# Patient Record
Sex: Female | Born: 1950 | Race: White | Hispanic: No | Marital: Married | Smoking: Former smoker
Health system: Southern US, Community
[De-identification: ages and names within clinical notes are randomized; demographics above are authoritative.]

## PROBLEM LIST (undated history)

## (undated) DIAGNOSIS — M1990 Unspecified osteoarthritis, unspecified site: Secondary | ICD-10-CM

## (undated) DIAGNOSIS — D689 Coagulation defect, unspecified: Secondary | ICD-10-CM

## (undated) DIAGNOSIS — R42 Dizziness and giddiness: Secondary | ICD-10-CM

## (undated) DIAGNOSIS — Z9889 Other specified postprocedural states: Secondary | ICD-10-CM

## (undated) DIAGNOSIS — D071 Carcinoma in situ of vulva: Secondary | ICD-10-CM

## (undated) DIAGNOSIS — E7212 Methylenetetrahydrofolate reductase deficiency: Secondary | ICD-10-CM

## (undated) DIAGNOSIS — F419 Anxiety disorder, unspecified: Secondary | ICD-10-CM

## (undated) DIAGNOSIS — I340 Nonrheumatic mitral (valve) insufficiency: Secondary | ICD-10-CM

## (undated) DIAGNOSIS — Z87898 Personal history of other specified conditions: Secondary | ICD-10-CM

## (undated) DIAGNOSIS — G4733 Obstructive sleep apnea (adult) (pediatric): Secondary | ICD-10-CM

## (undated) DIAGNOSIS — M329 Systemic lupus erythematosus, unspecified: Secondary | ICD-10-CM

## (undated) DIAGNOSIS — K449 Diaphragmatic hernia without obstruction or gangrene: Secondary | ICD-10-CM

## (undated) DIAGNOSIS — J449 Chronic obstructive pulmonary disease, unspecified: Secondary | ICD-10-CM

## (undated) DIAGNOSIS — J4520 Mild intermittent asthma, uncomplicated: Secondary | ICD-10-CM

## (undated) DIAGNOSIS — J3089 Other allergic rhinitis: Secondary | ICD-10-CM

## (undated) DIAGNOSIS — IMO0002 Reserved for concepts with insufficient information to code with codable children: Secondary | ICD-10-CM

## (undated) DIAGNOSIS — K5730 Diverticulosis of large intestine without perforation or abscess without bleeding: Secondary | ICD-10-CM

## (undated) DIAGNOSIS — R002 Palpitations: Secondary | ICD-10-CM

## (undated) DIAGNOSIS — R7303 Prediabetes: Secondary | ICD-10-CM

## (undated) DIAGNOSIS — Z860101 Personal history of adenomatous and serrated colon polyps: Secondary | ICD-10-CM

## (undated) DIAGNOSIS — E063 Autoimmune thyroiditis: Secondary | ICD-10-CM

## (undated) DIAGNOSIS — M79604 Pain in right leg: Secondary | ICD-10-CM

## (undated) DIAGNOSIS — Z1589 Genetic susceptibility to other disease: Secondary | ICD-10-CM

## (undated) DIAGNOSIS — Z86010 Personal history of colonic polyps: Secondary | ICD-10-CM

## (undated) DIAGNOSIS — I341 Nonrheumatic mitral (valve) prolapse: Secondary | ICD-10-CM

## (undated) DIAGNOSIS — Z8719 Personal history of other diseases of the digestive system: Secondary | ICD-10-CM

## (undated) DIAGNOSIS — M79605 Pain in left leg: Secondary | ICD-10-CM

## (undated) DIAGNOSIS — G4730 Sleep apnea, unspecified: Secondary | ICD-10-CM

## (undated) DIAGNOSIS — K219 Gastro-esophageal reflux disease without esophagitis: Secondary | ICD-10-CM

## (undated) DIAGNOSIS — N393 Stress incontinence (female) (male): Secondary | ICD-10-CM

## (undated) DIAGNOSIS — Z9189 Other specified personal risk factors, not elsewhere classified: Secondary | ICD-10-CM

## (undated) DIAGNOSIS — E785 Hyperlipidemia, unspecified: Secondary | ICD-10-CM

## (undated) DIAGNOSIS — N183 Chronic kidney disease, stage 3 unspecified: Secondary | ICD-10-CM

## (undated) DIAGNOSIS — D6861 Antiphospholipid syndrome: Secondary | ICD-10-CM

## (undated) DIAGNOSIS — Z973 Presence of spectacles and contact lenses: Secondary | ICD-10-CM

## (undated) DIAGNOSIS — E039 Hypothyroidism, unspecified: Secondary | ICD-10-CM

## (undated) HISTORY — DX: Sleep apnea, unspecified: G47.30

## (undated) HISTORY — DX: Pain in right leg: M79.605

## (undated) HISTORY — DX: Autoimmune thyroiditis: E06.3

## (undated) HISTORY — DX: Chronic kidney disease, stage 3 unspecified: N18.30

## (undated) HISTORY — DX: Genetic susceptibility to other disease: Z15.89

## (undated) HISTORY — DX: Gastro-esophageal reflux disease without esophagitis: K21.9

## (undated) HISTORY — DX: Hyperlipidemia, unspecified: E78.5

## (undated) HISTORY — DX: Dizziness and giddiness: R42

## (undated) HISTORY — DX: Anxiety disorder, unspecified: F41.9

## (undated) HISTORY — DX: Nonrheumatic mitral (valve) insufficiency: I34.0

## (undated) HISTORY — DX: Reserved for concepts with insufficient information to code with codable children: IMO0002

## (undated) HISTORY — DX: Palpitations: R00.2

## (undated) HISTORY — DX: Systemic lupus erythematosus, unspecified: M32.9

## (undated) HISTORY — DX: Pain in left leg: M79.604

## (undated) HISTORY — DX: Methylenetetrahydrofolate reductase deficiency: E72.12

## (undated) HISTORY — PX: OTHER SURGICAL HISTORY: SHX169

---

## 1993-02-08 HISTORY — PX: LAPAROSCOPIC CHOLECYSTECTOMY: SUR755

## 1997-03-26 ENCOUNTER — Ambulatory Visit (HOSPITAL_COMMUNITY): Admission: RE | Admit: 1997-03-26 | Discharge: 1997-03-26 | Payer: Self-pay | Admitting: Obstetrics and Gynecology

## 1997-09-23 ENCOUNTER — Ambulatory Visit: Admission: RE | Admit: 1997-09-23 | Discharge: 1997-09-23 | Payer: Self-pay | Admitting: Family Medicine

## 1998-04-28 ENCOUNTER — Other Ambulatory Visit: Admission: RE | Admit: 1998-04-28 | Discharge: 1998-04-28 | Payer: Self-pay | Admitting: Obstetrics and Gynecology

## 1998-04-29 ENCOUNTER — Encounter: Payer: Self-pay | Admitting: Family Medicine

## 1998-04-29 ENCOUNTER — Ambulatory Visit (HOSPITAL_COMMUNITY): Admission: RE | Admit: 1998-04-29 | Discharge: 1998-04-29 | Payer: Self-pay | Admitting: Family Medicine

## 1999-02-05 ENCOUNTER — Ambulatory Visit (HOSPITAL_COMMUNITY): Admission: RE | Admit: 1999-02-05 | Discharge: 1999-02-05 | Payer: Self-pay | Admitting: Family Medicine

## 1999-02-05 ENCOUNTER — Encounter: Payer: Self-pay | Admitting: Family Medicine

## 1999-07-31 ENCOUNTER — Other Ambulatory Visit: Admission: RE | Admit: 1999-07-31 | Discharge: 1999-07-31 | Payer: Self-pay | Admitting: Obstetrics & Gynecology

## 2000-01-20 ENCOUNTER — Encounter (INDEPENDENT_AMBULATORY_CARE_PROVIDER_SITE_OTHER): Payer: Self-pay | Admitting: Specialist

## 2000-01-20 ENCOUNTER — Other Ambulatory Visit: Admission: RE | Admit: 2000-01-20 | Discharge: 2000-01-20 | Payer: Self-pay | Admitting: Gastroenterology

## 2000-06-28 ENCOUNTER — Encounter: Payer: Self-pay | Admitting: Obstetrics & Gynecology

## 2000-06-28 ENCOUNTER — Ambulatory Visit (HOSPITAL_COMMUNITY): Admission: RE | Admit: 2000-06-28 | Discharge: 2000-06-28 | Payer: Self-pay | Admitting: Obstetrics & Gynecology

## 2000-08-23 ENCOUNTER — Other Ambulatory Visit: Admission: RE | Admit: 2000-08-23 | Discharge: 2000-08-23 | Payer: Self-pay | Admitting: Obstetrics & Gynecology

## 2001-07-05 ENCOUNTER — Ambulatory Visit (HOSPITAL_COMMUNITY): Admission: RE | Admit: 2001-07-05 | Discharge: 2001-07-05 | Payer: Self-pay | Admitting: Obstetrics & Gynecology

## 2001-07-05 ENCOUNTER — Encounter: Payer: Self-pay | Admitting: Obstetrics & Gynecology

## 2002-12-14 ENCOUNTER — Ambulatory Visit (HOSPITAL_COMMUNITY): Admission: RE | Admit: 2002-12-14 | Discharge: 2002-12-14 | Payer: Self-pay | Admitting: Family Medicine

## 2003-01-21 ENCOUNTER — Other Ambulatory Visit: Admission: RE | Admit: 2003-01-21 | Discharge: 2003-01-21 | Payer: Self-pay | Admitting: Obstetrics & Gynecology

## 2004-04-02 ENCOUNTER — Other Ambulatory Visit: Admission: RE | Admit: 2004-04-02 | Discharge: 2004-04-02 | Payer: Self-pay | Admitting: Obstetrics & Gynecology

## 2004-06-24 ENCOUNTER — Ambulatory Visit (HOSPITAL_COMMUNITY): Admission: RE | Admit: 2004-06-24 | Discharge: 2004-06-24 | Payer: Self-pay | Admitting: Endocrinology

## 2006-06-28 ENCOUNTER — Encounter: Admission: RE | Admit: 2006-06-28 | Discharge: 2006-06-28 | Payer: Self-pay | Admitting: Obstetrics & Gynecology

## 2006-10-01 ENCOUNTER — Encounter: Admission: RE | Admit: 2006-10-01 | Discharge: 2006-10-01 | Payer: Self-pay | Admitting: Endocrinology

## 2008-10-23 ENCOUNTER — Encounter (INDEPENDENT_AMBULATORY_CARE_PROVIDER_SITE_OTHER): Payer: Self-pay | Admitting: *Deleted

## 2008-11-05 ENCOUNTER — Ambulatory Visit: Payer: Self-pay | Admitting: Gastroenterology

## 2008-11-26 ENCOUNTER — Ambulatory Visit: Payer: Self-pay | Admitting: Gastroenterology

## 2008-11-26 ENCOUNTER — Encounter: Payer: Self-pay | Admitting: Gastroenterology

## 2008-11-28 ENCOUNTER — Encounter: Payer: Self-pay | Admitting: Gastroenterology

## 2009-09-13 ENCOUNTER — Encounter: Admission: RE | Admit: 2009-09-13 | Discharge: 2009-09-13 | Payer: Self-pay | Admitting: Family Medicine

## 2010-01-22 ENCOUNTER — Encounter (INDEPENDENT_AMBULATORY_CARE_PROVIDER_SITE_OTHER): Payer: Self-pay | Admitting: *Deleted

## 2010-03-02 ENCOUNTER — Other Ambulatory Visit: Payer: Self-pay | Admitting: Gastroenterology

## 2010-03-02 ENCOUNTER — Ambulatory Visit
Admission: RE | Admit: 2010-03-02 | Discharge: 2010-03-02 | Payer: Self-pay | Source: Home / Self Care | Attending: Gastroenterology | Admitting: Gastroenterology

## 2010-03-02 ENCOUNTER — Encounter: Payer: Self-pay | Admitting: Gastroenterology

## 2010-03-02 DIAGNOSIS — R142 Eructation: Secondary | ICD-10-CM

## 2010-03-02 DIAGNOSIS — R141 Gas pain: Secondary | ICD-10-CM | POA: Insufficient documentation

## 2010-03-02 DIAGNOSIS — R143 Flatulence: Secondary | ICD-10-CM

## 2010-03-02 DIAGNOSIS — K219 Gastro-esophageal reflux disease without esophagitis: Secondary | ICD-10-CM | POA: Insufficient documentation

## 2010-03-02 DIAGNOSIS — R197 Diarrhea, unspecified: Secondary | ICD-10-CM | POA: Insufficient documentation

## 2010-03-02 DIAGNOSIS — L259 Unspecified contact dermatitis, unspecified cause: Secondary | ICD-10-CM | POA: Insufficient documentation

## 2010-03-02 LAB — CBC WITH DIFFERENTIAL/PLATELET
Basophils Absolute: 0 10*3/uL (ref 0.0–0.1)
Eosinophils Absolute: 0.2 10*3/uL (ref 0.0–0.7)
HCT: 45.5 % (ref 36.0–46.0)
Hemoglobin: 15.5 g/dL — ABNORMAL HIGH (ref 12.0–15.0)
Lymphocytes Relative: 39.4 % (ref 12.0–46.0)
Lymphs Abs: 3.6 10*3/uL (ref 0.7–4.0)
MCHC: 34.2 g/dL (ref 30.0–36.0)
Neutro Abs: 4.4 10*3/uL (ref 1.4–7.7)
Platelets: 268 10*3/uL (ref 150.0–400.0)
RDW: 12 % (ref 11.5–14.6)

## 2010-03-02 LAB — HEPATIC FUNCTION PANEL
AST: 29 U/L (ref 0–37)
Alkaline Phosphatase: 79 U/L (ref 39–117)
Bilirubin, Direct: 0.1 mg/dL (ref 0.0–0.3)
Total Protein: 7.2 g/dL (ref 6.0–8.3)

## 2010-03-02 LAB — TSH: TSH: 2.63 u[IU]/mL (ref 0.35–5.50)

## 2010-03-02 LAB — BASIC METABOLIC PANEL
CO2: 26 mEq/L (ref 19–32)
Calcium: 9.1 mg/dL (ref 8.4–10.5)
Glucose, Bld: 90 mg/dL (ref 70–99)
Potassium: 4 mEq/L (ref 3.5–5.1)
Sodium: 136 mEq/L (ref 135–145)

## 2010-03-05 ENCOUNTER — Encounter: Payer: Self-pay | Admitting: Gastroenterology

## 2010-03-12 NOTE — Assessment & Plan Note (Signed)
Summary: DIARRHEA..JJ.   History of Present Illness Visit Type: Follow-up Visit Primary GI MD: Elie Goody MD [facility] Primary Provider: Moriarty @ [place] Requesting Provider: Marcelle Overlie, MD Chief Complaint: dysphagia, diarrhea, GERD, abdominal pain History of Present Illness:   Rebecca Carr has multiple gastrointestinal complaints. She states she has had problems with worsening urgent, loose, watery, nonbloody stools that began shortly after her colonoscopy in 2010 which showed diverticulosis and hyperplastic colon polyps. She now has several urgent bowel movements in the mornings. She has significant bloating, generalized abdominal discomfort, flatulence, and relates intermittent difficulty swallowing bread. She states this she was placed on at least 2 courses of antibiotics at some time during 2011, but no antibiotics in the past few months. She has also noted episodes of significant morning nausea and vomiting. She has a prior history of peptic stricture and underwent endoscopy with dilation in 2002. She has not been on acid medication for several years.  She also notes a recurrent rash under her breasts and in the midline of her back that her gynecologist felt was fungal.   GI Review of Systems    Reports abdominal pain, acid reflux, belching, bloating, dysphagia with solids, nausea, and  vomiting.     Location of  Abdominal pain: generalized.    Denies chest pain, dysphagia with liquids, heartburn, loss of appetite, vomiting blood, weight loss, and  weight gain.      Reports change in bowel habits and  diarrhea.     Denies anal fissure, black tarry stools, constipation, diverticulosis, fecal incontinence, heme positive stool, hemorrhoids, irritable bowel syndrome, jaundice, light color stool, liver problems, rectal bleeding, and  rectal pain.   Current Medications (verified): 1)  Levothyroxine Sodium 137 Mcg Tabs (Levothyroxine Sodium) .... Take One By Mouth Once Daily 2)  Aspirin  81 Mg Tbec (Aspirin) .... Take One By Mouth Once Daily 3)  Ipratropium Bromide 0.03 % Soln (Ipratropium Bromide) .Marland Kitchen.. 1 Puff Each Nostril Two Times A Day 4)  Boniva 150 Mg Tabs (Ibandronate Sodium) .... Once Monthly 5)  Simcor 500-40 Mg Xr24h-Tab (Niacin-Simvastatin) .... Once Daily  Allergies (verified): 1)  ! 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 Family History of Colon Cancer: father ? Family History of Pancreatic Cancer:? Family History of Diabetes:  Family History of Heart Disease:  Family History of Colon Polyps  Social History: Reviewed history and no changes required. Patient currently smokes. 1 ppd Illicit Drug Use - no Alcohol Use - yes 1 per day Married Occupation: Mining engineer Daily Caffeine Use 4 per day Smoking Status:  current Drug Use:  no  Review of Systems       The patient complains of allergy/sinus, change in vision, cough, headaches-new, heart rhythm changes, itching, muscle pains/cramps, skin rash, and swelling of feet/legs.         The pertinent positives and negatives are noted as above and in the HPI. All other ROS were reviewed and were negative.   Vital Signs:  Patient profile:   60 year old female Height:      67 inches Weight:      245.13 pounds BMI:     38.53 Pulse rate:   60 / minute Pulse rhythm:   regular BP sitting:   110 / 78  (right arm) Cuff size:  regular  Vitals Entered By: June McMurray CMA Duncan Dull) (March 02, 2010 3:59 PM)  Physical Exam  General:  Well developed, well nourished, no acute distress. obese.  obese.   Head:  Normocephalic and atraumatic. Eyes:  PERRLA, no icterus. Ears:  Normal auditory acuity. Mouth:  No deformity or lesions, dentition normal. Neck:   Supple; no masses or thyromegaly. Lungs:  Clear throughout to auscultation. Heart:  Regular rate and rhythm; no murmurs, rubs,  or bruits. Abdomen:  Soft, nontender and nondistended. No masses, hepatosplenomegaly or hernias noted. Normal bowel sounds. Msk:  Symmetrical with no gross deformities. Normal posture. Pulses:  Normal pulses noted. Extremities:  No clubbing, cyanosis, edema or deformities noted. Neurologic:  Alert and  oriented x4;  grossly normal neurologically. Skin:  Resolving macular papular midline rash on her back Cervical Nodes:  No significant cervical adenopathy. Inguinal Nodes:  No significant inguinal adenopathy. Psych:  Alert and cooperative. anxious.  anxious.    Impression & Recommendations:  Problem # 1:  GERD (ICD-530.81) Underlying GERD with a prior esophageal stricture, and erosive esophagitis, now with several symptoms likely attributable to uncontrolled reflux. Begin a proton pump inhibitor every morning, along with standard antireflux measures. Long-term weight-loss program advised.  Problem # 2:  DIARRHEA (ICD-787.91) Urgent morning, diarrhea. Rule out celiac disease, inflammatory bowel disease, irritable bowel syndrome, chronic intestinal infections, and other disorders. Blood work and stool studies negative, consider trial of anti-spasmodic. Consider colonoscopy with biopsies. Orders: TLB-BMP (Basic Metabolic Panel-BMET) (80048-METABOL) TLB-Hepatic/Liver Function Pnl (80076-HEPATIC) TLB-CBC Platelet - w/Differential (85025-CBCD) TLB-TSH (Thyroid Stimulating Hormone) (84443-TSH) TLB-IgA (Immunoglobulin A) (82784-IGA) T-Sprue Panel (Celiac Disease Aby Eval) (83516x3/[address]) T-Culture, Stool (87045/87046-70140) T-C diff by PCR (04540) T-Stool Fats [place] Stain [id number]) T-Stool Giardia / Crypto- EIA (95621) T-Stool for O&P (30865-78469) T-Fecal WBC (62952-84132)  Problem # 3:  DERMATITIS, CHRONIC (ICD-692.9) Etiology unclear. May need further  evaluation with a dermatologist.  Problem # 4:  FLATULENCE ERUCTATION AND GAS PAIN (ICD-787.3) Begin a low gas diet, and Gas-X q.i.d. p.r.n. Further plans as above.  Patient Instructions: 1)  Please go directly to the basement to have your labs drawn.  2)  Pick up your prescription for pantoprazole from your pharmacy.  3)  Avoid foods high in acid content ( tomatoes, citrus juices, spicy foods) . Avoid eating within 3 to 4 hours of lying down or before exercising. Do not over eat; try smaller more frequent meals. Elevate head of bed four inches when sleeping.  4)  Excessive Gas Diet handout given.  5)  Start Gas-X four times a day as needed. 6)  Please schedule a follow-up appointment in 3-4 weeks.  7)  Copy sent to : Marcelle Overlie, MD 8)  The medication list was reviewed and reconciled.  All changed / newly prescribed medications were explained.  A complete medication list was provided to the patient / caregiver.  Prescriptions: PANTOPRAZOLE SODIUM 40 MG TBEC (PANTOPRAZOLE SODIUM) one tablet by mouth once daily  #30 x 11   Entered by:   Christie Nottingham CMA (AAMA)   Authorized by:   Meryl Dare MD [facility]   Signed by:   Meryl Dare MD [facility] on 03/02/2010   Method used:   Electronically to        [facility]* (retail)       [address] [place], [place]  [address]       Ph: [phone]       Fax:  [phone]   RxID:   [id number]

## 2010-03-12 NOTE — Letter (Signed)
Summary: New Patient letter  [facility]  [address]   [place], [place] [address]   Phone: [phone]  Fax: [phone]       01/22/2010 MRN: [id number]  Rebecca Carr [address] [place], [place]  [address]  [place]  Dear Ms. Val,  Welcome to the Gastroenterology Division at [facility].    You are scheduled to see Dr.  Russella Dar  on 03/04/2010 at 3:30 on the 3rd floor at [facility], [address]  We ask that you try to arrive at our office 15 minutes prior to your appointment time to allow for check-in.  We would like you to complete the enclosed self-administered evaluation form prior to your visit and bring it with you on the day of your appointment.  We will review it with you.  Also, please bring a complete list of all your medications or, if you prefer, bring the medication bottles and we will list them.  Please bring your insurance card so that we may make a copy of it.  If your insurance requires a referral to see a specialist, please bring your referral form from your primary care physician.  Co-payments are due at the time of your visit and may be paid by cash, check or credit card.     Your office visit will consist of a consult with your physician (includes a physical exam), any laboratory testing he/she may order, scheduling of any necessary diagnostic testing (e.g. x-ray, ultrasound, CT-scan), and scheduling of a procedure (e.g. Endoscopy, Colonoscopy) if required.  Please allow enough time on your schedule to allow for any/all of these possibilities.    If you cannot keep your appointment, please call [phone] to cancel or reschedule prior to your appointment date.  This allows [place] the opportunity to schedule an appointment for another patient in need of care.  If you do not cancel or reschedule by 5 p.m. the business day prior to your appointment date, you will be charged a $50.00 late cancellation/no-show fee.    Thank you for  choosing [facility] for your medical needs.  We appreciate the opportunity to care for you.  Please visit [place] at our website  to learn more about our practice.                     Sincerely,                                                             The Gastroenterology Division

## 2010-11-09 HISTORY — PX: TRANSTHORACIC ECHOCARDIOGRAM: SHX275

## 2013-05-03 ENCOUNTER — Other Ambulatory Visit: Payer: Self-pay | Admitting: Obstetrics and Gynecology

## 2013-08-17 ENCOUNTER — Telehealth: Payer: Self-pay | Admitting: Gastroenterology

## 2013-08-17 NOTE — Telephone Encounter (Signed)
Patient with diarrhea, she does not want to wait until September to be seen with Dr. Fuller Plan.  She will come in and see Nicoletta Ba PA 08/28/13 10:00

## 2013-08-28 ENCOUNTER — Encounter: Payer: Self-pay | Admitting: Physician Assistant

## 2013-08-28 ENCOUNTER — Ambulatory Visit (INDEPENDENT_AMBULATORY_CARE_PROVIDER_SITE_OTHER): Payer: BC Managed Care – PPO | Admitting: Physician Assistant

## 2013-08-28 VITALS — BP 120/82 | HR 92 | Ht 67.75 in | Wt 249.0 lb

## 2013-08-28 DIAGNOSIS — R197 Diarrhea, unspecified: Secondary | ICD-10-CM

## 2013-08-28 DIAGNOSIS — K219 Gastro-esophageal reflux disease without esophagitis: Secondary | ICD-10-CM

## 2013-08-28 DIAGNOSIS — R490 Dysphonia: Secondary | ICD-10-CM

## 2013-08-28 DIAGNOSIS — Z86010 Personal history of colonic polyps: Secondary | ICD-10-CM

## 2013-08-28 MED ORDER — HYOSCYAMINE SULFATE 0.125 MG SL SUBL: 0.1250 mg | SUBLINGUAL_TABLET | SUBLINGUAL | Status: DC | PRN

## 2013-08-28 MED ORDER — MOVIPREP 100 G PO SOLR: 1.0000 | ORAL | Status: DC

## 2013-08-28 MED ORDER — FAMOTIDINE 40 MG PO TABS: ORAL_TABLET | ORAL | Status: DC

## 2013-08-28 NOTE — Progress Notes (Signed)
Subjective:    Patient ID: Rebecca Carr, female    DOB: August 04, 1950, 63 y.o.   MRN: [id number]  HPI;    Rebecca Carr is a pleasant 63 year old female known previously to Dr. Fuller Plan from colonoscopy done in 2010. She also reports that she had one other colonoscopy several years back. Colonoscopy in October of 2010 showed mild diverticulosis and she had 5 polyps removed all 3-5 mm in size and path consistent with hyperplastic polyps. She does have family history of colon cancer in her father and grandfather and was therefore scheduled for 5 year followup. She comes in today with new complaint of diarrhea which she says is been present over the past 5 months. She has diarrhea on a daily basis which is manifested with early morning urgency for  bowel movements to the point where she has had a couple of accidents. She says she has  2-3 episodes of diarrhea early in the morning and then generally is okay for the rest of the day. She has had some dark bowel movements and some very light bowel movements , no overt blood.. She says her stool is always liquid to loose. She's had no associated abdominal pain ,no bloating, no nausea or vomiting, no fever. Her appetite has been fine. No weight loss. No recent antibiotics. She had been on several supplements given to her by a Naturopath  for her thyroid but says she quit all of these at least 5 months ago. No specific triggers that she is aware of. She is also concerned about recurrent hoarseness. She had been on PPI therapy in the past and said that did seem to help but she has not been taking this for a long time because of concerns for osteoporosis. She says she does have very frequent episodes of belching and burping and what sounds like sour brash. No complaints of dysphagia.   Review of Systems  Constitutional: Positive for unexpected weight change.  HENT: Positive for voice change.   Eyes: Negative.   Respiratory: Negative.   Cardiovascular: Negative.     Gastrointestinal: Positive for diarrhea.  Endocrine: Negative.   Genitourinary: Negative.   Musculoskeletal: Positive for arthralgias.  Allergic/Immunologic: Negative.   Neurological: Negative.   Hematological: Negative.   Psychiatric/Behavioral: Negative.     Outpatient Encounter Prescriptions as of 08/28/2013  Medication Sig  . albuterol (PROVENTIL) (2.5 MG/3ML) 0.083% nebulizer solution Take 2.5 mg by nebulization every 6 (six) hours as needed for wheezing or shortness of breath.  Francia Greaves THYROID PO Take 1 tablet by mouth daily.  . fluticasone (FLONASE) 50 MCG/ACT nasal spray Place 1 spray into both nostrils daily.  . famotidine (PEPCID) 40 MG tablet Take 1 tab with dinner.  . hyoscyamine (LEVSIN/SL) 0.125 MG SL tablet Place 1 tablet (0.125 mg total) under the tongue every 4 (four) hours as needed.  Marland Kitchen MOVIPREP 100 G SOLR Take 1 kit (200 g total) by mouth as directed.   Allergies  Allergen Reactions  . Iodine     REACTION: anaphylactic shock  . Iohexol      Desc: Patient states she is allergic to iodine and had a "code blue" incident after an injection for some type of imaging study.    Patient Active Problem List   Diagnosis Date Noted  . GERD 03/02/2010  . DERMATITIS, CHRONIC 03/02/2010  . FLATULENCE ERUCTATION AND GAS PAIN 03/02/2010  . DIARRHEA 03/02/2010    History   Social History Narrative  . No narrative on file  family history includes COPD in her brother; Heart attack in her brother and mother; Pancreatic cancer in her father.  Objective:   Physical Exam  well-developed white female in no acute distress, blood pressure 120 over each 2 pulse 92 height 5 foot 7 weight 249, BMI 38. HEENT; nontraumatic normocephalic EOMI PERRLA sclera anicteric, Supple; no JVD, Cardiovascular; regular rate and rhythm with S1-S2 no murmur or gallop, Pulmonary; clear bilaterally, Abdomen ;soft basically nontender nondistended bowel sounds are active there is no palpable mass or  hepatosplenomegaly no guarding or rebound, Rectal; exam not done, Extremities; no clubbing cyanosis or edema skin warm and dry, Psych; mood and affect appropriate.        Assessment & Plan:  #19  63 year old female with new complaint of diarrhea at x5 months, etiology not clear diarrhea associated with urgency. Rule out new onset colitis, microscopic colitis or IBS #2 personal history of hyperplastic colon polyps, and adenomatous polyps -last colonoscopy 5 years ago #3 diverticulosis #4 positive family history of colon cancer #5 hoarseness suspect underlying GERD #6 persistent belching and sour brash  Plan; start Pepcid 40 mg by mouth daily Trial of Levsin sublingual every morning. Patient advised to take early in the morning shortly after getting up Schedule for colonoscopy with plans for random biopsies with Dr. Fuller Plan for further evaluation of her diarrhea and also because she is due for 5 year followup due to her family history. Procedure discussed in detail with the patient and she is agreeable to proceed Schedule for EGD with Dr. Fuller Plan. Procedure discussed in detail with the patient and she is agreeable to proceed.

## 2013-08-28 NOTE — Patient Instructions (Signed)
You have been scheduled for an endoscopy and colonoscopy. Please follow the written instructions given to you at your visit today. Please pick up your prep at the pharmacy within the next 1-3 days. [facility] [place]. If you use inhalers (even only as needed), please bring them with you on the day of your procedure. Your physician has requested that you go to www.startemmi.com and enter the access code given to you at your visit today. This web site gives a general overview about your procedure. However, you should still follow specific instructions given to you by our office regarding your preparation for the procedure.

## 2013-08-28 NOTE — Progress Notes (Signed)
Reviewed and agree with management plan.  Gradie Butrick T. Roselina Burgueno, MD FACG 

## 2013-09-20 ENCOUNTER — Encounter: Payer: Self-pay | Admitting: Gastroenterology

## 2013-09-25 ENCOUNTER — Telehealth: Payer: Self-pay | Admitting: Gastroenterology

## 2013-09-25 NOTE — Telephone Encounter (Signed)
Patient notified of recommendations. 

## 2013-09-25 NOTE — Telephone Encounter (Signed)
Patient has mitral valve prolapse and was concerned that she needed antibiotics prior to procedure. Told her this is not recommended for endo procedures. She also reports she has a sinus infection and is concerned about being on antibiotics and having the procedure. Informed patient that if she has fever, the procedures would need to be rescheduled. She just wants Dr. Fuller Plan to know she is going to be on antibiotics.

## 2013-09-25 NOTE — Telephone Encounter (Signed)
No antibiotics needed for MVP with her procedure. OK to be on antibiotics for a sinus infection for her procedure.

## 2013-10-04 ENCOUNTER — Encounter: Payer: Self-pay | Admitting: Gastroenterology

## 2013-10-04 ENCOUNTER — Encounter: Payer: BC Managed Care – PPO | Admitting: Gastroenterology

## 2013-10-04 ENCOUNTER — Ambulatory Visit (AMBULATORY_SURGERY_CENTER): Payer: BC Managed Care – PPO | Admitting: Gastroenterology

## 2013-10-04 ENCOUNTER — Other Ambulatory Visit: Payer: Self-pay | Admitting: Gastroenterology

## 2013-10-04 VITALS — BP 118/57 | HR 82 | Temp 98.5°F | Resp 24 | Ht 67.75 in | Wt 249.0 lb

## 2013-10-04 DIAGNOSIS — IMO0001 Reserved for inherently not codable concepts without codable children: Secondary | ICD-10-CM

## 2013-10-04 DIAGNOSIS — D125 Benign neoplasm of sigmoid colon: Secondary | ICD-10-CM

## 2013-10-04 DIAGNOSIS — K2970 Gastritis, unspecified, without bleeding: Secondary | ICD-10-CM

## 2013-10-04 DIAGNOSIS — R197 Diarrhea, unspecified: Secondary | ICD-10-CM

## 2013-10-04 DIAGNOSIS — D126 Benign neoplasm of colon, unspecified: Secondary | ICD-10-CM

## 2013-10-04 DIAGNOSIS — K2990 Gastroduodenitis, unspecified, without bleeding: Secondary | ICD-10-CM

## 2013-10-04 DIAGNOSIS — Z86010 Personal history of colonic polyps: Secondary | ICD-10-CM

## 2013-10-04 DIAGNOSIS — K219 Gastro-esophageal reflux disease without esophagitis: Secondary | ICD-10-CM

## 2013-10-04 DIAGNOSIS — Z800 Family history of malignant neoplasm of digestive organs: Secondary | ICD-10-CM

## 2013-10-04 HISTORY — PX: COLONOSCOPY, ESOPHAGOGASTRODUODENOSCOPY (EGD) AND ESOPHAGEAL DILATION: SHX5781

## 2013-10-04 MED ORDER — OMEPRAZOLE 40 MG PO CPDR: 40.0000 mg | DELAYED_RELEASE_CAPSULE | Freq: Every day | ORAL | Status: DC

## 2013-10-04 MED ORDER — SODIUM CHLORIDE 0.9 % IV SOLN: 500.0000 mL | INTRAVENOUS | Status: DC

## 2013-10-04 NOTE — Progress Notes (Signed)
Called to room to assist during endoscopic procedure.  Patient ID and intended procedure confirmed with present staff. Received instructions for my participation in the procedure from the performing physician.  

## 2013-10-04 NOTE — Progress Notes (Signed)
A/ox3 pleased with MAC, report to Celia RN 

## 2013-10-04 NOTE — Op Note (Signed)
[place]  [address] [place], [address]   COLONOSCOPY PROCEDURE REPORT  PATIENT: Rebecca Carr, Rebecca Carr  MR#: [id number] BIRTHDATE: 20-Apr-1950 , 63  yrs. old GENDER: Female ENDOSCOPIST: Ladene Artist, MD, [facility] PROCEDURE DATE:  10/04/2013 PROCEDURE:   Colonoscopy with biopsy First Screening Colonoscopy - Avg.  risk and is 50 yrs.  old or older - No.  Prior Negative Screening - Now for repeat screening. N/A  History of Adenoma - Now for follow-up colonoscopy & has been > or = to 3 yrs.  Yes hx of adenoma.  Has been 3 or more years since last colonoscopy.  Polyps Removed Today? Yes. ASA CLASS:   Class II INDICATIONS:chronic diarrhea, Patient's personal history of adenomatous colon polyps, and Patient's immediate family history of colon cancer. MEDICATIONS: MAC sedation, administered by CRNA and propofol (Diprivan) 300mg  IV DESCRIPTION OF PROCEDURE:   After the risks benefits and alternatives of the procedure were thoroughly explained, informed consent was obtained.  A digital rectal exam revealed no abnormalities of the rectum.   The LB [id number]  endoscope was introduced through the anus and advanced to the cecum, which was identified by both the appendix and ileocecal valve. No adverse events experienced.   The quality of the prep was good, using MoviPrep  The instrument was then slowly withdrawn as the colon was fully examined.  COLON FINDINGS: Moderate diverticulosis was noted in the descending colon and sigmoid colon.   A sessile polyp measuring 4 mm in size was found in the sigmoid colon.  A polypectomy was performed with cold forceps.  The resection was complete and the polyp tissue was completely retrieved.   The colon was otherwise normal.  There was no diverticulosis, inflammation, polyps or cancers unless previously stated. Random biopsies taken throughout the colon. Retroflexed views revealed no abnormalities. The time to cecum=1 minutes 39  seconds.  Withdrawal time=12 minutes 32 seconds.  The scope was withdrawn and the procedure completed. COMPLICATIONS: There were no complications.  ENDOSCOPIC IMPRESSION: 1.   Moderate diverticulosis was noted in the descending colon and sigmoid colon 2.   Sessile polyp measuring 4 mm in the sigmoid colon; polypectomy performed with cold forceps  RECOMMENDATIONS: 1.  Await pathology results 2.  Repeat Colonoscopy in 5 years.  eSigned:  Ladene Artist, MD, [facility] 10/04/2013 1:37 PM

## 2013-10-04 NOTE — Patient Instructions (Signed)
Discharge instructions given with verbal understanding. Handouts on polyps,gastritis and esophagitis. Resume previous medications. Office will call to schedule follow up appointment. YOU HAD AN ENDOSCOPIC PROCEDURE TODAY AT [facility]: Refer to the procedure report that was given to you for any specific questions about what was found during the examination.  If the procedure report does not answer your questions, please call your gastroenterologist to clarify.  If you requested that your care partner not be given the details of your procedure findings, then the procedure report has been included in a sealed envelope for you to review at your convenience later.  YOU SHOULD EXPECT: Some feelings of bloating in the abdomen. Passage of more gas than usual.  Walking can help get rid of the air that was put into your GI tract during the procedure and reduce the bloating. If you had a lower endoscopy (such as a colonoscopy or flexible sigmoidoscopy) you may notice spotting of blood in your stool or on the toilet paper. If you underwent a bowel prep for your procedure, then you may not have a normal bowel movement for a few days.  DIET: Your first meal following the procedure should be a light meal and then it is ok to progress to your normal diet.  A half-sandwich or bowl of soup is an example of a good first meal.  Heavy or fried foods are harder to digest and may make you feel nauseous or bloated.  Likewise meals heavy in dairy and vegetables can cause extra gas to form and this can also increase the bloating.  Drink plenty of fluids but you should avoid alcoholic beverages for 24 hours.  ACTIVITY: Your care partner should take you home directly after the procedure.  You should plan to take it easy, moving slowly for the rest of the day.  You can resume normal activity the day after the procedure however you should NOT DRIVE or use heavy machinery for 24 hours (because of the sedation  medicines used during the test).    SYMPTOMS TO REPORT IMMEDIATELY: A gastroenterologist can be reached at any hour.  During normal business hours, 8:30 AM to 5:00 PM Monday through Friday, call [phone].  After hours and on weekends, please call the GI answering service at [phone] who will take a message and have the physician on call contact you.   Following lower endoscopy (colonoscopy or flexible sigmoidoscopy):  Excessive amounts of blood in the stool  Significant tenderness or worsening of abdominal pains  Swelling of the abdomen that is new, acute  Fever of 100F or higher  Following upper endoscopy (EGD)  Vomiting of blood or coffee ground material  New chest pain or pain under the shoulder blades  Painful or persistently difficult swallowing  New shortness of breath  Fever of 100F or higher  Black, tarry-looking stools  FOLLOW UP: If any biopsies were taken you will be contacted by phone or by letter within the next 1-3 weeks.  Call your gastroenterologist if you have not heard about the biopsies in 3 weeks.  Our staff will call the home number listed on your records the next business day following your procedure to check on you and address any questions or concerns that you may have at that time regarding the information given to you following your procedure. This is a courtesy call and so if there is no answer at the home number and we have not heard from you through the emergency physician  on call, we will assume that you have returned to your regular daily activities without incident.  SIGNATURES/CONFIDENTIALITY: You and/or your care partner have signed paperwork which will be entered into your electronic medical record.  These signatures attest to the fact that that the information above on your After Visit Summary has been reviewed and is understood.  Full responsibility of the confidentiality of this discharge information lies with you and/or your  care-partner.

## 2013-10-04 NOTE — Op Note (Signed)
[place]  [address] [place], [id number]   ENDOSCOPY PROCEDURE REPORT  PATIENT: Rebecca Carr, Rebecca Carr  MR#: [id number] BIRTHDATE: [birth year] , 63  yrs. old GENDER: Female ENDOSCOPIST: Ladene Artist, MD, [facility] PROCEDURE DATE:  10/04/2013 PROCEDURE:  EGD w/ biopsy ASA CLASS:     Class II INDICATIONS:  History of esophageal reflux. MEDICATIONS: MAC sedation, administered by CRNA, There was residual sedation effect present from prior procedure, and propofol (Diprivan) 100mg  IV TOPICAL ANESTHETIC: none DESCRIPTION OF PROCEDURE: After the risks benefits and alternatives of the procedure were thoroughly explained, informed consent was obtained.  The LB [id number] endoscope was introduced through the mouth and advanced to the second portion of the duodenum. Without limitations.  The instrument was slowly withdrawn as the mucosa was fully examined.  ESOPHAGUS: There was LA Class B esophagitis noted.   A stricture was found at the gastroesophageal junction.  The stenosis was traversable with the endoscope.   The esophagus was otherwise normal. STOMACH: Mild nonerosive gastritis was found in the gastric antrum, gastric body, and gastric fundus.  Multiple biopsies were performed.   The stomach otherwise appeared normal. DUODENUM: Mild duodenal inflammation was found in the bulb and second portion of the duodenum.  Retroflexed views revealed a small hiatal hernia.     The scope was then withdrawn from the patient and the procedure completed.  COMPLICATIONS: There were no complications.  ENDOSCOPIC IMPRESSION: 1.   LA Class B esophagitis 2.   Stricture at the gastroesophageal junction 3.   Gastritis in the gastric antrum, gastric body, and gastric fundus; multiple biopsies 4.   Duodenitis in the bulb and second portion of the duodenum 5.   Small hiatal  RECOMMENDATIONS: 1.  Anti-reflux regimen long term 2.  PPI qam: omeprazole 40 mg po qam, 1 year of  refills 3.  Await pathology results 4.  Avoid NSAIDs 5.  Office appt with me or Amy Esterwood, PAC in 4-6 weeks  eSigned:  Ladene Artist, MD, [facility] 10/04/2013 1:47 PM

## 2013-10-05 ENCOUNTER — Telehealth: Payer: Self-pay | Admitting: *Deleted

## 2013-10-05 NOTE — Telephone Encounter (Signed)
  Follow up Call-  Call back number 10/04/2013  Post procedure Call Back phone  # [phone]  Permission to leave phone message Yes     No answer at # given.  Left message on voicemail.

## 2013-10-11 ENCOUNTER — Encounter: Payer: Self-pay | Admitting: Gastroenterology

## 2013-10-22 ENCOUNTER — Telehealth: Payer: Self-pay | Admitting: Gastroenterology

## 2013-10-22 DIAGNOSIS — K219 Gastro-esophageal reflux disease without esophagitis: Principal | ICD-10-CM

## 2013-10-22 DIAGNOSIS — IMO0001 Reserved for inherently not codable concepts without codable children: Secondary | ICD-10-CM

## 2013-10-22 MED ORDER — PANTOPRAZOLE SODIUM 40 MG PO TBEC: 40.0000 mg | DELAYED_RELEASE_TABLET | Freq: Every day | ORAL | Status: DC

## 2013-10-22 NOTE — Telephone Encounter (Signed)
Patient reports indigestion since starting on omeprazole.  Will try pantoprazole.  She will call back for additional questions or concerns

## 2013-11-19 ENCOUNTER — Encounter: Payer: Self-pay | Admitting: Gastroenterology

## 2013-11-19 ENCOUNTER — Ambulatory Visit (INDEPENDENT_AMBULATORY_CARE_PROVIDER_SITE_OTHER): Payer: BC Managed Care – PPO | Admitting: Gastroenterology

## 2013-11-19 VITALS — BP 108/80 | HR 80 | Ht 67.5 in | Wt 251.0 lb

## 2013-11-19 DIAGNOSIS — K2100 Gastro-esophageal reflux disease with esophagitis, without bleeding: Secondary | ICD-10-CM

## 2013-11-19 DIAGNOSIS — Z86010 Personal history of colonic polyps: Secondary | ICD-10-CM

## 2013-11-19 DIAGNOSIS — R490 Dysphonia: Secondary | ICD-10-CM

## 2013-11-19 NOTE — Progress Notes (Signed)
    History of Present Illness: This is a 63 year old female returning for followup of erosive esophagitis. She underwent colonoscopy and upper endoscopy in late August. Procedures reviewed. She has had a few episodes of reflux and regurgitation and feels omeprazole once daily is controlling his symptoms. She has significant problems with sinus congestion, sneezing, coughing, allergies and hoarseness. She feels her hoarseness improves when her sinus symptoms and allergies are under better control.  Current Medications, Allergies, Past Medical History, Past Surgical History, Family History and Social History were reviewed in [employer] record.  Physical Exam: General: Well developed , well nourished, no acute distress Head: Normocephalic and atraumatic Eyes:  sclerae anicteric, EOMI Ears: Normal auditory acuity Mouth: No deformity or lesions Lungs: Clear throughout to auscultation Heart: Regular rate and rhythm; no murmurs, rubs or bruits Abdomen: Soft, non tender and non distended. No masses, hepatosplenomegaly or hernias noted. Normal Bowel sounds Musculoskeletal: Symmetrical with no gross deformities  Pulses:  Normal pulses noted Extremities: No clubbing, cyanosis, edema or deformities noted Neurological: Alert oriented x 4, grossly nonfocal Psychological:  Alert and cooperative. Normal mood and affect  Assessment and Recommendations:  1. LA Class Grade B erosive esophagitis. Continue omeprazole 40 mg daily and standard antireflux measures. I asked her to contact her primary physician about an ENT and/or allergy referral. If her hoarseness persists despite improved management of her sinus and allergy symptoms consider increasing her PPIs twice daily.    2. Personal history of adenomatous colon polyps. 5 year surveillance colonoscopy.

## 2013-11-19 NOTE — Patient Instructions (Addendum)
You have been given a separate informational sheet regarding your tobacco use, the importance of quitting and local resources to help you quit.  As discussed with Dr. Fuller Plan, please contact your primary care doctor about a referral to an ENT.  Some of our suggestions include Dr. Janace Hoard and Dr. Constance Holster at [facility].  You have been given some anti reflux precaution information.  Please follow up with Dr. Fuller Plan in one year  cc:  Betty Martinique, MD

## 2014-05-14 ENCOUNTER — Other Ambulatory Visit: Payer: Self-pay | Admitting: Obstetrics and Gynecology

## 2014-05-16 LAB — CYTOLOGY - PAP

## 2014-07-05 ENCOUNTER — Encounter: Payer: Self-pay | Admitting: Gastroenterology

## 2014-10-04 ENCOUNTER — Other Ambulatory Visit: Payer: Self-pay | Admitting: Gastroenterology

## 2015-01-06 ENCOUNTER — Other Ambulatory Visit: Payer: Self-pay | Admitting: Gastroenterology

## 2015-05-31 ENCOUNTER — Other Ambulatory Visit: Payer: Self-pay | Admitting: Family Medicine

## 2015-06-30 ENCOUNTER — Ambulatory Visit: Payer: BLUE CROSS/BLUE SHIELD | Attending: Gynecologic Oncology | Admitting: Gynecologic Oncology

## 2015-06-30 ENCOUNTER — Encounter: Payer: Self-pay | Admitting: Gynecologic Oncology

## 2015-06-30 ENCOUNTER — Encounter (HOSPITAL_BASED_OUTPATIENT_CLINIC_OR_DEPARTMENT_OTHER): Payer: Self-pay | Admitting: *Deleted

## 2015-06-30 VITALS — BP 118/59 | HR 80 | Temp 98.0°F | Resp 17 | Ht 67.75 in | Wt 223.4 lb

## 2015-06-30 DIAGNOSIS — Z880 Allergy status to penicillin: Secondary | ICD-10-CM | POA: Insufficient documentation

## 2015-06-30 DIAGNOSIS — D071 Carcinoma in situ of vulva: Secondary | ICD-10-CM | POA: Diagnosis not present

## 2015-06-30 DIAGNOSIS — K222 Esophageal obstruction: Secondary | ICD-10-CM | POA: Diagnosis not present

## 2015-06-30 DIAGNOSIS — Z87891 Personal history of nicotine dependence: Secondary | ICD-10-CM | POA: Diagnosis not present

## 2015-06-30 DIAGNOSIS — K209 Esophagitis, unspecified: Secondary | ICD-10-CM | POA: Insufficient documentation

## 2015-06-30 DIAGNOSIS — Z9049 Acquired absence of other specified parts of digestive tract: Secondary | ICD-10-CM | POA: Diagnosis not present

## 2015-06-30 DIAGNOSIS — D497 Neoplasm of unspecified behavior of endocrine glands and other parts of nervous system: Secondary | ICD-10-CM | POA: Diagnosis not present

## 2015-06-30 DIAGNOSIS — F419 Anxiety disorder, unspecified: Secondary | ICD-10-CM | POA: Insufficient documentation

## 2015-06-30 DIAGNOSIS — E785 Hyperlipidemia, unspecified: Secondary | ICD-10-CM | POA: Insufficient documentation

## 2015-06-30 DIAGNOSIS — Z9189 Other specified personal risk factors, not elsewhere classified: Secondary | ICD-10-CM

## 2015-06-30 DIAGNOSIS — G4730 Sleep apnea, unspecified: Secondary | ICD-10-CM | POA: Diagnosis not present

## 2015-06-30 DIAGNOSIS — J45909 Unspecified asthma, uncomplicated: Secondary | ICD-10-CM | POA: Diagnosis not present

## 2015-06-30 DIAGNOSIS — Z91B Personal risk factor of exposure to diethylstilbestrol: Secondary | ICD-10-CM | POA: Insufficient documentation

## 2015-06-30 DIAGNOSIS — Z7982 Long term (current) use of aspirin: Secondary | ICD-10-CM | POA: Insufficient documentation

## 2015-06-30 DIAGNOSIS — E039 Hypothyroidism, unspecified: Secondary | ICD-10-CM | POA: Insufficient documentation

## 2015-06-30 DIAGNOSIS — D649 Anemia, unspecified: Secondary | ICD-10-CM | POA: Insufficient documentation

## 2015-06-30 DIAGNOSIS — K219 Gastro-esophageal reflux disease without esophagitis: Secondary | ICD-10-CM | POA: Diagnosis not present

## 2015-06-30 NOTE — Progress Notes (Signed)
NPO AFTER MN. ARRIVE AT 0915.  NEEDS ISTAT AND EKG.  WILL TAKE ARMOUR THYROID AM DOS W/ SIPS OF WATER.

## 2015-06-30 NOTE — Progress Notes (Signed)
Consult Note: Gyn-Onc  Consult was requested by Dr. Helane Rima for the evaluation of Rebecca Carr 65 y.o. female  CC:  Chief Complaint  Patient presents with  . VIN3    Assessment/Plan:  Ms. Rebecca Carr  is a 65 y.o.  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  I discussed with Edd Fabian that the pathology is consistent with a premalignant lesion and that there are no apparent sites more concerning for invasive disease.  I discussed that VIN3 can be treated either with excision or ablation. Ablation runs the risk of missed occult invasive disease as it does not produce pathology. However, ablation allows for sparing of vital structures (in this case clitoris).  After hearing about her options she has elected for CO2 laser of the vulva. We discussed the procedure, its risks and postop care and recovery.  I discussed that there is a 25% risk for recurrence after treatment of VIN3.  She requires annual surveillance for DES exposure and increased risk for adenocarcinoma and clear cell of the upper vagina and cervix.   HPI: Rebecca Carr is a 65 year old G1P1 who is seen in consultation at the request of Dr Helane Rima for [facility].    The patient first noted vulvar pruritis and pain at the clitoris in 2015. She discussed this with Dr Helane Rima who thought she might have vulvar atrophy and prescribed a cream (possibly premarin). One year later at her annual visit she had persistent symptoms that she mentioned, and Dr Helane Rima felt this might be vulvar candidiasis and prescribed a course of anti-fungal. This did not ameliorate the symptoms. 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  The patient has a history of tobacco abuse, but quit 1 year  ago.  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   She takes a baby aspirin daily.   Current Meds:  Outpatient Encounter Prescriptions as of 06/30/2015  Medication Sig  . aspirin 81 MG chewable tablet Chew 81 mg by mouth.  Marland Kitchen atorvastatin (LIPITOR) 40 MG tablet Take 40 mg by mouth daily.  Marland Kitchen azelastine (ASTELIN) 0.1 % nasal spray Place 2 sprays into both nostrils daily. Use in each nostril as directed  . fluticasone (FLONASE) 50 MCG/ACT nasal spray Place 1 spray into both nostrils daily.  . nicotine polacrilex (NICORETTE) 4 MG gum Take 4 mg by mouth as needed for smoking cessation.  Marland Kitchen omeprazole (PRILOSEC) 40 MG capsule TAKE ONE CAPSULE BY MOUTH DAILY BEFORE BREAKFAST  . thyroid (ARMOUR THYROID) 120 MG tablet Take 1 tablet (120 mg total) by mouth daily. Appt with Labs needed for refills.  . [DISCONTINUED] ARMOUR THYROID PO Take 120 mg by mouth daily.    No facility-administered encounter medications on file as of 06/30/2015.    Allergy:  Allergies  Allergen Reactions  . Iodine     REACTION: anaphylactic shock  . Iohexol      Desc: Patient states she is allergic to iodine and had a "code blue" incident after an injection for some type of imaging study.   . Penicillins Rash    PCN IN LARGE DOSES.    Social Hx:   Social History   Social History  .  Marital Status: Married    Spouse Name: N/A  . Number of Children: N/A  . Years of Education: N/A   Occupational History  . Not on file.   Social History Main Topics  . Smoking status: Former Smoker -- 0.50 packs/day    Types: Cigarettes    Quit date: 05/31/2014  . Smokeless tobacco: Never Used     Comment: using nicorrette  . Alcohol Use: Yes     Comment: 1 beer a day  . Drug Use: No  . Sexual Activity: Not on file   Other Topics Concern  . Not on file   Social History Narrative    Past Surgical Hx:  Past Surgical History  Procedure Laterality Date  . C sectioin    .  Cholecystectomy    . Tonsillectomy    . Upper gastrointestinal endoscopy      Past Medical Hx:  Past Medical History  Diagnosis Date  . Hyperlipidemia   . Hypothyroid   . MTHFR mutation ([place])   . 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 Asthma   . Allergy     DUST MITES, HONEY BEES  . Anemia   . Anxiety   . GERD (gastroesophageal reflux disease)   . Sleep apnea   . Esophageal stricture   . Esophagitis     LA class B  . Tubular adenoma of colon 09/2013    Past Gynecological History:  Cesarean section x 1. DES exposure in utero.  No LMP recorded. Patient is postmenopausal.  Family Hx:  Family History  Problem Relation Age of Onset  . Pancreatic cancer Father   . Heart attack Mother   . Heart disease Mother   . Heart attack Brother   . COPD Brother   . Colon cancer Father   . Colon cancer Maternal Grandfather     Review of Systems:  Constitutional  Feels well,    ENT Normal appearing ears and nares bilaterally Skin/Breast  No rash, sores, jaundice, itching, dryness Cardiovascular  No chest pain, shortness of breath, or edema  Pulmonary  No cough or wheeze.  Gastro Intestinal  No nausea, vomitting, or diarrhoea. No bright red blood per rectum, no abdominal pain, change in bowel movement, or constipation.  Genito Urinary  No frequency, urgency, dysuria, + vulvar pruritis Musculo Skeletal  No myalgia, arthralgia, joint swelling or pain  Neurologic  No weakness, numbness, change in gait,  Psychology  No depression, anxiety, insomnia.   Vitals:  Blood pressure 118/59, pulse 80, temperature 98 F (36.7 C), temperature source Oral, resp. rate 17, height 5' 7.75" (1.721 m), weight 223 lb 6.4 oz (101.334 kg), SpO2 98 %.  Physical Exam: WD in NAD Neck  Supple NROM, without any enlargements.  Lymph Node Survey No cervical supraclavicular or inguinal adenopathy Cardiovascular  Pulse normal rate, regularity and rhythm. S1 and S2 normal.  Lungs  Clear to auscultation  bilateraly, without wheezes/crackles/rhonchi. Good air movement.  Skin  No rash/lesions/breakdown  Psychiatry  Alert and oriented to person, place, and time  Abdomen  Normoactive bowel sounds, abdomen soft, non-tender and nonobese without evidence of hernia.  Back No CVA tenderness Genito Urinary  Vulva/vagina: 4% acetic acid applied. 6cm area of acetowhite change to the anterior labia minora and majora extending across the midline to the clitoris. Involves entire clitoris.  Bladder/urethra:  No lesions or masses, well supported bladder  Vagina: normal  Cervix: Normal appearing, no lesions.  Uterus:  Small, mobile, no parametrial involvement or nodularity.  Adnexa: no palpable masses. Rectal  deferred Extremities  No bilateral cyanosis, clubbing or edema.   Donaciano Eva, MD  06/30/2015, 9:51 AM

## 2015-06-30 NOTE — Patient Instructions (Signed)
Preparing for your Surgery  Plan for surgery on May 25 with Dr. Everitt Amber at the [facility].  You will be scheduled for a CO2 laser of the vulva.    Pre-operative Testing -You will receive a phone call from presurgical RN at [facility] to discuss instructions.  -STOP ASPIRIN NOW.  You should not be taking blood thinners or aspirin at least ten days prior to surgery unless instructed by your surgeon.  Day Before Surgery at [place] will be advised to have nothing to eat or drink after midnight the evening before.    Your role in recovery Your role is to become active as soon as directed by your doctor, while still giving yourself time to heal.  Rest when you feel tired. You will be asked to do the following in order to speed your recovery:

## 2015-07-02 ENCOUNTER — Telehealth: Payer: Self-pay | Admitting: Gynecologic Oncology

## 2015-07-02 NOTE — Telephone Encounter (Signed)
Patient called the office and left a message asking if she could go ahead and get her prescriptions sent in today because she "has a 'druggie' son at home and do not want him to know about the pain medicine."  Returned call to patient and left messages on her work and cell number asking her to please call the office to discuss her request.

## 2015-07-03 ENCOUNTER — Encounter (HOSPITAL_BASED_OUTPATIENT_CLINIC_OR_DEPARTMENT_OTHER)
Admission: RE | Disposition: A | Discharge status: Home / Self Care | Payer: Self-pay | Source: Ambulatory Visit | Attending: Gynecologic Oncology

## 2015-07-03 ENCOUNTER — Ambulatory Visit (HOSPITAL_BASED_OUTPATIENT_CLINIC_OR_DEPARTMENT_OTHER): Payer: BLUE CROSS/BLUE SHIELD | Admitting: Anesthesiology

## 2015-07-03 ENCOUNTER — Ambulatory Visit (HOSPITAL_BASED_OUTPATIENT_CLINIC_OR_DEPARTMENT_OTHER)
Admission: RE | Admit: 2015-07-03 | Discharge: 2015-07-03 | Disposition: A | Discharge status: Home / Self Care | Payer: BLUE CROSS/BLUE SHIELD | Source: Ambulatory Visit | Attending: Gynecologic Oncology | Admitting: Gynecologic Oncology

## 2015-07-03 ENCOUNTER — Encounter (HOSPITAL_BASED_OUTPATIENT_CLINIC_OR_DEPARTMENT_OTHER): Payer: Self-pay | Admitting: Anesthesiology

## 2015-07-03 DIAGNOSIS — G4730 Sleep apnea, unspecified: Secondary | ICD-10-CM | POA: Insufficient documentation

## 2015-07-03 DIAGNOSIS — Z7951 Long term (current) use of inhaled steroids: Secondary | ICD-10-CM | POA: Insufficient documentation

## 2015-07-03 DIAGNOSIS — K219 Gastro-esophageal reflux disease without esophagitis: Secondary | ICD-10-CM | POA: Insufficient documentation

## 2015-07-03 DIAGNOSIS — Z7982 Long term (current) use of aspirin: Secondary | ICD-10-CM | POA: Insufficient documentation

## 2015-07-03 DIAGNOSIS — E785 Hyperlipidemia, unspecified: Secondary | ICD-10-CM | POA: Diagnosis not present

## 2015-07-03 DIAGNOSIS — D071 Carcinoma in situ of vulva: Secondary | ICD-10-CM | POA: Diagnosis present

## 2015-07-03 DIAGNOSIS — Z79899 Other long term (current) drug therapy: Secondary | ICD-10-CM | POA: Insufficient documentation

## 2015-07-03 DIAGNOSIS — K449 Diaphragmatic hernia without obstruction or gangrene: Secondary | ICD-10-CM | POA: Insufficient documentation

## 2015-07-03 DIAGNOSIS — Z87891 Personal history of nicotine dependence: Secondary | ICD-10-CM | POA: Insufficient documentation

## 2015-07-03 DIAGNOSIS — E039 Hypothyroidism, unspecified: Secondary | ICD-10-CM | POA: Insufficient documentation

## 2015-07-03 DIAGNOSIS — Z9189 Other specified personal risk factors, not elsewhere classified: Secondary | ICD-10-CM | POA: Diagnosis not present

## 2015-07-03 HISTORY — DX: Unspecified osteoarthritis, unspecified site: M19.90

## 2015-07-03 HISTORY — PX: CO2 LASER APPLICATION: SHX5778

## 2015-07-03 HISTORY — DX: Personal history of colonic polyps: Z86.010

## 2015-07-03 HISTORY — DX: Nonrheumatic mitral (valve) prolapse: I34.1

## 2015-07-03 HISTORY — DX: Obstructive sleep apnea (adult) (pediatric): G47.33

## 2015-07-03 HISTORY — DX: Hypothyroidism, unspecified: E03.9

## 2015-07-03 HISTORY — DX: Presence of spectacles and contact lenses: Z97.3

## 2015-07-03 HISTORY — DX: Personal history of other diseases of the digestive system: Z87.19

## 2015-07-03 HISTORY — DX: Prediabetes: R73.03

## 2015-07-03 HISTORY — DX: Carcinoma in situ of vulva: D07.1

## 2015-07-03 HISTORY — PX: LASER ABLATION CONDOLAMATA: SHX5941

## 2015-07-03 HISTORY — DX: Other specified postprocedural states: Z98.890

## 2015-07-03 HISTORY — DX: Personal history of adenomatous and serrated colon polyps: Z86.0101

## 2015-07-03 HISTORY — DX: Other specified personal risk factors, not elsewhere classified: Z91.89

## 2015-07-03 HISTORY — DX: Diverticulosis of large intestine without perforation or abscess without bleeding: K57.30

## 2015-07-03 HISTORY — DX: Stress incontinence (female) (male): N39.3

## 2015-07-03 HISTORY — DX: Mild intermittent asthma, uncomplicated: J45.20

## 2015-07-03 HISTORY — DX: Other allergic rhinitis: J30.89

## 2015-07-03 HISTORY — DX: Personal history of other specified conditions: Z87.898

## 2015-07-03 HISTORY — DX: Diaphragmatic hernia without obstruction or gangrene: K44.9

## 2015-07-03 LAB — POCT I-STAT, CHEM 8
BUN: 17 mg/dL (ref 6–20)
Calcium, Ion: 1.25 mmol/L (ref 1.13–1.30)
Chloride: 104 mmol/L (ref 101–111)
Creatinine, Ser: 0.7 mg/dL (ref 0.44–1.00)
GLUCOSE: 103 mg/dL — AB (ref 65–99)
HEMATOCRIT: 43 % (ref 36.0–46.0)
HEMOGLOBIN: 14.6 g/dL (ref 12.0–15.0)
POTASSIUM: 4 mmol/L (ref 3.5–5.1)
Sodium: 143 mmol/L (ref 135–145)
TCO2: 25 mmol/L (ref 0–100)

## 2015-07-03 SURGERY — ABLATION, CONDYLOMA, USING LASER
Anesthesia: General

## 2015-07-03 MED ORDER — DEXAMETHASONE SODIUM PHOSPHATE 4 MG/ML IJ SOLN
INTRAMUSCULAR | Status: DC | PRN
  Administered 2015-07-03: 10 mg via INTRAVENOUS

## 2015-07-03 MED ORDER — MIDAZOLAM HCL 2 MG/2ML IJ SOLN
INTRAMUSCULAR | Status: AC
  Filled 2015-07-03: qty 2

## 2015-07-03 MED ORDER — PROPOFOL 10 MG/ML IV BOLUS
INTRAVENOUS | Status: DC | PRN
  Administered 2015-07-03: 180 mg via INTRAVENOUS

## 2015-07-03 MED ORDER — ONDANSETRON HCL 4 MG/2ML IJ SOLN
INTRAMUSCULAR | Status: AC
  Filled 2015-07-03: qty 2

## 2015-07-03 MED ORDER — LACTATED RINGERS IV SOLN
INTRAVENOUS | Status: DC
  Administered 2015-07-03 (×2): via INTRAVENOUS
  Filled 2015-07-03: qty 1000

## 2015-07-03 MED ORDER — PROMETHAZINE HCL 25 MG/ML IJ SOLN
6.2500 mg | INTRAMUSCULAR | Status: DC | PRN
  Filled 2015-07-03: qty 1

## 2015-07-03 MED ORDER — ONDANSETRON HCL 4 MG/2ML IJ SOLN
INTRAMUSCULAR | Status: DC | PRN
  Administered 2015-07-03: 4 mg via INTRAVENOUS

## 2015-07-03 MED ORDER — SUCCINYLCHOLINE CHLORIDE 20 MG/ML IJ SOLN
INTRAMUSCULAR | Status: DC | PRN
  Administered 2015-07-03: 100 mg via INTRAVENOUS

## 2015-07-03 MED ORDER — SILVER SULFADIAZINE 1 % EX CREA
TOPICAL_CREAM | CUTANEOUS | Status: DC | PRN
  Administered 2015-07-03: 1 via TOPICAL

## 2015-07-03 MED ORDER — FENTANYL CITRATE (PF) 100 MCG/2ML IJ SOLN
INTRAMUSCULAR | Status: AC
  Filled 2015-07-03: qty 2

## 2015-07-03 MED ORDER — LIDOCAINE HCL 1 % IJ SOLN
INTRAMUSCULAR | Status: DC | PRN
  Administered 2015-07-03: 10 mL

## 2015-07-03 MED ORDER — ACETIC ACID 5 % SOLN
Status: DC | PRN
  Administered 2015-07-03: 1 via TOPICAL

## 2015-07-03 MED ORDER — HYDROMORPHONE HCL 1 MG/ML IJ SOLN
0.2500 mg | INTRAMUSCULAR | Status: DC | PRN
  Administered 2015-07-03 (×2): 0.5 mg via INTRAVENOUS
  Filled 2015-07-03: qty 1

## 2015-07-03 MED ORDER — DOCUSATE SODIUM 100 MG PO CAPS: 100.0000 mg | ORAL_CAPSULE | Freq: Two times a day (BID) | ORAL | Status: DC

## 2015-07-03 MED ORDER — MIDAZOLAM HCL 5 MG/5ML IJ SOLN
INTRAMUSCULAR | Status: DC | PRN
  Administered 2015-07-03: 2 mg via INTRAVENOUS

## 2015-07-03 MED ORDER — FENTANYL CITRATE (PF) 100 MCG/2ML IJ SOLN
INTRAMUSCULAR | Status: DC | PRN
  Administered 2015-07-03: 100 ug via INTRAVENOUS

## 2015-07-03 MED ORDER — HYDROMORPHONE HCL 1 MG/ML IJ SOLN
INTRAMUSCULAR | Status: AC
  Filled 2015-07-03: qty 1

## 2015-07-03 MED ORDER — OXYCODONE-ACETAMINOPHEN 5-325 MG PO TABS: 1.0000 | ORAL_TABLET | ORAL | Status: DC | PRN

## 2015-07-03 MED ORDER — LIDOCAINE 2% (20 MG/ML) 5 ML SYRINGE
INTRAMUSCULAR | Status: DC | PRN
  Administered 2015-07-03: 80 mg via INTRAVENOUS

## 2015-07-03 MED ORDER — DEXAMETHASONE SODIUM PHOSPHATE 10 MG/ML IJ SOLN
INTRAMUSCULAR | Status: AC
  Filled 2015-07-03: qty 1

## 2015-07-03 MED ORDER — LIDOCAINE-PRILOCAINE 2.5-2.5 % EX CREA
TOPICAL_CREAM | CUTANEOUS | Status: AC
  Filled 2015-07-03: qty 5

## 2015-07-03 MED FILL — OXYCODONE/APAP 5/325 MG TAB: 5-325 | 5 days supply | Qty: 50 | Fill #0

## 2015-07-03 SURGICAL SUPPLY — 65 items
APPLICATOR COTTON TIP 6IN STRL (MISCELLANEOUS) ×4 IMPLANT
BAG URINE DRAINAGE (UROLOGICAL SUPPLIES) IMPLANT
BAG URINE LEG 19OZ MD ST LTX (BAG) IMPLANT
BLADE CLIPPER SURG (BLADE) ×2 IMPLANT
BLADE SURG 15 STRL LF DISP TIS (BLADE) ×1 IMPLANT
BLADE SURG 15 STRL SS (BLADE) ×1
BRIEF STRETCH FOR OB PAD LRG (UNDERPADS AND DIAPERS) ×2 IMPLANT
CANISTER SUCTION 1200CC (MISCELLANEOUS) IMPLANT
CANISTER SUCTION 2500CC (MISCELLANEOUS) ×2 IMPLANT
CATH FOLEY 2WAY SLVR  5CC 14FR (CATHETERS)
CATH FOLEY 2WAY SLVR  5CC 16FR (CATHETERS)
CATH FOLEY 2WAY SLVR 5CC 14FR (CATHETERS) IMPLANT
CATH FOLEY 2WAY SLVR 5CC 16FR (CATHETERS) IMPLANT
CATH ROBINSON RED A/P 14FR (CATHETERS) ×2 IMPLANT
CATH ROBINSON RED A/P 16FR (CATHETERS) IMPLANT
CLOTH BEACON ORANGE TIMEOUT ST (SAFETY) ×2 IMPLANT
COVER BACK TABLE 60X90IN (DRAPES) ×2 IMPLANT
COVER TABLE BACK 60X90 (DRAPES) ×2 IMPLANT
DEPRESSOR TONGUE BLADE STERILE (MISCELLANEOUS) ×2 IMPLANT
DRAPE LG THREE QUARTER DISP (DRAPES) ×4 IMPLANT
DRAPE UNDERBUTTOCKS STRL (DRAPE) ×2 IMPLANT
DRSG TELFA 3X8 NADH (GAUZE/BANDAGES/DRESSINGS) IMPLANT
ELECT BALL LEEP 3MM BLK (ELECTRODE) IMPLANT
ELECT REM PT RETURN 9FT ADLT (ELECTROSURGICAL) ×2
ELECTRODE REM PT RTRN 9FT ADLT (ELECTROSURGICAL) ×1 IMPLANT
GAUZE SPONGE 4X4 12PLY STRL (GAUZE/BANDAGES/DRESSINGS) IMPLANT
GAUZE SPONGE 4X4 16PLY XRAY LF (GAUZE/BANDAGES/DRESSINGS) IMPLANT
GLOVE BIO SURGEON STRL SZ 6 (GLOVE) ×4 IMPLANT
GOWN STRL REUS W/ TWL LRG LVL3 (GOWN DISPOSABLE) ×2 IMPLANT
GOWN STRL REUS W/TWL LRG LVL3 (GOWN DISPOSABLE) ×2
KIT ROOM TURNOVER WOR (KITS) ×2 IMPLANT
LEGGING LITHOTOMY PAIR STRL (DRAPES) ×2 IMPLANT
MANIFOLD NEPTUNE II (INSTRUMENTS) IMPLANT
NEEDLE HYPO 22GX1.5 SAFETY (NEEDLE) ×2 IMPLANT
NEEDLE HYPO 25X1 1.5 SAFETY (NEEDLE) ×2 IMPLANT
NS IRRIG 500ML POUR BTL (IV SOLUTION) ×2 IMPLANT
PACK BASIN DAY SURGERY FS (CUSTOM PROCEDURE TRAY) ×2 IMPLANT
PAD OB MATERNITY 4.3X12.25 (PERSONAL CARE ITEMS) ×2 IMPLANT
PAD PREP 24X48 CUFFED NSTRL (MISCELLANEOUS) ×2 IMPLANT
PENCIL BUTTON HOLSTER BLD 10FT (ELECTRODE) ×2 IMPLANT
SCOPETTES 8  STERILE (MISCELLANEOUS) ×2
SCOPETTES 8 STERILE (MISCELLANEOUS) ×2 IMPLANT
SUT VIC AB 0 SH 27 (SUTURE) ×2 IMPLANT
SUT VIC AB 2-0 CT2 27 (SUTURE) IMPLANT
SUT VIC AB 2-0 SH 27 (SUTURE)
SUT VIC AB 2-0 SH 27X BRD (SUTURE) IMPLANT
SUT VIC AB 2-0 SH 27XBRD (SUTURE) IMPLANT
SUT VIC AB 3-0 PS2 18 (SUTURE)
SUT VIC AB 3-0 PS2 18XBRD (SUTURE) IMPLANT
SUT VIC AB 3-0 SH 27 (SUTURE) ×1
SUT VIC AB 3-0 SH 27X BRD (SUTURE) ×1 IMPLANT
SUT VICRYL 2 0 18  UND BR (SUTURE)
SUT VICRYL 2 0 18 UND BR (SUTURE) IMPLANT
SUT VICRYL 4-0 PS2 18IN ABS (SUTURE) ×2 IMPLANT
SYR BULB IRRIGATION 50ML (SYRINGE) IMPLANT
SYR CONTROL 10ML LL (SYRINGE) IMPLANT
SYRINGE CONTROL L 12CC (SYRINGE) ×2 IMPLANT
TOWEL OR 17X24 6PK STRL BLUE (TOWEL DISPOSABLE) ×4 IMPLANT
TRAY DSU PREP LF (CUSTOM PROCEDURE TRAY) ×2 IMPLANT
TUBE CONNECTING 12X1/4 (SUCTIONS) ×2 IMPLANT
UNDERPAD 30X30 INCONTINENT (UNDERPADS AND DIAPERS) ×2 IMPLANT
VACUUM HOSE 7/8X10 W/ WAND (MISCELLANEOUS) IMPLANT
VACUUM HOSE/TUBING 7/8INX6FT (MISCELLANEOUS) ×2 IMPLANT
WATER STERILE IRR 500ML POUR (IV SOLUTION) ×2 IMPLANT
YANKAUER SUCT BULB TIP NO VENT (SUCTIONS) ×2 IMPLANT

## 2015-07-03 NOTE — Op Note (Signed)
PATIENT: Rebecca Carr DATE: 07/03/15   Preop Diagnosis: VIN3  Postoperative Diagnosis: same  Surgery: CO2 laser of the vulva  Surgeons:  Donaciano Eva, MD Assistant: none  Anesthesia: General   Estimated blood loss: <27ml  IVF:  241ml   Urine output: 50 ml   Complications: None   Pathology: none   Operative findings: 6cm area of acetowhite changes to anterior left and right labia minora, clitoral hood and glans and expanding to the right anterior anterior vulva.  Procedure: The patient was identified in the preoperative holding area. Informed consent was signed on the chart. Patient was seen history was reviewed and exam was performed.   The patient was then taken to the operating room and placed in the supine position with SCD hose on. General anesthesia was then induced without difficulty. She was then placed in the dorsolithotomy position. The perineum was prepped with Betadine. The vagina was prepped with chorhexadine. The patient was then draped after the prep was dried. A Foley catheter was inserted into the bladder under sterile conditions.  Timeout was performed the patient, procedure, antibiotic, allergy, and length of procedure. 5% acetic acid solution was applied to the perineum. The vulvar tissues were inspected for areas of acetowhite changes or leukoplakia. The lesion was identified and the marking pen was used to circumscribe the area with appropriate surgical margins. The subcuticular tissues were infiltrated with 1% lidocaine.   All staff were fixed with appropriate laser-safe eyewear and masks, as was the patient. Suction was applied to the field. The patient was draped with wet towels. The CO2 laser was set to 12 watts continuous and tested for accuracy. The circumscribed area was ablated with laser and the escar was removed with wiping from a wet raytec. Silvadine cream was applied.  All instrument, suture, laparotomy, Ray-Tec, and needle  counts were correct x2. The patient tolerated the procedure well and was taken recovery room in stable condition. This is Rebecca Carr dictating an operative note on Rebecca Carr.  Donaciano Eva, MD

## 2015-07-03 NOTE — Interval H&P Note (Signed)
History and Physical Interval Note:  07/03/2015 10:40 AM  Rebecca Carr  has presented today for surgery, with the diagnosis of VIN III  The various methods of treatment have been discussed with the patient and family. After consideration of risks, benefits and other options for treatment, the patient has consented to  Procedure(s): CO2 LASER OF VULVA  (N/A) CO2 LASER APPLICATION  (N/A) as a surgical intervention .  The patient's history has been reviewed, patient examined, no change in status, stable for surgery.  I have reviewed the patient's chart and labs.  Questions were answered to the patient's satisfaction.     Donaciano Eva

## 2015-07-03 NOTE — H&P (View-Only) (Signed)
Consult Note: Gyn-Onc  Consult was requested by Dr. Helane Rima for the evaluation of Rebecca Carr 65 y.o. female  CC:  Chief Complaint  Patient presents with  . VIN3    Assessment/Plan:  Rebecca Carr  is a 65 y.o.  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  I discussed with Rebecca Carr that the pathology is consistent with a premalignant lesion and that there are no apparent sites more concerning for invasive disease.  I discussed that VIN3 can be treated either with excision or ablation. Ablation runs the risk of missed occult invasive disease as it does not produce pathology. However, ablation allows for sparing of vital structures (in this case clitoris).  After hearing about her options she has elected for CO2 laser of the vulva. We discussed the procedure, its risks and postop care and recovery.  I discussed that there is a 25% risk for recurrence after treatment of VIN3.  She requires annual surveillance for DES exposure and increased risk for adenocarcinoma and clear cell of the upper vagina and cervix.   HPI: Rebecca Carr is a 65 year old G1P1 who is seen in consultation at the request of Dr Helane Rima for [facility].    The patient first noted vulvar pruritis and pain at the clitoris in 2015. She discussed this with Dr Helane Rima who thought she might have vulvar atrophy and prescribed a cream (possibly premarin). One year later at her annual visit she had persistent symptoms that she mentioned, and Dr Helane Rima felt this might be vulvar candidiasis and prescribed a course of anti-fungal. This did not ameliorate the symptoms. 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  The patient has a history of tobacco abuse, but quit 1 year  ago.  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   She takes a baby aspirin daily.   Current Meds:  Outpatient Encounter Prescriptions as of 06/30/2015  Medication Sig  . aspirin 81 MG chewable tablet Chew 81 mg by mouth.  Marland Kitchen atorvastatin (LIPITOR) 40 MG tablet Take 40 mg by mouth daily.  Marland Kitchen azelastine (ASTELIN) 0.1 % nasal spray Place 2 sprays into both nostrils daily. Use in each nostril as directed  . fluticasone (FLONASE) 50 MCG/ACT nasal spray Place 1 spray into both nostrils daily.  . nicotine polacrilex (NICORETTE) 4 MG gum Take 4 mg by mouth as needed for smoking cessation.  Marland Kitchen omeprazole (PRILOSEC) 40 MG capsule TAKE ONE CAPSULE BY MOUTH DAILY BEFORE BREAKFAST  . thyroid (ARMOUR THYROID) 120 MG tablet Take 1 tablet (120 mg total) by mouth daily. Appt with Labs needed for refills.  . [DISCONTINUED] ARMOUR THYROID PO Take 120 mg by mouth daily.    No facility-administered encounter medications on file as of 06/30/2015.    Allergy:  Allergies  Allergen Reactions  . Iodine     REACTION: anaphylactic shock  . Iohexol      Desc: Patient states she is allergic to iodine and had a "code blue" incident after an injection for some type of imaging study.   . Penicillins Rash    PCN IN LARGE DOSES.    Social Hx:   Social History   Social History  .  Marital Status: Married    Spouse Name: N/A  . Number of Children: N/A  . Years of Education: N/A   Occupational History  . Not on file.   Social History Main Topics  . Smoking status: Former Smoker -- 0.50 packs/day    Types: Cigarettes    Quit date: 05/31/2014  . Smokeless tobacco: Never Used     Comment: using nicorrette  . Alcohol Use: Yes     Comment: 1 beer a day  . Drug Use: No  . Sexual Activity: Not on file   Other Topics Concern  . Not on file   Social History Narrative    Past Surgical Hx:  Past Surgical History  Procedure Laterality Date  . C sectioin    .  Cholecystectomy    . Tonsillectomy    . Upper gastrointestinal endoscopy      Past Medical Hx:  Past Medical History  Diagnosis Date  . Hyperlipidemia   . Hypothyroid   . MTHFR mutation ([place])   . 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 Asthma   . Allergy     DUST MITES, HONEY BEES  . Anemia   . Anxiety   . GERD (gastroesophageal reflux disease)   . Sleep apnea   . Esophageal stricture   . Esophagitis     LA class B  . Tubular adenoma of colon 09/2013    Past Gynecological History:  Cesarean section x 1. DES exposure in utero.  No LMP recorded. Patient is postmenopausal.  Family Hx:  Family History  Problem Relation Age of Onset  . Pancreatic cancer Father   . Heart attack Mother   . Heart disease Mother   . Heart attack Brother   . COPD Brother   . Colon cancer Father   . Colon cancer Maternal Grandfather     Review of Systems:  Constitutional  Feels well,    ENT Normal appearing ears and nares bilaterally Skin/Breast  No rash, sores, jaundice, itching, dryness Cardiovascular  No chest pain, shortness of breath, or edema  Pulmonary  No cough or wheeze.  Gastro Intestinal  No nausea, vomitting, or diarrhoea. No bright red blood per rectum, no abdominal pain, change in bowel movement, or constipation.  Genito Urinary  No frequency, urgency, dysuria, + vulvar pruritis Musculo Skeletal  No myalgia, arthralgia, joint swelling or pain  Neurologic  No weakness, numbness, change in gait,  Psychology  No depression, anxiety, insomnia.   Vitals:  Blood pressure 118/59, pulse 80, temperature 98 F (36.7 C), temperature source Oral, resp. rate 17, height 5' 7.75" (1.721 m), weight 223 lb 6.4 oz (101.334 kg), SpO2 98 %.  Physical Exam: WD in NAD Neck  Supple NROM, without any enlargements.  Lymph Node Survey No cervical supraclavicular or inguinal adenopathy Cardiovascular  Pulse normal rate, regularity and rhythm. S1 and S2 normal.  Lungs  Clear to auscultation  bilateraly, without wheezes/crackles/rhonchi. Good air movement.  Skin  No rash/lesions/breakdown  Psychiatry  Alert and oriented to person, place, and time  Abdomen  Normoactive bowel sounds, abdomen soft, non-tender and nonobese without evidence of hernia.  Back No CVA tenderness Genito Urinary  Vulva/vagina: 4% acetic acid applied. 6cm area of acetowhite change to the anterior labia minora and majora extending across the midline to the clitoris. Involves entire clitoris.  Bladder/urethra:  No lesions or masses, well supported bladder  Vagina: normal  Cervix: Normal appearing, no lesions.  Uterus:  Small, mobile, no parametrial involvement or nodularity.  Adnexa: no palpable masses. Rectal  deferred Extremities  No bilateral cyanosis, clubbing or edema.   Donaciano Eva, MD  06/30/2015, 9:51 AM

## 2015-07-03 NOTE — Discharge Instructions (Signed)
Vulvar Laser, Care After The vulva is the external female genitalia, outside and around the vagina and pubic bone. It consists of:  The skin on, and in front of, the pubic bone.  The clitoris.  The labia majora (large lips) on the outside of the vagina.  The labia minora (small lips) around the opening of the vagina.  The opening and the skin in and around the vagina.  ACTIVITY  Rest as much as possible the first two days after discharge.  No restrictions on exercise    NUTRITION  You may resume your normal diet.  Drink 6 to 8 glasses of fluids a day.  Eat a healthy, balanced diet including portions of food from the meat (protein), milk, fruit, vegetable, and bread groups.  Your caregiver may recommend you take a multivitamin with iron. ELIMINATION  You may notice that your stream of urine is at a different angle, and may tend to spray. Using a plastic funnel may help to decrease urine spray.  If constipation occurs, drink more liquids, and add more fruits, vegetables, and bran to your diet. You may take a mild laxative, such as Milk of Magnesia, Metamucil, or a stool softener such as Colace, with permission from your caregiver. HYGIENE  You may shower and wash your hair.  Check with your caregiver about tub baths.  Do not add any bath oils or chemicals to your bath water, after you have permission to take baths.  While passing urine, pour water from a bottle or spray over your vulva to dilute the urine as it passes the incision (this will decrease burning and discomfort).  Clean yourself well after moving your bowels.  After urinating, do not wipe. Dap or pat dry with toilet paper or a dry cleath soft cloth.  A sitz bath will help keep your perineal area clean, reduce swelling, and provide comfort.  Avoid wearing underpants for the first 2 weeks and wear loose skirts to allow circulation of air around the incision  You should apply the silvadine cream to the  vulva after toileting. You can replace this with desitin cream after you run out of silvadine.  HOME CARE INSTRUCTIONS   Apply a soft ice pack (or frozen bag of peas) to your perineum (vulva) every hour in the first 48 hours after surgery. This will reduce swelling.  Avoid activities that involve a lot of friction between your legs.  Avoid wearing pants or underpants in the 1st 2 weeks (skirts are preferable).  Take your temperature twice a day and record it, especially if you feel feverish or have chills.  Follow your caregiver's instructions about medicines, activity, and follow-up appointments after surgery.  Do not drink alcohol while taking pain medicine.  Change your dressing as advised by your caregiver.  You may take over-the-counter medicine for pain, recommended by your caregiver.  If your pain is not relieved with medicine, call your caregiver.  Do not take aspirin because it can cause bleeding.  Do not douche or use tampons (use a nonperfumed sanitary pad).  Do not have sexual intercourse until your caregiver gives you permission (typically 6 weeks postoperatively). Hugging, kissing, and playful sexual activity is fine with your caregiver's permission.  Warm sitz baths, with your caregiver's permission, are helpful to control swelling and discomfort.  Take showers instead of baths, until your caregiver gives you permission to take baths.  You may take a mild medicine for constipation, recommended by your caregiver. Bran foods and drinking a lot  of fluids will help with constipation.  Make sure your family understands everything about your operation and recovery. SEEK MEDICAL CARE IF:   You notice swelling and redness around the wound area.  You notice a foul smell coming from the wound or on the surgical dressing.  You notice the wound is separating.  You have painful or bloody urination.  You develop nausea and vomiting.  You develop diarrhea.  You  develop a rash.  You have a reaction or allergy from the medicine.  You feel dizzy or light-headed.  You need stronger pain medicine. SEEK IMMEDIATE MEDICAL CARE IF:   You develop a temperature of 102 F (38.9 C) or higher.  You pass out.  You develop leg or chest pain.  You develop abdominal pain.  You develop shortness of breath.  You develop bleeding from the wound area.  You see pus in the wound area. MAKE SURE YOU:   Understand these instructions.  Will watch your condition.  Will get help right away if you are not doing well or get worse. Document Released: 09/09/2003 Document Revised: 06/11/2013 Document Reviewed: 12/27/2008 [facility] Patient Information 2015 [place], [place]. This information is not intended to replace advice given to you by your health care provider. Make sure you discuss any questions you have with your health care provider.   Post Anesthesia Home Care Instructions  Activity: Get plenty of rest for the remainder of the day. A responsible adult should stay with you for 24 hours following the procedure.  For the next 24 hours, DO NOT: -Drive a car -Paediatric nurse -Drink alcoholic beverages -Take any medication unless instructed by your physician -Make any legal decisions or sign important papers.  Meals: Start with liquid foods such as gelatin or soup. Progress to regular foods as tolerated. Avoid greasy, spicy, heavy foods. If nausea and/or vomiting occur, drink only clear liquids until the nausea and/or vomiting subsides. Call your physician if vomiting continues.  Special Instructions/Symptoms: Your throat may feel dry or sore from the anesthesia or the breathing tube placed in your throat during surgery. If this causes discomfort, gargle with warm salt water. The discomfort should disappear within 24 hours.  If you had a scopolamine patch placed behind your ear for the management of post- operative nausea and/or vomiting:  1. The  medication in the patch is effective for 72 hours, after which it should be removed.  Wrap patch in a tissue and discard in the trash. Wash hands thoroughly with soap and water. 2. You may remove the patch earlier than 72 hours if you experience unpleasant side effects which may include dry mouth, dizziness or visual disturbances. 3. Avoid touching the patch. Wash your hands with soap and water after contact with the patch.

## 2015-07-03 NOTE — Anesthesia Preprocedure Evaluation (Addendum)
Anesthesia Evaluation  Patient identified by MRN, date of birth, ID band Patient awake    Reviewed: Allergy & Precautions, NPO status , Patient's Chart, lab work & pertinent test results  Airway Mallampati: II  TM Distance: >3 FB Neck ROM: Full    Dental  (+) Dental Advisory Given, Teeth Intact, Caps,    Pulmonary asthma , sleep apnea , former smoker,  Pt does not wear her C-Pap mask @ HS   breath sounds clear to auscultation       Cardiovascular negative cardio ROS   Rhythm:Regular Rate:Normal     Neuro/Psych    GI/Hepatic Neg liver ROS, hiatal hernia, GERD  Medicated and Controlled,Pt relays "Esophageal reflux scarring"   Endo/Other  Hypothyroidism   Renal/GU negative Renal ROS     Musculoskeletal  (+) Arthritis ,   Abdominal   Peds  Hematology   Anesthesia Other Findings   Reproductive/Obstetrics                        Anesthesia Physical Anesthesia Plan  ASA: III  Anesthesia Plan: General   Post-op Pain Management:    Induction: Intravenous  Airway Management Planned: LMA  Additional Equipment:   Intra-op Plan:   Post-operative Plan: Extubation in OR  Informed Consent: I have reviewed the patients History and Physical, chart, labs and discussed the procedure including the risks, benefits and alternatives for the proposed anesthesia with the patient or authorized representative who has indicated his/her understanding and acceptance.   Dental advisory given  Plan Discussed with: CRNA  Anesthesia Plan Comments:         Anesthesia Quick Evaluation

## 2015-07-03 NOTE — Anesthesia Postprocedure Evaluation (Signed)
Anesthesia Post Note  Patient: Rebecca Carr  Procedure(s) Performed: Procedure(s) (LRB): CO2 LASER OF VULVA  (N/A) CO2 LASER APPLICATION  (N/A)  Patient location during evaluation: PACU Anesthesia Type: General Level of consciousness: awake Pain management: pain level controlled Vital Signs Assessment: post-procedure vital signs reviewed and stable Respiratory status: spontaneous breathing Cardiovascular status: stable Anesthetic complications: no    Last Vitals:  Filed Vitals:   07/03/15 1245 07/03/15 1358  BP: 137/78 121/60  Pulse: 76 68  Temp:  36.8 C  Resp: 14 16    Last Pain:  Filed Vitals:   07/03/15 1412  PainSc: 2                  EDWARDS,Vanita Cannell

## 2015-07-03 NOTE — Transfer of Care (Signed)
Immediate Anesthesia Transfer of Care Note  Patient: Rebecca Carr  Procedure(s) Performed: Procedure(s): CO2 LASER OF VULVA  (N/A) CO2 LASER APPLICATION  (N/A)  Patient Location: PACU  Anesthesia Type:General  Level of Consciousness: awake, alert , oriented and patient cooperative  Airway & Oxygen Therapy: Patient Spontanous Breathing and Patient connected to nasal cannula oxygen  Post-op Assessment: Report given to RN and Post -op Vital signs reviewed and stable  Post vital signs: Reviewed and stable converted to mask O2  Last Vitals:  Filed Vitals:   07/03/15 1018  BP: 128/84  Pulse: 80  Temp: 36.9 C  Resp: 14    Last Pain:  Filed Vitals:   07/03/15 1019  PainSc: 2       Patients Stated Pain Goal: 2 ([id number])  Complications: No apparent anesthesia complications

## 2015-07-03 NOTE — Anesthesia Procedure Notes (Signed)
Procedure Name: Intubation Date/Time: 07/03/2015 11:01 AM Performed by: Wanita Chamberlain Pre-anesthesia Checklist: Patient identified, Timeout performed, Emergency Drugs available, Suction available and Patient being monitored Patient Re-evaluated:Patient Re-evaluated prior to inductionOxygen Delivery Method: Circle system utilized Preoxygenation: Pre-oxygenation with 100% oxygen Intubation Type: IV induction Ventilation: Mask ventilation without difficulty Grade View: Grade III Tube type: Oral Tube size: 7.0 mm Number of attempts: 1 Airway Equipment and Method: Bougie stylet Placement Confirmation: positive ETCO2,  CO2 detector and breath sounds checked- equal and bilateral Secured at: 21 cm Tube secured with: Tape Dental Injury: Teeth and Oropharynx as per pre-operative assessment  Difficulty Due To: Difficulty was anticipated and Difficult Airway- due to anterior larynx Future Recommendations: Recommend- induction with short-acting agent, and alternative techniques readily available Comments: With cricoid manipulation arytenoids only viewed. Bougie utilized on 1st attempt. (Pt w/ c/o acid feeling in her chest, on interview )

## 2015-07-04 ENCOUNTER — Encounter (HOSPITAL_BASED_OUTPATIENT_CLINIC_OR_DEPARTMENT_OTHER): Payer: Self-pay | Admitting: Gynecologic Oncology

## 2015-07-09 ENCOUNTER — Telehealth: Payer: Self-pay

## 2015-07-09 DIAGNOSIS — G8918 Other acute postprocedural pain: Secondary | ICD-10-CM

## 2015-07-09 MED ORDER — TRAMADOL HCL 50 MG PO TABS: 50.0000 mg | ORAL_TABLET | Freq: Four times a day (QID) | ORAL | Status: DC | PRN

## 2015-07-09 NOTE — Telephone Encounter (Signed)
Incoming call from a patient of Dr Everitt Amber , patient states she took a Percocet this morning for apin to her surgical site (vulva) VAS 8/10 . Patient states she has been experiencing pruritis over my entire body with the exception of my "vulva". Melissa Cross, APNP updated , orders received to have the patient stop taking the Percocet . Also the patient should take Benadryl 25 MG PO X 1 , and we will change the prescription to Ultram 50 MG PO Every 6 hours as needed for pain , QTY: 20 , no refills. Recommendations discussed with the patient , Alysan states understanding , denies further questions at this time , will call with any additional changes or concerns. Percocet was discontinued related to severe pruritis .

## 2015-07-11 ENCOUNTER — Telehealth: Payer: Self-pay

## 2015-07-11 NOTE — Telephone Encounter (Signed)
Orders received from [place] to contact the patient with recommendations to continue to use the desitin cream to vulva . Patient states she has a "pimple" looking sore to vulva arae , patient informed that Joylene John, APNP is aware and the recommendations remain to use the Desitin . Patient states understanding , denies further questions at this time, will call with additional changes , questions or concerns.

## 2015-07-14 ENCOUNTER — Telehealth: Payer: Self-pay

## 2015-07-14 NOTE — Telephone Encounter (Signed)
Orders received from [place] to call the [patient back with Dr Terrence Dupont Rossi's recommendations to use Neosporin as well as Desitin ointment to affected area . Patient contacted and updated with Dr Serita Grit recommendations , patient states the "pimple" is still there . Patient was infromed that Dr Everitt Amber is aware and recommends Neosporin ointment , patient stated understanding and hung up.

## 2015-07-16 ENCOUNTER — Encounter: Payer: Self-pay | Admitting: Gynecologic Oncology

## 2015-07-16 ENCOUNTER — Ambulatory Visit: Payer: BLUE CROSS/BLUE SHIELD | Attending: Gynecologic Oncology | Admitting: Gynecologic Oncology

## 2015-07-16 VITALS — BP 126/50 | HR 96 | Temp 98.7°F | Resp 18 | Ht 67.75 in | Wt 220.2 lb

## 2015-07-16 DIAGNOSIS — F419 Anxiety disorder, unspecified: Secondary | ICD-10-CM | POA: Diagnosis not present

## 2015-07-16 DIAGNOSIS — E039 Hypothyroidism, unspecified: Secondary | ICD-10-CM | POA: Insufficient documentation

## 2015-07-16 DIAGNOSIS — K219 Gastro-esophageal reflux disease without esophagitis: Secondary | ICD-10-CM | POA: Insufficient documentation

## 2015-07-16 DIAGNOSIS — D071 Carcinoma in situ of vulva: Secondary | ICD-10-CM | POA: Insufficient documentation

## 2015-07-16 DIAGNOSIS — F1010 Alcohol abuse, uncomplicated: Secondary | ICD-10-CM | POA: Insufficient documentation

## 2015-07-16 DIAGNOSIS — Z86010 Personal history of colonic polyps: Secondary | ICD-10-CM | POA: Insufficient documentation

## 2015-07-16 DIAGNOSIS — Z9889 Other specified postprocedural states: Secondary | ICD-10-CM | POA: Diagnosis not present

## 2015-07-16 DIAGNOSIS — E785 Hyperlipidemia, unspecified: Secondary | ICD-10-CM | POA: Insufficient documentation

## 2015-07-16 DIAGNOSIS — K5730 Diverticulosis of large intestine without perforation or abscess without bleeding: Secondary | ICD-10-CM | POA: Insufficient documentation

## 2015-07-16 DIAGNOSIS — M1990 Unspecified osteoarthritis, unspecified site: Secondary | ICD-10-CM | POA: Diagnosis not present

## 2015-07-16 DIAGNOSIS — Z808 Family history of malignant neoplasm of other organs or systems: Secondary | ICD-10-CM | POA: Insufficient documentation

## 2015-07-16 DIAGNOSIS — N959 Unspecified menopausal and perimenopausal disorder: Secondary | ICD-10-CM | POA: Insufficient documentation

## 2015-07-16 DIAGNOSIS — G4733 Obstructive sleep apnea (adult) (pediatric): Secondary | ICD-10-CM | POA: Diagnosis not present

## 2015-07-16 DIAGNOSIS — Z7982 Long term (current) use of aspirin: Secondary | ICD-10-CM | POA: Insufficient documentation

## 2015-07-16 DIAGNOSIS — R7303 Prediabetes: Secondary | ICD-10-CM | POA: Diagnosis not present

## 2015-07-16 DIAGNOSIS — Z880 Allergy status to penicillin: Secondary | ICD-10-CM | POA: Diagnosis not present

## 2015-07-16 DIAGNOSIS — Z888 Allergy status to other drugs, medicaments and biological substances status: Secondary | ICD-10-CM | POA: Insufficient documentation

## 2015-07-16 DIAGNOSIS — Z87891 Personal history of nicotine dependence: Secondary | ICD-10-CM | POA: Diagnosis not present

## 2015-07-16 DIAGNOSIS — Z8249 Family history of ischemic heart disease and other diseases of the circulatory system: Secondary | ICD-10-CM | POA: Diagnosis not present

## 2015-07-16 DIAGNOSIS — N393 Stress incontinence (female) (male): Secondary | ICD-10-CM | POA: Diagnosis not present

## 2015-07-16 DIAGNOSIS — I341 Nonrheumatic mitral (valve) prolapse: Secondary | ICD-10-CM | POA: Diagnosis not present

## 2015-07-16 DIAGNOSIS — K449 Diaphragmatic hernia without obstruction or gangrene: Secondary | ICD-10-CM | POA: Insufficient documentation

## 2015-07-16 DIAGNOSIS — Z9049 Acquired absence of other specified parts of digestive tract: Secondary | ICD-10-CM | POA: Insufficient documentation

## 2015-07-16 NOTE — Patient Instructions (Signed)
Plan to follow up with Dr. Helane Rima in six months and Dr. Denman George in one year.  Please call our office at [phone] after you have seen Dr. Helane Rima to schedule an appointment with Dr. Denman George for June 2018.

## 2015-07-16 NOTE — Progress Notes (Signed)
Follow-up Note: Gyn-Onc  Consult was initially requested by Dr. Helane Rima for the evaluation of Rebecca Carr 65 y.o. female  CC:  Chief Complaint  Patient presents with  . VIN 3    postop followup    Assessment/Plan:  Rebecca Carr  is a 65 y.o.  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  She is healing well.  She is cleared for return to work.  I discussed the risk of recurrence (25%).   She requires 6 monthly surveillance x 2 years. She will follow-up with Dr Helane Rima in 6 months and with me in 12 months. 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  HPI: Rebecca Carr is a 65 year old G1P1 who is seen in consultation at the request of Dr Helane Rima for [facility].    The patient first noted vulvar pruritis and pain at the clitoris in 2015. She discussed this with Dr Helane Rima who thought she might have vulvar atrophy and prescribed a cream (possibly premarin). One year later at her annual visit she had persistent symptoms that she mentioned, and Dr Helane Rima felt this might be vulvar candidiasis and prescribed a course of anti-fungal. This did not ameliorate the symptoms. 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  The patient has a history of tobacco abuse, but quit 1 year ago.  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   She takes a baby aspirin daily.   Interval Hx:  On 07/03/15 the patient underwent CO2 laser of the vulva anteriorally. The procedure and her recovery were uncomplicated (she developed influenza with fevers and constipation, but no vulvar issues)  Current Meds:  Outpatient Encounter  Prescriptions as of 07/16/2015  Medication Sig  . ALBUTEROL IN Inhale into the lungs as needed.  Marland Kitchen atorvastatin (LIPITOR) 40 MG tablet Take 40 mg by mouth every evening.   Marland Kitchen azelastine (ASTELIN) 0.1 % nasal spray Place 2 sprays into both nostrils every morning. Use in each nostril as directed  . docusate sodium (COLACE) 100 MG capsule Take 1 capsule (100 mg total) by mouth 2 (two) times daily.  . fluticasone (FLONASE) 50 MCG/ACT nasal spray Place 2 sprays into both nostrils every morning.  . nicotine polacrilex (NICORETTE) 4 MG gum Take 2 mg by mouth as needed for smoking cessation.   Marland Kitchen omeprazole (PRILOSEC) 40 MG capsule TAKE ONE CAPSULE BY MOUTH DAILY BEFORE BREAKFAST (Patient taking differently: TAKE ONE CAPSULE BY MOUTH DAILY BEFORE BREAKFAst---  pt takes at noon)  . Pseudoeph-Doxylamine-DM-APAP (NYQUIL PO) Take by mouth at bedtime as needed.  . thyroid (ARMOUR THYROID) 120 MG tablet Take 1 tablet (120 mg total) by mouth daily. Appt with Labs needed for refills. (Patient taking differently: Take 120 mg by mouth daily before breakfast. Appt with Labs needed for refills.)  . traMADol (ULTRAM) 50 MG tablet Take 1 tablet (50 mg total) by mouth every 6 (six) hours as needed for moderate pain.  Marland Kitchen aspirin EC 81 MG tablet Take 81 mg by mouth daily. Reported on 07/16/2015   No facility-administered encounter medications on file as of 07/16/2015.    Allergy:  Allergies  Allergen Reactions  . Iodine Anaphylaxis  . Iohexol Anaphylaxis     Desc: Patient states she is allergic to iodine and had a "code blue" incident after an injection for some type of imaging study.   . Penicillins Rash    PCN IN LARGE DOSES.    Social Hx:   Social History   Social History  . Marital Status: Married    Spouse Name: N/A  . Number of Children: N/A  . Years of Education: N/A   Occupational History  . Not on file.   Social History Main Topics  . Smoking status: Former Smoker -- 1.00 packs/day for 48 years     Types: Cigarettes    Quit date: 05/10/2014  . Smokeless tobacco: Never Used     Comment: using nicorrette  . Alcohol Use: Yes     Comment: 1 to 2  beer daily  . Drug Use: No  . Sexual Activity: Not on file   Other Topics Concern  . Not on file   Social History Narrative    Past Surgical Hx:  Past Surgical History  Procedure Laterality Date  . Cesarean section  1994  . Colonoscopy, esophagogastroduodenoscopy (egd) and esophageal dilation  10-04-2013  . Laparoscopic cholecystectomy  1995  . Laser ablation condolamata N/A 07/03/2015    Procedure: CO2 LASER OF VULVA ;  Surgeon: Everitt Amber, MD;  Location: [facility];  Service: Gynecology;  Laterality: N/A;  . Co2 laser application N/A [id number]    Procedure: CO2 LASER APPLICATION ;  Surgeon: Everitt Amber, MD;  Location: [facility];  Service: Gynecology;  Laterality: N/A;    Past Medical Hx:  Past Medical History  Diagnosis Date  . Hyperlipidemia   . MTHFR mutation ([place])   . Anxiety   . GERD (gastroesophageal reflux disease)   . Hypothyroidism   . History of esophagitis     LA class , grade B and Gastritis  . History of esophageal dilatation     of stricture  . History of adenomatous polyp of colon     hyperplastic 2015  and tubular adenoma 2005  . DES exposure in utero   . VIN III (vulvar intraepithelial neoplasia III)   . MVP (mitral valve prolapse)     per pt dx 1980 approx.  -- asymptomatic  . History of pituitary tumor     dx 1990's - asymptomatic /  per pt last scan 2005 approx.  resolved   . Prediabetes   . Diverticulosis of colon   . Environmental and seasonal allergies   . Arthritis   . Wears glasses   . Mild intermittent asthma   . OSA (obstructive sleep apnea)     non-compliant cpap  . Hiatal hernia   . SUI (stress urinary incontinence, female)     Past Gynecological History:  Cesarean section x 1. DES exposure in utero.  No LMP recorded. Patient is postmenopausal.  Family  Hx:  Family History  Problem Relation Age of Onset  . Pancreatic cancer Father   . Heart attack Mother   . Heart disease Mother   . Heart attack Brother   . COPD Brother   . Colon cancer Father   . Colon cancer Maternal Grandfather     Review of Systems:  Constitutional  Feels well,    ENT Normal appearing ears and nares bilaterally Skin/Breast  No rash, sores, jaundice, itching, dryness Cardiovascular  No chest pain, shortness of breath, or edema  Pulmonary  No cough or wheeze.  Gastro Intestinal  No nausea, vomitting, or diarrhoea. No bright red blood per rectum, no abdominal pain, change in bowel movement, or constipation.  Genito Urinary  No frequency, urgency, dysuria, + vulvar pruritis Musculo Skeletal  No myalgia, arthralgia, joint swelling or pain  Neurologic  No weakness, numbness, change in gait,  Psychology  No depression, anxiety, insomnia.   Vitals:  Blood pressure 126/50, pulse 96, temperature 98.7 F (37.1 C), temperature source Oral, resp. rate 18, height 5' 7.75" (1.721 m), weight 220 lb 3.2 oz (99.882 kg), SpO2 98 %.  Physical Exam: WD in NAD Neck  Supple NROM, without any enlargements.  Lymph Node Survey No cervical supraclavicular or inguinal adenopathy Cardiovascular  Pulse normal rate, regularity and rhythm. S1 and S2 normal.  Lungs  Clear to auscultation bilateraly, without wheezes/crackles/rhonchi. Good air movement.  Skin  No rash/lesions/breakdown  Psychiatry  Alert and oriented to person, place, and time  Abdomen  Normoactive bowel sounds, abdomen soft, non-tender and nonobese without evidence of hernia.  Back No CVA tenderness Genito Urinary  Vulva/vagina: 4% acetic acid applied. 6cm area of acetowhite change to the anterior labia minora and majora extending across the midline to the clitoris. Involves entire clitoris.  Bladder/urethra:  No lesions or masses, well supported bladder  Vagina: normal  Cervix: Normal appearing, no  lesions.  Uterus:  Small, mobile, no parametrial involvement or nodularity.  Adnexa: no palpable masses. Rectal  deferred Extremities  No bilateral cyanosis, clubbing or edema.   Donaciano Eva, MD  07/16/2015, 4:12 PM

## 2015-08-04 ENCOUNTER — Ambulatory Visit: Payer: BLUE CROSS/BLUE SHIELD | Admitting: Gynecologic Oncology

## 2015-09-17 ENCOUNTER — Ambulatory Visit: Payer: BLUE CROSS/BLUE SHIELD | Attending: Gynecologic Oncology | Admitting: Gynecologic Oncology

## 2015-09-17 ENCOUNTER — Encounter: Payer: Self-pay | Admitting: Gynecologic Oncology

## 2015-09-17 DIAGNOSIS — Z87898 Personal history of other specified conditions: Secondary | ICD-10-CM | POA: Insufficient documentation

## 2015-09-17 DIAGNOSIS — Z87891 Personal history of nicotine dependence: Secondary | ICD-10-CM | POA: Insufficient documentation

## 2015-09-17 DIAGNOSIS — D071 Carcinoma in situ of vulva: Secondary | ICD-10-CM | POA: Diagnosis not present

## 2015-09-17 DIAGNOSIS — Z7982 Long term (current) use of aspirin: Secondary | ICD-10-CM | POA: Diagnosis not present

## 2015-09-17 DIAGNOSIS — Z8589 Personal history of malignant neoplasm of other organs and systems: Secondary | ICD-10-CM | POA: Diagnosis not present

## 2015-09-17 DIAGNOSIS — Z9889 Other specified postprocedural states: Secondary | ICD-10-CM | POA: Diagnosis not present

## 2015-09-17 DIAGNOSIS — L292 Pruritus vulvae: Secondary | ICD-10-CM | POA: Diagnosis present

## 2015-09-17 NOTE — Progress Notes (Signed)
Office Visit  Note: Gyn-Onc  CC: Vulvar itching, history of VIN3  Assessment/Plan:  This is a 65 y.o. 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  Biopsy of the lesion resulted in complete removal of acetowhite area. The patient is instructed in perineal care. She will follow up in 6 months with her Gyn as planned then 12 months with Dr. Denman George as scheduled for continued surveillance. We will call her with the pathology results, but suspect a VIN3 lesion consistent with her previous pathology.  Gillian Scarce, MD 09/17/2015, 9:57 AM   HPI: The patient first noted vulvar pruritis and pain at the clitoris in 2015. She discussed this with Dr Helane Rima who thought she might have vulvar atrophy and prescribed a cream (possibly premarin). One year later at her annual visit she had persistent symptoms that she mentioned, and Dr Helane Rima felt this might be vulvar candidiasis and prescribed a course of anti-fungal. This did not ameliorate the symptoms. 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 On 07/03/15 she underwent a vulvar laser for removal of the lesion. She recovered well and last saw Dr. Denman George on 07/16/15. She noticed increased vulvar pruritis last week. She has no other new complaint.  The patient has a history of tobacco abuse, but quit 1 year ago. She still chews nicotine gum and is trying to also stop this.  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 She is not due for one at this time.  She takes a baby aspirin daily.   Social Hx:   Social History   Social History  . Marital status: Married    Spouse name: N/A  . Number of  children: N/A  . Years of education: N/A   Occupational History  . Not on file.   Social History Main Topics  . Smoking status: Former Smoker    Packs/day: 1.00    Years: 48.00    Types: Cigarettes    Quit date: 05/10/2014  . Smokeless tobacco: Never Used     Comment: using nicorrette  . Alcohol use No  . Drug use: No  . Sexual activity: Not on file   Other Topics Concern  . Not on file   Social History Narrative  . No narrative on file    Past Surgical Hx:  Past Surgical History:  Procedure Laterality Date  . CESAREAN SECTION  1994  . CO2 LASER APPLICATION N/A [id number]   Procedure: CO2 LASER APPLICATION ;  Surgeon: Everitt Amber, MD;  Location: [facility];  Service: Gynecology;  Laterality: N/A;  . COLONOSCOPY, ESOPHAGOGASTRODUODENOSCOPY (EGD) AND ESOPHAGEAL DILATION  10-04-2013  . LAPAROSCOPIC CHOLECYSTECTOMY  1995  . LASER ABLATION CONDOLAMATA N/A 07/03/2015   Procedure: CO2 LASER OF VULVA ;  Surgeon: Everitt Amber, MD;  Location: [facility];  Service: Gynecology;  Laterality: N/A;    Past Medical Hx:  Past Medical History:  Diagnosis Date  . Anxiety   . Arthritis   . DES exposure in utero   . Diverticulosis of colon   . Environmental and seasonal allergies   . GERD (gastroesophageal reflux disease)   . Hiatal  hernia   . History of adenomatous polyp of colon    hyperplastic 2015  and tubular adenoma 2005  . History of esophageal dilatation    of stricture  . History of esophagitis    LA class , grade B and Gastritis  . History of pituitary tumor    dx 1990's - asymptomatic /  per pt last scan 2005 approx.  resolved   . Hyperlipidemia   . Hypothyroidism   . Mild intermittent asthma   . MTHFR mutation ([place])   . MVP (mitral valve prolapse)    per pt dx 1980 approx.  -- asymptomatic  . OSA (obstructive sleep apnea)    non-compliant cpap  . Prediabetes   . SUI (stress urinary incontinence, female)   . VIN III (vulvar  intraepithelial neoplasia III)   . Wears glasses     Family Hx:  Family History  Problem Relation Age of Onset  . Pancreatic cancer Father   . Colon cancer Father   . Heart attack Mother   . Heart disease Mother   . Heart attack Brother   . COPD Brother   . Colon cancer Maternal Grandfather     Review of Systems:  Constitutional: Feels well  Skin: No rash, sores, jaundice, itching, dryness,  Cardiovascular: No chest pain, shortness of breath, or edema  Pulmonary: No cough or wheeze.  Gastro Intestinal: No nausea, vomitting, or diarrhea. No bright red blood per rectum, no abdominal pain, change in bowel movement, or constipation.  Genito Urinary: No frequency, urgency, dysuria,  Musculo Skeletal: No myalgia, arthralgia, joint swelling or pain  Neurologic: No weakness, numbness, change in gait,  Psychology: No depression, anxiety, insomnia.    Vitals:  Blood pressure 132/76, pulse 99, temperature 97.8 F (36.6 C), temperature source Oral, resp. rate 18, height 5' 7.75" (1.721 m), weight 228 lb 11.2 oz (103.7 kg), SpO2 98 %.  Physical Exam: WD female  Neck: Supple without any enlargements.  Lymph node survey: No inguinal adenopathy  Skin: No rash/lesions/breakdown  Psychiatry: Alert and oriented to person, place, and time  Abdomen: Soft, non-tender and obese. No hernia.  Genito Urinary: Vulva/vagina: Normal external female genitalia.  No lesions on initial inspection.   Bladder/urethra:  No lesions or masses Extremities: No bilateral cyanosis, clubbing or edema.   Vulvar colposcopy and biopsy: Diluate acetic acid was applied liberally to the vulva. A 57mm lesion was seen on the right clitoris just to the right of the midline with acetowhite uptake.  The procedure was explained to the patient and verbal consent was obtained prior to the procedure.  The skin was cleaned with alcohol given iodine allergy.  10 ml of 2% lidocaine was injected at the planned biopsy site.  A 3 mm  punch biopsy was used to obtain the biopsy specimen. The visible lesion was removed in its entirety. Hemostasis was achieved with silver nitrate.  Patient tolerated the procedure well.

## 2015-09-17 NOTE — Patient Instructions (Signed)
We will contact you with the results of your biopsy from today.  Please call for any questions or concerns.  VULVAR BIOPSY POST-PROCEDURE INSTRUCTIONS  1. You may take Ibuprofen, Aleve or Tylenol for pain if needed.    2. You may have a small amount of spotting.  You should wear a mini pad for the next few days.  3. You may use some topical Neosporin ointment if you would like (over the counter is fine).  4. You need to call if you have redness around the biopsy site, if there is any unusual draining, if the bleeding is heavy, or if you are concerned.  5. Shower or bathe as normal  6. We will call you within one week with results or we will discuss the results at your follow-up appointment if needed.

## 2015-09-19 ENCOUNTER — Telehealth: Payer: Self-pay | Admitting: Gynecologic Oncology

## 2015-09-19 NOTE — Telephone Encounter (Signed)
Patient returned call.  Discussed biopsy results and Dr. Ainsley Spinner recommendations below:  With positive margins she could come back for repeat colposcopy to determine if she needs re-excision versus repeat laser OR she can heal and monitor for symptoms and repeat excision after. I would favor healing and repeat colpo in 6 months, sooner if symptoms. She is very high risk for recurrence given positive margins.  Patient agreeable with letting the area heal and following up in six months time or sooner if needed.  Advised to call in one to two months to schedule that 6 mth appt.  No concerns voiced.

## 2015-09-19 NOTE — Telephone Encounter (Signed)
Attempted to call patient with biopsy results.  Will retry at a later time.

## 2016-05-21 DIAGNOSIS — Z779 Other contact with and (suspected) exposures hazardous to health: Secondary | ICD-10-CM | POA: Diagnosis not present

## 2016-05-21 DIAGNOSIS — Z6836 Body mass index (BMI) 36.0-36.9, adult: Secondary | ICD-10-CM | POA: Diagnosis not present

## 2016-05-21 DIAGNOSIS — Z1231 Encounter for screening mammogram for malignant neoplasm of breast: Secondary | ICD-10-CM | POA: Diagnosis not present

## 2016-05-21 DIAGNOSIS — R350 Frequency of micturition: Secondary | ICD-10-CM | POA: Diagnosis not present

## 2016-05-21 DIAGNOSIS — N958 Other specified menopausal and perimenopausal disorders: Secondary | ICD-10-CM | POA: Diagnosis not present

## 2016-07-07 DIAGNOSIS — H5213 Myopia, bilateral: Secondary | ICD-10-CM | POA: Diagnosis not present

## 2016-07-07 DIAGNOSIS — H52203 Unspecified astigmatism, bilateral: Secondary | ICD-10-CM | POA: Diagnosis not present

## 2016-07-07 DIAGNOSIS — E039 Hypothyroidism, unspecified: Secondary | ICD-10-CM | POA: Diagnosis not present

## 2016-07-07 DIAGNOSIS — H43813 Vitreous degeneration, bilateral: Secondary | ICD-10-CM | POA: Diagnosis not present

## 2016-07-07 DIAGNOSIS — H02832 Dermatochalasis of right lower eyelid: Secondary | ICD-10-CM | POA: Diagnosis not present

## 2016-07-07 DIAGNOSIS — H5210 Myopia, unspecified eye: Secondary | ICD-10-CM | POA: Diagnosis not present

## 2016-07-07 DIAGNOSIS — H11823 Conjunctivochalasis, bilateral: Secondary | ICD-10-CM | POA: Diagnosis not present

## 2016-07-07 DIAGNOSIS — H43393 Other vitreous opacities, bilateral: Secondary | ICD-10-CM | POA: Diagnosis not present

## 2016-07-07 DIAGNOSIS — H02833 Dermatochalasis of right eye, unspecified eyelid: Secondary | ICD-10-CM | POA: Diagnosis not present

## 2016-07-07 DIAGNOSIS — H52209 Unspecified astigmatism, unspecified eye: Secondary | ICD-10-CM | POA: Diagnosis not present

## 2016-07-07 DIAGNOSIS — H02835 Dermatochalasis of left lower eyelid: Secondary | ICD-10-CM | POA: Diagnosis not present

## 2016-07-07 DIAGNOSIS — H02836 Dermatochalasis of left eye, unspecified eyelid: Secondary | ICD-10-CM | POA: Diagnosis not present

## 2016-07-07 DIAGNOSIS — E7800 Pure hypercholesterolemia, unspecified: Secondary | ICD-10-CM | POA: Diagnosis not present

## 2016-07-07 DIAGNOSIS — H02834 Dermatochalasis of left upper eyelid: Secondary | ICD-10-CM | POA: Diagnosis not present

## 2016-07-07 DIAGNOSIS — Z87891 Personal history of nicotine dependence: Secondary | ICD-10-CM | POA: Diagnosis not present

## 2016-07-07 DIAGNOSIS — H25813 Combined forms of age-related cataract, bilateral: Secondary | ICD-10-CM | POA: Diagnosis not present

## 2016-07-07 DIAGNOSIS — H02831 Dermatochalasis of right upper eyelid: Secondary | ICD-10-CM | POA: Diagnosis not present

## 2016-07-07 DIAGNOSIS — H524 Presbyopia: Secondary | ICD-10-CM | POA: Diagnosis not present

## 2016-07-14 DIAGNOSIS — M79604 Pain in right leg: Secondary | ICD-10-CM | POA: Diagnosis not present

## 2016-07-14 DIAGNOSIS — E039 Hypothyroidism, unspecified: Secondary | ICD-10-CM | POA: Diagnosis not present

## 2016-07-14 DIAGNOSIS — M79605 Pain in left leg: Secondary | ICD-10-CM | POA: Diagnosis not present

## 2016-07-14 DIAGNOSIS — Z0000 Encounter for general adult medical examination without abnormal findings: Secondary | ICD-10-CM | POA: Diagnosis not present

## 2016-07-14 DIAGNOSIS — R42 Dizziness and giddiness: Secondary | ICD-10-CM | POA: Diagnosis not present

## 2016-07-14 DIAGNOSIS — R002 Palpitations: Secondary | ICD-10-CM | POA: Diagnosis not present

## 2016-07-22 ENCOUNTER — Telehealth: Payer: Self-pay | Admitting: Cardiology

## 2016-07-22 NOTE — Telephone Encounter (Signed)
Received records from [facility] for appointment on 07/30/16 with Dr Ellyn Hack.  Records put with Dr Allison Quarry schedule for 07/30/16. lp

## 2016-07-29 ENCOUNTER — Encounter: Payer: Self-pay | Admitting: Cardiology

## 2016-07-30 ENCOUNTER — Encounter: Payer: Self-pay | Admitting: Cardiology

## 2016-07-30 ENCOUNTER — Ambulatory Visit (INDEPENDENT_AMBULATORY_CARE_PROVIDER_SITE_OTHER): Payer: Medicare Other | Admitting: Cardiology

## 2016-07-30 ENCOUNTER — Encounter (INDEPENDENT_AMBULATORY_CARE_PROVIDER_SITE_OTHER): Payer: Self-pay

## 2016-07-30 VITALS — Ht 67.0 in | Wt 246.0 lb

## 2016-07-30 DIAGNOSIS — R000 Tachycardia, unspecified: Secondary | ICD-10-CM | POA: Diagnosis not present

## 2016-07-30 DIAGNOSIS — I341 Nonrheumatic mitral (valve) prolapse: Secondary | ICD-10-CM | POA: Insufficient documentation

## 2016-07-30 DIAGNOSIS — I493 Ventricular premature depolarization: Secondary | ICD-10-CM | POA: Diagnosis not present

## 2016-07-30 DIAGNOSIS — R9431 Abnormal electrocardiogram [ECG] [EKG]: Secondary | ICD-10-CM

## 2016-07-30 DIAGNOSIS — R002 Palpitations: Secondary | ICD-10-CM | POA: Diagnosis not present

## 2016-07-30 DIAGNOSIS — R252 Cramp and spasm: Secondary | ICD-10-CM

## 2016-07-30 DIAGNOSIS — I340 Nonrheumatic mitral (valve) insufficiency: Secondary | ICD-10-CM | POA: Diagnosis not present

## 2016-07-30 NOTE — Progress Notes (Signed)
PCP: Starlyn Skeans, PA-C  Clinic Note: Chief Complaint  Patient presents with  . New Patient (Initial Visit)    abnormal ekg, palpitations, dizziness    HPI: Rebecca Carr is a 66 y.o. female who is being seen today for the evaluation of palpitations & abnormal EKG with heart murmur at the request of Starlyn Skeans, PA-C.  Rebecca Carr was seen on June 6 by her PCP. She was discussing dizziness issues. Usually the dizziness was associated with certain movements.. She also noted palpitations. She was found to have PVCs on EKG as well as a murmur. She apparently has a past history of mitral valve prolapse.  Recent Hospitalizations: N/A   Studies Personally Reviewed - (if available, images/films reviewed: From Epic Chart or Care Everywhere)  2 D Echo 2012: ([place]) Normal LVEF 55-60%. Mild-Mod MR (did not mention MVP). Normal Diastolic Fxn.   EKG from PCP: SR 92, PVC. ~ L Atrial Abnormality. Otw normal  Interval History: Iodine has multiple complaints today. She has complaints of heart palpitations both skipped beats and some rapid beats. She has dizziness, leg cramping.  He has dizziness that sounds a lot like vertigo, but also has some that sounds like orthostatic dizziness where she stands up and gets a rush of dizziness. Today her orthostatic evaluation did not show significant change. -She was treated with high-dose prednisone for presumed labyrinthitis or something that was causing dizziness, although it did was make her feel funny. She did not notice any real change.  She has had at least one episode where she was feeling some palpitations that at this time associated with some dizziness and an unusual sensation of fogginess where she "saw a bright light "she's not really sure how long this lasted, but she did notice a little bit of shortness of breath and started to panic. She did not lose consciousness but thinks she may have come close to  passing out.  She is noted having episodes of fast heart rate over last couple weeks that the spells will last maybe admitted time. It was one of these episodes where she saw a bright light. She actually felt like she needed to cough, but she is not sure if that's what needed stop. She described it as a regular rapid rate. She is only been noticing this for the last couple months, but has not had any episodes over last week or so. She does admit to drinking quite a bit of caffeine (at least a half a pot a day) because she "otherwise has a hard time getting going in the morning."  Interestingly, her dizziness where she feels "spinning sensation" is usually related to certain head movements or turning or moving a rapid manner. These or not associated with her rapid heart rates.  She does use CPAP, but needs a new machine because or an adapter to warm the air because it is so cold and makes her sore throat.  She still chews Nicorette gum having quit smoking cigarettes 2 years ago.  She has significant seasonal allergies  No chest pain or shortness of breath with rest or exertion. No PND, orthopnea or edema. No TIA/amaurosis fugax symptoms. No melena, hematochezia, hematuria, or epstaxis. No claudication.  ROS: A comprehensive was performed. Review of Systems  Constitutional: Positive for malaise/fatigue (Has a hard time "getting going" in the morning. Coffee).  HENT: Positive for congestion.   Respiratory: Positive for cough (Sometimes the morning. Worse with allergies).   Cardiovascular:  Positive for leg swelling (Rarely she has some puffy swelling.).  Genitourinary: Negative for dysuria and hematuria.  Musculoskeletal: Positive for joint pain and myalgias (Occasional night cramps. These always occur at rest. Tend to be better when she stands up).  Neurological: Positive for dizziness (Per history of present illness). Negative for focal weakness, loss of consciousness and weakness.    Endo/Heme/Allergies: Positive for environmental allergies.  Psychiatric/Behavioral: Positive for depression. Negative for memory loss. The patient is nervous/anxious. The patient does not have insomnia.   All other systems reviewed and are negative.   I have reviewed and (if needed) personally updated the patient's problem list, medications, allergies, past medical and surgical history, social and family history.   Past Medical History:  Diagnosis Date  . Anxiety   . Arthritis   . DES exposure in utero   . Diverticulosis of colon   . Dizziness   . Environmental and seasonal allergies   . GERD (gastroesophageal reflux disease)   . Heart palpitations   . Hiatal hernia   . History of adenomatous polyp of colon    hyperplastic 2015  and tubular adenoma 2005  . History of esophageal dilatation    of stricture  . History of esophagitis    LA class , grade B and Gastritis  . History of pituitary tumor    dx 1990's - asymptomatic /  per pt last scan 2005 approx.  resolved   . Hyperlipidemia   . Hypothyroidism   . Leg pain, bilateral   . Mild intermittent asthma   . Moderate mitral regurgitation by prior echocardiography 2012   1-2 MR on Echo  . MTHFR mutation ([place])   . MVP (mitral valve prolapse)    per pt dx 1980 approx.  -- asymptomatic; Not seen on 2012 Echo  . OSA (obstructive sleep apnea)    non-compliant cpap  . Prediabetes   . SUI (stress urinary incontinence, female)   . VIN III (vulvar intraepithelial neoplasia III)   . Wears glasses     Past Surgical History:  Procedure Laterality Date  . CESAREAN SECTION  1994  . CO2 LASER APPLICATION N/A 2/94/7654   Procedure: CO2 LASER APPLICATION ;  Surgeon: Everitt Amber, MD;  Location: [facility];  Service: Gynecology;  Laterality: N/A;  . COLONOSCOPY, ESOPHAGOGASTRODUODENOSCOPY (EGD) AND ESOPHAGEAL DILATION  10-04-2013  . LAPAROSCOPIC CHOLECYSTECTOMY  1995  . LASER ABLATION CONDOLAMATA N/A 07/03/2015    Procedure: CO2 LASER OF VULVA ;  Surgeon: Everitt Amber, MD;  [facility];  Service: Gynecology;  Laterality: N/A;  . TRANSTHORACIC ECHOCARDIOGRAM  11/2010   Normal LV Size & Fxn - EF 55-60%. Mild-Mod MR. Normal DF.    Current Meds  Medication Sig  . ALBUTEROL IN Inhale into the lungs as needed.  Marland Kitchen aspirin EC 81 MG tablet Take 81 mg by mouth daily. Reported on 07/16/2015  . atorvastatin (LIPITOR) 40 MG tablet Take 20 mg by mouth every evening.   Marland Kitchen azelastine (ASTELIN) 0.1 % nasal spray Place 2 sprays into both nostrils daily.  . busPIRone (BUSPAR) 15 MG tablet Take 15 mg by mouth 3 (three) times daily as needed.   . fluticasone (FLONASE) 50 MCG/ACT nasal spray Place 2 sprays into both nostrils every morning.  Marland Kitchen levothyroxine (SYNTHROID, LEVOTHROID) 137 MCG tablet Take 137 mcg by mouth daily before breakfast.  . nicotine polacrilex (NICORETTE) 4 MG gum Take 2 mg by mouth as needed for smoking cessation.   Marland Kitchen omeprazole (PRILOSEC)  40 MG capsule TAKE ONE CAPSULE BY MOUTH DAILY BEFORE BREAKFAST (Patient taking differently: TAKE ONE CAPSULE BY MOUTH DAILY BEFORE BREAKFAst---  pt takes at noon)    Allergies  Allergen Reactions  . Iodine Anaphylaxis  . Iohexol Anaphylaxis     Desc: Patient states she is allergic to iodine and had a "code blue" incident after an injection for some type of imaging study.   . Penicillins Rash    PCN IN LARGE DOSES.    Social History   Social History  . Marital status: Married    Spouse name: Jenny Reichmann  . Number of children: 1  . Years of education: N/A   Occupational History  . 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 Smoking status: Former Smoker    Packs/day: 1.00    Years: 48.00    Types: Cigarettes    Quit date: 05/10/2014  . Smokeless tobacco: Never Used     Comment: using nicorrette  . Alcohol use 1.2 oz/week    2 Standard drinks or equivalent per week     Comment: Social - > is now  essentially quit  . Drug use: No  . Sexual activity: Not Asked   Other Topics Concern  . None   Social History Narrative   No routine exercise. Describes herself as "lazy "    family history includes COPD in her brother; Colon cancer in her father and maternal grandfather; Dementia in her brother; Heart attack in her brother and mother; Heart disease in her mother; Pancreatic cancer in her father.  Wt Readings from Last 3 Encounters:  07/30/16 246 lb (111.6 kg)  09/17/15 228 lb 11.2 oz (103.7 kg)  07/16/15 220 lb 3.2 oz (99.9 kg)    PHYSICAL EXAM Ht 5\' 7"  (1.702 m)   Wt 246 lb (111.6 kg)   BMI 38.53 kg/m   Orthostatic blood pressures: Supine: 137/87 mmHg, 83 BPM. --> Sitting 141/92 mmHg, 88 bpm --> initial standing 144/85 mmHg, 85 bpm --> 3 minutes standing 151/89 mmHg, 87 bpm General appearance: alert, cooperative, appears stated age, no distress. Moderate-morbidly obese HEENT: [place]/AT, EOMI, MMM, anicteric sclera Neck: no adenopathy, no carotid bruit and no JVD Lungs: clear to auscultation bilaterally, normal percussion bilaterally and non-labored Heart: regular rate and rhythm - with a intermittent ectopy, S1 &S2 normal, 2-3/6 HSM at the sternal border and apex. No other murmur, click, rub or gallop; unable to palpate PMI due to body habitus Abdomen: soft, non-tender; bowel sounds normal; no masses,  no organomegaly; no HJR Extremities: extremities normal, atraumatic, no cyanosis, and edema - trivial Pulses: 2+ and symmetric;  Skin: mobility and turgor normal, no evidence of bleeding or bruising and no lesions noted or  Neurologic: Mental status: Alert & oriented x 3, thought content appropriate; non-focal exam.  Pleasant mood & affect. Seems anxious, but has good insight; Cranial nerves: normal (II-XII grossly intact)    Adult ECG Report  Rate: 83 ;  Rhythm: normal sinus rhythm, premature ventricular contractions (PVC) and normal axis, intervals & durations;   Narrative  Interpretation: Otw Normal EKG   Other studies Reviewed: Additional studies/ records that were reviewed today include:  Recent Labs:  Recently ordered by PCP, but labs not available   ASSESSMENT / PLAN: Problem List Items Addressed This Visit    Abnormal EKG (Chronic)    PVCs noted on EKG here today. I heard some ectopy on exam, and she did  not notice them..      Bilateral leg cramps    She has leg cramps, currently taking atorvastatin. Given that I do not know her current lipid status, I will defer for now, but would suggest that she may benefit from a statin holiday.      Moderate mitral regurgitation (Chronic)    MR noted on echocardiogram several years ago. She does have a soft MR murmur on exam, but I do hear a midsystolic click. -> Plan: 2-D echocardiogram to reassess MR & possible mitral prolapse (not noted on last echo)      Relevant Orders   EKG 12-Lead   ECHOCARDIOGRAM COMPLETE   Palpitations - Primary    2 different symptoms, palpitations which sound like she's feeling or PVCs but also rapid heartbeat spells. Plan: Event monitor      Relevant Orders   EKG 12-Lead   ECHOCARDIOGRAM COMPLETE   Cardiac event monitor   PVC's (premature ventricular contractions) (Chronic)    PVCs noted on her EKG at her PCPs office and here. I suspect that those of the palpitations she is feeling, but that doesn't correlate with the rapid heartbeat spells. In order to know what exactly we are dealing with, I would need to have a monitor.  Plan: 30 event monitor. 2-D echogram to evaluate for any structural abnormalities.      Relevant Orders   EKG 12-Lead   ECHOCARDIOGRAM COMPLETE   Cardiac event monitor   Rapid heart rate    Rapid heart rate spells sound like they could be brief episodes of PAT or just simply sinus tachycardia. Sounds different than simply the palpitation spells that she's feeling which are probably PVCs. Plan: 30 day event monitor and 2-D echocardiogram.       Relevant Orders   EKG 12-Lead   Cardiac event monitor      Current medicines are reviewed at length with the patient today. (+/- concerns) N/A The following changes have been made: N/A  Patient Instructions  SCHEDULE BOTH  AT [place]  Your physician has recommended that you wear an event monitor 30 DAY. Event monitors are medical devices that record the heart's electrical activity. Doctors most often [place] these monitors to diagnose arrhythmias. Arrhythmias are problems with the speed or rhythm of the heartbeat. The monitor is a small, portable device. You can wear one while you do your normal daily activities. This is usually used to diagnose what is causing palpitations/syncope (passing out).     Your physician has requested that you have an echocardiogram. Echocardiography is a painless test that uses sound waves to create images of your heart. It provides your doctor with information about the size and shape of your heart and how well your heart's chambers and valves are working. This procedure takes approximately one hour. There are no restrictions for this procedure.   Your physician recommends that you schedule a follow-up appointment in 2 [place].    Studies Ordered:   Orders Placed This Encounter  Procedures  . Cardiac event monitor  . EKG 12-Lead  . ECHOCARDIOGRAM COMPLETE      Glenetta Hew, M.D., M.S. Interventional Cardiologist   Pager # [phone] Phone # [phone] [address] [place], [place] [address]

## 2016-07-30 NOTE — Patient Instructions (Signed)
SCHEDULE BOTH  AT [place]  Your physician has recommended that you wear an event monitor 30 DAY. Event monitors are medical devices that record the heart's electrical activity. Doctors most often [place] these monitors to diagnose arrhythmias. Arrhythmias are problems with the speed or rhythm of the heartbeat. The monitor is a small, portable device. You can wear one while you do your normal daily activities. This is usually used to diagnose what is causing palpitations/syncope (passing out).     Your physician has requested that you have an echocardiogram. Echocardiography is a painless test that uses sound waves to create images of your heart. It provides your doctor with information about the size and shape of your heart and how well your heart's chambers and valves are working. This procedure takes approximately one hour. There are no restrictions for this procedure.   Your physician recommends that you schedule a follow-up appointment in 2 [place].

## 2016-08-01 ENCOUNTER — Encounter: Payer: Self-pay | Admitting: Cardiology

## 2016-08-01 NOTE — Assessment & Plan Note (Addendum)
MR noted on echocardiogram several years ago. She does have a soft MR murmur on exam, but I do hear a midsystolic click. -> Plan: 2-D echocardiogram to reassess MR & possible mitral prolapse (not noted on last echo)

## 2016-08-01 NOTE — Assessment & Plan Note (Signed)
2 different symptoms, palpitations which sound like she's feeling or PVCs but also rapid heartbeat spells. Plan: Event monitor

## 2016-08-01 NOTE — Assessment & Plan Note (Signed)
PVCs noted on her EKG at her PCPs office and here. I suspect that those of the palpitations she is feeling, but that doesn't correlate with the rapid heartbeat spells. In order to know what exactly we are dealing with, I would need to have a monitor.  Plan: 30 event monitor. 2-D echogram to evaluate for any structural abnormalities.

## 2016-08-01 NOTE — Assessment & Plan Note (Signed)
She has leg cramps, currently taking atorvastatin. Given that I do not know her current lipid status, I will defer for now, but would suggest that she may benefit from a statin holiday.

## 2016-08-01 NOTE — Assessment & Plan Note (Signed)
Rapid heart rate spells sound like they could be brief episodes of PAT or just simply sinus tachycardia. Sounds different than simply the palpitation spells that she's feeling which are probably PVCs. Plan: 30 day event monitor and 2-D echocardiogram.

## 2016-08-01 NOTE — Assessment & Plan Note (Signed)
PVCs noted on EKG here today. I heard some ectopy on exam, and she did not notice them.Marland Kitchen

## 2016-08-08 HISTORY — PX: TRANSTHORACIC ECHOCARDIOGRAM: SHX275

## 2016-08-08 HISTORY — PX: OTHER SURGICAL HISTORY: SHX169

## 2016-08-19 ENCOUNTER — Other Ambulatory Visit (HOSPITAL_COMMUNITY): Payer: Medicare Other

## 2016-08-25 ENCOUNTER — Other Ambulatory Visit: Payer: Self-pay

## 2016-08-25 ENCOUNTER — Other Ambulatory Visit (HOSPITAL_COMMUNITY): Payer: Medicare Other

## 2016-08-25 ENCOUNTER — Ambulatory Visit (INDEPENDENT_AMBULATORY_CARE_PROVIDER_SITE_OTHER): Payer: Medicare Other

## 2016-08-25 ENCOUNTER — Ambulatory Visit (HOSPITAL_COMMUNITY): Payer: Medicare Other | Attending: Cardiology

## 2016-08-25 DIAGNOSIS — R000 Tachycardia, unspecified: Secondary | ICD-10-CM

## 2016-08-25 DIAGNOSIS — I340 Nonrheumatic mitral (valve) insufficiency: Secondary | ICD-10-CM

## 2016-08-25 DIAGNOSIS — R002 Palpitations: Secondary | ICD-10-CM

## 2016-08-25 DIAGNOSIS — I493 Ventricular premature depolarization: Secondary | ICD-10-CM

## 2016-08-30 ENCOUNTER — Telehealth: Payer: Self-pay | Admitting: Cardiology

## 2016-08-30 ENCOUNTER — Telehealth: Payer: Self-pay

## 2016-08-30 NOTE — Telephone Encounter (Signed)
duplicate

## 2016-08-30 NOTE — Telephone Encounter (Signed)
Follow up    Patient calling - asking that message be left on home voice mail.

## 2016-08-30 NOTE — Telephone Encounter (Signed)
[facility] called in with an alert. Patient was having tachycardia and PVC's. The patient has been called and is asymptomatic. The DOD has been made aware.  The patient has also been made aware of the ECHO results and verbalized her understanding. She is concerned about the cost of wearing the 30 day monitor and wants to know if she still needs to wear it or if enough information has been collected. Will route to the provider for his knowledge.

## 2016-08-30 NOTE — Telephone Encounter (Signed)
Patient returning call from Friday. Thanks.

## 2016-08-30 NOTE — Telephone Encounter (Signed)
Left a message for the patient to call back for the echo results:  2-D echo shows severe thickened left ventricle walls (goes along with high blood pressure) - associated with abnormal relaxation, grade 2 diastolic dysfunction which can explain shortness of breath.  The mitral valve has calcification with posterior leaflet prolapse and mild regurgitation.  As a result of the mitral regurgitation and diastolic dysfunction, the left atrium is moderately dilated.   MR does not look as bad as 2012.Marland Kitchen   Need adequate BP control.   Glenetta Hew, MD

## 2016-08-30 NOTE — Telephone Encounter (Signed)
Jim from [facility] is calling to report an abnormal EKG.  Report printed and given to Dr Martinique for review

## 2016-08-30 NOTE — Telephone Encounter (Signed)
New message     Clair Gulling from [facility] is calling to report an abnormal EKG.

## 2016-08-31 NOTE — Telephone Encounter (Signed)
I don't think terminating the 30 day monitor early changes the cost, if it does, and she has been having no symptoms for more than 2 weeks wearing it, I think if she wants to terminate - that would be okay if it reduces her cost. Unfortunately, I don't have any strips to review to know if I have not information or not. All I know is that this telephone call tells me she has some tachycardia and PVCs didn't say how much PVCs done say how fast tachycardia was or what type of tachycardia.  I am not in the office until next week, and therefore not be able to see anything that was not significant.  Glenetta Hew, MD

## 2016-09-01 NOTE — Telephone Encounter (Signed)
The patient has been called and instructed to keep wearing the monitor if at possible. If she finds out that it does cost more than she may remove it. She has been instructed to call us back if that is the case. She verbalized her understanding.

## 2016-10-01 ENCOUNTER — Encounter: Payer: Self-pay | Admitting: Cardiology

## 2016-10-01 ENCOUNTER — Ambulatory Visit (INDEPENDENT_AMBULATORY_CARE_PROVIDER_SITE_OTHER): Payer: Medicare Other | Admitting: Cardiology

## 2016-10-01 VITALS — BP 130/88 | HR 96 | Ht 67.0 in | Wt 248.4 lb

## 2016-10-01 DIAGNOSIS — R0600 Dyspnea, unspecified: Secondary | ICD-10-CM | POA: Insufficient documentation

## 2016-10-01 DIAGNOSIS — I493 Ventricular premature depolarization: Secondary | ICD-10-CM | POA: Diagnosis not present

## 2016-10-01 DIAGNOSIS — I340 Nonrheumatic mitral (valve) insufficiency: Secondary | ICD-10-CM

## 2016-10-01 DIAGNOSIS — R9431 Abnormal electrocardiogram [ECG] [EKG]: Secondary | ICD-10-CM

## 2016-10-01 DIAGNOSIS — I472 Ventricular tachycardia: Secondary | ICD-10-CM | POA: Diagnosis not present

## 2016-10-01 DIAGNOSIS — R0609 Other forms of dyspnea: Secondary | ICD-10-CM

## 2016-10-01 DIAGNOSIS — I4729 Other ventricular tachycardia: Secondary | ICD-10-CM | POA: Insufficient documentation

## 2016-10-01 MED ORDER — METOPROLOL TARTRATE 25 MG PO TABS: 25.0000 mg | ORAL_TABLET | Freq: Two times a day (BID) | ORAL | 3 refills | Status: DC

## 2016-10-01 NOTE — Progress Notes (Signed)
PCP: Rebecca Skeans, PA-C  Clinic Note: Chief Complaint  Patient presents with  . Follow-up    palpitations - Event monitory & Echo  . Palpitations  . Heart Murmur    HPI:Rebecca Carr is a 66 y.o. female who is being seen today for Follow-up evaluation of palpitations and heart murmur EKG at the request of Rebecca Skeans, PA-C.  Rebecca Carr was Initially seen on 07/30/2016.   Recent Hospitalizations: none  Studies Personally Reviewed - (if available, images/films reviewed: From Epic Chart or Care Everywhere)  2-D echo 08/26/2011: EF 60-65% with severe LVH. GR 2 DD. Calcified mitral apparatus with mild posterior leaflet prolapse and mild mitral regurgitation. Moderate LA dilation.  Event Monitor - SR, Tach. Frequent PVCs with some couplets & Bigeminy.  2 short episodes of NSVT (6 & ~15beats)   Interval History: Mrs. Easily returns today to discuss the results of her tests.  She still notes having palpitations, but no associated dyspnea or chest discomfort.  She has notably reduced her caffeine & chocolate as well as sweets - this has had a notable effect with improvement - but she misses the "pick-me up from coffee in the AM.".  She still has exertional dyspnea that she pretty much attributes to her COPD.  Not really noting the dizziness as much with resolution of labyrinthitis. No further near-syncope episodes with the "bright light" sensation.    No chest pain or shortness of breath with rest or exertion. No PND, orthopnea or edema. No palpitations, lightheadedness, dizziness, weakness or syncope/near syncope. No TIA/amaurosis fugax symptoms. No claudication.  ROS: A comprehensive was performed. Review of Systems  Constitutional: Positive for malaise/fatigue (still notes difficulty getting going in the AM.).  HENT: Negative for nosebleeds.   Respiratory: Positive for shortness of breath. Negative for cough and wheezing.   Cardiovascular:       Per HPI    Gastrointestinal: Positive for abdominal pain (cramping in the upper abdomen). Negative for blood in stool and melena.  Genitourinary: Negative for dysuria and hematuria.  Musculoskeletal: Positive for joint pain. Negative for falls.       Night cramps  Neurological: Positive for dizziness (much improved & not associated wiht palpitation. ).  Psychiatric/Behavioral: Positive for depression. Negative for memory loss. The patient does not have insomnia.   All other systems reviewed and are negative.  I have reviewed and (if needed) personally updated the patient's problem list, medications, allergies, past medical and surgical history, social and family history.   Past Medical History:  Diagnosis Date  . Anxiety   . Arthritis   . DES exposure in utero   . Diverticulosis of colon   . Dizziness   . Environmental and seasonal allergies   . GERD (gastroesophageal reflux disease)   . Heart palpitations   . Hiatal hernia   . History of adenomatous polyp of colon    hyperplastic 2015  and tubular adenoma 2005  . History of esophageal dilatation    of stricture  . History of esophagitis    LA class , grade B and Gastritis  . History of pituitary tumor    dx 1990's - asymptomatic /  per pt last scan 2005 approx.  resolved   . Hyperlipidemia   . Hypothyroidism   . Leg pain, bilateral   . Mild intermittent asthma   . Moderate mitral regurgitation by prior echocardiography 2012   1-2 MR on Echo  . MTHFR mutation ([place])   . MVP (mitral valve  prolapse)    per pt dx 1980 approx.  -- asymptomatic; Not seen on 2012 Echo  . OSA (obstructive sleep apnea)    non-compliant cpap  . Prediabetes   . SUI (stress urinary incontinence, female)   . VIN III (vulvar intraepithelial neoplasia III)   . Wears glasses     Past Surgical History:  Procedure Laterality Date  . CESAREAN SECTION  1994  . CO2 LASER APPLICATION N/A 2/99/2426   Procedure: CO2 LASER APPLICATION ;  Surgeon: Everitt Amber, MD;   Location: [facility];  Service: Gynecology;  Laterality: N/A;  . COLONOSCOPY, ESOPHAGOGASTRODUODENOSCOPY (EGD) AND ESOPHAGEAL DILATION  10-04-2013  . LAPAROSCOPIC CHOLECYSTECTOMY  1995  . LASER ABLATION CONDOLAMATA N/A 07/03/2015   Procedure: CO2 LASER OF VULVA ;  Surgeon: Everitt Amber, MD;  Location: [facility];  Service: Gynecology;  Laterality: N/A;  . TRANSTHORACIC ECHOCARDIOGRAM  11/2010   Normal LV Size & Fxn - EF 55-60%. Mild-Mod MR. Normal DF.    Current Meds  Medication Sig  . ALBUTEROL IN Inhale into the lungs as needed.  Marland Kitchen aspirin EC 81 MG tablet Take 81 mg by mouth daily. Reported on 07/16/2015  . atorvastatin (LIPITOR) 40 MG tablet Take 20 mg by mouth every evening.   Marland Kitchen azelastine (ASTELIN) 0.1 % nasal spray Place 2 sprays into both nostrils daily.  . busPIRone (BUSPAR) 15 MG tablet Take 15 mg by mouth 3 (three) times daily as needed.   . fluticasone (FLONASE) 50 MCG/ACT nasal spray Place 2 sprays into both nostrils every morning.  Marland Kitchen levothyroxine (SYNTHROID, LEVOTHROID) 137 MCG tablet Take 137 mcg by mouth daily before breakfast.  . nicotine polacrilex (NICORETTE) 4 MG gum Take 2 mg by mouth as needed for smoking cessation.   Marland Kitchen omeprazole (PRILOSEC) 40 MG capsule TAKE ONE CAPSULE BY MOUTH DAILY BEFORE BREAKFAST (Patient taking differently: TAKE ONE CAPSULE BY MOUTH DAILY BEFORE BREAKFAst---  pt takes at noon)    Allergies  Allergen Reactions  . Iodine Anaphylaxis  . Iohexol Anaphylaxis     Desc: Patient states she is allergic to iodine and had a "code blue" incident after an injection for some type of imaging study.   . Penicillins Rash    PCN IN LARGE DOSES.    Social History   Social History  . Marital status: Married    Spouse name: Rebecca Carr  . Number of children: 1  . Years of education: N/A   Occupational History  . 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 Smoking status: Former Smoker      Packs/day: 1.00    Years: 48.00    Types: Cigarettes    Quit date: 05/10/2014  . Smokeless tobacco: Never Used     Comment: using nicorrette  . Alcohol use 1.2 oz/week    2 Standard drinks or equivalent per week     Comment: Social - > is now essentially quit  . Drug use: No  . Sexual activity: Not Asked   Other Topics Concern  . None   Social History Narrative   No routine exercise. Describes herself as "lazy "    family history includes COPD in her brother; Colon cancer in her father and maternal grandfather; Dementia in her brother; Heart attack in her brother and mother; Heart disease in her mother; Pancreatic cancer in her father.  Wt Readings from Last 3 Encounters:  10/01/16 248 lb 6.4 oz (  112.7 kg)  07/30/16 246 lb (111.6 kg)  09/17/15 228 lb 11.2 oz (103.7 kg)    PHYSICAL EXAM BP 130/88   Pulse 96   Ht 5\' 7"  (1.702 m)   Wt 248 lb 6.4 oz (112.7 kg)   SpO2 98%   BMI 38.90 kg/m  Physical Exam  Constitutional: She is oriented to person, place, and time. She appears well-developed and well-nourished. No distress.  Moderate-morbidly obese  HENT:  Head: Normocephalic and atraumatic.  Eyes: EOM are normal.  Neck: Neck supple. No hepatojugular reflux and no JVD present. Carotid bruit is not present.  Cardiovascular: Normal rate, regular rhythm, intact distal pulses and normal pulses.  Exam reveals no gallop and no friction rub.   Murmur heard. High-pitched harsh crescendo-decrescendo midsystolic murmur is present with a grade of 1/6  at the upper right sternal border  Blowing plateau holosystolic murmur of grade 2/6 is also present. Unable to palpate PMI  Pulmonary/Chest: Effort normal and breath sounds normal. No respiratory distress. She has no rales.  Distant breath sounds  Abdominal: Soft. Bowel sounds are normal. She exhibits no distension. There is no rebound.  Obese  Musculoskeletal: Normal range of motion. She exhibits edema (trivial).  Neurological: She  is alert and oriented to person, place, and time.  Skin: No erythema.  Psychiatric: She has a normal mood and affect. Her behavior is normal. Thought content normal.  Nursing note and vitals reviewed.  Adult ECG Report n/a  Other studies Reviewed: Additional studies/ records that were reviewed today include:  Recent Labs:      ASSESSMENT / PLAN: Problem List Items Addressed This Visit    Abnormal EKG (Chronic)   Relevant Orders   Comprehensive metabolic panel   TSH   Magnesium   Myocardial Perfusion Imaging   Exertional dyspnea    She is obese & has COPD - mostly likely related to this, but with PVCs & NSVT.  Echo was relatively normal with the exception of Gr 2 DD.  Will need to exclude CAD -- Myoview.      Relevant Orders   Comprehensive metabolic panel   TSH   Magnesium   Myocardial Perfusion Imaging   Moderate mitral regurgitation - Primary (Chronic)    Echo now with less prominent MR (mild vs. Moderate). - audible on exam, but not likely to be related to dyspnea.   Need to monitor with Echo in ~1-2 yrs      Relevant Medications   metoprolol tartrate (LOPRESSOR) 25 MG tablet   Other Relevant Orders   Comprehensive metabolic panel   TSH   Magnesium   Myocardial Perfusion Imaging   NSVT (nonsustained ventricular tachycardia) (HCC)    Noted on Sx log as palpitations but no note of dyspnea, dizziness or near syncope.  Need to exclude ischemic heart disease - Myoview ST. - check Chem Panel /Electrolytes as well as TSH - add low dose Metoprolol BID      Relevant Medications   metoprolol tartrate (LOPRESSOR) 25 MG tablet   Other Relevant Orders   Comprehensive metabolic panel   TSH   Magnesium   Myocardial Perfusion Imaging   PVC's (premature ventricular contractions) (Chronic)    Frequent PVCs noted on monitor with short NSVT & bigeminy, pairs & triplets -->  Need to exclude ischemia - check Myoview      Relevant Medications   metoprolol tartrate  (LOPRESSOR) 25 MG tablet   Other Relevant Orders   Comprehensive metabolic panel   TSH  Magnesium   Myocardial Perfusion Imaging      Current medicines are reviewed at length with the patient today. (+/- concerns) none The following changes have been made: add Metoprolol 25 mg BID  Patient Instructions  LABS- PLEASE DO  YOU DO NOT HAVE TO BE FASTING. CMP MAGNESIUM TSH   MEDICATION  START METOPROLOL TARTRATE 25 MG ONE TABLET TWICE A DAY    [address] Your physician has requested that you have en exercise stress myoview. For further information please visit [email or web address]. Please follow instruction sheet, as given.    Your physician recommends that you schedule a follow-up appointment in 3 MONTHS WITH DR Orhan Mayorga - F/U TEST IF NORMAL, IF ABNORMAL WILL CALL TO MAKE APPOINTMENT SOONER.   If you need a refill on your cardiac medications before your next appointment, please call your pharmacy.     Studies Ordered:   Orders Placed This Encounter  Procedures  . Comprehensive metabolic panel  . TSH  . Magnesium  . Myocardial Perfusion Imaging      Glenetta Hew, M.D., M.S. Interventional Cardiologist   Pager # [phone] Phone # [phone] [address] [place], [place] [address]

## 2016-10-01 NOTE — Patient Instructions (Signed)
LABS- PLEASE DO  YOU DO NOT HAVE TO BE FASTING. CMP MAGNESIUM TSH   MEDICATION  START METOPROLOL TARTRATE 25 MG ONE TABLET TWICE A DAY    [address] Your physician has requested that you have en exercise stress myoview. For further information please visit [email or web address]. Please follow instruction sheet, as given.    Your physician recommends that you schedule a follow-up appointment in 3 MONTHS WITH DR HARDING - F/U TEST IF NORMAL, IF ABNORMAL WILL CALL TO MAKE APPOINTMENT SOONER.   If you need a refill on your cardiac medications before your next appointment, please call your pharmacy.

## 2016-10-03 NOTE — Assessment & Plan Note (Signed)
Frequent PVCs noted on monitor with short NSVT & bigeminy, pairs & triplets -->  Need to exclude ischemia - check Myoview

## 2016-10-03 NOTE — Assessment & Plan Note (Signed)
Echo now with less prominent MR (mild vs. Moderate). - audible on exam, but not likely to be related to dyspnea.   Need to monitor with Echo in ~1-2 yrs

## 2016-10-03 NOTE — Assessment & Plan Note (Signed)
She is obese & has COPD - mostly likely related to this, but with PVCs & NSVT.  Echo was relatively normal with the exception of Gr 2 DD.  Will need to exclude CAD -- Myoview.

## 2016-10-03 NOTE — Assessment & Plan Note (Addendum)
Noted on Sx log as palpitations but no note of dyspnea, dizziness or near syncope.  Need to exclude ischemic heart disease - Myoview ST. - check Chem Panel /Electrolytes as well as TSH - add low dose Metoprolol BID

## 2016-10-04 ENCOUNTER — Telehealth: Payer: Self-pay | Admitting: Cardiology

## 2016-10-04 NOTE — Telephone Encounter (Signed)
Returned the call to the patient. She stated that she had several items she wanted Dr. Ellyn Hack to know or for his recommendation:  1. She stated that she had told Dr. Ellyn Hack that she has COPD which in fact she does not. She does have sleep apnea but does not use her CPAP machine.  2. She would like to know if she should continue her 81 mg aspirin.   3. She has not started her Metoprolol because of the bad things she has read on the Internet. She has been advised to go ahead and start the medciation and that everything that is put on the Internet is not always correct. If she has specific concerns that she may always call the office and we would be glad to discuss the concerns with her. She is going [place] vine[place] this weekend and would like to be able to drink wine and wonders if that will be okay. She has been advised that alcohol will intensify any side effects from the medication and it is not generally advised. She verbalized her understandnig.

## 2016-10-04 NOTE — Telephone Encounter (Signed)
Patient calling, states that she has a few concerns to discuss. Please call, thanks.

## 2016-10-04 NOTE — Telephone Encounter (Signed)
Agree with Lattie Haw.  Glenetta Hew, MD

## 2016-10-06 NOTE — Telephone Encounter (Signed)
Follow up    Pt is following up to her call from Monday

## 2016-10-06 NOTE — Telephone Encounter (Signed)
SPOKE TO PATIENT . AWARE VERBALIZED UNDERSTANDING.

## 2016-10-06 NOTE — Telephone Encounter (Signed)
Probably glass or 2 of wine would be okay, but I do agree that until we know what the nuclear stress test shows, would probably be better to avoid doing a daily would complicate issues. I would recommend taking baby aspirin since we are looking for cardiac artery disease.  Glenetta Hew, MD

## 2016-10-06 NOTE — Telephone Encounter (Signed)
Pt notified Dr Lemmie Evens is in agreement w/prev direction. Pt is want to know if she should continue taking her 81mg  ASA.  Also, She is going to a vineyard this weekend and would like to be able to drink wine and wonders if that will be okay. She has been advised that alcohol will intensify any side effects from the medication and it is not generally advised. She verbalized her understanding. She still states that she wants "to know for sure"  Please advise

## 2016-10-09 HISTORY — PX: NM MYOVIEW LTD: HXRAD82

## 2016-10-13 DIAGNOSIS — H1032 Unspecified acute conjunctivitis, left eye: Secondary | ICD-10-CM | POA: Diagnosis not present

## 2016-10-14 ENCOUNTER — Telehealth (HOSPITAL_COMMUNITY): Payer: Self-pay

## 2016-10-14 NOTE — Telephone Encounter (Signed)
Encounter complete. 

## 2016-10-19 ENCOUNTER — Ambulatory Visit (HOSPITAL_COMMUNITY)
Admission: RE | Admit: 2016-10-19 | Discharge: 2016-10-19 | Disposition: A | Discharge status: Home / Self Care | Payer: Medicare Other | Source: Ambulatory Visit | Attending: Cardiovascular Disease | Admitting: Cardiovascular Disease

## 2016-10-19 DIAGNOSIS — R9431 Abnormal electrocardiogram [ECG] [EKG]: Secondary | ICD-10-CM | POA: Diagnosis not present

## 2016-10-19 DIAGNOSIS — I493 Ventricular premature depolarization: Secondary | ICD-10-CM

## 2016-10-19 DIAGNOSIS — I472 Ventricular tachycardia: Secondary | ICD-10-CM

## 2016-10-19 DIAGNOSIS — R0609 Other forms of dyspnea: Secondary | ICD-10-CM

## 2016-10-19 DIAGNOSIS — I340 Nonrheumatic mitral (valve) insufficiency: Secondary | ICD-10-CM | POA: Insufficient documentation

## 2016-10-19 DIAGNOSIS — I4729 Other ventricular tachycardia: Secondary | ICD-10-CM

## 2016-10-19 DIAGNOSIS — R0600 Dyspnea, unspecified: Secondary | ICD-10-CM

## 2016-10-19 MED ORDER — REGADENOSON 0.4 MG/5ML IV SOLN
0.4000 mg | Freq: Once | INTRAVENOUS | Status: AC
  Administered 2016-10-19: 0.4 mg via INTRAVENOUS

## 2016-10-19 MED ORDER — TECHNETIUM TC 99M TETROFOSMIN IV KIT
28.2000 | PACK | Freq: Once | INTRAVENOUS | Status: AC | PRN
  Administered 2016-10-19: 28.2 via INTRAVENOUS
  Filled 2016-10-19: qty 29

## 2016-10-20 ENCOUNTER — Ambulatory Visit (HOSPITAL_COMMUNITY)
Admission: RE | Admit: 2016-10-20 | Discharge: 2016-10-20 | Disposition: A | Discharge status: Home / Self Care | Payer: Medicare Other | Source: Ambulatory Visit | Attending: Cardiovascular Disease | Admitting: Cardiovascular Disease

## 2016-10-20 LAB — MYOCARDIAL PERFUSION IMAGING
CHL CUP RESTING HR STRESS: 75 {beats}/min
LV sys vol: 73 mL
LVDIAVOL: 159 mL (ref 46–106)
Peak HR: 105 {beats}/min
SDS: 0
SRS: 4
SSS: 4
TID: 0.9

## 2016-10-20 MED ORDER — TECHNETIUM TC 99M TETROFOSMIN IV KIT
30.4000 | PACK | Freq: Once | INTRAVENOUS | Status: AC | PRN
  Administered 2016-10-20: 30.4 via INTRAVENOUS

## 2016-10-21 ENCOUNTER — Telehealth: Payer: Self-pay | Admitting: *Deleted

## 2016-10-21 NOTE — Telephone Encounter (Signed)
Follow up     Pt is calling back for [facility].

## 2016-10-21 NOTE — Telephone Encounter (Signed)
-----   Message from Leonie Man, MD sent at 10/21/2016  2:56 PM EDT ----- Good news. Stress test did not show any signs of heart artery blockages. Normal pump function.  Glenetta Hew, MD

## 2016-10-21 NOTE — Telephone Encounter (Signed)
Called . Unable to leave message mail box fulled

## 2016-10-21 NOTE — Telephone Encounter (Signed)
Spoke to patient. Result given . Verbalized understanding  

## 2016-10-29 DIAGNOSIS — H109 Unspecified conjunctivitis: Secondary | ICD-10-CM | POA: Diagnosis not present

## 2016-10-29 DIAGNOSIS — J0100 Acute maxillary sinusitis, unspecified: Secondary | ICD-10-CM | POA: Diagnosis not present

## 2016-11-03 DIAGNOSIS — Z23 Encounter for immunization: Secondary | ICD-10-CM | POA: Diagnosis not present

## 2016-11-14 DIAGNOSIS — E079 Disorder of thyroid, unspecified: Secondary | ICD-10-CM | POA: Diagnosis not present

## 2016-11-14 DIAGNOSIS — E785 Hyperlipidemia, unspecified: Secondary | ICD-10-CM | POA: Diagnosis not present

## 2016-11-14 DIAGNOSIS — J984 Other disorders of lung: Secondary | ICD-10-CM | POA: Diagnosis not present

## 2016-11-14 DIAGNOSIS — Z880 Allergy status to penicillin: Secondary | ICD-10-CM | POA: Diagnosis not present

## 2016-11-14 DIAGNOSIS — Z79899 Other long term (current) drug therapy: Secondary | ICD-10-CM | POA: Diagnosis not present

## 2016-11-14 DIAGNOSIS — Z7982 Long term (current) use of aspirin: Secondary | ICD-10-CM | POA: Diagnosis not present

## 2016-11-14 DIAGNOSIS — Z888 Allergy status to other drugs, medicaments and biological substances status: Secondary | ICD-10-CM | POA: Diagnosis not present

## 2016-11-14 DIAGNOSIS — R079 Chest pain, unspecified: Secondary | ICD-10-CM | POA: Diagnosis not present

## 2016-11-14 DIAGNOSIS — R221 Localized swelling, mass and lump, neck: Secondary | ICD-10-CM | POA: Diagnosis not present

## 2016-11-14 DIAGNOSIS — J029 Acute pharyngitis, unspecified: Secondary | ICD-10-CM | POA: Diagnosis not present

## 2017-01-03 ENCOUNTER — Ambulatory Visit (INDEPENDENT_AMBULATORY_CARE_PROVIDER_SITE_OTHER): Payer: Medicare Other | Admitting: Cardiology

## 2017-01-03 ENCOUNTER — Encounter: Payer: Self-pay | Admitting: Cardiology

## 2017-01-03 VITALS — BP 120/80 | HR 76 | Ht 68.0 in | Wt 254.0 lb

## 2017-01-03 DIAGNOSIS — I5189 Other ill-defined heart diseases: Secondary | ICD-10-CM | POA: Diagnosis not present

## 2017-01-03 DIAGNOSIS — I472 Ventricular tachycardia: Secondary | ICD-10-CM

## 2017-01-03 DIAGNOSIS — I493 Ventricular premature depolarization: Secondary | ICD-10-CM | POA: Diagnosis not present

## 2017-01-03 DIAGNOSIS — R0609 Other forms of dyspnea: Secondary | ICD-10-CM

## 2017-01-03 DIAGNOSIS — I5032 Chronic diastolic (congestive) heart failure: Secondary | ICD-10-CM | POA: Insufficient documentation

## 2017-01-03 DIAGNOSIS — I4729 Other ventricular tachycardia: Secondary | ICD-10-CM

## 2017-01-03 DIAGNOSIS — I340 Nonrheumatic mitral (valve) insufficiency: Secondary | ICD-10-CM | POA: Diagnosis not present

## 2017-01-03 DIAGNOSIS — R000 Tachycardia, unspecified: Secondary | ICD-10-CM | POA: Diagnosis not present

## 2017-01-03 DIAGNOSIS — R0600 Dyspnea, unspecified: Secondary | ICD-10-CM

## 2017-01-03 MED ORDER — METOPROLOL TARTRATE 25 MG PO TABS
ORAL_TABLET | ORAL | 3 refills | Status: DC
Start: 2017-01-03 — End: 2017-09-26

## 2017-01-03 NOTE — Patient Instructions (Signed)
Medication Instructions:  DECREASE metoprolol to 12.5 mg (1/2 tablet) in the AM and continue 25 mg (1 tablet) in the evening.  Labwork: TODAY ([facility])  Follow-Up: Your physician wants you to follow-up in: 6 months with Dr. Ellyn Hack. You will receive a reminder letter in the mail two months in advance. If you don't receive a letter, please call our office to schedule the follow-up appointment.   Any Other Special Instructions Will Be Listed Below (If Applicable).     If you need a refill on your cardiac medications before your next appointment, please call your pharmacy.

## 2017-01-03 NOTE — Progress Notes (Signed)
PCP: Starlyn Skeans, PA-C, Dr. Juleen China  Clinic Note: Chief Complaint  Patient presents with  . Follow-up    Study results  . Shortness of Breath    Poor energy   HPI:Rebecca Carr is a 66 y.o. female who is being seen today for Follow-up evaluation of palpitations and heart murmur EKG at the request of Starlyn Skeans, PA-C.  [name] was last seen on 8/24 to discuss the results of her echocardiogram and event monitor.  The echocardiogram suggested mild mitral prolapse with mild MR.  Event monitor was otherwise benign with the exception of 2 short runs of nonsustained V. tach.  We therefore ordered a Myoview stress test and she now presents to follow-up the results.  Recent Hospitalizations: none  Studies Personally Reviewed - (if available, images/films reviewed: From Epic Chart or Care Everywhere)  Myoview 10/21/2016: LOW RISK.  No ischemia or infarction.  EF 54%.  Interval History: Rebecca Carr returns today to discuss the results of her tests noting that overall, her energy level is still down.  She actually seems to be noticing worse fatigue than last visit.  Her palpitations however seem to have improved and are now nominal.  She states that her exertional dyspnea is most notable going up steps or up a hill.  She notes more bilateral shoulder pain than any chest discomfort with rest or exertion.  She points out that she said COPD last visit, but actually meant OSA and not COPD.  She has had long-standing OSA but does not tolerate CPAP. She finds her energy level very low, and spends a lot of the time at night getting up to go to bathroom.  She is still trying avoid her morning "pick me up of coffee, and that tends to make energy level worse as well. She has not had any further syncope or near syncopal episodes.   She denies any true chest tightness pressure at rest or exertion. She really denies any PND, orthopnea or edema. No TIA or amaurosis fugax symptoms. No  claudication  GERD better - less cough with more frequent PPI.   ROS: A comprehensive was performed. Review of Systems  Constitutional: Positive for malaise/fatigue (still notes difficulty getting going in the AM.).  HENT: Negative for nosebleeds.   Respiratory: Positive for shortness of breath. Negative for cough and wheezing.   Cardiovascular: Positive for palpitations (much less ). Negative for chest pain and leg swelling.       Per HPI  Gastrointestinal: Positive for abdominal pain (cramping in the upper abdomen). Negative for blood in stool and melena.  Genitourinary: Negative for dysuria and hematuria.  Musculoskeletal: Positive for joint pain. Negative for falls and myalgias (rare night cramps).  Neurological: Negative for dizziness (much improved & not associated wiht palpitation. ).  Psychiatric/Behavioral: Positive for depression. Negative for memory loss. The patient does not have insomnia.   All other systems reviewed and are negative.  I have reviewed and (if needed) personally updated the patient's problem list, medications, allergies, past medical and surgical history, social and family history.   Past Medical History:  Diagnosis Date  . Anxiety   . Arthritis   . DES exposure in utero   . Diverticulosis of colon   . Dizziness   . Environmental and seasonal allergies   . GERD (gastroesophageal reflux disease)   . Heart palpitations   . Hiatal hernia   . History of adenomatous polyp of colon    hyperplastic 2015  and tubular  adenoma 2005  . History of esophageal dilatation    of stricture  . History of esophagitis    LA class , grade B and Gastritis  . History of pituitary tumor    dx 1990's - asymptomatic /  per pt last scan 2005 approx.  resolved   . Hyperlipidemia   . Hypothyroidism   . Leg pain, bilateral   . Mild intermittent asthma   . Moderate mitral regurgitation by prior echocardiography 2012   1-2 MR on Echo  . MTHFR mutation ([place])   . MVP (mitral  valve prolapse)    per pt dx 1980 approx.  -- asymptomatic; Not seen on 2012 Echo  . OSA (obstructive sleep apnea)    non-compliant cpap  . Prediabetes   . SUI (stress urinary incontinence, female)   . VIN III (vulvar intraepithelial neoplasia III)   . Wears glasses     Past Surgical History:  Procedure Laterality Date  . CESAREAN SECTION  1994  . CO2 LASER APPLICATION N/A 8/52/7782   Procedure: CO2 LASER APPLICATION ;  Surgeon: Everitt Amber, MD;  Location: [facility];  Service: Gynecology;  Laterality: N/A;  . COLONOSCOPY, ESOPHAGOGASTRODUODENOSCOPY (EGD) AND ESOPHAGEAL DILATION  10-04-2013  . LAPAROSCOPIC CHOLECYSTECTOMY  1995  . LASER ABLATION CONDOLAMATA N/A 07/03/2015   Procedure: CO2 LASER OF VULVA ;  Surgeon: Everitt Amber, MD;  Location[facility];  Service: Gynecology;  Laterality: N/A;  . TRANSTHORACIC ECHOCARDIOGRAM  11/2010   Normal LV Size & Fxn - EF 55-60%. Mild-Mod MR. Normal DF.    2-D echo 08/25/2016: EF 60-65% with severe LVH. GR 2 DD. Calcified mitral apparatus with mild posterior leaflet prolapse and mild mitral regurgitation. Moderate LA dilation.  Event Monitor - SR, Tach. Frequent PVCs with some couplets & Bigeminy.  2 short episodes of NSVT (6 & ~15beats)    Current Meds  Medication Sig  . ALBUTEROL IN Inhale into the lungs as needed.  Marland Kitchen aspirin EC 81 MG tablet Take 81 mg by mouth daily. Reported on 07/16/2015  . atorvastatin (LIPITOR) 40 MG tablet Take 20 mg by mouth every evening.   Marland Kitchen azelastine (ASTELIN) 0.1 % nasal spray Place 2 sprays into both nostrils daily.  . busPIRone (BUSPAR) 15 MG tablet Take 15 mg by mouth 3 (three) times daily as needed.   . fluticasone (FLONASE) 50 MCG/ACT nasal spray Place 2 sprays into both nostrils every morning.  Marland Kitchen levothyroxine (SYNTHROID, LEVOTHROID) 137 MCG tablet Take 137 mcg by mouth daily before breakfast.  . metoprolol tartrate (LOPRESSOR) 25 MG tablet Take 0.5 tablets (12.5 mg total) by  mouth every morning AND 1 tablet (25 mg total) every evening.  . nicotine polacrilex (NICORETTE) 4 MG gum Take 2 mg by mouth as needed for smoking cessation.   Marland Kitchen omeprazole (PRILOSEC) 40 MG capsule TAKE ONE CAPSULE BY MOUTH DAILY BEFORE BREAKFAST (Patient taking differently: TAKE ONE CAPSULE BY MOUTH DAILY BEFORE BREAKFAst---  pt takes at noon)  . [DISCONTINUED] metoprolol tartrate (LOPRESSOR) 25 MG tablet Take 1 tablet (25 mg total) by mouth 2 (two) times daily.    Allergies  Allergen Reactions  . Iodine Anaphylaxis  . Iohexol Anaphylaxis     Desc: Patient states she is allergic to iodine and had a "code blue" incident after an injection for some type of imaging study.   . Penicillins Rash    PCN IN LARGE DOSES.    Social History   Socioeconomic History  . Marital status: Married  Spouse name: Jenny Reichmann  . Number of children: 1  . Years of education: None  . Highest education level: None  Social Needs  . Financial resource strain: None  . Food insecurity - worry: None  . Food insecurity - inability: None  . Transportation needs - medical: None  . Transportation needs - non-medical: None  Occupational History  . Occupation: CUSTOMER SERVICE    Employer: [employer]    Comment: Is about to retire  Tobacco Use  . Smoking status: Former Smoker    Packs/day: 1.00    Years: 48.00    Pack years: 48.00    Types: Cigarettes    Last attempt to quit: 05/10/2014    Years since quitting: 2.6  . Smokeless tobacco: Never Used  . Tobacco comment: using nicorrette  Substance and Sexual Activity  . Alcohol use: Yes    Alcohol/week: 1.2 oz    Types: 2 Standard drinks or equivalent per week    Comment: Social - > is now essentially quit  . Drug use: No  . Sexual activity: None  Other Topics Concern  . None  Social History Narrative   No routine exercise. Describes herself as "lazy "    family history includes COPD in her brother; Colon cancer in her father and maternal grandfather;  Dementia in her brother; Heart attack in her brother and mother; Heart disease in her mother; Pancreatic cancer in her father.  Wt Readings from Last 3 Encounters:  01/03/17 254 lb (115.2 kg)  10/19/16 248 lb (112.5 kg)  10/01/16 248 lb 6.4 oz (112.7 kg)    PHYSICAL EXAM BP 120/80   Pulse 76   Ht 5\' 8"  (1.727 m)   Wt 254 lb (115.2 kg)   SpO2 95%   BMI 38.62 kg/m  Physical Exam  Constitutional: She is oriented to person, place, and time. She appears well-developed and well-nourished. No distress.  Moderate-morbidly obese  HENT:  Head: Normocephalic and atraumatic.  Eyes: EOM are normal.  Neck: Neck supple. No hepatojugular reflux and no JVD present. Carotid bruit is not present.  Cardiovascular: Normal rate, regular rhythm, intact distal pulses and normal pulses. Exam reveals no gallop and no friction rub.  Murmur heard. High-pitched harsh crescendo-decrescendo midsystolic murmur is present with a grade of 1/6 at the upper right sternal border.  Blowing plateau holosystolic murmur of grade 2/6 is also present. Unable to palpate PMI  Pulmonary/Chest: Effort normal and breath sounds normal. No respiratory distress. She has no rales.  Distant breath sounds  Abdominal: Soft. Bowel sounds are normal. She exhibits no distension. There is no rebound.  Obese  Musculoskeletal: Normal range of motion. She exhibits edema (trivial).  Neurological: She is alert and oriented to person, place, and time.  Skin: No erythema.  Psychiatric: She has a normal mood and affect. Her behavior is normal. Thought content normal.  Nursing note and vitals reviewed.  Adult ECG Report n/a  Other studies Reviewed: Additional studies/ records that were reviewed today include:  Recent Labs:  Checked today -were actually ordered to be done prior to this visit.  Need to ensure normal electrolytes with no sustained V. tach. Lab Results  Component Value Date   CREATININE 0.77 01/03/2017   BUN 12 01/03/2017     NA 142 01/03/2017   K 4.8 01/03/2017   CL 104 01/03/2017   CO2 22 01/03/2017   ASSESSMENT / PLAN: Problem List Items Addressed This Visit    Diastolic dysfunction without heart failure  Truly euvolemic on exam.  Blood pressure relatively well controlled making grade 2 diastolic dysfunction interesting concept.  Perhaps her severe concentric LVH is related to a cardiomyopathy.  May need to consider cardiac MRI. I do not think the extent of mitral regurgitation correlates with dyspnea. Plan: At this point her blood pressure seems to be well controlled on minimal dose of beta-blocker.  As we wean down the beta-blocker, may consider adding afterload reduction.      Exertional dyspnea    Very difficult to determine what her true dyspnea is related to.  She has relative normal EF on echo and negative Myoview for as far as any ischemia.  She does not have COPD as mentioned last week, but does have OSA and potentially OSH. She does have grade 2 diastolic dysfunction, but is relatively euvolemic. Plan: Discussed importance of weight loss. Reducing morning dose of beta-blocker to allow for better energy level. Continue to recommend exercise.      Moderate mitral regurgitation - Primary (Chronic)    Truly only mild to moderate mitral regurgitation noted on last echo. Plan will be to follow-up probably in 2 years unless murmur worsens.      Relevant Medications   metoprolol tartrate (LOPRESSOR) 25 MG tablet   NSVT (nonsustained ventricular tachycardia) (HCC)    No symptoms.  Noted on monitor, but nonischemic Myoview and normal EF on echo. She did not seem to tolerate 25 twice daily metoprolol so we will reduce the morning dose of 12.5 mg.  -Reorder chemistry panelbe done prior to this visit.  Need to evaluate potassium and magnesium levels.  Unlikely to be ischemic in nature.  Continue to monitor.  As long as they are not prolonged and she is not systematic, likely benign.  However they  persist, may want to consider cardiac MRI.      Relevant Medications   metoprolol tartrate (LOPRESSOR) 25 MG tablet   Other Relevant Orders   Comprehensive metabolic panel (Completed)   PVC's (premature ventricular contractions) (Chronic)   Relevant Medications   metoprolol tartrate (LOPRESSOR) 25 MG tablet   Rapid heart rate    Interestingly, she did not have PAT, but did have frequent PVCs and bigeminy which could explain her palpitations.  Do not think she felt that nonsustained V. tach.  Has not had similar symptoms since starting beta-blocker.  Hopefully by reducing morning dose we can increase energy level but still avoid the nighttime palpitations.         Current medicines are reviewed at length with the patient today. (+/- concerns) none The following changes have been made: add Metoprolol 25 mg BID  Patient Instructions  Medication Instructions:  DECREASE metoprolol to 12.5 mg (1/2 tablet) in the AM and continue 25 mg (1 tablet) in the evening.  Labwork: TODAY (CMET)  Follow-Up: Your physician wants you to follow-up in: 6 months with Dr. Ellyn Hack. You will receive a reminder letter in the mail two months in advance. If you don't receive a letter, please call our office to schedule the follow-up appointment.   Any Other Special Instructions Will Be Listed Below (If Applicable).     If you need a refill on your cardiac medications before your next appointment, please call your pharmacy.     Studies Ordered:   Orders Placed This Encounter  Procedures  . Comprehensive metabolic panel      Glenetta Hew, M.D., M.S. Interventional Cardiologist   Pager # [phone] Phone # [phone] [address]  [place], [place] [address]

## 2017-01-04 LAB — COMPREHENSIVE METABOLIC PANEL
ALBUMIN: 4.3 g/dL (ref 3.6–4.8)
ALT: 28 IU/L (ref 0–32)
AST: 21 IU/L (ref 0–40)
Albumin/Globulin Ratio: 1.5 (ref 1.2–2.2)
Alkaline Phosphatase: 96 IU/L (ref 39–117)
BUN/Creatinine Ratio: 16 (ref 12–28)
BUN: 12 mg/dL (ref 8–27)
Bilirubin Total: 0.8 mg/dL (ref 0.0–1.2)
CALCIUM: 9.2 mg/dL (ref 8.7–10.3)
CO2: 22 mmol/L (ref 20–29)
CREATININE: 0.77 mg/dL (ref 0.57–1.00)
Chloride: 104 mmol/L (ref 96–106)
GFR calc Af Amer: 93 mL/min/{1.73_m2} (ref >59)
GFR, EST NON AFRICAN AMERICAN: 81 mL/min/{1.73_m2} (ref >59)
GLOBULIN, TOTAL: 2.8 g/dL (ref 1.5–4.5)
Glucose: 101 mg/dL — ABNORMAL HIGH (ref 65–99)
Potassium: 4.8 mmol/L (ref 3.5–5.2)
SODIUM: 142 mmol/L (ref 134–144)
Total Protein: 7.1 g/dL (ref 6.0–8.5)

## 2017-01-05 NOTE — Assessment & Plan Note (Signed)
Truly euvolemic on exam.  Blood pressure relatively well controlled making grade 2 diastolic dysfunction interesting concept.  Perhaps her severe concentric LVH is related to a cardiomyopathy.  May need to consider cardiac MRI. I do not think the extent of mitral regurgitation correlates with dyspnea. Plan: At this point her blood pressure seems to be well controlled on minimal dose of beta-blocker.  As we wean down the beta-blocker, may consider adding afterload reduction.

## 2017-01-05 NOTE — Assessment & Plan Note (Signed)
Interestingly, she did not have PAT, but did have frequent PVCs and bigeminy which could explain her palpitations.  Do not think she felt that nonsustained V. tach.  Has not had similar symptoms since starting beta-blocker.  Hopefully by reducing morning dose we can increase energy level but still avoid the nighttime palpitations.

## 2017-01-05 NOTE — Assessment & Plan Note (Signed)
Very difficult to determine what her true dyspnea is related to.  She has relative normal EF on echo and negative Myoview for as far as any ischemia.  She does not have COPD as mentioned last week, but does have OSA and potentially OSH. She does have grade 2 diastolic dysfunction, but is relatively euvolemic. Plan: Discussed importance of weight loss. Reducing morning dose of beta-blocker to allow for better energy level. Continue to recommend exercise.

## 2017-01-05 NOTE — Assessment & Plan Note (Addendum)
No symptoms.  Noted on monitor, but nonischemic Myoview and normal EF on echo. She did not seem to tolerate 25 twice daily metoprolol so we will reduce the morning dose of 12.5 mg.  -Reorder chemistry panelbe done prior to this visit.  Need to evaluate potassium and magnesium levels.  Unlikely to be ischemic in nature.  Continue to monitor.  As long as they are not prolonged and she is not systematic, likely benign.  However they persist, may want to consider cardiac MRI.

## 2017-01-05 NOTE — Assessment & Plan Note (Signed)
Truly only mild to moderate mitral regurgitation noted on last echo. Plan will be to follow-up probably in 2 years unless murmur worsens.

## 2017-03-07 DIAGNOSIS — J069 Acute upper respiratory infection, unspecified: Secondary | ICD-10-CM | POA: Diagnosis not present

## 2017-03-22 DIAGNOSIS — H1045 Other chronic allergic conjunctivitis: Secondary | ICD-10-CM | POA: Diagnosis not present

## 2017-03-22 DIAGNOSIS — H04123 Dry eye syndrome of bilateral lacrimal glands: Secondary | ICD-10-CM | POA: Diagnosis not present

## 2017-03-22 DIAGNOSIS — H04129 Dry eye syndrome of unspecified lacrimal gland: Secondary | ICD-10-CM | POA: Diagnosis not present

## 2017-04-08 DIAGNOSIS — R42 Dizziness and giddiness: Secondary | ICD-10-CM | POA: Diagnosis not present

## 2017-04-08 DIAGNOSIS — S90851A Superficial foreign body, right foot, initial encounter: Secondary | ICD-10-CM | POA: Diagnosis not present

## 2017-04-08 DIAGNOSIS — R0981 Nasal congestion: Secondary | ICD-10-CM | POA: Diagnosis not present

## 2017-05-25 DIAGNOSIS — R309 Painful micturition, unspecified: Secondary | ICD-10-CM | POA: Diagnosis not present

## 2017-05-25 DIAGNOSIS — Z1231 Encounter for screening mammogram for malignant neoplasm of breast: Secondary | ICD-10-CM | POA: Diagnosis not present

## 2017-05-25 DIAGNOSIS — N901 Moderate vulvar dysplasia: Secondary | ICD-10-CM | POA: Diagnosis not present

## 2017-05-25 DIAGNOSIS — Z779 Other contact with and (suspected) exposures hazardous to health: Secondary | ICD-10-CM | POA: Diagnosis not present

## 2017-05-25 DIAGNOSIS — Z6838 Body mass index (BMI) 38.0-38.9, adult: Secondary | ICD-10-CM | POA: Diagnosis not present

## 2017-05-25 DIAGNOSIS — Z124 Encounter for screening for malignant neoplasm of cervix: Secondary | ICD-10-CM | POA: Diagnosis not present

## 2017-06-01 ENCOUNTER — Encounter: Payer: Self-pay | Admitting: Podiatry

## 2017-06-01 ENCOUNTER — Ambulatory Visit (INDEPENDENT_AMBULATORY_CARE_PROVIDER_SITE_OTHER): Payer: Medicare Other | Admitting: Podiatry

## 2017-06-01 ENCOUNTER — Other Ambulatory Visit: Payer: Self-pay | Admitting: Podiatry

## 2017-06-01 ENCOUNTER — Ambulatory Visit (INDEPENDENT_AMBULATORY_CARE_PROVIDER_SITE_OTHER): Payer: Medicare Other

## 2017-06-01 VITALS — BP 111/58 | HR 76 | Resp 16

## 2017-06-01 DIAGNOSIS — L923 Foreign body granuloma of the skin and subcutaneous tissue: Secondary | ICD-10-CM

## 2017-06-01 DIAGNOSIS — S99921A Unspecified injury of right foot, initial encounter: Secondary | ICD-10-CM

## 2017-06-01 DIAGNOSIS — M779 Enthesopathy, unspecified: Secondary | ICD-10-CM | POA: Diagnosis not present

## 2017-06-01 MED ORDER — TRIAMCINOLONE ACETONIDE 10 MG/ML IJ SUSP
10.0000 mg | Freq: Once | INTRAMUSCULAR | Status: AC
  Administered 2017-06-01: 10 mg

## 2017-06-01 NOTE — Progress Notes (Signed)
Subjective:   Patient ID: Rebecca Carr, female   DOB: 67 y.o.   MRN: [id number]   HPI Patient presents stating she got a piece of glass out of her foot a month ago but it still bothers her she thinks it could be another piece and she is having pain in her right ankle for the last few years and patient states that she does not smoke anymore and likes to be active   Review of Systems  All other systems reviewed and are negative.       Objective:  Physical Exam  Constitutional: She appears well-developed and well-nourished.  Cardiovascular: Intact distal pulses.  Pulmonary/Chest: Effort normal.  Musculoskeletal: Normal range of motion.  Neurological: She is alert.  Skin: Skin is warm.  Nursing note and vitals reviewed.   Neurovascular status intact muscle strength adequate range of motion within normal limits with patient found to have a small foreign body plantar aspect right localized in nature with no active drainage noted and exquisite discomfort in the right sinus tarsi.  Found to have good digital perfusion and well oriented x3     Assessment:  Foreign body right forefoot localized in nature with exquisite discomfort in the sinus tarsi right with inflammation     Plan:  H&P conditions reviewed and today I debrided the lesion sharply and I did not note any active drainage or other pathology and I injected the sinus tarsi right 3 mg Kenalog 5 mg Xylocaine.  Patient will be seen back to recheck  X-ray indicates that everything is healing well with no indications of pathology or arthritis

## 2017-06-17 DIAGNOSIS — G4733 Obstructive sleep apnea (adult) (pediatric): Secondary | ICD-10-CM | POA: Diagnosis not present

## 2017-06-17 DIAGNOSIS — H8110 Benign paroxysmal vertigo, unspecified ear: Secondary | ICD-10-CM | POA: Diagnosis not present

## 2017-06-17 DIAGNOSIS — J302 Other seasonal allergic rhinitis: Secondary | ICD-10-CM | POA: Diagnosis not present

## 2017-06-17 DIAGNOSIS — F458 Other somatoform disorders: Secondary | ICD-10-CM | POA: Diagnosis not present

## 2017-06-17 DIAGNOSIS — R42 Dizziness and giddiness: Secondary | ICD-10-CM | POA: Diagnosis not present

## 2017-07-05 ENCOUNTER — Ambulatory Visit: Payer: Medicare Other | Admitting: Cardiology

## 2017-07-05 DIAGNOSIS — L84 Corns and callosities: Secondary | ICD-10-CM | POA: Diagnosis not present

## 2017-07-05 DIAGNOSIS — R5383 Other fatigue: Secondary | ICD-10-CM | POA: Diagnosis not present

## 2017-07-05 DIAGNOSIS — E039 Hypothyroidism, unspecified: Secondary | ICD-10-CM | POA: Diagnosis not present

## 2017-07-05 DIAGNOSIS — R234 Changes in skin texture: Secondary | ICD-10-CM | POA: Diagnosis not present

## 2017-07-05 DIAGNOSIS — F411 Generalized anxiety disorder: Secondary | ICD-10-CM | POA: Diagnosis not present

## 2017-07-05 DIAGNOSIS — R58 Hemorrhage, not elsewhere classified: Secondary | ICD-10-CM | POA: Diagnosis not present

## 2017-07-07 DIAGNOSIS — R42 Dizziness and giddiness: Secondary | ICD-10-CM | POA: Diagnosis not present

## 2017-07-08 ENCOUNTER — Ambulatory Visit (INDEPENDENT_AMBULATORY_CARE_PROVIDER_SITE_OTHER): Payer: Medicare Other | Admitting: Gastroenterology

## 2017-07-08 ENCOUNTER — Encounter: Payer: Self-pay | Admitting: Gastroenterology

## 2017-07-08 VITALS — BP 130/74 | HR 80 | Ht 67.5 in | Wt 249.4 lb

## 2017-07-08 DIAGNOSIS — Z800 Family history of malignant neoplasm of digestive organs: Secondary | ICD-10-CM | POA: Diagnosis not present

## 2017-07-08 DIAGNOSIS — K2100 Gastro-esophageal reflux disease with esophagitis, without bleeding: Secondary | ICD-10-CM

## 2017-07-08 DIAGNOSIS — R1310 Dysphagia, unspecified: Secondary | ICD-10-CM

## 2017-07-08 DIAGNOSIS — R1012 Left upper quadrant pain: Secondary | ICD-10-CM

## 2017-07-08 DIAGNOSIS — Z86010 Personal history of colonic polyps: Secondary | ICD-10-CM

## 2017-07-08 MED ORDER — NA SULFATE-K SULFATE-MG SULF 17.5-3.13-1.6 GM/177ML PO SOLN: 1.0000 | Freq: Once | ORAL | 0 refills | Status: AC

## 2017-07-08 MED ORDER — OMEPRAZOLE 40 MG PO CPDR: 40.0000 mg | DELAYED_RELEASE_CAPSULE | Freq: Two times a day (BID) | ORAL | 11 refills | Status: DC

## 2017-07-08 NOTE — Progress Notes (Signed)
History of Present Illness: This is a 67 year old here for the evaluation of hoarseness, vocal changes and dysphagia.  She relates problems for the past 2 years with recurrent hoarseness and often losing her voice.  She has not sought ENT evaluation for this problem although she did recently see Dr. Wilburn Cornelia for evaluation of dizziness.  She relates a fullness sensation in her throat and difficulty swallowing solids and liquids intermittently. She localizes these symptoms to the base of her neck.  She has been maintained on omeprazole 40 mg daily and rarely has heartburn.  She occasionally has a brief intense crampy pain in her left upper quadrant when seated and lifting her leg.  She often notes this happens in the bathtub. The pain resolves within a few minutes.  This pain is not associated with meals or bowel movements.  She has a father with colon cancer and a nephew was diagnosed with colon cancer at age 75. Denies weight loss,  constipation, diarrhea, change in stool caliber, melena, hematochezia, nausea, vomiting, chest pain.   Colonoscopy 09/2013 1. Moderate diverticulosis was noted in the descending colon and sigmoid colon 2. Sessile polyp measuring 4 mm in the sigmoid colon; polypectomy performed with cold forceps Tubular adenoma  EGD 09/2013 1. LA Class B esophagitis 2. Stricture at the gastroesophageal junction 3. Gastritis in the gastric antrum, gastric body, and gastric fundus; multiple biopsies 4. Duodenitis in the bulb and second portion of the duodenum 5. Small hiatal Chronic inactive gastritis    Allergies  Allergen Reactions  . Iodine Anaphylaxis  . Iohexol Anaphylaxis     Desc: Patient states she is allergic to iodine and had a "code blue" incident after an injection for some type of imaging study.   . Penicillins Rash    PCN IN LARGE DOSES.   Outpatient Medications Prior to Visit  Medication Sig Dispense Refill  . aspirin EC 81 MG tablet Take 81 mg by mouth  daily. Reported on 07/16/2015    . atorvastatin (LIPITOR) 40 MG tablet Take 20 mg by mouth every evening.     Marland Kitchen azelastine (ASTELIN) 0.1 % nasal spray Place 2 sprays into both nostrils daily.  0  . busPIRone (BUSPAR) 15 MG tablet Take 15 mg by mouth 3 (three) times daily as needed.     . fluticasone (FLONASE) 50 MCG/ACT nasal spray Place 2 sprays into both nostrils every morning.    Marland Kitchen levothyroxine (SYNTHROID, LEVOTHROID) 137 MCG tablet Take 137 mcg by mouth daily before breakfast.    . metoprolol tartrate (LOPRESSOR) 25 MG tablet Take 0.5 tablets (12.5 mg total) by mouth every morning AND 1 tablet (25 mg total) every evening. 180 tablet 3  . nicotine polacrilex (NICORETTE) 2 MG gum Take 2 mg by mouth as needed for smoking cessation.    Marland Kitchen omeprazole (PRILOSEC) 40 MG capsule TAKE ONE CAPSULE BY MOUTH DAILY BEFORE BREAKFAST (Patient taking differently: TAKE ONE CAPSULE BY MOUTH DAILY BEFORE BREAKFAst---  pt takes at noon) 90 capsule 0  . ALBUTEROL IN Inhale into the lungs as needed.    . nicotine polacrilex (NICORETTE) 4 MG gum Take 2 mg by mouth as needed for smoking cessation.      No facility-administered medications prior to visit.    Past Medical History:  Diagnosis Date  . Anxiety   . Arthritis   . DES exposure in utero   . Diverticulosis of colon   . Dizziness   . Environmental and seasonal allergies   .  GERD (gastroesophageal reflux disease)   . Hashimoto's thyroiditis   . Heart palpitations   . Hiatal hernia   . History of adenomatous polyp of colon    hyperplastic 2015  and tubular adenoma 2005  . History of esophageal dilatation    of stricture  . History of esophagitis    LA class , grade B and Gastritis  . History of pituitary tumor    dx 1990's - asymptomatic /  per pt last scan 2005 approx.  resolved   . Hyperlipidemia   . Hypothyroidism   . Leg pain, bilateral   . Mild intermittent asthma   . Moderate mitral regurgitation by prior echocardiography 2012   1-2 MR on  Echo  . MTHFR mutation ([place])   . MVP (mitral valve prolapse)    per pt dx 1980 approx.  -- asymptomatic; Not seen on 2012 Echo  . OSA (obstructive sleep apnea)    non-compliant cpap  . Prediabetes   . SUI (stress urinary incontinence, female)   . VIN III (vulvar intraepithelial neoplasia III)   . Wears glasses    Past Surgical History:  Procedure Laterality Date  . CESAREAN SECTION  1994  . CO2 LASER APPLICATION N/A 9/56/2130   Procedure: CO2 LASER APPLICATION ;  Surgeon: Everitt Amber, MD;  Location: [facility];  Service: Gynecology;  Laterality: N/A;  . COLONOSCOPY, ESOPHAGOGASTRODUODENOSCOPY (EGD) AND ESOPHAGEAL DILATION  10-04-2013  . LAPAROSCOPIC CHOLECYSTECTOMY  1995  . LASER ABLATION CONDOLAMATA N/A 07/03/2015   Procedure: CO2 LASER OF VULVA ;  Surgeon: Everitt Amber, MD;  Location: [facility];  Service: Gynecology;  Laterality: N/A;  . TRANSTHORACIC ECHOCARDIOGRAM  11/2010   Normal LV Size & Fxn - EF 55-60%. Mild-Mod MR. Normal DF.   Social History   Socioeconomic History  . Marital status: Married    Spouse name: Jenny Reichmann  . Number of children: 1  . Years of education: Not on file  . Highest education level: Not on file  Occupational History  . Occupation: CUSTOMER SERVICE    Employer: [employer]    Comment: Is about to retire  Social Needs  . Financial resource strain: Not on file  . Food insecurity:    Worry: Not on file    Inability: Not on file  . Transportation needs:    Medical: Not on file    Non-medical: Not on file  Tobacco Use  . Smoking status: Former Smoker    Packs/day: 1.00    Years: 48.00    Pack years: 48.00    Types: Cigarettes    Last attempt to quit: 05/10/2014    Years since quitting: 3.1  . Smokeless tobacco: Never Used  . Tobacco comment: using nicorrette  Substance and Sexual Activity  . Alcohol use: Yes    Alcohol/week: 1.2 oz    Types: 2 Standard drinks or equivalent per week    Comment: Social - > is now  essentially quit  . Drug use: No  . Sexual activity: Not on file  Lifestyle  . Physical activity:    Days per week: Not on file    Minutes per session: Not on file  . Stress: Not on file  Relationships  . Social connections:    Talks on phone: Not on file    Gets together: Not on file    Attends religious service: Not on file    Active member of club or organization: Not on file    Attends meetings  of clubs or organizations: Not on file    Relationship status: Not on file  Other Topics Concern  . Not on file  Social History Narrative   No routine exercise. Describes herself as "lazy "   Family History  Problem Relation Age of Onset  . Pancreatic cancer Father   . Colon cancer Father   . Heart attack Mother   . Heart disease Mother   . Heart attack Brother   . COPD Brother   . Dementia Brother   . Colon cancer Maternal Grandfather       Review of Systems: Pertinent positive and negative review of systems were noted in the above HPI section. All other review of systems were otherwise negative.   Physical Exam: General: Well developed, well nourished, no acute distress Head: Normocephalic and atraumatic Eyes:  sclerae anicteric, EOMI Ears: Normal auditory acuity Mouth: No deformity or lesions Neck: Supple, no masses or thyromegaly Lungs: Clear throughout to auscultation Heart: Regular rate and rhythm; no murmurs, rubs or bruits Abdomen: Soft, non tender and non distended. No masses, hepatosplenomegaly or hernias noted. Normal Bowel sounds Rectal: Deferred to colonoscopy Musculoskeletal: Symmetrical with no gross deformities  Skin: No lesions on visible extremities Pulses:  Normal pulses noted Extremities: No clubbing, cyanosis, edema or deformities noted Neurological: Alert oriented x 4, grossly nonfocal Cervical Nodes:  No significant cervical adenopathy Inguinal Nodes: No significant inguinal adenopathy Psychological:  Alert and cooperative. Normal mood and  affect  Assessment and Recommendations:  1. GERD with history of LA Class B esophagitis, h/o an esophageal stricture, hoarseness, vocal changes, globus sensation and dysphagia with liquids and solids.  All symptoms could be related to GERD with LPR and an esophageal stricture.  If hoarseness with vocal changes and globus sensation do not improve over the next several weeks with intensifying antireflux treatment she is advised to undergo ENT evaluation.  She states she does not want to return to Dr. Wilburn Cornelia and would like to see another ENT physician.  Increase omeprazole to 40 mg twice daily taken 30 minutes before breakfast and dinner.  Intensify antireflux measures.  If her symptoms are not improving over the next several weeks she is advised to call the office and we will consider adding ranitidine 300 mg at bedtime and/or changing to a different PPI. Schedule EGD with possible dilation. The risks (including bleeding, perforation, infection, missed lesions, medication reactions and possible hospitalization or surgery if complications occur), benefits, and alternatives to endoscopy with possible biopsy and possible dilation were discussed with the patient and they consent to proceed.   2.  Personal history of adenomatous colon polyps. Strong family history of colon cancer, nephew at age 42 and father. Schedule surveillance colonoscopy. The risks (including bleeding, perforation, infection, missed lesions, medication reactions and possible hospitalization or surgery if complications occur), benefits, and alternatives to colonoscopy with possible biopsy and possible polypectomy were discussed with the patient and they consent to proceed.   3. Intermittent LUQ pain, very likely an abdominal wall muscle spasm.

## 2017-07-08 NOTE — Patient Instructions (Addendum)
Increase your omeprazole to 40 mg twice daily. A new prescription has been sent to your pharmacy.   You have been scheduled for an endoscopy and colonoscopy. Please follow the written instructions given to you at your visit today. Please pick up your prep supplies at the pharmacy within the next 1-3 days. If you use inhalers (even only as needed), please bring them with you on the day of your procedure. Your physician has requested that you go to www.startemmi.com and enter the access code given to you at your visit today. This web site gives a general overview about your procedure. However, you should still follow specific instructions given to you by our office regarding your preparation for the procedure.  If you do not see any improvement in your symptoms in the next few weeks please contact our office.   Normal BMI (Body Mass Index- based on height and weight) is between 23 and 30. Your BMI today is Body mass index is 38.48 kg/m. Marland Kitchen Please consider follow up  regarding your BMI with your Primary Care Provider.  Thank you for choosing me and [facility].  Pricilla Riffle. Dagoberto Ligas., MD., [facility]val Regal

## 2017-07-11 ENCOUNTER — Telehealth: Payer: Self-pay | Admitting: Gastroenterology

## 2017-07-11 NOTE — Telephone Encounter (Signed)
Patient states prep for procedure on 7.30.19 is too expensive at around $70 and wants to know if there is something else sooner. Pt had ov on 5.31.19.

## 2017-07-12 DIAGNOSIS — E039 Hypothyroidism, unspecified: Secondary | ICD-10-CM | POA: Diagnosis not present

## 2017-07-12 NOTE — Telephone Encounter (Signed)
Left a message for patient to return my call. 

## 2017-07-12 NOTE — Telephone Encounter (Signed)
Informed patient we do get free Suprep samples here in the office sometimes mailed to [place] and to I can call her when we get some in the mail. Patient verbalized understanding.

## 2017-07-14 NOTE — Telephone Encounter (Signed)
Informed patient we got a Suprep kit in now and to come by our office to pick it up. Patient states she will come by to get the kit.

## 2017-08-10 ENCOUNTER — Encounter: Payer: Self-pay | Admitting: Cardiology

## 2017-08-10 ENCOUNTER — Ambulatory Visit (INDEPENDENT_AMBULATORY_CARE_PROVIDER_SITE_OTHER): Payer: Medicare Other | Admitting: Cardiology

## 2017-08-10 ENCOUNTER — Ambulatory Visit: Payer: Medicare Other | Admitting: Cardiology

## 2017-08-10 VITALS — BP 133/88 | HR 74 | Ht 68.0 in | Wt 250.8 lb

## 2017-08-10 DIAGNOSIS — I340 Nonrheumatic mitral (valve) insufficiency: Secondary | ICD-10-CM | POA: Diagnosis not present

## 2017-08-10 DIAGNOSIS — I5189 Other ill-defined heart diseases: Secondary | ICD-10-CM

## 2017-08-10 DIAGNOSIS — R252 Cramp and spasm: Secondary | ICD-10-CM | POA: Diagnosis not present

## 2017-08-10 DIAGNOSIS — R0600 Dyspnea, unspecified: Secondary | ICD-10-CM

## 2017-08-10 DIAGNOSIS — R002 Palpitations: Secondary | ICD-10-CM | POA: Diagnosis not present

## 2017-08-10 DIAGNOSIS — R0609 Other forms of dyspnea: Secondary | ICD-10-CM

## 2017-08-10 DIAGNOSIS — I4729 Other ventricular tachycardia: Secondary | ICD-10-CM

## 2017-08-10 DIAGNOSIS — I493 Ventricular premature depolarization: Secondary | ICD-10-CM | POA: Diagnosis not present

## 2017-08-10 DIAGNOSIS — I472 Ventricular tachycardia: Secondary | ICD-10-CM | POA: Diagnosis not present

## 2017-08-10 NOTE — Patient Instructions (Signed)
NO MEDICATION CHANGES    SCHEDULE AT [place] has requested that you have an ankle brachial index (ABI). During this test an ultrasound and blood pressure cuff are used to evaluate the arteries that supply the arms and legs with blood. Allow thirty minutes for this exam. There are no restrictions or special instructions.IF ABNORMAL ,WILL SCHEDULE Your physician has requested that you have a lower  extremity arterial duplex. This test is an ultrasound of the arteries in the legs. It looks at arterial blood flow in the legs. Allow one hour for Lower Arterial scans. There are no restrictions or special instructions   \ Your physician wants you to follow-up in [place], You will receive a reminder letter in the mail two months in advance. If you don't receive a letter, please call our office to schedule the follow-up appointment.

## 2017-08-10 NOTE — Progress Notes (Signed)
PCP: Medicine, [name], Dr. Juleen China  Clinic Note: Chief Complaint  Patient presents with  . Follow-up    Energy a bit better.  Palpitations a bit better.   HPI:Rebecca Carr is a 67 y.o. female who is being seen today for 6 month f/u --initially seen for evaluation of palpitations and heart murmur EKG at the request of Starlyn Skeans, PA-C. She was initially seen in July 2018 and evaluated with an echocardiogram and event monitor she had.  2 short episodes of NSVT and was therefore evaluated with a Myoview.  She was seen in August to follow-up the results of her Myoview, and still noted palpitations but they did improve.  No chest discomfort or dyspnea.  She had reduced her caffeine and chocolate intake.  [name] was last seen on  January 03, 2017 --> noticed fatigue however improved palpitations.  Some exertional dyspnea going up steps or uphill but not with routine walking.    Also noted having OSA.  Recent Hospitalizations: none  Studies Personally Reviewed - (if available, images/films reviewed: From Epic Chart or Care Everywhere)  None  Interval History: Mrs. Easily returns today noting that she is in quite a bit of pain  mostly back pain but otherwise also joint pain.  She is due to be seeing a rheumatologist because of a mildly positive ANA panel.  There is concern for possible rheumatologic arthritis. She still notes that her energy is bad.  She has not has been started on Synthroid and now also started on Cytomel and is hoping that that will help.  She has noted some benefit already from Cytomel.  She is on a very low dose of beta-blocker and does note that her palpitations are better.  She still has them, but no prolonged episodes --> usually, her symptoms are abated with taking deep breaths and doing vagal maneuvers. She does have exertional dyspnea, but denies any chest tightness pressure with rest or exertion.  No PND, orthopnea or  edema. She does have some restless leg syndrome issues and has pretty frequent leg cramps and spasms.  Despite having some palpitations, no rapid irregular heartbeats.  No syncope/near syncope or TIA symptoms fugax.  No claudication.  ROS: A comprehensive was performed. Review of Systems  Constitutional: Positive for malaise/fatigue (Somewhat improved with Cytomel.Marland Kitchen).  HENT: Negative for nosebleeds.   Respiratory: Positive for shortness of breath. Negative for cough and wheezing.   Cardiovascular: Palpitations: much less        Per HPI  Gastrointestinal: Positive for abdominal pain (cramping in the upper abdomen). Negative for blood in stool and melena.  Genitourinary: Negative for dysuria and hematuria.  Musculoskeletal: Positive for back pain, joint pain and myalgias (6 more frequent cramps.  Cramps run down both legs and into the toes.). Negative for falls.  Neurological: Negative for dizziness (much improved & not associated wiht palpitation. ).  Psychiatric/Behavioral: Positive for depression. Negative for memory loss. The patient is nervous/anxious. The patient does not have insomnia.   All other systems reviewed and are negative.  I have reviewed and (if needed) personally updated the patient's problem list, medications, allergies, past medical and surgical history, social and family history.   Past Medical History:  Diagnosis Date  . Anxiety   . Arthritis   . DES exposure in utero   . Diverticulosis of colon   . Dizziness   . Environmental and seasonal allergies   . GERD (gastroesophageal reflux disease)   .  Hashimoto's thyroiditis   . Heart palpitations   . Hiatal hernia   . History of adenomatous polyp of colon    hyperplastic 2015  and tubular adenoma 2005  . History of esophageal dilatation    of stricture  . History of esophagitis    LA class , grade B and Gastritis  . History of pituitary tumor    dx 1990's - asymptomatic /  per pt last scan 2005 approx.   resolved   . Hyperlipidemia   . Hypothyroidism   . Leg pain, bilateral   . Mild intermittent asthma   . Moderate mitral regurgitation by prior echocardiography 2012   1-2 MR on Echo  . MTHFR mutation ([place])   . MVP (mitral valve prolapse)    Mild calcified posterior leaflet prolapse on echo July 2018.  Mild MR.  . OSA (obstructive sleep apnea)    non-compliant cpap  . Prediabetes   . SUI (stress urinary incontinence, female)   . VIN III (vulvar intraepithelial neoplasia III)   . Wears glasses     Past Surgical History:  Procedure Laterality Date  . CARDIAC EVENT MONITOR  July-August 2018   Normal sinus rhythm with sinus tachycardia.  Average heart rate 91 bpm.  Occasional PVCs.  One 12 and another 6 beat run of wide-complex tachycardia.  2 morphology PVCs.PVCs noted, but not during longer episode of WCT.  Marland Kitchen CESAREAN SECTION  1994  . CO2 LASER APPLICATION N/A 1/74/0814   Procedure: CO2 LASER APPLICATION ;  Surgeon: Everitt Amber, MD;  [facility];  Service: Gynecology;  Laterality: N/A;  . COLONOSCOPY, ESOPHAGOGASTRODUODENOSCOPY (EGD) AND ESOPHAGEAL DILATION  10-04-2013  . LAPAROSCOPIC CHOLECYSTECTOMY  1995  . LASER ABLATION CONDOLAMATA N/A 07/03/2015   Procedure: CO2 LASER OF VULVA ;  Surgeon: Everitt Amber, MD;  Location: [facility];  Service: Gynecology;  Laterality: N/A;  . NM MYOVIEW LTD  10/2016   LOW RISK.  No ischemia or infarction.  EF 54%.  . TRANSTHORACIC ECHOCARDIOGRAM  11/2010   Normal LV Size & Fxn - EF 55-60%. Mild-Mod MR. Normal DF.  Marland Kitchen TRANSTHORACIC ECHOCARDIOGRAM  08/2016   EF 60 of 65%.  Severe LVH.  Pseudo-normal filling (grade 2 diastolic dysfunction).  MAC with calcified mobile attachments.  Posterior leaflet prolapse.  Mild regurgitation.  Moderate LA dilation.    Current Meds  Medication Sig  . ALBUTEROL IN Inhale into the lungs as needed.  Marland Kitchen aspirin EC 81 MG tablet Take 81 mg by mouth daily. Reported on 07/16/2015  .  atorvastatin (LIPITOR) 40 MG tablet Take 20 mg by mouth every evening.   Marland Kitchen azelastine (ASTELIN) 0.1 % nasal spray Place 2 sprays into both nostrils daily.  . busPIRone (BUSPAR) 15 MG tablet Take 15 mg by mouth 3 (three) times daily as needed.   . fluticasone (FLONASE) 50 MCG/ACT nasal spray Place 2 sprays into both nostrils every morning.  Marland Kitchen levothyroxine (SYNTHROID, LEVOTHROID) 137 MCG tablet Take 137 mcg by mouth daily before breakfast.  . liothyronine (CYTOMEL) 5 MCG tablet Take 5 mcg by mouth daily.  . metoprolol tartrate (LOPRESSOR) 25 MG tablet Take 0.5 tablets (12.5 mg total) by mouth every morning AND 1 tablet (25 mg total) every evening.  . nicotine polacrilex (NICORETTE) 2 MG gum Take 2 mg by mouth as needed for smoking cessation.  Marland Kitchen omeprazole (PRILOSEC) 40 MG capsule Take 1 capsule (40 mg total) by mouth 2 (two) times daily.    Allergies  Allergen  Reactions  . Iodine Anaphylaxis  . Iohexol Anaphylaxis     Desc: Patient states she is allergic to iodine and had a "code blue" incident after an injection for some type of imaging study.   . Penicillins Rash    PCN IN LARGE DOSES.    Social History   Tobacco Use  . Smoking status: Former Smoker    Packs/day: 1.00    Years: 48.00    Pack years: 48.00    Types: Cigarettes    Last attempt to quit: 05/10/2014    Years since quitting: 3.2  . Smokeless tobacco: Never Used  . Tobacco comment: using nicorrette  Substance Use Topics  . Alcohol use: Yes    Alcohol/week: 1.2 oz    Types: 2 Standard drinks or equivalent per week    Comment: Social - > is now essentially quit  . Drug use: No   Social History   Social History Narrative   No routine exercise. Describes herself as "lazy "     family history includes COPD in her brother; Colon cancer in her father and maternal grandfather; Dementia in her brother; Heart attack in her brother and mother; Heart disease in her mother; Pancreatic cancer in her father.  Wt Readings  from Last 3 Encounters:  08/10/17 250 lb 12.8 oz (113.8 kg)  07/08/17 249 lb 6 oz (113.1 kg)  01/03/17 254 lb (115.2 kg)    PHYSICAL EXAM BP 133/88   Pulse 74   Ht 5' 8"  (1.727 m)   Wt 250 lb 12.8 oz (113.8 kg)   BMI 38.13 kg/m  Physical Exam  Constitutional: She is oriented to person, place, and time. She appears well-developed and well-nourished. No distress.  Moderate-morbidly obese  HENT:  Head: Normocephalic and atraumatic.  Eyes: EOM are normal.  Neck: Neck supple. No hepatojugular reflux and no JVD present. Carotid bruit is not present.  Cardiovascular: Normal rate, regular rhythm and normal pulses. Exam reveals no gallop and no friction rub.  Murmur heard. High-pitched harsh crescendo-decrescendo midsystolic murmur is present with a grade of 1/6 at the upper right sternal border.  Blowing plateau holosystolic murmur of grade 2/6 is also present. Unable to palpate PMI  Pulmonary/Chest: Effort normal and breath sounds normal. No respiratory distress. She has no rales.  Distant breath sounds  Abdominal: Soft. Bowel sounds are normal. She exhibits no distension. There is no tenderness. There is no rebound.  Obese  Musculoskeletal: Normal range of motion. She exhibits edema (trivial).  Neurological: She is alert and oriented to person, place, and time.  Psychiatric: She has a normal mood and affect. Her behavior is normal. Thought content normal.  Nursing note and vitals reviewed.  Adult ECG Report n/a  Other studies Reviewed: Additional studies/ records that were reviewed today include:  Recent Labs:  No results found for: CHOL, HDL, LDLCALC, LDLDIRECT, TRIG, CHOLHDL Lab Results  Component Value Date   CREATININE 0.77 01/03/2017   BUN 12 01/03/2017   NA 142 01/03/2017   K 4.8 01/03/2017   CL 104 01/03/2017   CO2 22 01/03/2017   ASSESSMENT / PLAN: Problem List Items Addressed This Visit    PVC's (premature ventricular contractions) - Primary (Chronic)     Frequent PVCs noted on monitor with short nonsustained V. tach.  Negative Myoview.  Relatively normal EF with no regional wall motion normalities.  She does have severe LVH. Only able to tolerate very low-dose beta-blocker.   I am concerned about possibly titrating up because  of her fatigue.      Relevant Orders   EKG 12-Lead   Palpitations (Chronic)   Relevant Orders   EKG 12-Lead   NSVT (nonsustained ventricular tachycardia) (HCC) (Chronic)    Normal EF on echo with nonischemic Myoview.  Could be related to LVH.  Excellent only able to tolerate low-dose beta-blocker.  If significant PVCs symptoms persist, may need to consider cardiac MRI to assess for cardiomyopathy.      Relevant Orders   EKG 12-Lead   Moderate mitral regurgitation (Chronic)    Only mild regurgitation on last echo.  This is associate with mild posterior prolapse and calcification.  Likely rheumatic in nature. Plan will be to follow-up echo in fall of 2020.  Monitor for symptoms, but for now we will simply treat blood pressure. No need for prophylaxis.      Exertional dyspnea    Probably related to combined obesity, OSA with may be some mild obesity hypoventilation syndrome.  Also severe LVH with likely diastolic dysfunction. Otherwise does appear to be euvolemic.  No diuretic required. Continue low-dose beta-blocker.      Diastolic dysfunction without heart failure    Again euvolemic. May need to consider cardiac MRI.  If blood pressure tolerates, would consider adding ARB for afterload reduction, we are not likely we will titrate up beta-blocker due to fatigue.      Bilateral leg cramps (Chronic)    Suggested statin holiday.  This may potentially help.  If it does then we may need to adjust therapy.  We will check ABIs simply to exclude PAD      Relevant Orders   VAS [place] LOWER EXT ART SEG MULTI (SEGMENTALS & LE RAYNAUDS)   VAS [place] LOWER EXTREMITY ARTERIAL DUPLEX      Current medicines are reviewed  at length with the patient today. (+/- concerns) none The following changes have been made:None  Patient Instructions  NO MEDICATION CHANGES    SCHEDULE AT [place] has requested that you have an ankle brachial index (ABI). During this test an ultrasound and blood pressure cuff are used to evaluate the arteries that supply the arms and legs with blood. Allow thirty minutes for this exam. There are no restrictions or special instructions.IF ABNORMAL ,WILL SCHEDULE Your physician has requested that you have a lower  extremity arterial duplex. This test is an ultrasound of the arteries in the legs. It looks at arterial blood flow in the legs. Allow one hour for Lower Arterial scans. There are no restrictions or special instructions   \ Your physician wants you to follow-up in [place], You will receive a reminder letter in the mail two months in advance. If you don't receive a letter, please call our office to schedule the follow-up appointment.    Studies Ordered:   Orders Placed This Encounter  Procedures  . EKG 12-Lead      Glenetta Hew, M.D., M.S. Interventional Cardiologist   Pager # [phone] Phone # [phone] [address] [place], [place] [address]

## 2017-08-11 ENCOUNTER — Encounter: Payer: Self-pay | Admitting: Cardiology

## 2017-08-11 NOTE — Assessment & Plan Note (Signed)
Probably related to combined obesity, OSA with may be some mild obesity hypoventilation syndrome.  Also severe LVH with likely diastolic dysfunction. Otherwise does appear to be euvolemic.  No diuretic required. Continue low-dose beta-blocker.

## 2017-08-11 NOTE — Assessment & Plan Note (Signed)
Normal EF on echo with nonischemic Myoview.  Could be related to LVH.  Excellent only able to tolerate low-dose beta-blocker.  If significant PVCs symptoms persist, may need to consider cardiac MRI to assess for cardiomyopathy.

## 2017-08-11 NOTE — Assessment & Plan Note (Addendum)
Suggested statin holiday.  This may potentially help.  If it does then we may need to adjust therapy.  We will check ABIs simply to exclude PAD

## 2017-08-11 NOTE — Assessment & Plan Note (Signed)
Again euvolemic. May need to consider cardiac MRI.  If blood pressure tolerates, would consider adding ARB for afterload reduction, we are not likely we will titrate up beta-blocker due to fatigue.

## 2017-08-11 NOTE — Assessment & Plan Note (Signed)
Only mild regurgitation on last echo.  This is associate with mild posterior prolapse and calcification.  Likely rheumatic in nature. Plan will be to follow-up echo in fall of 2020.  Monitor for symptoms, but for now we will simply treat blood pressure. No need for prophylaxis.

## 2017-08-11 NOTE — Assessment & Plan Note (Signed)
Frequent PVCs noted on monitor with short nonsustained V. tach.  Negative Myoview.  Relatively normal EF with no regional wall motion normalities.  She does have severe LVH. Only able to tolerate very low-dose beta-blocker.   I am concerned about possibly titrating up because of her fatigue.

## 2017-08-16 DIAGNOSIS — E063 Autoimmune thyroiditis: Secondary | ICD-10-CM | POA: Diagnosis not present

## 2017-08-22 ENCOUNTER — Other Ambulatory Visit: Payer: Self-pay | Admitting: Cardiology

## 2017-08-22 DIAGNOSIS — I739 Peripheral vascular disease, unspecified: Secondary | ICD-10-CM

## 2017-08-23 DIAGNOSIS — G8929 Other chronic pain: Secondary | ICD-10-CM | POA: Diagnosis not present

## 2017-08-23 DIAGNOSIS — M4126 Other idiopathic scoliosis, lumbar region: Secondary | ICD-10-CM | POA: Diagnosis not present

## 2017-08-23 DIAGNOSIS — R5383 Other fatigue: Secondary | ICD-10-CM | POA: Diagnosis not present

## 2017-08-23 DIAGNOSIS — R768 Other specified abnormal immunological findings in serum: Secondary | ICD-10-CM | POA: Diagnosis not present

## 2017-08-23 DIAGNOSIS — M2550 Pain in unspecified joint: Secondary | ICD-10-CM | POA: Diagnosis not present

## 2017-08-25 ENCOUNTER — Ambulatory Visit (HOSPITAL_COMMUNITY)
Admission: RE | Admit: 2017-08-25 | Discharge: 2017-08-25 | Disposition: A | Discharge status: Home / Self Care | Payer: Medicare Other | Source: Ambulatory Visit | Attending: Cardiology | Admitting: Cardiology

## 2017-08-25 ENCOUNTER — Encounter (HOSPITAL_COMMUNITY): Payer: Self-pay

## 2017-08-25 DIAGNOSIS — R252 Cramp and spasm: Secondary | ICD-10-CM

## 2017-08-29 ENCOUNTER — Encounter: Payer: Self-pay | Admitting: Gastroenterology

## 2017-08-30 ENCOUNTER — Telehealth: Payer: Self-pay | Admitting: Gastroenterology

## 2017-08-30 DIAGNOSIS — R350 Frequency of micturition: Secondary | ICD-10-CM | POA: Diagnosis not present

## 2017-08-30 NOTE — Telephone Encounter (Signed)
Patient advised that ok to be on antibiotics for her UTI as long as she is not running fecer

## 2017-09-01 ENCOUNTER — Telehealth: Payer: Self-pay | Admitting: Cardiology

## 2017-09-01 NOTE — Telephone Encounter (Signed)
Follow Up:     Pt says she is retuning a call, concerning her text results. She says if she is not there ,please leave her a detailed message.

## 2017-09-01 NOTE — Telephone Encounter (Signed)
Pt aware LE doppler results .Rebecca Carr

## 2017-09-02 ENCOUNTER — Telehealth: Payer: Self-pay | Admitting: Gastroenterology

## 2017-09-02 NOTE — Telephone Encounter (Signed)
She is having UTI symptoms and vaginal yeast infection symptoms without confirmation by diagnostic testing.  She has no fever and is not on an antibiotic.  I encouraged her to see her doctor for testing but she wants to wait until after her procedure next Tuesday.  She will push fluids and void often.  Call PCP with increase in symptoms.

## 2017-09-06 ENCOUNTER — Encounter: Payer: Self-pay | Admitting: Gastroenterology

## 2017-09-06 ENCOUNTER — Ambulatory Visit (AMBULATORY_SURGERY_CENTER): Payer: Medicare Other | Admitting: Gastroenterology

## 2017-09-06 VITALS — BP 98/52 | HR 79 | Temp 98.6°F | Resp 17 | Ht 68.0 in | Wt 250.0 lb

## 2017-09-06 DIAGNOSIS — R1310 Dysphagia, unspecified: Secondary | ICD-10-CM

## 2017-09-06 DIAGNOSIS — Z800 Family history of malignant neoplasm of digestive organs: Secondary | ICD-10-CM | POA: Diagnosis not present

## 2017-09-06 DIAGNOSIS — K219 Gastro-esophageal reflux disease without esophagitis: Secondary | ICD-10-CM | POA: Diagnosis not present

## 2017-09-06 DIAGNOSIS — Z86010 Personal history of colonic polyps: Secondary | ICD-10-CM | POA: Diagnosis not present

## 2017-09-06 DIAGNOSIS — K635 Polyp of colon: Secondary | ICD-10-CM

## 2017-09-06 DIAGNOSIS — D123 Benign neoplasm of transverse colon: Secondary | ICD-10-CM | POA: Diagnosis not present

## 2017-09-06 DIAGNOSIS — K2950 Unspecified chronic gastritis without bleeding: Secondary | ICD-10-CM | POA: Diagnosis not present

## 2017-09-06 DIAGNOSIS — Z860101 Personal history of adenomatous and serrated colon polyps: Secondary | ICD-10-CM

## 2017-09-06 MED ORDER — SODIUM CHLORIDE 0.9 % IV SOLN: 500.0000 mL | Freq: Once | INTRAVENOUS | Status: DC

## 2017-09-06 NOTE — Op Note (Signed)
[place] Patient Name: Rebecca Carr Procedure Date: 09/06/2017 2:28 PM MRN: [id number] Endoscopist: Ladene Artist , MD Age: 67 Referring MD:  Date of Birth: 1950-04-17 Gender: Female Account #: [id number] Procedure:                Upper GI endoscopy Indications:              Dysphagia, Gastroesophageal reflux disease Medicines:                Monitored Anesthesia Care Procedure:                Pre-Anesthesia Assessment:                           - Prior to the procedure, a History and Physical                            was performed, and patient medications and                            allergies were reviewed. The patient's tolerance of                            previous anesthesia was also reviewed. The risks                            and benefits of the procedure and the sedation                            options and risks were discussed with the patient.                            All questions were answered, and informed consent                            was obtained. Prior Anticoagulants: The patient has                            taken no previous anticoagulant or antiplatelet                            agents. ASA Grade Assessment: II - A patient with                            mild systemic disease. After reviewing the risks                            and benefits, the patient was deemed in                            satisfactory condition to undergo the procedure.                           After obtaining informed consent, the endoscope was  passed under direct vision. Throughout the                            procedure, the patient's blood pressure, pulse, and                            oxygen saturations were monitored continuously. The                            Model GIF-[id number]) scope was introduced                            through the mouth, and advanced to the second part                            of duodenum.  The upper GI endoscopy was                            accomplished without difficulty. The patient                            tolerated the procedure well. Scope In: Scope Out: Findings:                 No endoscopic abnormality was evident in the                            esophagus to explain the patient's complaint of                            dysphagia. It was decided, however, to proceed with                            dilation of the entire esophagus. A guidewire was                            placed and the scope was withdrawn. Dilation was                            performed with a Savary dilator with no resistance                            at 16 mm.                           Patchy mildly erythematous mucosa without bleeding                            was found in the gastric body and in the gastric                            antrum. Biopsies were taken with a cold forceps for  histology.                           The exam of the stomach was otherwise normal.                           The duodenal bulb and second portion of the                            duodenum were normal. Complications:            No immediate complications. Estimated Blood Loss:     Estimated blood loss was minimal. Impression:               - No endoscopic esophageal abnormality to explain                            patient's dysphagia. Esophagus dilated.                           - Erythematous mucosa in the gastric body and                            antrum. Biopsied.                           - Normal duodenal bulb and second portion of the                            duodenum. Recommendation:           - Patient has a contact number available for                            emergencies. The signs and symptoms of potential                            delayed complications were discussed with the                            patient. Return to normal activities tomorrow.                             Written discharge instructions were provided to the                            patient.                           - Clear liquid diet for 2 hours, then advance as                            tolerated to soft diet today. Resume previous diet.                           - Continue present medications.                           -  Await pathology results.                           - No GI cause for dysphagia was found.                           - ENT evaluation revommended. Ladene Artist, MD 09/06/2017 3:13:00 PM This report has been signed electronically.

## 2017-09-06 NOTE — Progress Notes (Signed)
Called to room to assist during endoscopic procedure.  Patient ID and intended procedure confirmed with present staff. Received instructions for my participation in the procedure from the performing physician.  

## 2017-09-06 NOTE — Op Note (Signed)
Index Patient Name: Rebecca Carr Procedure Date: 09/06/2017 2:28 PM MRN: [id number] Endoscopist: Ladene Artist , MD Age: 67 Referring MD:  Date of Birth: [birth year] Gender: Female Account #: [id number] Procedure:                Colonoscopy Indications:              Surveillance: Personal history of adenomatous                            polyps on last colonoscopy > 3 years ago. Family                            history of colon cancer. Medicines:                Monitored Anesthesia Care Procedure:                Pre-Anesthesia Assessment:                           - Prior to the procedure, a History and Physical                            was performed, and patient medications and                            allergies were reviewed. The patient's tolerance of                            previous anesthesia was also reviewed. The risks                            and benefits of the procedure and the sedation                            options and risks were discussed with the patient.                            All questions were answered, and informed consent                            was obtained. Prior Anticoagulants: The patient has                            taken no previous anticoagulant or antiplatelet                            agents. ASA Grade Assessment: II - A patient with                            mild systemic disease. After reviewing the risks                            and benefits, the patient was deemed in  satisfactory condition to undergo the procedure.                           After obtaining informed consent, the colonoscope                            was passed under direct vision. Throughout the                            procedure, the patient's blood pressure, pulse, and                            oxygen saturations were monitored continuously. The                            Model PCF-H190DL [id number]) scope was  introduced                            through the anus and advanced to the the cecum,                            identified by appendiceal orifice and ileocecal                            valve. The ileocecal valve, appendiceal orifice,                            and rectum were photographed. The quality of the                            bowel preparation was good. The colonoscopy was                            performed without difficulty. The patient tolerated                            the procedure well. Scope In: 2:38:50 PM Scope Out: 2:50:37 PM Scope Withdrawal Time: 0 hours 9 minutes 50 seconds  Total Procedure Duration: 0 hours 11 minutes 47 seconds  Findings:                 The perianal and digital rectal examinations were                            normal.                           A 4 mm polyp was found in the hepatic flexure. The                            polyp was sessile. The polyp was removed with a                            cold biopsy forceps. Resection and retrieval were  complete.                           Multiple small-mouthed diverticula were found in                            the left colon. There was evidence of diverticular                            spasm. Peri-diverticular erythema was seen. There                            was evidence of an impacted diverticulum. There was                            no evidence of diverticular bleeding.                           The exam was otherwise without abnormality on                            direct and retroflexion views. Complications:            No immediate complications. Estimated blood loss:                            None. Estimated Blood Loss:     Estimated blood loss: none. Impression:               - One 4 mm polyp at the hepatic flexure, removed                            with a cold biopsy forceps. Resected and retrieved.                           - Moderate diverticulosis in  the left colon. Recommendation:           - Repeat colonoscopy in 3 years for surveillance.                           - Patient has a contact number available for                            emergencies. The signs and symptoms of potential                            delayed complications were discussed with the                            patient. Return to normal activities tomorrow.                            Written discharge instructions were provided to the                            patient.                           -  High fiber diet.                           - Continue present medications.                           - Await pathology results. Ladene Artist, MD 09/06/2017 3:03:55 PM This report has been signed electronically.

## 2017-09-06 NOTE — Progress Notes (Signed)
Spontaneous respirations throughout. VSS. Resting comfortably. To PACU on room air. Report to  RN. 

## 2017-09-06 NOTE — Patient Instructions (Signed)
Handouts given:  Dilation, Polyps, and Stricture Patient on Dilation Diet the rest of today  YOU HAD AN ENDOSCOPIC PROCEDURE TODAY AT [facility]:   Refer to the procedure report that was given to you for any specific questions about what was found during the examination.  If the procedure report does not answer your questions, please call your gastroenterologist to clarify.  If you requested that your care partner not be given the details of your procedure findings, then the procedure report has been included in a sealed envelope for you to review at your convenience later.  YOU SHOULD EXPECT: Some feelings of bloating in the abdomen. Passage of more gas than usual.  Walking can help get rid of the air that was put into your GI tract during the procedure and reduce the bloating. If you had a lower endoscopy (such as a colonoscopy or flexible sigmoidoscopy) you may notice spotting of blood in your stool or on the toilet paper. If you underwent a bowel prep for your procedure, you may not have a normal bowel movement for a few days.  Please Note:  You might notice some irritation and congestion in your nose or some drainage.  This is from the oxygen used during your procedure.  There is no need for concern and it should clear up in a day or so.  SYMPTOMS TO REPORT IMMEDIATELY:   Following lower endoscopy (colonoscopy or flexible sigmoidoscopy):  Excessive amounts of blood in the stool  Significant tenderness or worsening of abdominal pains  Swelling of the abdomen that is new, acute  Fever of 100F or higher   Following upper endoscopy (EGD)  Vomiting of blood or coffee ground material  New chest pain or pain under the shoulder blades  Painful or persistently difficult swallowing  New shortness of breath  Fever of 100F or higher  Black, tarry-looking stools  For urgent or emergent issues, a gastroenterologist can be reached at any hour by calling [phone].   DIET:   We do recommend a small meal at first, but then you may proceed to your regular diet.  Drink plenty of fluids but you should avoid alcoholic beverages for 24 hours.  ACTIVITY:  You should plan to take it easy for the rest of today and you should NOT DRIVE or use heavy machinery until tomorrow (because of the sedation medicines used during the test).    FOLLOW UP: Our staff will call the number listed on your records the next business day following your procedure to check on you and address any questions or concerns that you may have regarding the information given to you following your procedure. If we do not reach you, we will leave a message.  However, if you are feeling well and you are not experiencing any problems, there is no need to return our call.  We will assume that you have returned to your regular daily activities without incident.  If any biopsies were taken you will be contacted by phone or by letter within the next 1-3 weeks.  Please call us at [phone] if you have not heard about the biopsies in 3 weeks.    SIGNATURES/CONFIDENTIALITY: You and/or your care partner have signed paperwork which will be entered into your electronic medical record.  These signatures attest to the fact that that the information above on your After Visit Summary has been reviewed and is understood.  Full responsibility of the confidentiality of this discharge information lies with  you and/or your care-partner.

## 2017-09-07 ENCOUNTER — Telehealth: Payer: Self-pay

## 2017-09-07 ENCOUNTER — Telehealth: Payer: Self-pay | Admitting: *Deleted

## 2017-09-07 NOTE — Telephone Encounter (Signed)
  Follow up Call-  Call back number 09/06/2017  Post procedure Call Back phone  # [phone]  Permission to leave phone message Yes  Some recent data might be hidden     Patient questions:  Do you have a fever, pain , or abdominal swelling? No. Pain Score  0 *  Have you tolerated food without any problems? Yes.    Have you been able to return to your normal activities? Yes.    Do you have any questions about your discharge instructions: Diet   No. Medications  No. Follow up visit  No.  Do you have questions or concerns about your Care? Yes.    Actions: * If pain score is 4 or above: No action needed, pain <4.

## 2017-09-07 NOTE — Telephone Encounter (Signed)
1st follow-up call attempt.  Voicemail with first name identifier.  Message left to call if questions or concerns.

## 2017-09-20 ENCOUNTER — Encounter: Payer: Self-pay | Admitting: Gastroenterology

## 2017-09-23 ENCOUNTER — Telehealth: Payer: Self-pay | Admitting: Gastroenterology

## 2017-09-23 NOTE — Telephone Encounter (Signed)
Result letter reviewed with pt and questions answered. 

## 2017-09-23 NOTE — Telephone Encounter (Signed)
Patient requesting a call from the nurse to discuss results for procedure on 7.30.19.

## 2017-09-25 ENCOUNTER — Other Ambulatory Visit: Payer: Self-pay | Admitting: Cardiology

## 2017-10-06 DIAGNOSIS — R768 Other specified abnormal immunological findings in serum: Secondary | ICD-10-CM | POA: Diagnosis not present

## 2017-10-06 DIAGNOSIS — D8989 Other specified disorders involving the immune mechanism, not elsewhere classified: Secondary | ICD-10-CM | POA: Diagnosis not present

## 2017-10-06 DIAGNOSIS — R5383 Other fatigue: Secondary | ICD-10-CM | POA: Diagnosis not present

## 2017-10-06 DIAGNOSIS — M3500 Sicca syndrome, unspecified: Secondary | ICD-10-CM | POA: Diagnosis not present

## 2017-10-21 DIAGNOSIS — G4733 Obstructive sleep apnea (adult) (pediatric): Secondary | ICD-10-CM | POA: Diagnosis not present

## 2017-10-21 DIAGNOSIS — E782 Mixed hyperlipidemia: Secondary | ICD-10-CM | POA: Diagnosis not present

## 2017-10-21 DIAGNOSIS — R300 Dysuria: Secondary | ICD-10-CM | POA: Diagnosis not present

## 2017-10-21 DIAGNOSIS — E063 Autoimmune thyroiditis: Secondary | ICD-10-CM | POA: Diagnosis not present

## 2017-10-21 DIAGNOSIS — E7212 Methylenetetrahydrofolate reductase deficiency: Secondary | ICD-10-CM | POA: Diagnosis not present

## 2017-10-21 DIAGNOSIS — F411 Generalized anxiety disorder: Secondary | ICD-10-CM | POA: Diagnosis not present

## 2017-10-21 DIAGNOSIS — E669 Obesity, unspecified: Secondary | ICD-10-CM | POA: Diagnosis not present

## 2017-10-21 DIAGNOSIS — I471 Supraventricular tachycardia: Secondary | ICD-10-CM | POA: Diagnosis not present

## 2017-10-21 DIAGNOSIS — Z0000 Encounter for general adult medical examination without abnormal findings: Secondary | ICD-10-CM | POA: Diagnosis not present

## 2017-10-21 DIAGNOSIS — R739 Hyperglycemia, unspecified: Secondary | ICD-10-CM | POA: Diagnosis not present

## 2017-10-21 DIAGNOSIS — Z23 Encounter for immunization: Secondary | ICD-10-CM | POA: Diagnosis not present

## 2017-10-21 DIAGNOSIS — N39498 Other specified urinary incontinence: Secondary | ICD-10-CM | POA: Diagnosis not present

## 2017-10-27 DIAGNOSIS — E782 Mixed hyperlipidemia: Secondary | ICD-10-CM | POA: Diagnosis not present

## 2017-10-27 DIAGNOSIS — Q871 Congenital malformation syndromes predominantly associated with short stature: Secondary | ICD-10-CM | POA: Diagnosis not present

## 2017-10-27 DIAGNOSIS — Z8639 Personal history of other endocrine, nutritional and metabolic disease: Secondary | ICD-10-CM | POA: Diagnosis not present

## 2017-10-27 DIAGNOSIS — K130 Diseases of lips: Secondary | ICD-10-CM | POA: Diagnosis not present

## 2017-10-27 DIAGNOSIS — I471 Supraventricular tachycardia: Secondary | ICD-10-CM | POA: Diagnosis not present

## 2017-10-27 DIAGNOSIS — E7212 Methylenetetrahydrofolate reductase deficiency: Secondary | ICD-10-CM | POA: Diagnosis not present

## 2017-10-27 DIAGNOSIS — R739 Hyperglycemia, unspecified: Secondary | ICD-10-CM | POA: Diagnosis not present

## 2017-10-27 DIAGNOSIS — E063 Autoimmune thyroiditis: Secondary | ICD-10-CM | POA: Diagnosis not present

## 2017-11-02 DIAGNOSIS — Z87891 Personal history of nicotine dependence: Secondary | ICD-10-CM | POA: Diagnosis not present

## 2017-11-02 DIAGNOSIS — Z136 Encounter for screening for cardiovascular disorders: Secondary | ICD-10-CM | POA: Diagnosis not present

## 2017-11-03 DIAGNOSIS — M359 Systemic involvement of connective tissue, unspecified: Secondary | ICD-10-CM | POA: Diagnosis not present

## 2017-11-03 DIAGNOSIS — R760 Raised antibody titer: Secondary | ICD-10-CM | POA: Diagnosis not present

## 2017-11-03 DIAGNOSIS — F321 Major depressive disorder, single episode, moderate: Secondary | ICD-10-CM | POA: Diagnosis not present

## 2017-11-15 DIAGNOSIS — Z6837 Body mass index (BMI) 37.0-37.9, adult: Secondary | ICD-10-CM | POA: Diagnosis not present

## 2017-11-15 DIAGNOSIS — G4733 Obstructive sleep apnea (adult) (pediatric): Secondary | ICD-10-CM | POA: Diagnosis not present

## 2017-11-15 DIAGNOSIS — I471 Supraventricular tachycardia: Secondary | ICD-10-CM | POA: Diagnosis not present

## 2017-11-16 DIAGNOSIS — G4733 Obstructive sleep apnea (adult) (pediatric): Secondary | ICD-10-CM | POA: Diagnosis not present

## 2017-11-16 DIAGNOSIS — G4761 Periodic limb movement disorder: Secondary | ICD-10-CM | POA: Diagnosis not present

## 2017-11-16 DIAGNOSIS — Z6837 Body mass index (BMI) 37.0-37.9, adult: Secondary | ICD-10-CM | POA: Diagnosis not present

## 2017-11-16 DIAGNOSIS — I471 Supraventricular tachycardia: Secondary | ICD-10-CM | POA: Diagnosis not present

## 2017-11-16 DIAGNOSIS — R0902 Hypoxemia: Secondary | ICD-10-CM | POA: Diagnosis not present

## 2017-11-23 DIAGNOSIS — Z6837 Body mass index (BMI) 37.0-37.9, adult: Secondary | ICD-10-CM | POA: Diagnosis not present

## 2017-11-23 DIAGNOSIS — I471 Supraventricular tachycardia: Secondary | ICD-10-CM | POA: Diagnosis not present

## 2017-11-23 DIAGNOSIS — G4733 Obstructive sleep apnea (adult) (pediatric): Secondary | ICD-10-CM | POA: Diagnosis not present

## 2017-12-06 DIAGNOSIS — G4733 Obstructive sleep apnea (adult) (pediatric): Secondary | ICD-10-CM | POA: Diagnosis not present

## 2017-12-06 DIAGNOSIS — G4761 Periodic limb movement disorder: Secondary | ICD-10-CM | POA: Diagnosis not present

## 2017-12-27 DIAGNOSIS — E063 Autoimmune thyroiditis: Secondary | ICD-10-CM | POA: Diagnosis not present

## 2018-01-03 DIAGNOSIS — M19071 Primary osteoarthritis, right ankle and foot: Secondary | ICD-10-CM | POA: Diagnosis not present

## 2018-01-03 DIAGNOSIS — M67371 Transient synovitis, right ankle and foot: Secondary | ICD-10-CM | POA: Diagnosis not present

## 2018-01-03 DIAGNOSIS — S90851A Superficial foreign body, right foot, initial encounter: Secondary | ICD-10-CM | POA: Diagnosis not present

## 2018-01-09 DIAGNOSIS — F419 Anxiety disorder, unspecified: Secondary | ICD-10-CM | POA: Diagnosis not present

## 2018-01-09 DIAGNOSIS — G3184 Mild cognitive impairment, so stated: Secondary | ICD-10-CM | POA: Diagnosis not present

## 2018-01-09 DIAGNOSIS — F321 Major depressive disorder, single episode, moderate: Secondary | ICD-10-CM | POA: Diagnosis not present

## 2018-01-10 DIAGNOSIS — G2581 Restless legs syndrome: Secondary | ICD-10-CM | POA: Diagnosis not present

## 2018-01-10 DIAGNOSIS — G4733 Obstructive sleep apnea (adult) (pediatric): Secondary | ICD-10-CM | POA: Diagnosis not present

## 2018-01-12 DIAGNOSIS — R930 Abnormal findings on diagnostic imaging of skull and head, not elsewhere classified: Secondary | ICD-10-CM | POA: Diagnosis not present

## 2018-01-12 DIAGNOSIS — G3184 Mild cognitive impairment, so stated: Secondary | ICD-10-CM | POA: Diagnosis not present

## 2018-01-12 DIAGNOSIS — R9082 White matter disease, unspecified: Secondary | ICD-10-CM | POA: Diagnosis not present

## 2018-01-12 DIAGNOSIS — F321 Major depressive disorder, single episode, moderate: Secondary | ICD-10-CM | POA: Diagnosis not present

## 2018-01-12 DIAGNOSIS — H538 Other visual disturbances: Secondary | ICD-10-CM | POA: Diagnosis not present

## 2018-02-09 DIAGNOSIS — M359 Systemic involvement of connective tissue, unspecified: Secondary | ICD-10-CM | POA: Diagnosis not present

## 2018-02-09 DIAGNOSIS — R760 Raised antibody titer: Secondary | ICD-10-CM | POA: Diagnosis not present

## 2018-02-15 ENCOUNTER — Telehealth: Payer: Self-pay

## 2018-02-15 MED ORDER — CLINDAMYCIN HCL 300 MG PO CAPS: 600.0000 mg | ORAL_CAPSULE | Freq: Once | ORAL | 5 refills | Status: AC

## 2018-02-15 NOTE — Telephone Encounter (Signed)
-----   Message from Rebecca Carr sent at 02/15/2018  2:30 PM EST ----- Patient is calling concerning recently got diagnose lupus and a heart problem and wanting to know if you have to do anything before she go to dentist for teeth cleaning in the morning.   She said there is another medication that she is on escitalopram and they said to make sure that you aware of that please call her at [phone]. cbr

## 2018-02-15 NOTE — Telephone Encounter (Signed)
Spoke with pt who states she will just be having a regular teeth cleaning in the morning but states she bleeds with any type or procedure and wanted to be cleared by her cardiologist before hand. Will route to MD and nurse.

## 2018-02-15 NOTE — Telephone Encounter (Signed)
LEFT DETAIL MESSAGE ON VOICE MAIL GIVING INSTRUCTIONS .   RX E-SENT TO PHARMACY  IF PATIENT HAS QUESTION MAY CALL TOMORROW 02/16/18

## 2018-02-15 NOTE — Telephone Encounter (Signed)
Usually in this case, with mild prolapse of mild regurgitation of the mitral valve we would not recommend endocarditis prophylaxis.  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  Standard antibiotic prophylaxis would be reasonable but not necessary.  If there is increased bleed risk, would just go ahead & provide prophylaxis.     Glenetta Hew, MD

## 2018-02-19 DIAGNOSIS — Z880 Allergy status to penicillin: Secondary | ICD-10-CM | POA: Diagnosis not present

## 2018-02-19 DIAGNOSIS — Z91041 Radiographic dye allergy status: Secondary | ICD-10-CM | POA: Diagnosis not present

## 2018-02-19 DIAGNOSIS — Z7982 Long term (current) use of aspirin: Secondary | ICD-10-CM | POA: Diagnosis not present

## 2018-02-19 DIAGNOSIS — K219 Gastro-esophageal reflux disease without esophagitis: Secondary | ICD-10-CM | POA: Diagnosis not present

## 2018-02-19 DIAGNOSIS — F17220 Nicotine dependence, chewing tobacco, uncomplicated: Secondary | ICD-10-CM | POA: Diagnosis not present

## 2018-02-19 DIAGNOSIS — M47892 Other spondylosis, cervical region: Secondary | ICD-10-CM | POA: Diagnosis not present

## 2018-02-19 DIAGNOSIS — E079 Disorder of thyroid, unspecified: Secondary | ICD-10-CM | POA: Diagnosis not present

## 2018-02-19 DIAGNOSIS — K209 Esophagitis, unspecified: Secondary | ICD-10-CM | POA: Diagnosis not present

## 2018-02-19 DIAGNOSIS — R070 Pain in throat: Secondary | ICD-10-CM | POA: Diagnosis not present

## 2018-02-19 DIAGNOSIS — E785 Hyperlipidemia, unspecified: Secondary | ICD-10-CM | POA: Diagnosis not present

## 2018-02-19 DIAGNOSIS — Z79899 Other long term (current) drug therapy: Secondary | ICD-10-CM | POA: Diagnosis not present

## 2018-02-24 DIAGNOSIS — E063 Autoimmune thyroiditis: Secondary | ICD-10-CM | POA: Diagnosis not present

## 2018-03-01 DIAGNOSIS — R1310 Dysphagia, unspecified: Secondary | ICD-10-CM | POA: Diagnosis not present

## 2018-03-01 DIAGNOSIS — R9389 Abnormal findings on diagnostic imaging of other specified body structures: Secondary | ICD-10-CM | POA: Diagnosis not present

## 2018-03-01 DIAGNOSIS — K219 Gastro-esophageal reflux disease without esophagitis: Secondary | ICD-10-CM | POA: Diagnosis not present

## 2018-03-01 DIAGNOSIS — Z86010 Personal history of colonic polyps: Secondary | ICD-10-CM | POA: Diagnosis not present

## 2018-03-09 DIAGNOSIS — Z9989 Dependence on other enabling machines and devices: Secondary | ICD-10-CM | POA: Diagnosis not present

## 2018-03-09 DIAGNOSIS — G2581 Restless legs syndrome: Secondary | ICD-10-CM | POA: Diagnosis not present

## 2018-03-09 DIAGNOSIS — I471 Supraventricular tachycardia: Secondary | ICD-10-CM | POA: Diagnosis not present

## 2018-03-09 DIAGNOSIS — Z6837 Body mass index (BMI) 37.0-37.9, adult: Secondary | ICD-10-CM | POA: Diagnosis not present

## 2018-03-09 DIAGNOSIS — G4733 Obstructive sleep apnea (adult) (pediatric): Secondary | ICD-10-CM | POA: Diagnosis not present

## 2018-03-24 DIAGNOSIS — K219 Gastro-esophageal reflux disease without esophagitis: Secondary | ICD-10-CM | POA: Diagnosis not present

## 2018-03-24 DIAGNOSIS — F411 Generalized anxiety disorder: Secondary | ICD-10-CM | POA: Diagnosis not present

## 2018-03-24 DIAGNOSIS — Z23 Encounter for immunization: Secondary | ICD-10-CM | POA: Diagnosis not present

## 2018-03-24 DIAGNOSIS — E669 Obesity, unspecified: Secondary | ICD-10-CM | POA: Diagnosis not present

## 2018-03-24 DIAGNOSIS — E063 Autoimmune thyroiditis: Secondary | ICD-10-CM | POA: Diagnosis not present

## 2018-03-24 DIAGNOSIS — E782 Mixed hyperlipidemia: Secondary | ICD-10-CM | POA: Diagnosis not present

## 2018-03-24 DIAGNOSIS — F330 Major depressive disorder, recurrent, mild: Secondary | ICD-10-CM | POA: Diagnosis not present

## 2018-03-26 ENCOUNTER — Other Ambulatory Visit: Payer: Self-pay | Admitting: Cardiology

## 2018-03-27 NOTE — Telephone Encounter (Signed)
Rx(s) sent to pharmacy electronically.  

## 2018-04-14 DIAGNOSIS — R1310 Dysphagia, unspecified: Secondary | ICD-10-CM | POA: Diagnosis not present

## 2018-04-14 DIAGNOSIS — K219 Gastro-esophageal reflux disease without esophagitis: Secondary | ICD-10-CM | POA: Diagnosis not present

## 2018-04-14 DIAGNOSIS — K449 Diaphragmatic hernia without obstruction or gangrene: Secondary | ICD-10-CM | POA: Diagnosis not present

## 2018-04-20 ENCOUNTER — Other Ambulatory Visit: Payer: Self-pay | Admitting: Cardiology

## 2018-04-20 ENCOUNTER — Telehealth: Payer: Self-pay | Admitting: Cardiology

## 2018-04-20 MED ORDER — METOPROLOL TARTRATE 25 MG PO TABS: ORAL_TABLET | ORAL | 0 refills | Status: DC

## 2018-04-20 NOTE — Telephone Encounter (Signed)
°*  STAT* If patient is at the pharmacy, call can be transferred to refill team.   1. Which medications need to be refilled? (please list name of each medication and dose if known)  metoprolol tartrate (LOPRESSOR) 25 MG tablet  2. Which pharmacy/location (including street and city if local pharmacy) is medication to be sent to? [place] [phone]  3. Do they need a 30 day or 90 day supply? 90  Pt was unaware that her morning dose was cut in half, and she had been taking a full tablet in the morning instead of a half. She may run out early and wanted to get a refill

## 2018-04-20 NOTE — Telephone Encounter (Signed)
Spoke with pt, aware of pharm md recommendations. She wrote down the name of the alternate medication.

## 2018-04-20 NOTE — Telephone Encounter (Signed)
She should watch her heart rate for 1-2 weeks after starting the hydrochloroquine as it can decrease metabolism of metoprolol (make it stay in the body longer).  This could potentially lower her heart rate, but she should be fine.    Other medication ?  Desvenlafaxine maybe? If so, that is okay with her cardiac meds

## 2018-04-20 NOTE — Telephone Encounter (Signed)
Will forward to pharm md to review and advise. 

## 2018-04-20 NOTE — Telephone Encounter (Signed)
Unable to reach pt or leave a message mailbox is full 

## 2018-04-20 NOTE — Telephone Encounter (Signed)
Pt called to let us know she was put on two new medications, but did not know the dosages. She will know the doses when she picks them up tomorrow.   Hydroxychloroquine Denlafaxine  She is worried about possible interactions with her current medications, and wants clarification that she will be safe to take them all at the same time.  She also asked to leave a detailed message, because she gets too many spam calls and won't pick up a number she doesn't recognize.

## 2018-05-11 DIAGNOSIS — J069 Acute upper respiratory infection, unspecified: Secondary | ICD-10-CM | POA: Diagnosis not present

## 2018-06-07 DIAGNOSIS — H04123 Dry eye syndrome of bilateral lacrimal glands: Secondary | ICD-10-CM | POA: Diagnosis not present

## 2018-06-07 DIAGNOSIS — H2513 Age-related nuclear cataract, bilateral: Secondary | ICD-10-CM | POA: Diagnosis not present

## 2018-06-07 DIAGNOSIS — H1045 Other chronic allergic conjunctivitis: Secondary | ICD-10-CM | POA: Diagnosis not present

## 2018-06-07 DIAGNOSIS — Z79899 Other long term (current) drug therapy: Secondary | ICD-10-CM | POA: Diagnosis not present

## 2018-07-05 DIAGNOSIS — E063 Autoimmune thyroiditis: Secondary | ICD-10-CM | POA: Diagnosis not present

## 2018-07-05 DIAGNOSIS — E782 Mixed hyperlipidemia: Secondary | ICD-10-CM | POA: Diagnosis not present

## 2018-07-14 ENCOUNTER — Telehealth: Payer: Medicare Other | Admitting: Family

## 2018-07-14 DIAGNOSIS — L089 Local infection of the skin and subcutaneous tissue, unspecified: Secondary | ICD-10-CM | POA: Diagnosis not present

## 2018-07-14 MED ORDER — MUPIROCIN 2 % EX OINT: 1.0000 "application " | TOPICAL_OINTMENT | Freq: Two times a day (BID) | CUTANEOUS | 0 refills | Status: DC

## 2018-07-14 NOTE — Progress Notes (Signed)
E Visit for Rash  We are sorry that you are not feeling well. Here is how we plan to help!  It looks like you have a healing skin pustule. The area looks like it is improving and since it occurred over a week ago, we will hold off of any prophylaxis dose of doxycycline. I have prescribed Topical mupiricin that you will apply twice a day. This is an antibiotic ointment. Keep the area clean and dry. If you start developing  any rash, fevers, joint pain, or your headache does not improve or worsens you will need to be seen face to face.    Approximately 5 minutes was spent documenting and reviewing patient's chart.    HOME CARE:   Take cool showers and avoid direct sunlight.  Apply cool compress or wet dressings.  Take a bath in an oatmeal bath.  Sprinkle content of one Aveeno packet under running faucet with comfortably warm water.  Bathe for 15-20 minutes, 1-2 times daily.  Pat dry with a towel. Do not rub the rash.  Use hydrocortisone cream.  Take an antihistamine like Benadryl for widespread rashes that itch.  The adult dose of Benadryl is 25-50 mg by mouth 4 times daily.  Caution:  This type of medication may cause sleepiness.  Do not drink alcohol, drive, or operate dangerous machinery while taking antihistamines.  Do not take these medications if you have prostate enlargement.  Read package instructions thoroughly on all medications that you take.  GET HELP RIGHT AWAY IF:   Symptoms don't go away after treatment.  Severe itching that persists.  If you rash spreads or swells.  If you rash begins to smell.  If it blisters and opens or develops a yellow-brown crust.  You develop a fever.  You have a sore throat.  You become short of breath.  MAKE SURE YOU:  Understand these instructions. Will watch your condition. Will get help right away if you are not doing well or get worse.  Thank you for choosing an e-visit. Your e-visit answers were reviewed by a board  certified advanced clinical practitioner to complete your personal care plan. Depending upon the condition, your plan could have included both over the counter or prescription medications. Please review your pharmacy choice. Be sure that the pharmacy you have chosen is open so that you can pick up your prescription now.  If there is a problem you may message your provider in [place] to have the prescription routed to another pharmacy. Your safety is important to [place]. If you have drug allergies check your prescription carefully.  For the next 24 hours, you can use MyChart to ask questions about today's visit, request a non-urgent call back, or ask for a work or school excuse from your e-visit provider. You will get an email in the next two days asking about your experience. I hope that your e-visit has been valuable and will speed your recovery.

## 2018-07-21 DIAGNOSIS — M359 Systemic involvement of connective tissue, unspecified: Secondary | ICD-10-CM | POA: Diagnosis not present

## 2018-07-21 DIAGNOSIS — R760 Raised antibody titer: Secondary | ICD-10-CM | POA: Diagnosis not present

## 2018-08-31 DIAGNOSIS — J329 Chronic sinusitis, unspecified: Secondary | ICD-10-CM | POA: Diagnosis not present

## 2018-08-31 DIAGNOSIS — H5789 Other specified disorders of eye and adnexa: Secondary | ICD-10-CM | POA: Diagnosis not present

## 2018-08-31 DIAGNOSIS — R0981 Nasal congestion: Secondary | ICD-10-CM | POA: Diagnosis not present

## 2018-09-13 DIAGNOSIS — H2513 Age-related nuclear cataract, bilateral: Secondary | ICD-10-CM | POA: Diagnosis not present

## 2018-09-13 DIAGNOSIS — H04123 Dry eye syndrome of bilateral lacrimal glands: Secondary | ICD-10-CM | POA: Diagnosis not present

## 2018-10-04 DIAGNOSIS — L821 Other seborrheic keratosis: Secondary | ICD-10-CM | POA: Diagnosis not present

## 2018-10-04 DIAGNOSIS — L700 Acne vulgaris: Secondary | ICD-10-CM | POA: Diagnosis not present

## 2018-10-12 DIAGNOSIS — Z79899 Other long term (current) drug therapy: Secondary | ICD-10-CM | POA: Diagnosis not present

## 2018-10-12 DIAGNOSIS — R760 Raised antibody titer: Secondary | ICD-10-CM | POA: Diagnosis not present

## 2018-10-12 DIAGNOSIS — M3500 Sicca syndrome, unspecified: Secondary | ICD-10-CM | POA: Diagnosis not present

## 2018-10-12 DIAGNOSIS — M359 Systemic involvement of connective tissue, unspecified: Secondary | ICD-10-CM | POA: Diagnosis not present

## 2018-10-12 DIAGNOSIS — R040 Epistaxis: Secondary | ICD-10-CM | POA: Diagnosis not present

## 2018-10-18 DIAGNOSIS — J381 Polyp of vocal cord and larynx: Secondary | ICD-10-CM | POA: Diagnosis not present

## 2018-10-18 DIAGNOSIS — J3489 Other specified disorders of nose and nasal sinuses: Secondary | ICD-10-CM | POA: Diagnosis not present

## 2018-10-25 DIAGNOSIS — N952 Postmenopausal atrophic vaginitis: Secondary | ICD-10-CM | POA: Diagnosis not present

## 2018-10-25 DIAGNOSIS — Z6835 Body mass index (BMI) 35.0-35.9, adult: Secondary | ICD-10-CM | POA: Diagnosis not present

## 2018-10-25 DIAGNOSIS — N3945 Continuous leakage: Secondary | ICD-10-CM | POA: Diagnosis not present

## 2018-10-25 DIAGNOSIS — N958 Other specified menopausal and perimenopausal disorders: Secondary | ICD-10-CM | POA: Diagnosis not present

## 2018-10-25 DIAGNOSIS — Z124 Encounter for screening for malignant neoplasm of cervix: Secondary | ICD-10-CM | POA: Diagnosis not present

## 2018-10-25 DIAGNOSIS — N9089 Other specified noninflammatory disorders of vulva and perineum: Secondary | ICD-10-CM | POA: Diagnosis not present

## 2018-10-25 DIAGNOSIS — Z779 Other contact with and (suspected) exposures hazardous to health: Secondary | ICD-10-CM | POA: Diagnosis not present

## 2018-10-25 DIAGNOSIS — M8588 Other specified disorders of bone density and structure, other site: Secondary | ICD-10-CM | POA: Diagnosis not present

## 2018-10-25 DIAGNOSIS — Z1231 Encounter for screening mammogram for malignant neoplasm of breast: Secondary | ICD-10-CM | POA: Diagnosis not present

## 2018-10-26 DIAGNOSIS — K219 Gastro-esophageal reflux disease without esophagitis: Secondary | ICD-10-CM | POA: Diagnosis not present

## 2018-10-26 DIAGNOSIS — E669 Obesity, unspecified: Secondary | ICD-10-CM | POA: Diagnosis not present

## 2018-10-26 DIAGNOSIS — E782 Mixed hyperlipidemia: Secondary | ICD-10-CM | POA: Diagnosis not present

## 2018-10-26 DIAGNOSIS — Z23 Encounter for immunization: Secondary | ICD-10-CM | POA: Diagnosis not present

## 2018-10-26 DIAGNOSIS — F330 Major depressive disorder, recurrent, mild: Secondary | ICD-10-CM | POA: Diagnosis not present

## 2018-10-26 DIAGNOSIS — F411 Generalized anxiety disorder: Secondary | ICD-10-CM | POA: Diagnosis not present

## 2018-10-26 DIAGNOSIS — E063 Autoimmune thyroiditis: Secondary | ICD-10-CM | POA: Diagnosis not present

## 2018-11-07 ENCOUNTER — Other Ambulatory Visit: Payer: Self-pay

## 2018-11-07 ENCOUNTER — Other Ambulatory Visit: Payer: Self-pay | Admitting: Cardiology

## 2018-11-07 MED ORDER — METOPROLOL TARTRATE 25 MG PO TABS: ORAL_TABLET | ORAL | 0 refills | Status: DC

## 2018-11-07 NOTE — Telephone Encounter (Signed)
Called pharmacy and verified that metoprolol rx was received. Pharmacist confirmed rx was received and ready to be picked up. Called patient and made her aware. Scheduled yearly visit with Dr Ellyn Hack for tomorrow as well.

## 2018-11-07 NOTE — Telephone Encounter (Signed)
° ° °  Patient calling, angry. Pharmacy states they do not have metoprolol tartrate (LOPRESSOR) 25 MG tablet order. Advised patient :-Prescribing Status: Receipt confirmed by pharmacy (11/07/2018 10:34 AM EDT)  Patient request prescription be sent  again

## 2018-11-08 ENCOUNTER — Encounter: Payer: Self-pay | Admitting: Cardiology

## 2018-11-08 ENCOUNTER — Other Ambulatory Visit: Payer: Self-pay

## 2018-11-08 ENCOUNTER — Ambulatory Visit (INDEPENDENT_AMBULATORY_CARE_PROVIDER_SITE_OTHER): Payer: Medicare Other | Admitting: Cardiology

## 2018-11-08 VITALS — BP 113/75 | HR 81 | Temp 96.8°F | Ht 68.0 in | Wt 227.6 lb

## 2018-11-08 DIAGNOSIS — I493 Ventricular premature depolarization: Secondary | ICD-10-CM

## 2018-11-08 DIAGNOSIS — R002 Palpitations: Secondary | ICD-10-CM

## 2018-11-08 DIAGNOSIS — R0600 Dyspnea, unspecified: Secondary | ICD-10-CM

## 2018-11-08 DIAGNOSIS — R0609 Other forms of dyspnea: Secondary | ICD-10-CM | POA: Diagnosis not present

## 2018-11-08 DIAGNOSIS — I4729 Other ventricular tachycardia: Secondary | ICD-10-CM

## 2018-11-08 DIAGNOSIS — I472 Ventricular tachycardia: Secondary | ICD-10-CM | POA: Diagnosis not present

## 2018-11-08 MED ORDER — METOPROLOL TARTRATE 25 MG PO TABS: ORAL_TABLET | ORAL | 0 refills | Status: DC

## 2018-11-08 MED ORDER — METOPROLOL TARTRATE 25 MG PO TABS: ORAL_TABLET | ORAL | 3 refills | Status: DC

## 2018-11-08 NOTE — Progress Notes (Signed)
PCP: Medicine, [facility] [name] Family, Dr. Juleen China Rheumatologist: Dr. Danne Harbor, also from [facility] Note: Chief Complaint  Patient presents with  . Follow-up    Palpitations  . Medication Problem    Thinks she is having withdrawal symptoms from SNRI.   HPI:Rebecca Carr is a 68 y.o. female originally seen for evaluations of palpitations and heart murmur noted to have 2 brief episodes of NSVT on monitor and negative Myoview.  She was last seen in July 2019 and presents here today for follow-up of palpitations.  She had reduced her caffeine and chocolate intake, and noted that her palpitations did notably improved.  She is actually down to taking 1/2 tablet of metoprolol in the morning and a full tablet in the evening of 25 mg.  The morning dose was reduced due to some fatigue.Rebecca Carr was last seen in July 2019--> At that time, she is noticing a lot of back and joint pain.  She is being evaluated by rheumatology for positive ANA panel.  (She now tells me she is been diagnosed with Hashimoto's thyroiditis, possible lupus and Sjogren's).  She noted that her palpitations are notably improved on low-dose beta-blocker.  Recent Hospitalizations: none  Studies Personally Reviewed - (if available, images/films reviewed: From Epic Chart or Care Everywhere)  None  Interval History: Mrs. Easily returns a bit upset b/c she thinks that she is going through "withdrawal" from Effexor -- PCP changed to Prozac.  After 5 d on Prozac started having multiple side effects - N/V, nightmares, more frequent Palpitations & dizziness. -- she stopped both.  Now gained back 3 lb, but symptoms of withdrawal are improving -- mostly nausea & palpitations. She tells me that she is not taking any additional dose of metoprolol.  By now, her symptoms seem to have died down a little bit.  She says that in addition to potential withdrawal from antidepressant, she has had some social  stress going on which is probably the reason why she needs the antidepressant.  Apparently she a says she had a kit at home that she really feels that he is a minor sociopath who seems to get in a lot of trouble.  She has not had any issues with chest tightness or pressure with rest or exertion, but does not do much activity and therefore has some exertional dyspnea.  She is chronically dizzy but has not had any syncope or near syncope.  Even though she had the palpitations and episodes with the medication changes nothing has been normal for her to feel as though she would pass out.  She has been noticing some changes in her vision as well. She is definitely lost weight since March when she was started on Effexor, unfortunately when she stopped it, she is gained back weight.  Cardiovascular ROS: positive for - Some exertional dyspnea and the palpitations noted above.  No prolonged spells of palpitations. negative for - chest pain, loss of consciousness, orthopnea, paroxysmal nocturnal dyspnea, shortness of breath or Syncope/near syncope, TIA/amaurosis fugax, claudication.  Rheumatologist mentioned that she may have a clotting disorder. (Dr. Lollie Sails - [facility])  ROS: A comprehensive was performed. Review of Systems  Constitutional: Positive for malaise/fatigue (Pretty much chronic).  HENT: Positive for congestion. Negative for nosebleeds.   Respiratory: Positive for shortness of breath (If she overexerts). Negative for wheezing.        Wearing a CPAP machine this caused problems with dry cracking skin on her  nose the point where she no longer wears it.  Cardiovascular: Negative for claudication. Palpitations: much less        Per HPI  Gastrointestinal: Positive for abdominal pain (cramping in the upper abdomen). Negative for blood in stool and melena.  Genitourinary: Negative for dysuria and hematuria.  Musculoskeletal: Positive for back pain, joint pain and myalgias (6 more frequent cramps.   Cramps run down both legs and into the toes.). Negative for falls.       She is chronically in pain.  Neurological: Positive for dizziness (much improved & not associated wiht palpitation. ) and tingling. Negative for focal weakness, weakness and headaches.  Psychiatric/Behavioral: Positive for depression. Negative for memory loss. The patient is nervous/anxious. The patient does not have insomnia.   All other systems reviewed and are negative.  The patient does not have symptoms concerning for COVID-19 infection (fever, chills, cough, or new shortness of breath).  The patient is practicing social distancing.  COVID-19 Education: The signs and symptoms of COVID-19 were discussed with the patient and how to seek care for testing (follow up with PCP or arrange E-visit).   The importance of social distancing was discussed today.  I have reviewed and (if needed) personally updated the patient's problem list, medications, allergies, past medical and surgical history, social and family history.   Past Medical History:  Diagnosis Date  . Anxiety   . Arthritis   . DES exposure in utero   . Diverticulosis of colon   . Dizziness   . Environmental and seasonal allergies   . GERD (gastroesophageal reflux disease)   . Hashimoto's thyroiditis   . Heart palpitations   . Hiatal hernia   . History of adenomatous polyp of colon    hyperplastic 2015  and tubular adenoma 2005  . History of esophageal dilatation    of stricture  . History of esophagitis    LA class , grade B and Gastritis  . History of pituitary tumor    dx 1990's - asymptomatic /  per pt last scan 2005 approx.  resolved   . Hyperlipidemia   . Hypothyroidism   . Leg pain, bilateral   . Mild intermittent asthma   . Moderate mitral regurgitation by prior echocardiography 2012   1-2 MR on Echo  . MTHFR mutation ([place])   . MVP (mitral valve prolapse)    Mild calcified posterior leaflet prolapse on echo July 2018.  Mild MR.  . OSA  (obstructive sleep apnea)    non-compliant cpap  . Prediabetes   . Sleep apnea    does not wear cpap  . SUI (stress urinary incontinence, female)   . VIN III (vulvar intraepithelial neoplasia III)   . Wears glasses     Past Surgical History:  Procedure Laterality Date  . CARDIAC EVENT MONITOR  July-August 2018   Normal sinus rhythm with sinus tachycardia.  Average heart rate 91 bpm.  Occasional PVCs.  One 12 and another 6 beat run of wide-complex tachycardia.  2 morphology PVCs.PVCs noted, but not during longer episode of WCT.  Marland Kitchen CESAREAN SECTION  1994  . CO2 LASER APPLICATION N/A 0/[id number]/7619   Procedure: CO2 LASER APPLICATION ;  Surgeon: Everitt Amber, MD;  Location: [facility];  Service: Gynecology;  Laterality: N/A;  . COLONOSCOPY, ESOPHAGOGASTRODUODENOSCOPY (EGD) AND ESOPHAGEAL DILATION  10-04-2013  . LAPAROSCOPIC CHOLECYSTECTOMY  1995  . LASER ABLATION CONDOLAMATA N/A 07/03/2015   Procedure: CO2 LASER OF VULVA ;  Surgeon: Everitt Amber, MD;  Location: [facility];  Service: Gynecology;  Laterality: N/A;  . NM MYOVIEW LTD  10/2016   LOW RISK.  No ischemia or infarction.  EF 54%.  . TRANSTHORACIC ECHOCARDIOGRAM  11/2010   Normal LV Size & Fxn - EF 55-60%. Mild-Mod MR. Normal DF.  Marland Kitchen TRANSTHORACIC ECHOCARDIOGRAM  08/2016   EF 60 of 65%.  Severe LVH.  Pseudo-normal filling (grade 2 diastolic dysfunction).  MAC with calcified mobile attachments.  Posterior leaflet prolapse.  Mild regurgitation.  Moderate LA dilation.    Current Meds  Medication Sig  . ALBUTEROL IN Inhale into the lungs as needed.  Marland Kitchen aspirin EC 81 MG tablet Take 81 mg by mouth daily. Reported on 07/16/2015  . atorvastatin (LIPITOR) 40 MG tablet Take 20 mg by mouth every evening.   Marland Kitchen azelastine (ASTELIN) 0.1 % nasal spray Place 2 sprays into both nostrils daily.  . fluticasone (FLONASE) 50 MCG/ACT nasal spray Place 2 sprays into both nostrils every morning.  . hydroxychloroquine (PLAQUENIL) 200 MG  tablet   . levothyroxine (SYNTHROID, LEVOTHROID) 137 MCG tablet Take 137 mcg by mouth daily before breakfast.  . liothyronine (CYTOMEL) 5 MCG tablet Take 5 mcg by mouth daily.  . metoprolol tartrate (LOPRESSOR) 25 MG tablet Take 0.5 tablets (12.5 mg total) by mouth every morning AND 1 tablet (25 mg total) every evening. May take extra dose if needed.  . mupirocin ointment (BACTROBAN) 2 % Apply 1 application topically 2 (two) times daily.  . nicotine polacrilex (NICORETTE) 2 MG gum Take 2 mg by mouth as needed for smoking cessation.  Marland Kitchen omeprazole (PRILOSEC) 40 MG capsule Take 1 capsule (40 mg total) by mouth 2 (two) times daily.  . [DISCONTINUED] busPIRone (BUSPAR) 15 MG tablet Take 15 mg by mouth 3 (three) times daily as needed.   . [DISCONTINUED] metoprolol tartrate (LOPRESSOR) 25 MG tablet Take 0.5 tablets (12.5 mg total) by mouth every morning AND 1 tablet (25 mg total) every evening. OVERDUE FOR FOLLOW UP.   Current Facility-Administered Medications for the 11/08/18 encounter (Office Visit) with Leonie Man, MD  Medication  . 0.9 %  sodium chloride infusion    Allergies  Allergen Reactions  . Iodine Anaphylaxis  . Iohexol Anaphylaxis     Desc: Patient states she is allergic to iodine and had a "code blue" incident after an injection for some type of imaging study.   . Penicillins Rash    PCN IN LARGE DOSES.    Social History   Tobacco Use  . Smoking status: Former Smoker    Packs/day: 1.00    Years: 48.00    Pack years: 48.00    Types: Cigarettes    Quit date: 05/10/2014    Years since quitting: 4.5  . Smokeless tobacco: Never Used  . Tobacco comment: using nicorrette  Substance Use Topics  . Alcohol use: Yes    Alcohol/week: 2.0 standard drinks    Types: 2 Standard drinks or equivalent per week    Comment: Social - > is now essentially quit  . Drug use: No   Social History   Social History Narrative   No routine exercise. Describes herself as "lazy "      family history includes COPD in her brother; Colon cancer in her father and maternal grandfather; Dementia in her brother; Heart attack in her brother and mother; Heart disease in her mother; Pancreatic cancer in her father.  Wt Readings from Last 3 Encounters:  11/08/18 227 lb 9.6 oz (103.2  kg)  09/06/17 250 lb (113.4 kg)  08/10/17 250 lb 12.8 oz (113.8 kg)    PHYSICAL EXAM BP 113/75   Pulse 81   Temp (!) 96.8 F (36 C)   Ht _0  (1.727 m)   Wt 227 lb 9.6 oz (103.2 kg)   SpO2 97%   BMI 34.61 kg/m  Physical Exam  Constitutional: She is oriented to person, place, and time. She appears well-developed and well-nourished. No distress.  Moderate-morbidly obese; seems just a little bit upset about the current situation and medications.  HENT:  Head: Normocephalic and atraumatic.  Neck: Neck supple. No hepatojugular reflux and no JVD present. Carotid bruit is not present.  Cardiovascular: Normal rate, regular rhythm, S1 normal, S2 normal and normal pulses.  Occasional extrasystoles are present. Exam reveals distant heart sounds. Exam reveals no gallop and no friction rub.  Murmur heard. High-pitched harsh crescendo-decrescendo midsystolic murmur is present with a grade of 1/6 at the upper right sternal border.  Blowing plateau holosystolic murmur of grade 2/6 is also present. Unable to palpate PMI  Pulmonary/Chest: Effort normal and breath sounds normal. No respiratory distress. She has no rales.  Distant breath sounds  Abdominal: Soft. Bowel sounds are normal. She exhibits no distension. There is no abdominal tenderness.  Obese  Musculoskeletal: Normal range of motion.        General: Edema (Trivial-1+) present.  Neurological: She is alert and oriented to person, place, and time.  Skin: Skin is dry.  Psychiatric: She has a normal mood and affect. Her behavior is normal. Judgment and thought content normal.  Nursing note and vitals reviewed.  Adult ECG Report n/a  Other studies  Reviewed: Additional studies/ records that were reviewed today include:  Recent Labs:  No results found for: CHOL, HDL, LDLCALC, LDLDIRECT, TRIG, CHOLHDL Lab Results  Component Value Date   CREATININE 0.77 01/03/2017   BUN 12 01/03/2017   NA 142 01/03/2017   K 4.8 01/03/2017   CL 104 01/03/2017   CO2 22 01/03/2017   ASSESSMENT / PLAN: Problem List Items Addressed This Visit    NSVT (nonsustained ventricular tachycardia) (HCC) (Chronic)    Likely benign short episodes.  Normal echocardiogram and Myoview.  Continue to monitor.  I do not think that is what she is feeling right now.      Relevant Medications   metoprolol tartrate (LOPRESSOR) 25 MG tablet   Other Relevant Orders   EKG 12-Lead (Completed)   PVC's (premature ventricular contractions) (Chronic)    Control until she had her issues with medication change.  At this point I think continued static dosing of metoprolol will be beneficial.  She can take the additional half dose if necessary for worsening palpitations, but she indicated that she has not had to do that so far. I will start her on some vagal maneuvers if the symptoms are prolonged.  Continue to avoid triggers      Relevant Medications   metoprolol tartrate (LOPRESSOR) 25 MG tablet   Other Relevant Orders   EKG 12-Lead (Completed)   Palpitations - Primary (Chronic)    Not unexpectedly, she had some adverse reactions to potential withdrawal from Effexor.  Hopefully these will resolve as her withdrawal symptoms improved.      Exertional dyspnea    Probably combination of obesity, OSA and possible obese ambulation as well as deconditioning.  Has had a negative ischemic evaluation, is euvolemic, cardiac standpoint      Relevant Orders   EKG 12-Lead (Completed)  Current medicines are reviewed at length with the patient today. (+/- concerns) none The following changes have been made:None  Patient Instructions  Medication Instructions:   continue   Medication  If you need a refill on your cardiac medications before your next appointment, please call your pharmacy.   Lab work: Not needed   Testing/Procedures: Not needed  Follow-Up: At [facility], you and your health needs are our priority.  As part of our continuing mission to provide you with exceptional heart care, we have created designated Provider Care Teams.  These Care Teams include your primary Cardiologist (physician) and Advanced Practice Providers (APPs -  Physician Assistants and Nurse Practitioners) who all work together to provide you with the care you need, when you need it. . You will need a follow up appointment in  12  months.  Please call our office 2 months in advance to schedule this appointment.  You may see Glenetta Hew, MD or one of the following Advanced Practice Providers on your designated Care Team:   . Rosaria Ferries, PA-C . Jory Sims, DNP, ANP  Any Other Special Instructions Will Be Listed Below (If Applicable).   Studies Ordered:   Orders Placed This Encounter  Procedures  . EKG 12-Lead      Glenetta Hew, M.D., M.S. Interventional Cardiologist   Pager # [phone] Phone # [phone] [address] [place], [place] [address]

## 2018-11-08 NOTE — Patient Instructions (Addendum)
Medication Instructions:   continue  Medication  If you need a refill on your cardiac medications before your next appointment, please call your pharmacy.   Lab work: Not needed   Testing/Procedures: Not needed  Follow-Up: At [facility], you and your health needs are our priority.  As part of our continuing mission to provide you with exceptional heart care, we have created designated Provider Care Teams.  These Care Teams include your primary Cardiologist (physician) and Advanced Practice Providers (APPs -  Physician Assistants and Nurse Practitioners) who all work together to provide you with the care you need, when you need it. . You will need a follow up appointment in  12  months.  Please call our office 2 months in advance to schedule this appointment.  You may see Glenetta Hew, MD or one of the following Advanced Practice Providers on your designated Care Team:   . Rosaria Ferries, PA-C . Jory Sims, DNP, ANP  Any Other Special Instructions Will Be Listed Below (If Applicable).

## 2018-11-12 ENCOUNTER — Encounter: Payer: Self-pay | Admitting: Cardiology

## 2018-11-12 NOTE — Assessment & Plan Note (Addendum)
Control until she had her issues with medication change.  At this point I think continued static dosing of metoprolol will be beneficial.  She can take the additional half dose if necessary for worsening palpitations, but she indicated that she has not had to do that so far. I will start her on some vagal maneuvers if the symptoms are prolonged.  Continue to avoid triggers

## 2018-11-12 NOTE — Assessment & Plan Note (Signed)
Not unexpectedly, she had some adverse reactions to potential withdrawal from Effexor.  Hopefully these will resolve as her withdrawal symptoms improved.

## 2018-11-12 NOTE — Assessment & Plan Note (Signed)
Probably combination of obesity, OSA and possible obese ambulation as well as deconditioning.  Has had a negative ischemic evaluation, is euvolemic, cardiac standpoint

## 2018-11-12 NOTE — Assessment & Plan Note (Signed)
Likely benign short episodes.  Normal echocardiogram and Myoview.  Continue to monitor.  I do not think that is what she is feeling right now.

## 2018-11-15 DIAGNOSIS — D224 Melanocytic nevi of scalp and neck: Secondary | ICD-10-CM | POA: Diagnosis not present

## 2018-11-15 DIAGNOSIS — L309 Dermatitis, unspecified: Secondary | ICD-10-CM | POA: Diagnosis not present

## 2018-11-15 DIAGNOSIS — R151 Fecal smearing: Secondary | ICD-10-CM | POA: Diagnosis not present

## 2018-11-15 DIAGNOSIS — N39498 Other specified urinary incontinence: Secondary | ICD-10-CM | POA: Diagnosis not present

## 2018-11-15 DIAGNOSIS — L821 Other seborrheic keratosis: Secondary | ICD-10-CM | POA: Diagnosis not present

## 2018-11-15 DIAGNOSIS — D1801 Hemangioma of skin and subcutaneous tissue: Secondary | ICD-10-CM | POA: Diagnosis not present

## 2018-11-15 DIAGNOSIS — M6281 Muscle weakness (generalized): Secondary | ICD-10-CM | POA: Diagnosis not present

## 2018-11-15 DIAGNOSIS — L570 Actinic keratosis: Secondary | ICD-10-CM | POA: Diagnosis not present

## 2018-11-22 DIAGNOSIS — R151 Fecal smearing: Secondary | ICD-10-CM | POA: Diagnosis not present

## 2018-11-22 DIAGNOSIS — M6281 Muscle weakness (generalized): Secondary | ICD-10-CM | POA: Diagnosis not present

## 2018-11-22 DIAGNOSIS — N39498 Other specified urinary incontinence: Secondary | ICD-10-CM | POA: Diagnosis not present

## 2018-12-06 ENCOUNTER — Other Ambulatory Visit: Payer: Self-pay | Admitting: Cardiology

## 2018-12-06 MED ORDER — METOPROLOL TARTRATE 25 MG PO TABS: ORAL_TABLET | ORAL | 3 refills | Status: DC

## 2018-12-06 NOTE — Telephone Encounter (Signed)
Requested Prescriptions   Signed Prescriptions Disp Refills  . metoprolol tartrate (LOPRESSOR) 25 MG tablet 160 tablet 3    Sig: Take 0.5 tablets (12.5 mg total) by mouth every morning AND 1 tablet (25 mg total) every evening. May take extra dose if needed.    Authorizing Provider: Leonie Man    Ordering User: Raelene Bott, Quanita Barona L

## 2018-12-06 NOTE — Telephone Encounter (Signed)
°*  STAT* If patient is at the pharmacy, call can be transferred to refill team.   1. Which medications need to be refilled? (please list name of each medication and dose if known)   metoprolol tartrate (LOPRESSOR) 25 MG tablet  2. Which pharmacy/location (including street and city if local pharmacy) is medication to be sent to?  [facility], [place] AT [place]  3. Do they need a 30 day or 90 day supply? 90   Patient [facility] one she got from [facility], so she returned the medication to [facility]. She said that [facility] lost her new RX and she will not be able to get her meds from [facility] without a new rx.   She would basically like to start the process all over again at [facility], where she knows things will be done correctly  Patient will be out of medication by Friday

## 2019-01-15 DIAGNOSIS — R05 Cough: Secondary | ICD-10-CM | POA: Diagnosis not present

## 2019-01-15 DIAGNOSIS — J40 Bronchitis, not specified as acute or chronic: Secondary | ICD-10-CM | POA: Diagnosis not present

## 2019-01-16 DIAGNOSIS — J40 Bronchitis, not specified as acute or chronic: Secondary | ICD-10-CM | POA: Diagnosis not present

## 2019-02-04 ENCOUNTER — Other Ambulatory Visit: Payer: Self-pay | Admitting: Gastroenterology

## 2019-02-08 DIAGNOSIS — J40 Bronchitis, not specified as acute or chronic: Secondary | ICD-10-CM | POA: Diagnosis not present

## 2019-02-08 DIAGNOSIS — J069 Acute upper respiratory infection, unspecified: Secondary | ICD-10-CM | POA: Diagnosis not present

## 2019-02-09 DIAGNOSIS — Z8679 Personal history of other diseases of the circulatory system: Secondary | ICD-10-CM

## 2019-02-09 HISTORY — DX: Personal history of other diseases of the circulatory system: Z86.79

## 2019-02-12 ENCOUNTER — Telehealth: Payer: Self-pay | Admitting: Cardiology

## 2019-02-12 NOTE — Telephone Encounter (Signed)
At this point I think we can probably see how she is doing after her round of antibiotics to complete.  It is quite likely that her symptoms are related to that, but cannot exclude a cardiac reason.  Lets see what the chest x-ray and other labs look like that her PCP ordered.  Hard to assess unless she is actually seen here.  May want to consider some type of other evaluation like an echo or event monitor depending on what her symptoms are.    Maybe we can try to get her in to be seen by end of the week or early next week with either myself or APP.  Glenetta Hew, MD

## 2019-02-12 NOTE — Telephone Encounter (Signed)
Spoke with pt for a long time and main complaint is SOB since beginning of December Pt is on second round of antibiotic therapy  and can barely walk to the bathroom without getting extremely SOB and heart racing Pt also notes eye and ankle swelling Pt is being treated for sinus infection and has tested negative for covid Per pt woke up several times last night SOB Pt had virtual visit with PCP and was told to have CXR and should have some labs as well Per pt will follow up with this .Will forward to Dr Ellyn Hack for review .and recommendations /cy

## 2019-02-12 NOTE — Telephone Encounter (Signed)
Lm to call back ./cy 

## 2019-02-12 NOTE — Telephone Encounter (Signed)
New Message     Pt says she  thought she has had a sinus infection, but she googled her symptoms and thinks she is in congestive heart failure  She says she gets SOB very easily, she says when she wakes up in the middle of the night, her Heart beats really fast and feels like she is having a panic attack  Pt says she has checked her BP and Hr and they seem to be okay, ( did not give any readings)  she says she has not checked it during an episode  Pt says he PC put her on an antibiotic since 01/01 and she doesn't feel like its working She says her eyes are swollen and the left side of her face and some swelling in ankles    Please call

## 2019-02-13 NOTE — Telephone Encounter (Signed)
Lm to call back ./cy 

## 2019-02-13 NOTE — Telephone Encounter (Signed)
PT AWARE OF RECOMMENDATIONS AND WILL CALL END OF WEEK WITH UPDATE .Adonis Housekeeper

## 2019-02-13 NOTE — Telephone Encounter (Signed)
Patient returning call.

## 2019-02-15 DIAGNOSIS — R760 Raised antibody titer: Secondary | ICD-10-CM | POA: Diagnosis not present

## 2019-02-15 DIAGNOSIS — M359 Systemic involvement of connective tissue, unspecified: Secondary | ICD-10-CM | POA: Diagnosis not present

## 2019-02-15 DIAGNOSIS — Z79899 Other long term (current) drug therapy: Secondary | ICD-10-CM | POA: Diagnosis not present

## 2019-02-19 DIAGNOSIS — J069 Acute upper respiratory infection, unspecified: Secondary | ICD-10-CM | POA: Diagnosis not present

## 2019-02-19 DIAGNOSIS — J40 Bronchitis, not specified as acute or chronic: Secondary | ICD-10-CM | POA: Diagnosis not present

## 2019-02-22 ENCOUNTER — Other Ambulatory Visit: Payer: Self-pay | Admitting: Physician Assistant

## 2019-02-22 DIAGNOSIS — Z139 Encounter for screening, unspecified: Secondary | ICD-10-CM | POA: Diagnosis not present

## 2019-02-22 DIAGNOSIS — I517 Cardiomegaly: Secondary | ICD-10-CM | POA: Diagnosis not present

## 2019-02-22 DIAGNOSIS — R0602 Shortness of breath: Secondary | ICD-10-CM

## 2019-02-23 DIAGNOSIS — J90 Pleural effusion, not elsewhere classified: Secondary | ICD-10-CM | POA: Diagnosis not present

## 2019-02-23 DIAGNOSIS — I517 Cardiomegaly: Secondary | ICD-10-CM | POA: Diagnosis not present

## 2019-02-23 DIAGNOSIS — R0602 Shortness of breath: Secondary | ICD-10-CM | POA: Diagnosis not present

## 2019-02-23 DIAGNOSIS — J811 Chronic pulmonary edema: Secondary | ICD-10-CM | POA: Diagnosis not present

## 2019-02-27 ENCOUNTER — Other Ambulatory Visit: Payer: Self-pay

## 2019-02-27 ENCOUNTER — Encounter: Payer: Self-pay | Admitting: Cardiology

## 2019-02-27 ENCOUNTER — Ambulatory Visit (INDEPENDENT_AMBULATORY_CARE_PROVIDER_SITE_OTHER): Payer: Medicare Other | Admitting: Cardiology

## 2019-02-27 VITALS — BP 115/72 | HR 80 | Temp 96.8°F | Ht 68.0 in | Wt 235.0 lb

## 2019-02-27 DIAGNOSIS — I340 Nonrheumatic mitral (valve) insufficiency: Secondary | ICD-10-CM | POA: Diagnosis not present

## 2019-02-27 DIAGNOSIS — I472 Ventricular tachycardia: Secondary | ICD-10-CM | POA: Diagnosis not present

## 2019-02-27 DIAGNOSIS — R0609 Other forms of dyspnea: Secondary | ICD-10-CM

## 2019-02-27 DIAGNOSIS — I5189 Other ill-defined heart diseases: Secondary | ICD-10-CM | POA: Diagnosis not present

## 2019-02-27 DIAGNOSIS — I4729 Other ventricular tachycardia: Secondary | ICD-10-CM

## 2019-02-27 DIAGNOSIS — I493 Ventricular premature depolarization: Secondary | ICD-10-CM

## 2019-02-27 DIAGNOSIS — R0600 Dyspnea, unspecified: Secondary | ICD-10-CM | POA: Diagnosis not present

## 2019-02-27 DIAGNOSIS — I2510 Atherosclerotic heart disease of native coronary artery without angina pectoris: Secondary | ICD-10-CM | POA: Insufficient documentation

## 2019-02-27 DIAGNOSIS — R079 Chest pain, unspecified: Secondary | ICD-10-CM | POA: Diagnosis not present

## 2019-02-27 DIAGNOSIS — I2584 Coronary atherosclerosis due to calcified coronary lesion: Secondary | ICD-10-CM

## 2019-02-27 MED ORDER — METOPROLOL TARTRATE 25 MG PO TABS: ORAL_TABLET | ORAL | 2 refills | Status: DC

## 2019-02-27 NOTE — Assessment & Plan Note (Signed)
Again tired but avoiding triggers. Mostly symptoms are worse at the end.  Plan: Increase p.m. dose of beta-blocker to 37.5 mg.

## 2019-02-27 NOTE — Assessment & Plan Note (Signed)
Has had short benign episodes in the past previously evaluated with relative normal echo and Myoview.  I doubt that the episode she was feeling completely months ago were nonsustained V. tach, but could have been perhaps PAT or PSVT.  Most these episodes seem to be in the evening. Plan: Continue with half tablet 25 mg (dose of 12.5 mg) metoprolol the morning but will increase p.m. dose to 1-1/2 tablets (dose of 37.5 mg).

## 2019-02-27 NOTE — Assessment & Plan Note (Addendum)
Coronary calcification noted on chest CT of LAD calcification.  I would have considered doing coronary CT angiogram with chronic calcium score to evaluate her chest comfort, however she has had a history of contrast hypersensitivity with cardiac arrest.  As such, would hold off on contrast unless necessary for heart catheterization.  Plan: We will check Lexiscan Myoview.  (Back pain would preclude walking)  Blood pressures well controlled on low-dose beta-blocker.  On atorvastatin.  Lipids borderline control for existing disease.  May want to be more aggressive pending stress test results.

## 2019-02-27 NOTE — Assessment & Plan Note (Signed)
Difficult however chest pain is, she definitely had discomfort with tachycardia spells and is now having some atypical sounding symptoms that are somewhat concerning for angina and that is recurrence with exertional dyspnea, but also occurrence without exertion.  Need to exclude ischemia.   Plan: [employer]

## 2019-02-27 NOTE — Assessment & Plan Note (Addendum)
Most recent echo did not show significant MR.  Does not have a lot of MR on exam although difficult to assess.  With her having exertional dyspnea, would like to exclude worsening mitral valve prolapse and regurgitation.  Can also assess EF.  Plan: Check echocardiogram.

## 2019-02-27 NOTE — Assessment & Plan Note (Signed)
Seems euvolemic on exam, however she is noting exertional dyspnea.  I would like to exclude either ischemic CAD versus to simply worsening diastolic dysfunction. Plan: Check 2D echo and Myoview

## 2019-02-27 NOTE — Progress Notes (Signed)
PCP: Medicine, [name], Dr. Juleen China Rheumatologist: Dr. Danne Harbor, also from [facility] Note: Chief Complaint  Patient presents with  . Follow-up  . Palpitations  . Shortness of Breath    With exertion  . Chest Pain   HPI:Rebecca Carr is a 69 y.o. female initially seen for palpitations and heart murmur-noted to have brief bursts of NSVT on monitor evaluated with negative Myoview.  She now presents for 3-37-monthfollow-up.  She had reduced her caffeine and chocolate intake, and noted that her palpitations notably improved.   --> She had reduced to 12.5 mg (1/2) tablet of metoprolol in the morning and a full tablet in the evening of 25 mg -reduced due to some fatigue.. --> Back in July 2019 she was being evaluated for arthritis pains and was noted to have positive ANA and was given diagnosis of Hashimoto's thyroiditis and possible lupus/Sjogren's) - Rheumatologist mentioned that she may have a clotting disorder. (Dr. MLollie Sails- [facility])  IDorothey BasemanEKarrenwas last seen in September 2020 for worsening palpitations.  She felt like she was going through withdrawal symptoms from having Effexor stopped and started on Prozac.  She had nausea, vomiting, nightmares as well as palpitations. Otherwise no significant symptoms other than palpitations and exertional dyspnea..  Recent Hospitalizations: none  Studies Personally Reviewed - (if available, images/films reviewed: From Epic Chart or Care Everywhere)  Chest CT - Cardiomegaly. LAD Calcification.  Interval History:  Mrs. Easily returns today noting that she really has not had a good last few months.  She said about 2 months ago she was doing a lot of social stressors (her ex-husband was doing a lot of health issues that cause a lot of stress for her).  During that time she had about a 3-4 night spell where she is having frequent tachycardia episodes at wake her up at night.  Whenever she go  back to sleep or lay back down again the heart rate will go back up again.  This would be associated with tightness in her chest and dyspnea as well as dizziness if she was standing up.  She said she has not had any of those in the last month.  Maybe 1 or 2 episodes during the day about 6 weeks ago but none recently. But she is noted though is really significant exertional dyspnea and chest discomfort.  She gets some chest discomfort when she bends down and sit back up again, but also sometimes with exertion.  It is more shortness of breath with exertion and exercise intolerance than than anything else.  Cardiovascular ROS: positive for - chest pain, dyspnea on exertion, orthopnea, palpitations, paroxysmal nocturnal dyspnea, rapid heart rate and ~ 15 episodes of Rapid palpitations over spell of ~2-3 days (mostly @ night) negative for - edema, shortness of breath or syncope/near syncope; TIA / amaurosis fugax   ROS: A comprehensive was performed. Review of Systems  Constitutional: Positive for malaise/fatigue (Seems like his chronic). Negative for chills, fever and weight loss (put back on weight after changing SSRI).  HENT: Positive for congestion (with chronic drainage.). Negative for hearing loss and nosebleeds.   Eyes:       Mutiple eye infections  Respiratory: Positive for shortness of breath (If she overexerts) and wheezing. Negative for cough.   Cardiovascular: Positive for palpitations (per HPI). Negative for claudication.       Per HPI  Gastrointestinal: Negative for abdominal pain (cramping in the upper abdomen with  tachycardia spells), blood in stool and melena.  Genitourinary: Negative for dysuria and hematuria.  Musculoskeletal: Positive for back pain, joint pain and myalgias (6 more frequent cramps.  Cramps run down both legs and into the toes.). Negative for falls.       She is chronically in pain.  Neurological: Positive for dizziness (much improved & not associated wiht  palpitation. ) and tingling. Negative for focal weakness, weakness and headaches.       Loss of sensation - numbness R foot  Psychiatric/Behavioral: Positive for depression. Negative for memory loss. The patient is nervous/anxious. The patient does not have insomnia.        Has been doing a lot of social stress  All other systems reviewed and are negative.  The patient is practicing social distancing.  COVID-19 Education: The signs and symptoms of COVID-19 were discussed with the patient and how to seek care for testing (follow up with PCP or arrange E-visit).   The importance of social distancing was discussed today.  I have reviewed and (if needed) personally updated the patient's problem list, medications, allergies, past medical and surgical history, social and family history.   Past Medical History:  Diagnosis Date  . Anxiety   . Arthritis   . DES exposure in utero   . Diverticulosis of colon   . Dizziness   . Environmental and seasonal allergies   . GERD (gastroesophageal reflux disease)   . Hashimoto's thyroiditis   . Heart palpitations   . Hiatal hernia   . History of adenomatous polyp of colon    hyperplastic 2015  and tubular adenoma 2005  . History of esophageal dilatation    of stricture  . History of esophagitis    LA class , grade B and Gastritis  . History of pituitary tumor    dx 1990's - asymptomatic /  per pt last scan 2005 approx.  resolved   . Hyperlipidemia   . Hypothyroidism   . Leg pain, bilateral   . Mild intermittent asthma   . Moderate mitral regurgitation by prior echocardiography 2012   1-2 MR on Echo  . MTHFR mutation ([place])   . MVP (mitral valve prolapse)    Mild calcified posterior leaflet prolapse on echo July 2018.  Mild MR.  . OSA (obstructive sleep apnea)    non-compliant cpap  . Prediabetes   . Sleep apnea    does not wear cpap  . SUI (stress urinary incontinence, female)   . VIN III (vulvar intraepithelial neoplasia III)   . Wears  glasses     Past Surgical History:  Procedure Laterality Date  . CARDIAC EVENT MONITOR  July-August 2018   Normal sinus rhythm with sinus tachycardia.  Average heart rate 91 bpm.  Occasional PVCs.  One 12 and another 6 beat run of wide-complex tachycardia.  2 morphology PVCs.PVCs noted, but not during longer episode of WCT.  Marland Kitchen CESAREAN SECTION  1994  . CO2 LASER APPLICATION N/A 08/04/348   Procedure: CO2 LASER APPLICATION ;  Surgeon: Everitt Amber, MD;  Location[facility];  Service: Gynecology;  Laterality: N/A;  . COLONOSCOPY, ESOPHAGOGASTRODUODENOSCOPY (EGD) AND ESOPHAGEAL DILATION  10-04-2013  . LAPAROSCOPIC CHOLECYSTECTOMY  1995  . LASER ABLATION CONDOLAMATA N/A 07/03/2015   Procedure: CO2 LASER OF VULVA ;  Surgeon: Everitt Amber, MD;  Location: [facility];  Service: Gynecology;  Laterality: N/A;  . NM MYOVIEW LTD  10/2016   LOW RISK.  No ischemia or infarction.  EF 54%.  . TRANSTHORACIC ECHOCARDIOGRAM  11/2010   Normal LV Size & Fxn - EF 55-60%. Mild-Mod MR. Normal DF.  Marland Kitchen TRANSTHORACIC ECHOCARDIOGRAM  08/2016   EF 60 of 65%.  Severe LVH.  Pseudo-normal filling (grade 2 diastolic dysfunction).  MAC with calcified mobile attachments.  Posterior leaflet prolapse.  Mild regurgitation.  Moderate LA dilation.    Current Meds  Medication Sig  . Fluticasone-Salmeterol (ADVAIR) 100-50 MCG/DOSE AEPB Inhale 1 puff into the lungs 2 (two) times daily.   Current Facility-Administered Medications for the 02/27/19 encounter (Office Visit) with Leonie Man, MD  Medication  . 0.9 %  sodium chloride infusion    Allergies  Allergen Reactions  . Iodine Anaphylaxis  . Iohexol Anaphylaxis     Desc: Patient states she is allergic to iodine and had a "code blue" incident after an injection for some type of imaging study.   . Penicillins Rash    PCN IN LARGE DOSES.    Social History   Tobacco Use  . Smoking status: Former Smoker    Packs/day: 1.00    Years:  48.00    Pack years: 48.00    Types: Cigarettes    Quit date: 05/10/2014    Years since quitting: 4.8  . Smokeless tobacco: Never Used  . Tobacco comment: using nicorrette  Substance Use Topics  . Alcohol use: Yes    Alcohol/week: 2.0 standard drinks    Types: 2 Standard drinks or equivalent per week    Comment: Social - > is now essentially quit  . Drug use: No   Social History   Social History Narrative   No routine exercise. Describes herself as "lazy "     family history includes COPD in her brother; Colon cancer in her father and maternal grandfather; Dementia in her brother; Heart attack in her brother and mother; Heart disease in her mother; Pancreatic cancer in her father.  Wt Readings from Last 3 Encounters:  02/27/19 235 lb (106.6 kg)  11/08/18 227 lb 9.6 oz (103.2 kg)  09/06/17 250 lb (113.4 kg)    PHYSICAL EXAM BP 115/72   Pulse 80   Temp (!) 96.8 F (36 C)   Ht _0  (1.727 m)   Wt 235 lb (106.6 kg)   SpO2 97%   BMI 35.73 kg/m  Physical Exam  Constitutional: She is oriented to person, place, and time. She appears well-developed and well-nourished. No distress.  Moderately obese.  Well-groomed  HENT:  Head: Normocephalic and atraumatic.  Neck: No hepatojugular reflux and no JVD present. Carotid bruit is not present.  Cardiovascular: Normal rate, regular rhythm, S1 normal, S2 normal and normal pulses.  Occasional extrasystoles are present. PMI is not displaced. Exam reveals distant heart sounds. Exam reveals no gallop and no friction rub.  Murmur heard. High-pitched harsh crescendo-decrescendo midsystolic murmur is present with a grade of 2/6 at the upper right sternal border.  Blowing plateau holosystolic murmur of grade 2/6 is also present. Unable to palpate PMI  Pulmonary/Chest: Effort normal and breath sounds normal. No respiratory distress. She has no rales.  Distant breath sounds  Abdominal: Soft. Bowel sounds are normal. She exhibits no distension.  There is no abdominal tenderness.  Obese  Musculoskeletal:        General: Edema (Trivial-1+) present. Normal range of motion.     Cervical back: Normal range of motion and neck supple.  Neurological: She is alert and oriented to person, place, and time.  Skin: Skin is  warm and dry.  Psychiatric: She has a normal mood and affect. Her behavior is normal. Judgment and thought content normal.  Somewhat down mood  Nursing note and vitals reviewed.  Adult ECG Report NSR 80; R axis (91).  Otherwise normal EKG.  Other studies Reviewed: Additional studies/ records that were reviewed today include:  Recent Labs:   Lipid panel Jul 05, 2018: TC 159, TG 110, HDL 41 LDL 96.  Pertinent chemistries from October 26, 2018: BUN 12, Cr 0.94, K+ 4.8.  Normal LFTs  ASSESSMENT / PLAN: Problem List Items Addressed This Visit    NSVT (nonsustained ventricular tachycardia) ([place]) (Chronic)    Has had short benign episodes in the past previously evaluated with relative normal echo and Myoview.  I doubt that the episode she was feeling completely months ago were nonsustained V. tach, but could have been perhaps PAT or PSVT.  Most these episodes seem to be in the evening. Plan: Continue with half tablet 25 mg (dose of 12.5 mg) metoprolol the morning but will increase p.m. dose to 1-1/2 tablets (dose of 37.5 mg).      Relevant Medications   metoprolol tartrate (LOPRESSOR) 25 MG tablet   Moderate mitral regurgitation (Chronic)    Most recent echo did not show significant MR.  Does not have a lot of MR on exam although difficult to assess.  With her having exertional dyspnea, would like to exclude worsening mitral valve prolapse and regurgitation.  Can also assess EF.  Plan: Check echocardiogram.      Relevant Medications   metoprolol tartrate (LOPRESSOR) 25 MG tablet   PVC's (premature ventricular contractions) (Chronic)    Again tired but avoiding triggers. Mostly symptoms are worse at the  end.  Plan: Increase p.m. dose of beta-blocker to 37.5 mg.      Relevant Medications   metoprolol tartrate (LOPRESSOR) 25 MG tablet   Diastolic dysfunction without heart failure (Chronic)    Seems euvolemic on exam, however she is noting exertional dyspnea.  I would like to exclude either ischemic CAD versus to simply worsening diastolic dysfunction. Plan: Check 2D echo and Myoview      Exertional dyspnea    Exertional dyspnea with chest discomfort.  Cannot exclude an anginal equivalent.  She does have coronary calcium noted on CT scan.  With a contrast allergy, reluctant to do coronary CTA.  We will evaluate for ischemia with Myoview stress test.  Also evaluate with echocardiogram to assess EF, diastolic function and valvular disease.      Relevant Orders   EKG 12-Lead   ECHOCARDIOGRAM COMPLETE   MYOCARDIAL PERFUSION IMAGING   Chest pain of uncertain etiology    Difficult however chest pain is, she definitely had discomfort with tachycardia spells and is now having some atypical sounding symptoms that are somewhat concerning for angina and that is recurrence with exertional dyspnea, but also occurrence without exertion.  Need to exclude ischemia.   Plan: Lexiscan Myoview      Relevant Orders   EKG 12-Lead   MYOCARDIAL PERFUSION IMAGING   Coronary artery calcification of native artery - Primary    Coronary calcification noted on chest CT of LAD calcification.  I would have considered doing coronary CT angiogram with chronic calcium score to evaluate her chest comfort, however she has had a history of contrast hypersensitivity with cardiac arrest.  As such, would hold off on contrast unless necessary for heart catheterization.  Plan: We will check Lexiscan Myoview.  (Back pain would preclude walking)  Blood pressures well controlled on low-dose beta-blocker.  On atorvastatin.  Lipids borderline control for existing disease.  May want to be more aggressive pending stress test  results.      Relevant Medications   metoprolol tartrate (LOPRESSOR) 25 MG tablet      Current medicines are reviewed at length with the patient today. (+/- concerns) none The following changes have been made:None  Patient Instructions  Medication Instructions:  INCREASE the Metoprolol to 12.5 mg (half a tablet) in the morning and 37.5 mg (a tablet and a half) in the evening.   *If you need a refill on your cardiac medications before your next appointment, please call your pharmacy*  Lab Work: None ordered If you have labs (blood work) drawn today and your tests are completely normal, you will receive your results only by: Marland Kitchen MyChart Message (if you have MyChart) OR . A paper copy in the mail If you have any lab test that is abnormal or we need to change your treatment, we will call you to review the results.  Testing/Procedures: Your physician has requested that you have an echocardiogram. Echocardiography is a painless test that uses sound waves to create images of your heart. It provides your doctor with information about the size and shape of your heart and how well your heart's chambers and valves are working. You may receive an ultrasound enhancing agent through an IV if needed to better visualize your heart during the echo.This procedure takes approximately one hour. There are no restrictions for this procedure. This will take place at the [address].   Your physician has requested that you have a lexiscan myoview. For further information please visit [email or web address]. Please follow instruction sheet, as given.   How to prepare for your Myocardial Perfusion Test:  Do not eat or drink 3 hours prior to your test, except you may have water.  Do not consume products containing caffeine (regular or decaffeinated) 12 hours prior to your test. (ex: coffee, chocolate, sodas, tea).  Do bring a list of your current medications with you.  If not listed below, you  may take your medications as normal.  Do wear comfortable clothes (no dresses or overalls) and walking shoes, tennis shoes preferred (No heels or open toe shoes are allowed).  Do NOT wear cologne, perfume, aftershave, or lotions (deodorant is allowed).  The test will take approximately 3 to 4 hours to complete  If these instructions are not followed, your test will have to be rescheduled.    Follow-Up: At [facility], you and your health needs are our priority.  As part of our continuing mission to provide you with exceptional heart care, we have created designated Provider Care Teams.  These Care Teams include your primary Cardiologist (physician) and Advanced Practice Providers (APPs -  Physician Assistants and Nurse Practitioners) who all work together to provide you with the care you need, when you need it.  Your next appointment:   1 month(s)  The format for your next appointment:   In Person  Provider:   Glenetta Hew, MD      Studies Ordered:   Orders Placed This Encounter  Procedures  . MYOCARDIAL PERFUSION IMAGING  . EKG 12-Lead  . ECHOCARDIOGRAM COMPLETE      Glenetta Hew, M.D., M.S. Interventional Cardiologist   Pager # [phone] Phone # [phone] [address] [place], [place] [address]

## 2019-02-27 NOTE — Assessment & Plan Note (Signed)
Exertional dyspnea with chest discomfort.  Cannot exclude an anginal equivalent.  She does have coronary calcium noted on CT scan.  With a contrast allergy, reluctant to do coronary CTA.  We will evaluate for ischemia with Myoview stress test.  Also evaluate with echocardiogram to assess EF, diastolic function and valvular disease.

## 2019-02-27 NOTE — Patient Instructions (Addendum)
Medication Instructions:  INCREASE the Metoprolol to 12.5 mg (half a tablet) in the morning and 37.5 mg (a tablet and a half) in the evening.   *If you need a refill on your cardiac medications before your next appointment, please call your pharmacy*  Lab Work: None ordered If you have labs (blood work) drawn today and your tests are completely normal, you will receive your results only by: Marland Kitchen MyChart Message (if you have MyChart) OR . A paper copy in the mail If you have any lab test that is abnormal or we need to change your treatment, we will call you to review the results.  Testing/Procedures: Your physician has requested that you have an echocardiogram. Echocardiography is a painless test that uses sound waves to create images of your heart. It provides your doctor with information about the size and shape of your heart and how well your heart's chambers and valves are working. You may receive an ultrasound enhancing agent through an IV if needed to better visualize your heart during the echo.This procedure takes approximately one hour. There are no restrictions for this procedure. This will take place at the [address].   Your physician has requested that you have a lexiscan myoview. For further information please visit [email or web address]. Please follow instruction sheet, as given.   How to prepare for your Myocardial Perfusion Test:  Do not eat or drink 3 hours prior to your test, except you may have water.  Do not consume products containing caffeine (regular or decaffeinated) 12 hours prior to your test. (ex: coffee, chocolate, sodas, tea).  Do bring a list of your current medications with you.  If not listed below, you may take your medications as normal.  Do wear comfortable clothes (no dresses or overalls) and walking shoes, tennis shoes preferred (No heels or open toe shoes are allowed).  Do NOT wear cologne, perfume, aftershave, or lotions (deodorant is  allowed).  The test will take approximately 3 to 4 hours to complete  If these instructions are not followed, your test will have to be rescheduled.    Follow-Up: At [facility], you and your health needs are our priority.  As part of our continuing mission to provide you with exceptional heart care, we have created designated Provider Care Teams.  These Care Teams include your primary Cardiologist (physician) and Advanced Practice Providers (APPs -  Physician Assistants and Nurse Practitioners) who all work together to provide you with the care you need, when you need it.  Your next appointment:   1 month(s)  The format for your next appointment:   In Person  Provider:   Glenetta Hew, MD

## 2019-03-06 ENCOUNTER — Telehealth (HOSPITAL_COMMUNITY): Payer: Self-pay | Admitting: *Deleted

## 2019-03-06 NOTE — Telephone Encounter (Signed)
Attempted calling patient to go over instructions for stress test scheduled for 03/12/19 at 10:15.  Patient's voice mailbox was full and I could not leave a message so I sent an email instead with all instructions.  Patient was instructed to arrive no later than 8:15 for echocardiogram scheduled for 8:35 on this same day.

## 2019-03-07 DIAGNOSIS — E039 Hypothyroidism, unspecified: Secondary | ICD-10-CM | POA: Diagnosis not present

## 2019-03-07 DIAGNOSIS — R0602 Shortness of breath: Secondary | ICD-10-CM | POA: Diagnosis not present

## 2019-03-12 ENCOUNTER — Other Ambulatory Visit: Payer: Self-pay

## 2019-03-12 ENCOUNTER — Ambulatory Visit (HOSPITAL_BASED_OUTPATIENT_CLINIC_OR_DEPARTMENT_OTHER): Payer: Medicare Other

## 2019-03-12 ENCOUNTER — Ambulatory Visit (HOSPITAL_COMMUNITY): Payer: Medicare Other | Attending: Cardiology

## 2019-03-12 VITALS — Ht 68.0 in | Wt 235.0 lb

## 2019-03-12 DIAGNOSIS — R079 Chest pain, unspecified: Secondary | ICD-10-CM | POA: Insufficient documentation

## 2019-03-12 DIAGNOSIS — R9431 Abnormal electrocardiogram [ECG] [EKG]: Secondary | ICD-10-CM | POA: Insufficient documentation

## 2019-03-12 DIAGNOSIS — I2584 Coronary atherosclerosis due to calcified coronary lesion: Secondary | ICD-10-CM | POA: Insufficient documentation

## 2019-03-12 DIAGNOSIS — R0600 Dyspnea, unspecified: Secondary | ICD-10-CM | POA: Diagnosis not present

## 2019-03-12 DIAGNOSIS — R0609 Other forms of dyspnea: Secondary | ICD-10-CM

## 2019-03-12 DIAGNOSIS — I2510 Atherosclerotic heart disease of native coronary artery without angina pectoris: Secondary | ICD-10-CM

## 2019-03-12 HISTORY — PX: TRANSTHORACIC ECHOCARDIOGRAM: SHX275

## 2019-03-12 LAB — MYOCARDIAL PERFUSION IMAGING
LV dias vol: 153 mL (ref 46–106)
LV sys vol: 49 mL
Peak HR: 77 {beats}/min
Rest HR: 71 {beats}/min
SDS: 4
SRS: 0
SSS: 4
TID: 1.05

## 2019-03-12 MED ORDER — TECHNETIUM TC 99M TETROFOSMIN IV KIT
10.4000 | PACK | Freq: Once | INTRAVENOUS | Status: AC | PRN
  Administered 2019-03-12: 10.4 via INTRAVENOUS
  Filled 2019-03-12: qty 11

## 2019-03-12 MED ORDER — REGADENOSON 0.4 MG/5ML IV SOLN
0.4000 mg | Freq: Once | INTRAVENOUS | Status: AC
  Administered 2019-03-12: 12:00:00 0.4 mg via INTRAVENOUS

## 2019-03-12 MED ORDER — TECHNETIUM TC 99M TETROFOSMIN IV KIT
32.6000 | PACK | Freq: Once | INTRAVENOUS | Status: AC | PRN
  Administered 2019-03-12: 32.6 via INTRAVENOUS
  Filled 2019-03-12: qty 33

## 2019-03-15 ENCOUNTER — Telehealth: Payer: Self-pay | Admitting: Cardiology

## 2019-03-15 NOTE — Telephone Encounter (Signed)
Patient calling for her test results.

## 2019-03-28 ENCOUNTER — Encounter: Payer: Self-pay | Admitting: Cardiology

## 2019-03-28 ENCOUNTER — Ambulatory Visit (INDEPENDENT_AMBULATORY_CARE_PROVIDER_SITE_OTHER): Payer: Medicare Other | Admitting: Cardiology

## 2019-03-28 ENCOUNTER — Other Ambulatory Visit: Payer: Self-pay

## 2019-03-28 VITALS — BP 110/82 | HR 86 | Temp 98.0°F | Ht 68.0 in | Wt 244.0 lb

## 2019-03-28 DIAGNOSIS — I2584 Coronary atherosclerosis due to calcified coronary lesion: Secondary | ICD-10-CM | POA: Diagnosis not present

## 2019-03-28 DIAGNOSIS — I340 Nonrheumatic mitral (valve) insufficiency: Secondary | ICD-10-CM

## 2019-03-28 DIAGNOSIS — I5033 Acute on chronic diastolic (congestive) heart failure: Secondary | ICD-10-CM

## 2019-03-28 DIAGNOSIS — R002 Palpitations: Secondary | ICD-10-CM

## 2019-03-28 DIAGNOSIS — I341 Nonrheumatic mitral (valve) prolapse: Secondary | ICD-10-CM | POA: Diagnosis not present

## 2019-03-28 DIAGNOSIS — I493 Ventricular premature depolarization: Secondary | ICD-10-CM | POA: Diagnosis not present

## 2019-03-28 DIAGNOSIS — Z01818 Encounter for other preprocedural examination: Secondary | ICD-10-CM | POA: Diagnosis not present

## 2019-03-28 DIAGNOSIS — I2510 Atherosclerotic heart disease of native coronary artery without angina pectoris: Secondary | ICD-10-CM | POA: Diagnosis not present

## 2019-03-28 DIAGNOSIS — I472 Ventricular tachycardia: Secondary | ICD-10-CM | POA: Diagnosis not present

## 2019-03-28 DIAGNOSIS — I4729 Other ventricular tachycardia: Secondary | ICD-10-CM

## 2019-03-28 MED ORDER — METOPROLOL TARTRATE 25 MG PO TABS: ORAL_TABLET | ORAL | 3 refills | Status: DC

## 2019-03-28 MED ORDER — PREDNISONE 50 MG PO TABS: ORAL_TABLET | ORAL | 0 refills | Status: DC

## 2019-03-28 MED ORDER — FUROSEMIDE 40 MG PO TABS: ORAL_TABLET | ORAL | 6 refills | Status: DC

## 2019-03-28 NOTE — Assessment & Plan Note (Signed)
She is clearly having symptoms of heart failure, most likely related to her worsening much regurgitation and diastolic dysfunction. She does not have a lot of blood pressure room in order to add afterload reduction, and I am worried about potential syncope.  Also concerned about her tachycardia.  We are increasing her nighttime dose of beta-blocker.  We will add Lasix 40 mg for 2 to 3 days and then reduce to 20 mg if symptoms improve.  Otherwise we will continue to 40 mg daily.

## 2019-03-28 NOTE — Assessment & Plan Note (Signed)
She had a significant change in her murmur and now echocardiogram shows significant change in the intensity and severity of her mitral regurgitation.  There is also mitral valve prolapse noted.  Moderate to severe MR noted and her exam is clearly worse.  Thankfully EF has improved.  At this point I think with significant symptoms, we need to initiate evaluation for mitral valve repair.  Recommendation was TEE based on echo results, we will go and plan also for right and left heart catheterization as part of preop assessment.  Following her TEE and cardiac catheterization, she will be referred to the heart valve clinic with Dr. Burt Knack and Dr. Roxy Manns  Plan: We will add low-dose Lasix for diuresis,  Increase nighttime dose of beta-blocker, blood pressure not quite adequate enough to add afterload reduction.  Plan TEE to better assess valve-this will be followed by right left heart catheterization (scheduled for April 10, 2019)

## 2019-03-28 NOTE — Assessment & Plan Note (Signed)
I suspect that her tachycardia that she is grabbing is not nonsustained VT.  Sounds more consistent with sinus tachycardia exacerbated by her severe MR.  However we will continue with metoprolol she is taking half tablet in the morning, will increase to the full 2 tablets in the evening.

## 2019-03-28 NOTE — Progress Notes (Signed)
PCP: System, Pcp Not In, Dr. Juleen China Rheumatologist: Dr. Danne Harbor, also from [facility] Note: Chief Complaint  Patient presents with  . Follow-up    1 month post echo and myoview. -->  Moderate to severe MR  . Edema  . Shortness of Breath  . Headache  . Edema    Right side of her stomach.   HPI:Rebecca Carr is a 69 y.o. female initially seen for palpitations and heart murmur-now here for 1 month follow-up after Echocardiogram, and Myoview revealing moderate to severe much regurgitation with mitral prolapse.  She had reduced her caffeine and chocolate intake, and noted that her palpitations notably improved.   --> She had reduced to 12.5 mg (1/2) tablet of metoprolol in the morning and a full tablet in the evening of 25 mg -reduced due to some fatigue.. --> Back in July 2019 she was being evaluated for arthritis pains and was noted to have positive ANA and was given diagnosis of Hashimoto's thyroiditis and possible lupus/Sjogren's) -- suggestion is LUPUS > Sjogren's - Rheumatologist mentioned that she may have a clotting disorder. (Dr. Lollie Sails [facility])  She was seen in September 2020 for worsening palpitations.  She felt like she was going through withdrawal symptoms from having Effexor stopped and started on Prozac.  She had nausea, vomiting, nightmares as well as palpitations. Otherwise no significant symptoms other than palpitations and exertional dyspnea..  Rebecca Carr was last seen on 02/27/2019 -> noting that she had a several bad months.  Lots of social stress (revolving around health issues with her ex-husband).  Had had several spells lasting 3-4 nights of tachycardia milligrams a night.  Tachycardia associated with lying down, associated chest tightness and dizziness as well as dyspnea.  Significant DOE with some chest discomfort.  Some of the chest discomfort is related to certain movements.  Recent Hospitalizations: none  Studies  Personally Reviewed - (if available, images/films reviewed: From Epic Chart or Care Everywhere)  Chest CT - Cardiomegaly. LAD Calcification.  03/12/2019-TTE: EF 60 to 65%.  GRII DD-correlated with severe LA dilation..  Moderate RA dilation.  Moderate thickening in anterior MV leaflet.  Significant posterior leaflet prolapse with moderate to severe MR-eccentric direction.  Mild mitral stenosis.  Aortic valve sclerosis no stenosis.  Moderately elevated pulmonary pressures. --Recommend TEE for better assessment of MR.  03/11/2018 Myoview: EF 65-70%.  No ischemia or infarction.  LOW RISK.  Interval History:  Rebecca Carr returns here today having had a Myoview and echo done revealing significant mitral regurgitation.  She is profoundly dyspneic, wheezing and struggling to catch her breath simply walking back to the clinic exam room.  She is profoundly dyspneic with orthopnea and PND as well as now worsening edema.  She is at the point where she feels like something needs to be done.  She says that whenever she does any type of activity she feels her heart rate going up really fast and she gets so short of breath where she has to stop after 20 to 30 feet to catch her breath.  No associated chest pain or pressure.  She does not have any resting dyspnea or resting tachycardia symptoms.  She may feel some fluttering, but none of the tachycardia sensation. She is always had a little swelling but this is more than she is usually had and it is getting to the point of distention.  She is having to sleep pretty much sitting upright, but unfortunately has  not been using her CPAP because she was concerned about having "fluid on the lungs ".  Cardiovascular ROS: positive for - dyspnea on exertion, edema, orthopnea, paroxysmal nocturnal dyspnea, rapid heart rate and Now really only noting the tachycardia with, she says her heart rate will go from the mid 80s rate is at baseline up into the 140s 150s range simply walking  across the room. negative for - chest pain, irregular heartbeat, shortness of breath or No resting symptoms of shortness of breath.  No syncope/near syncope or TIA/amaurosis fugax.  She does get lightheaded when she is tachycardic but not syncope.   ROS: A comprehensive was performed. Review of Systems  Constitutional: Positive for malaise/fatigue (Chronic, but now worse than before). Negative for chills and weight loss (Weight is going up).  HENT: Positive for congestion (with chronic drainage.). Negative for hearing loss and nosebleeds.   Eyes:       Mutiple eye infections  Respiratory: Positive for cough (Now having dry hacking cough, worse with orthopnea), shortness of breath (If she overexerts) and wheezing.   Cardiovascular: Positive for palpitations (per HPI). Negative for claudication.       Per HPI  Gastrointestinal: Positive for heartburn. Negative for abdominal pain (cramping in the upper abdomen with tachycardia spells), blood in stool, melena and nausea.  Genitourinary: Negative for dysuria and hematuria.  Musculoskeletal: Positive for back pain, joint pain and myalgias (6 more frequent cramps.  Cramps run down both legs and into the toes.). Negative for falls.       Has chronic pain  Neurological: Positive for dizziness (much improved & not associated wiht palpitation. ) and tingling. Negative for focal weakness, weakness and headaches.       Loss of sensation - numbness R foot  Psychiatric/Behavioral: Positive for depression. Negative for memory loss. The patient is nervous/anxious (Very anxious when I start on my explanation about heart catheterization). The patient does not have insomnia.        Has been doing a lot of social stress  All other systems reviewed and are negative.   The patient does not have symptoms concerning for COVID-19 infection (fever, chills, cough, or new shortness of breath).  She is having the shortness of breath, but is clearly associated with  exertional dyspnea only. The patient is practicing social distancing.   COVID-19 Education: The signs and symptoms of COVID-19 were discussed with the patient and how to seek care for testing (follow up with PCP or arrange E-visit).   The importance of social distancing was discussed today.   I have reviewed and (if needed) personally updated the patient's problem list, medications, allergies, past medical and surgical history, social and family history.   Past Medical History:  Diagnosis Date  . Anxiety   . Arthritis   . DES exposure in utero   . Diverticulosis of colon   . Dizziness   . Environmental and seasonal allergies   . GERD (gastroesophageal reflux disease)   . Hashimoto's thyroiditis   . Heart palpitations   . Hiatal hernia   . History of adenomatous polyp of colon    hyperplastic 2015  and tubular adenoma 2005  . History of esophageal dilatation    of stricture  . History of esophagitis    LA class , grade B and Gastritis  . History of pituitary tumor    dx 1990's - asymptomatic /  per pt last scan 2005 approx.  resolved   . Hyperlipidemia   . Hypothyroidism   .  Leg pain, bilateral   . Mild intermittent asthma   . Moderate mitral regurgitation by prior echocardiography 2012   1-2 MR on Echo  . MTHFR mutation ([place])   . MVP (mitral valve prolapse)    Mild calcified posterior leaflet prolapse on echo July 2018.  Mild MR.  . OSA (obstructive sleep apnea)    non-compliant cpap  . Prediabetes   . SUI (stress urinary incontinence, female)   . VIN III (vulvar intraepithelial neoplasia III)   . Wears glasses     Past Surgical History:  Procedure Laterality Date  . CARDIAC EVENT MONITOR  July-August 2018   Normal sinus rhythm with sinus tachycardia.  Average heart rate 91 bpm.  Occasional PVCs.  One 12 and another 6 beat run of wide-complex tachycardia.  2 morphology PVCs.PVCs noted, but not during longer episode of WCT.  Marland Kitchen CESAREAN SECTION  1994  . CO2 LASER  APPLICATION N/A 0/30/0923   Procedure: CO2 LASER APPLICATION ;  Surgeon: Everitt Amber, MD;  Location: [facility];  Service: Gynecology;  Laterality: N/A;  . COLONOSCOPY, ESOPHAGOGASTRODUODENOSCOPY (EGD) AND ESOPHAGEAL DILATION  10-04-2013  . LAPAROSCOPIC CHOLECYSTECTOMY  1995  . LASER ABLATION CONDOLAMATA N/A 07/03/2015   Procedure: CO2 LASER OF VULVA ;  Surgeon: Everitt Amber, MD;  Location: [facility];  Service: Gynecology;  Laterality: N/A;  . NM MYOVIEW LTD  10/2016   LOW RISK.  No ischemia or infarction.  EF 54%.  . TRANSTHORACIC ECHOCARDIOGRAM  11/2010   Normal LV Size & Fxn - EF 55-60%. Mild-Mod MR. Normal DF.  Marland Kitchen TRANSTHORACIC ECHOCARDIOGRAM  08/2016   EF 60 of 65%.  Severe LVH.  Pseudo-normal filling (grade 2 diastolic dysfunction).  MAC with calcified mobile attachments.  Posterior leaflet prolapse.  Mild regurgitation.  Moderate LA dilation.    Current Meds  Medication Sig  . ALBUTEROL IN Inhale into the lungs as needed.  Marland Kitchen aspirin EC 81 MG tablet Take 81 mg by mouth daily. Reported on 07/16/2015  . atorvastatin (LIPITOR) 40 MG tablet Take 20 mg by mouth every evening.   Marland Kitchen azelastine (ASTELIN) 0.1 % nasal spray Place 2 sprays into both nostrils daily.  . fluticasone (FLONASE) 50 MCG/ACT nasal spray Place 2 sprays into both nostrils every morning.  . hydroxychloroquine (PLAQUENIL) 200 MG tablet Take 200 mg by mouth 2 (two) times daily.   Marland Kitchen levothyroxine (SYNTHROID, LEVOTHROID) 137 MCG tablet Take 137 mcg by mouth daily before breakfast.  . liothyronine (CYTOMEL) 5 MCG tablet Take 5 mcg by mouth daily.  . metoprolol tartrate (LOPRESSOR) 25 MG tablet TAKE 1/2 TABLET (12.5 MG) IN THE MORNING AND 2 TABLETS (50 MG) AT NIGHT.  Marland Kitchen mupirocin ointment (BACTROBAN) 2 % Apply 1 application topically 2 (two) times daily.  . nicotine polacrilex (NICORETTE) 2 MG gum Take 2 mg by mouth as needed for smoking cessation.  Marland Kitchen omeprazole (PRILOSEC) 40 MG capsule TAKE 1 CAPSULE(40  MG) BY MOUTH TWICE DAILY  . [DISCONTINUED] Fluticasone-Salmeterol (ADVAIR) 100-50 MCG/DOSE AEPB Inhale 1 puff into the lungs 2 (two) times daily.  . [DISCONTINUED] metoprolol tartrate (LOPRESSOR) 25 MG tablet Take 0.5 tablets (12.5 mg total) by mouth every morning AND 1.5 tablets (37.5 mg total) every evening. May take extra dose if needed.  . [DISCONTINUED] metoprolol tartrate (LOPRESSOR) 25 MG tablet Take by mouth. TAKE 1/2 TABLET (12.5 MG) IN THE MORNING AND 1.5 TABLETS (75 MG) AT NIGHT.   Current Facility-Administered Medications for the 03/28/19 encounter (Office Visit) with Ellyn Hack,  Leonie Green, MD  Medication  . 0.9 %  sodium chloride infusion    Allergies  Allergen Reactions  . Iodine Anaphylaxis  . Iohexol Anaphylaxis     Desc: Patient states she is allergic to iodine and had a "code blue" incident after an injection for some type of imaging study.   . Penicillins Rash    PCN IN LARGE DOSES.    Social History   Tobacco Use  . Smoking status: Former Smoker    Packs/day: 1.00    Years: 48.00    Pack years: 48.00    Types: Cigarettes    Quit date: 05/10/2014    Years since quitting: 4.8  . Smokeless tobacco: Never Used  . Tobacco comment: using nicorrette  Substance Use Topics  . Alcohol use: Yes    Alcohol/week: 2.0 standard drinks    Types: 2 Standard drinks or equivalent per week    Comment: Social - > is now essentially quit  . Drug use: No   Social History   Social History Narrative   No routine exercise. Describes herself as "lazy "    family history includes COPD in her brother; Colon cancer in her father and maternal grandfather; Dementia in her brother; Heart attack in her brother and mother; Heart disease in her mother; Pancreatic cancer in her father.  Wt Readings from Last 3 Encounters:  03/28/19 244 lb (110.7 kg)  03/12/19 235 lb (106.6 kg)  02/27/19 235 lb (106.6 kg)    PHYSICAL EXAM BP 110/82 (BP Location: Left Arm, Patient Position: Sitting, Cuff  Size: Large)   Pulse 86   Temp 98 F (36.7 C)   Ht _0  (1.727 m)   Wt 244 lb (110.7 kg)   BMI 37.10 kg/m  Physical Exam  Constitutional: She is oriented to person, place, and time. She appears well-developed and well-nourished. She appears distressed.  Moderate-morbidly obese woman.  Well-groomed.  Does appear to be in some amount of respiratory distress walking in, but when she is seated becomes more comfortable.  HENT:  Head: Normocephalic and atraumatic.  Neck: No hepatojugular reflux and no JVD present. Carotid bruit is not present.  Cardiovascular: Normal rate, regular rhythm, S1 normal and S2 normal.  Occasional extrasystoles are present. PMI is not displaced (Unable to assess). Exam reveals distant heart sounds and decreased pulses (Pulses diminished bilateral pedal because of edema). Exam reveals no gallop and no friction rub.  Murmur heard. High-pitched harsh crescendo-decrescendo midsystolic murmur is present with a grade of 2/6 at the upper right sternal border. High-pitched blowing decrescendo holosystolic murmur of grade 4/6 is also present at the apex radiating to the back. Unable to palpate PMI  Pulmonary/Chest: Effort normal and breath sounds normal. No respiratory distress. She has no rales.  Distant breath sounds  Abdominal: Soft. Bowel sounds are normal. She exhibits distension (Mild distention, no ascites). There is no abdominal tenderness.  Obese  Musculoskeletal:        General: Edema (Roughly 2+ bilateral LE) present. Normal range of motion.     Cervical back: Normal range of motion and neck supple.  Neurological: She is alert and oriented to person, place, and time. No cranial nerve deficit.  Skin: She is not diaphoretic.  Psychiatric: She has a normal mood and affect. Her behavior is normal. Judgment and thought content normal.  Somewhat down mood  Nursing note and vitals reviewed.  Adult ECG Report   Other studies Reviewed: Additional studies/ records  that were reviewed today  include:  Recent Labs: No new labs  Lipid panel Jul 05, 2018: TC 159, TG 110, HDL 41 LDL 96.  Pertinent chemistries from October 26, 2018: BUN 12, Cr 0.94, K+ 4.8.  Normal LFTs  ASSESSMENT / PLAN: Problem List Items Addressed This Visit    NSVT (nonsustained ventricular tachycardia) (HCC) (Chronic)    I suspect that her tachycardia that she is grabbing is not nonsustained VT.  Sounds more consistent with sinus tachycardia exacerbated by her severe MR.  However we will continue with metoprolol she is taking half tablet in the morning, will increase to the full 2 tablets in the evening.      Relevant Medications   furosemide (LASIX) 40 MG tablet   metoprolol tartrate (LOPRESSOR) 25 MG tablet   Acute on chronic diastolic heart failure (HCC) (Chronic)    She is clearly having symptoms of heart failure, most likely related to her worsening much regurgitation and diastolic dysfunction. She does not have a lot of blood pressure room in order to add afterload reduction, and I am worried about potential syncope.  Also concerned about her tachycardia.  We are increasing her nighttime dose of beta-blocker.  We will add Lasix 40 mg for 2 to 3 days and then reduce to 20 mg if symptoms improve.  Otherwise we will continue to 40 mg daily.      Relevant Medications   furosemide (LASIX) 40 MG tablet   metoprolol tartrate (LOPRESSOR) 25 MG tablet   Moderate to severe mitral regurgitation - Primary (Chronic)    She had a significant change in her murmur and now echocardiogram shows significant change in the intensity and severity of her mitral regurgitation.  There is also mitral valve prolapse noted.  Moderate to severe MR noted and her exam is clearly worse.  Thankfully EF has improved.  At this point I think with significant symptoms, we need to initiate evaluation for mitral valve repair.  Recommendation was TEE based on echo results, we will go and plan also for  right and left heart catheterization as part of preop assessment.  Following her TEE and cardiac catheterization, she will be referred to the heart valve clinic with Dr. Burt Knack and Dr. Roxy Manns  Plan: We will add low-dose Lasix for diuresis,  Increase nighttime dose of beta-blocker, blood pressure not quite adequate enough to add afterload reduction.  Plan TEE to better assess valve-this will be followed by right left heart catheterization (scheduled for April 10, 2019)      Relevant Medications   furosemide (LASIX) 40 MG tablet   metoprolol tartrate (LOPRESSOR) 25 MG tablet   Other Relevant Orders   Basic metabolic panel   LEFT AND RIGHT HEART CATHETERIZATION WITH CORONARY ANGIOGRAM   TRANSESOPHAGEAL ECHOCARDIOGRAM W/O CARDIOVERSION   PVC's (premature ventricular contractions) (Chronic)    Discussed avoiding triggers.  Increase p.m. dose of beta-blocker to 50 mg but continue half tablet during the daytime because of fatigue.      Relevant Medications   furosemide (LASIX) 40 MG tablet   metoprolol tartrate (LOPRESSOR) 25 MG tablet   Palpitations (Chronic)    What she is describing now is really tachycardia associate with activity.  This can probably be explained by decreased cardiac output from much regurgitation exacerbation.  Tachycardia is probably compensatory.  Increasing the nighttime dose of beta-blocker, but will avoid increasing daytime dose.  Have asked that she simply stay somewhat sedentary until further evaluation of her valve with TEE and cardiac cath.  Coronary artery calcification of native artery (Chronic)    She does have coronary calcification noted on CT scan, but her Myoview looked negative.  However with now significant worsening mitral valve, we do need to exclude ischemic CAD.  She would deftly benefit from right heart catheterization to measure pressures, will plan for right and left heart catheterization.  Continue atorvastatin. Continue aspirin and  beta-blocker.      Relevant Medications   furosemide (LASIX) 40 MG tablet   metoprolol tartrate (LOPRESSOR) 25 MG tablet   Other Relevant Orders   CBC   Basic metabolic panel   LEFT AND RIGHT HEART CATHETERIZATION WITH CORONARY ANGIOGRAM   TRANSESOPHAGEAL ECHOCARDIOGRAM W/O CARDIOVERSION   Mitral valve prolapse (Chronic)   Relevant Medications   furosemide (LASIX) 40 MG tablet   metoprolol tartrate (LOPRESSOR) 25 MG tablet   Other Relevant Orders   CBC   Basic metabolic panel   TRANSESOPHAGEAL ECHOCARDIOGRAM W/O CARDIOVERSION    Other Visit Diagnoses    Pre-op evaluation         Performing MD:  Glenetta Hew, M.D., M.S.  Procedure: RIGHT AND LEFT HEART CATHETERIZATION WITH CORONARY ANGIOGRAPHY  The procedure with Risks/Benefits/Alternatives and Indications was reviewed with the patient and husband.  All questions were answered.    Risks / Complications include, but not limited to: Death, MI, CVA/TIA, VF/VT (with defibrillation), Bradycardia (need for temporary pacer placement), contrast induced nephropathy , bleeding / bruising / hematoma / pseudoaneurysm, vascular or coronary injury (with possible emergent CT or Vascular Surgery), adverse medication reactions, infection.  Additional risks involving the use of radiation with the possibility of radiation burns and cancer were explained in detail. ->  She does have contrast hypersensitivity with reported anaphylaxis.  We will premedicate with prednisone and Benadryl per protocol.  The patient (and family) voice understanding and agree to proceed.     I spent a total of 74mnutes with the patient. >  50% of the time was spent in direct patient consultation.  Additional time spent with chart review  / charting (studies, outside notes, etc): 16 Total Time: 44 min   Current medicines are reviewed at length with the patient today. (+/- concerns) none The following changes have been made:None  Patient Instructions  Medication  Instructions:  START TAKING  METOPROLOL  EVENING  DOSE   AT BEDTIME - INCREASE TO 2 TABLETS (TOTAL 50 MG)   START TAKING FUROSIMDE ( LASIX)  40 MG  EVERYDAY FOR 2 DAYS  THEN  YOU MAY TAKE  1/2 TO 1 TABLET  UP TOTWICE A DAY  AS NEEDED FOR SWELLING.  *If you need a refill on your cardiac medications before your next appointment, please call your pharmacy*  Lab Work: Cbc  BMP ON FEB 26  ( COME TO THE OFFICE  FOR LABS) THEN GO TO [facility] FOR   COVID TEST   THEN YOU WILL NEED TO ELF ISOLATE UNTIL TESTING ON Tuesday April 10, 2019  If you have labs (blood work) drawn today and your tests are completely normal, you will receive your results only by: .Marland KitchenMyChart Message (if you have MyChart) OR . A paper copy in the mail If you have any lab test that is abnormal or we need to change your treatment, we will call you to review the results.  Testing/Procedures: schedule  [facility] Your physician has requested that you have a TEE. During a TEE, sound waves are used to create  images of your heart. It provides your doctor with information about the size and shape of your heart and how well your heart's chambers and valves are working. In this test, a transducer is attached to the end of a flexible tube that's guided down your throat and into your esophagus (the tube leading from you mouth to your stomach) to get a more detailed image of your heart. You are not awake for the procedure. Please see the instruction sheet given to you today. For further information please visit [facility]ugeFiesta[facility][email or web address]. And Your physician has requested that you have a  Right and left cardiac catheterization. Cardiac catheterization is used to diagnose and/or treat various heart conditions. Doctors may recommend this procedure for a number of different reasons. The most common reason is to evaluate chest pain. Chest pain can be a symptom of coronary artery disease (CAD), and cardiac catheterization can  show whether plaque is narrowing or blocking your heart's arteries. This procedure is also used to evaluate the valves, as well as measure the blood flow and oxygen levels in different parts of your heart. For further information please visit [email or web address]. Please follow instruction sheet, as given.    Follow-Up: At [facility], you and your health needs are our priority.  As part of our continuing mission to provide you with exceptional heart care, we have created designated Provider Care Teams.  These Care Teams include your primary Cardiologist (physician) and Advanced Practice Providers (APPs -  Physician Assistants and Nurse Practitioners) who all work together to provide you with the care you need, when you need it.  Your next appointment:   2 week(s)  The format for your next appointment:   In Person  Provider:   Glenetta Hew, MD  Other Instructions Instructions for TEE and cardiac catheterization provided     Studies Ordered:   Orders Placed This Encounter  Procedures  . CBC  . Basic metabolic panel  . LEFT AND RIGHT HEART CATHETERIZATION WITH CORONARY ANGIOGRAM  . TRANSESOPHAGEAL ECHOCARDIOGRAM W/O CARDIOVERSION      Glenetta Hew, M.D., M.S. Interventional Cardiologist   Pager # [phone] Phone # [phone] [address] [place], [place] [address]

## 2019-03-28 NOTE — Assessment & Plan Note (Signed)
She does have coronary calcification noted on CT scan, but her Myoview looked negative.  However with now significant worsening mitral valve, we do need to exclude ischemic CAD.  She would deftly benefit from right heart catheterization to measure pressures, will plan for right and left heart catheterization.  Continue atorvastatin. Continue aspirin and beta-blocker.

## 2019-03-28 NOTE — Assessment & Plan Note (Signed)
Discussed avoiding triggers.  Increase p.m. dose of beta-blocker to 50 mg but continue half tablet during the daytime because of fatigue.

## 2019-03-28 NOTE — H&P (View-Only) (Signed)
PCP: System, Pcp Not In, Dr. Juleen China Rheumatologist: Dr. Danne Harbor, also from [facility] Note: Chief Complaint  Patient presents with  . Follow-up    1 month post echo and myoview. -->  Moderate to severe MR  . Edema  . Shortness of Breath  . Headache  . Edema    Right side of her stomach.   HPI:Rebecca Carr is a 69 y.o. female initially seen for palpitations and heart murmur-now here for 1 month follow-up after Echocardiogram, and Myoview revealing moderate to severe much regurgitation with mitral prolapse.  She had reduced her caffeine and chocolate intake, and noted that her palpitations notably improved.   --> She had reduced to 12.5 mg (1/2) tablet of metoprolol in the morning and a full tablet in the evening of 25 mg -reduced due to some fatigue.. --> Back in July 2019 she was being evaluated for arthritis pains and was noted to have positive ANA and was given diagnosis of Hashimoto's thyroiditis and possible lupus/Sjogren's) -- suggestion is LUPUS > Sjogren's - Rheumatologist mentioned that she may have a clotting disorder. (Dr. Lollie Sails [facility])  She was seen in September 2020 for worsening palpitations.  She felt like she was going through withdrawal symptoms from having Effexor stopped and started on Prozac.  She had nausea, vomiting, nightmares as well as palpitations. Otherwise no significant symptoms other than palpitations and exertional dyspnea..  Rebecca Carr was last seen on 02/27/2019 -> noting that she had a several bad months.  Lots of social stress (revolving around health issues with her ex-husband).  Had had several spells lasting 3-4 nights of tachycardia milligrams a night.  Tachycardia associated with lying down, associated chest tightness and dizziness as well as dyspnea.  Significant DOE with some chest discomfort.  Some of the chest discomfort is related to certain movements.  Recent Hospitalizations: none  Studies  Personally Reviewed - (if available, images/films reviewed: From Epic Chart or Care Everywhere)  Chest CT - Cardiomegaly. LAD Calcification.  03/12/2019-TTE: EF 60 to 65%.  GRII DD-correlated with severe LA dilation..  Moderate RA dilation.  Moderate thickening in anterior MV leaflet.  Significant posterior leaflet prolapse with moderate to severe MR-eccentric direction.  Mild mitral stenosis.  Aortic valve sclerosis no stenosis.  Moderately elevated pulmonary pressures. --Recommend TEE for better assessment of MR.  03/11/2018 Myoview: EF 65-70%.  No ischemia or infarction.  LOW RISK.  Interval History:  Rebecca Carr returns here today having had a Myoview and echo done revealing significant mitral regurgitation.  She is profoundly dyspneic, wheezing and struggling to catch her breath simply walking back to the clinic exam room.  She is profoundly dyspneic with orthopnea and PND as well as now worsening edema.  She is at the point where she feels like something needs to be done.  She says that whenever she does any type of activity she feels her heart rate going up really fast and she gets so short of breath where she has to stop after 20 to 30 feet to catch her breath.  No associated chest pain or pressure.  She does not have any resting dyspnea or resting tachycardia symptoms.  She may feel some fluttering, but none of the tachycardia sensation. She is always had a little swelling but this is more than she is usually had and it is getting to the point of distention.  She is having to sleep pretty much sitting upright, but unfortunately has  not been using her CPAP because she was concerned about having "fluid on the lungs ".  Cardiovascular ROS: positive for - dyspnea on exertion, edema, orthopnea, paroxysmal nocturnal dyspnea, rapid heart rate and Now really only noting the tachycardia with, she says her heart rate will go from the mid 80s rate is at baseline up into the 140s 150s range simply walking  across the room. negative for - chest pain, irregular heartbeat, shortness of breath or No resting symptoms of shortness of breath.  No syncope/near syncope or TIA/amaurosis fugax.  She does get lightheaded when she is tachycardic but not syncope.   ROS: A comprehensive was performed. Review of Systems  Constitutional: Positive for malaise/fatigue (Chronic, but now worse than before). Negative for chills and weight loss (Weight is going up).  HENT: Positive for congestion (with chronic drainage.). Negative for hearing loss and nosebleeds.   Eyes:       Mutiple eye infections  Respiratory: Positive for cough (Now having dry hacking cough, worse with orthopnea), shortness of breath (If she overexerts) and wheezing.   Cardiovascular: Positive for palpitations (per HPI). Negative for claudication.       Per HPI  Gastrointestinal: Positive for heartburn. Negative for abdominal pain (cramping in the upper abdomen with tachycardia spells), blood in stool, melena and nausea.  Genitourinary: Negative for dysuria and hematuria.  Musculoskeletal: Positive for back pain, joint pain and myalgias (6 more frequent cramps.  Cramps run down both legs and into the toes.). Negative for falls.       Has chronic pain  Neurological: Positive for dizziness (much improved & not associated wiht palpitation. ) and tingling. Negative for focal weakness, weakness and headaches.       Loss of sensation - numbness R foot  Psychiatric/Behavioral: Positive for depression. Negative for memory loss. The patient is nervous/anxious (Very anxious when I start on my explanation about heart catheterization). The patient does not have insomnia.        Has been doing a lot of social stress  All other systems reviewed and are negative.   The patient does not have symptoms concerning for COVID-19 infection (fever, chills, cough, or new shortness of breath).  She is having the shortness of breath, but is clearly associated with  exertional dyspnea only. The patient is practicing social distancing.   COVID-19 Education: The signs and symptoms of COVID-19 were discussed with the patient and how to seek care for testing (follow up with PCP or arrange E-visit).   The importance of social distancing was discussed today.   I have reviewed and (if needed) personally updated the patient's problem list, medications, allergies, past medical and surgical history, social and family history.   Past Medical History:  Diagnosis Date  . Anxiety   . Arthritis   . DES exposure in utero   . Diverticulosis of colon   . Dizziness   . Environmental and seasonal allergies   . GERD (gastroesophageal reflux disease)   . Hashimoto's thyroiditis   . Heart palpitations   . Hiatal hernia   . History of adenomatous polyp of colon    hyperplastic 2015  and tubular adenoma 2005  . History of esophageal dilatation    of stricture  . History of esophagitis    LA class , grade B and Gastritis  . History of pituitary tumor    dx 1990's - asymptomatic /  per pt last scan 2005 approx.  resolved   . Hyperlipidemia   . Hypothyroidism   .  Leg pain, bilateral   . Mild intermittent asthma   . Moderate mitral regurgitation by prior echocardiography 2012   1-2 MR on Echo  . MTHFR mutation ([place])   . MVP (mitral valve prolapse)    Mild calcified posterior leaflet prolapse on echo July 2018.  Mild MR.  . OSA (obstructive sleep apnea)    non-compliant cpap  . Prediabetes   . SUI (stress urinary incontinence, female)   . VIN III (vulvar intraepithelial neoplasia III)   . Wears glasses     Past Surgical History:  Procedure Laterality Date  . CARDIAC EVENT MONITOR  July-August 2018   Normal sinus rhythm with sinus tachycardia.  Average heart rate 91 bpm.  Occasional PVCs.  One 12 and another 6 beat run of wide-complex tachycardia.  2 morphology PVCs.PVCs noted, but not during longer episode of WCT.  Marland Kitchen CESAREAN SECTION  1994  . CO2 LASER  APPLICATION N/A 0/30/0923   Procedure: CO2 LASER APPLICATION ;  Surgeon: Everitt Amber, MD;  Location: [facility];  Service: Gynecology;  Laterality: N/A;  . COLONOSCOPY, ESOPHAGOGASTRODUODENOSCOPY (EGD) AND ESOPHAGEAL DILATION  10-04-2013  . LAPAROSCOPIC CHOLECYSTECTOMY  1995  . LASER ABLATION CONDOLAMATA N/A 07/03/2015   Procedure: CO2 LASER OF VULVA ;  Surgeon: Everitt Amber, MD;  Location: [facility];  Service: Gynecology;  Laterality: N/A;  . NM MYOVIEW LTD  10/2016   LOW RISK.  No ischemia or infarction.  EF 54%.  . TRANSTHORACIC ECHOCARDIOGRAM  11/2010   Normal LV Size & Fxn - EF 55-60%. Mild-Mod MR. Normal DF.  Marland Kitchen TRANSTHORACIC ECHOCARDIOGRAM  08/2016   EF 60 of 65%.  Severe LVH.  Pseudo-normal filling (grade 2 diastolic dysfunction).  MAC with calcified mobile attachments.  Posterior leaflet prolapse.  Mild regurgitation.  Moderate LA dilation.    Current Meds  Medication Sig  . ALBUTEROL IN Inhale into the lungs as needed.  Marland Kitchen aspirin EC 81 MG tablet Take 81 mg by mouth daily. Reported on 07/16/2015  . atorvastatin (LIPITOR) 40 MG tablet Take 20 mg by mouth every evening.   Marland Kitchen azelastine (ASTELIN) 0.1 % nasal spray Place 2 sprays into both nostrils daily.  . fluticasone (FLONASE) 50 MCG/ACT nasal spray Place 2 sprays into both nostrils every morning.  . hydroxychloroquine (PLAQUENIL) 200 MG tablet Take 200 mg by mouth 2 (two) times daily.   Marland Kitchen levothyroxine (SYNTHROID, LEVOTHROID) 137 MCG tablet Take 137 mcg by mouth daily before breakfast.  . liothyronine (CYTOMEL) 5 MCG tablet Take 5 mcg by mouth daily.  . metoprolol tartrate (LOPRESSOR) 25 MG tablet TAKE 1/2 TABLET (12.5 MG) IN THE MORNING AND 2 TABLETS (50 MG) AT NIGHT.  Marland Kitchen mupirocin ointment (BACTROBAN) 2 % Apply 1 application topically 2 (two) times daily.  . nicotine polacrilex (NICORETTE) 2 MG gum Take 2 mg by mouth as needed for smoking cessation.  Marland Kitchen omeprazole (PRILOSEC) 40 MG capsule TAKE 1 CAPSULE(40  MG) BY MOUTH TWICE DAILY  . [DISCONTINUED] Fluticasone-Salmeterol (ADVAIR) 100-50 MCG/DOSE AEPB Inhale 1 puff into the lungs 2 (two) times daily.  . [DISCONTINUED] metoprolol tartrate (LOPRESSOR) 25 MG tablet Take 0.5 tablets (12.5 mg total) by mouth every morning AND 1.5 tablets (37.5 mg total) every evening. May take extra dose if needed.  . [DISCONTINUED] metoprolol tartrate (LOPRESSOR) 25 MG tablet Take by mouth. TAKE 1/2 TABLET (12.5 MG) IN THE MORNING AND 1.5 TABLETS (75 MG) AT NIGHT.   Current Facility-Administered Medications for the 03/28/19 encounter (Office Visit) with Ellyn Hack,  Leonie Green, MD  Medication  . 0.9 %  sodium chloride infusion    Allergies  Allergen Reactions  . Iodine Anaphylaxis  . Iohexol Anaphylaxis     Desc: Patient states she is allergic to iodine and had a "code blue" incident after an injection for some type of imaging study.   . Penicillins Rash    PCN IN LARGE DOSES.    Social History   Tobacco Use  . Smoking status: Former Smoker    Packs/day: 1.00    Years: 48.00    Pack years: 48.00    Types: Cigarettes    Quit date: 05/10/2014    Years since quitting: 4.8  . Smokeless tobacco: Never Used  . Tobacco comment: using nicorrette  Substance Use Topics  . Alcohol use: Yes    Alcohol/week: 2.0 standard drinks    Types: 2 Standard drinks or equivalent per week    Comment: Social - > is now essentially quit  . Drug use: No   Social History   Social History Narrative   No routine exercise. Describes herself as "lazy "    family history includes COPD in her brother; Colon cancer in her father and maternal grandfather; Dementia in her brother; Heart attack in her brother and mother; Heart disease in her mother; Pancreatic cancer in her father.  Wt Readings from Last 3 Encounters:  03/28/19 244 lb (110.7 kg)  03/12/19 235 lb (106.6 kg)  02/27/19 235 lb (106.6 kg)    PHYSICAL EXAM BP 110/82 (BP Location: Left Arm, Patient Position: Sitting, Cuff  Size: Large)   Pulse 86   Temp 98 F (36.7 C)   Ht _0  (1.727 m)   Wt 244 lb (110.7 kg)   BMI 37.10 kg/m  Physical Exam  Constitutional: She is oriented to person, place, and time. She appears well-developed and well-nourished. She appears distressed.  Moderate-morbidly obese woman.  Well-groomed.  Does appear to be in some amount of respiratory distress walking in, but when she is seated becomes more comfortable.  HENT:  Head: Normocephalic and atraumatic.  Neck: No hepatojugular reflux and no JVD present. Carotid bruit is not present.  Cardiovascular: Normal rate, regular rhythm, S1 normal and S2 normal.  Occasional extrasystoles are present. PMI is not displaced (Unable to assess). Exam reveals distant heart sounds and decreased pulses (Pulses diminished bilateral pedal because of edema). Exam reveals no gallop and no friction rub.  Murmur heard. High-pitched harsh crescendo-decrescendo midsystolic murmur is present with a grade of 2/6 at the upper right sternal border. High-pitched blowing decrescendo holosystolic murmur of grade 4/6 is also present at the apex radiating to the back. Unable to palpate PMI  Pulmonary/Chest: Effort normal and breath sounds normal. No respiratory distress. She has no rales.  Distant breath sounds  Abdominal: Soft. Bowel sounds are normal. She exhibits distension (Mild distention, no ascites). There is no abdominal tenderness.  Obese  Musculoskeletal:        General: Edema (Roughly 2+ bilateral LE) present. Normal range of motion.     Cervical back: Normal range of motion and neck supple.  Neurological: She is alert and oriented to person, place, and time. No cranial nerve deficit.  Skin: She is not diaphoretic.  Psychiatric: She has a normal mood and affect. Her behavior is normal. Judgment and thought content normal.  Somewhat down mood  Nursing note and vitals reviewed.  Adult ECG Report   Other studies Reviewed: Additional studies/ records  that were reviewed today  include:  Recent Labs: No new labs  Lipid panel Jul 05, 2018: TC 159, TG 110, HDL 41 LDL 96.  Pertinent chemistries from October 26, 2018: BUN 12, Cr 0.94, K+ 4.8.  Normal LFTs  ASSESSMENT / PLAN: Problem List Items Addressed This Visit    NSVT (nonsustained ventricular tachycardia) (HCC) (Chronic)    I suspect that her tachycardia that she is grabbing is not nonsustained VT.  Sounds more consistent with sinus tachycardia exacerbated by her severe MR.  However we will continue with metoprolol she is taking half tablet in the morning, will increase to the full 2 tablets in the evening.      Relevant Medications   furosemide (LASIX) 40 MG tablet   metoprolol tartrate (LOPRESSOR) 25 MG tablet   Acute on chronic diastolic heart failure (HCC) (Chronic)    She is clearly having symptoms of heart failure, most likely related to her worsening much regurgitation and diastolic dysfunction. She does not have a lot of blood pressure room in order to add afterload reduction, and I am worried about potential syncope.  Also concerned about her tachycardia.  We are increasing her nighttime dose of beta-blocker.  We will add Lasix 40 mg for 2 to 3 days and then reduce to 20 mg if symptoms improve.  Otherwise we will continue to 40 mg daily.      Relevant Medications   furosemide (LASIX) 40 MG tablet   metoprolol tartrate (LOPRESSOR) 25 MG tablet   Moderate to severe mitral regurgitation - Primary (Chronic)    She had a significant change in her murmur and now echocardiogram shows significant change in the intensity and severity of her mitral regurgitation.  There is also mitral valve prolapse noted.  Moderate to severe MR noted and her exam is clearly worse.  Thankfully EF has improved.  At this point I think with significant symptoms, we need to initiate evaluation for mitral valve repair.  Recommendation was TEE based on echo results, we will go and plan also for  right and left heart catheterization as part of preop assessment.  Following her TEE and cardiac catheterization, she will be referred to the heart valve clinic with Dr. Burt Knack and Dr. Roxy Manns  Plan: We will add low-dose Lasix for diuresis,  Increase nighttime dose of beta-blocker, blood pressure not quite adequate enough to add afterload reduction.  Plan TEE to better assess valve-this will be followed by right left heart catheterization (scheduled for April 10, 2019)      Relevant Medications   furosemide (LASIX) 40 MG tablet   metoprolol tartrate (LOPRESSOR) 25 MG tablet   Other Relevant Orders   Basic metabolic panel   LEFT AND RIGHT HEART CATHETERIZATION WITH CORONARY ANGIOGRAM   TRANSESOPHAGEAL ECHOCARDIOGRAM W/O CARDIOVERSION   PVC's (premature ventricular contractions) (Chronic)    Discussed avoiding triggers.  Increase p.m. dose of beta-blocker to 50 mg but continue half tablet during the daytime because of fatigue.      Relevant Medications   furosemide (LASIX) 40 MG tablet   metoprolol tartrate (LOPRESSOR) 25 MG tablet   Palpitations (Chronic)    What she is describing now is really tachycardia associate with activity.  This can probably be explained by decreased cardiac output from much regurgitation exacerbation.  Tachycardia is probably compensatory.  Increasing the nighttime dose of beta-blocker, but will avoid increasing daytime dose.  Have asked that she simply stay somewhat sedentary until further evaluation of her valve with TEE and cardiac cath.  Coronary artery calcification of native artery (Chronic)    She does have coronary calcification noted on CT scan, but her Myoview looked negative.  However with now significant worsening mitral valve, we do need to exclude ischemic CAD.  She would deftly benefit from right heart catheterization to measure pressures, will plan for right and left heart catheterization.  Continue atorvastatin. Continue aspirin and  beta-blocker.      Relevant Medications   furosemide (LASIX) 40 MG tablet   metoprolol tartrate (LOPRESSOR) 25 MG tablet   Other Relevant Orders   CBC   Basic metabolic panel   LEFT AND RIGHT HEART CATHETERIZATION WITH CORONARY ANGIOGRAM   TRANSESOPHAGEAL ECHOCARDIOGRAM W/O CARDIOVERSION   Mitral valve prolapse (Chronic)   Relevant Medications   furosemide (LASIX) 40 MG tablet   metoprolol tartrate (LOPRESSOR) 25 MG tablet   Other Relevant Orders   CBC   Basic metabolic panel   TRANSESOPHAGEAL ECHOCARDIOGRAM W/O CARDIOVERSION    Other Visit Diagnoses    Pre-op evaluation         Performing MD:  Glenetta Hew, M.D., M.S.  Procedure: RIGHT AND LEFT HEART CATHETERIZATION WITH CORONARY ANGIOGRAPHY  The procedure with Risks/Benefits/Alternatives and Indications was reviewed with the patient and husband.  All questions were answered.    Risks / Complications include, but not limited to: Death, MI, CVA/TIA, VF/VT (with defibrillation), Bradycardia (need for temporary pacer placement), contrast induced nephropathy , bleeding / bruising / hematoma / pseudoaneurysm, vascular or coronary injury (with possible emergent CT or Vascular Surgery), adverse medication reactions, infection.  Additional risks involving the use of radiation with the possibility of radiation burns and cancer were explained in detail. ->  She does have contrast hypersensitivity with reported anaphylaxis.  We will premedicate with prednisone and Benadryl per protocol.  The patient (and family) voice understanding and agree to proceed.     I spent a total of 74mnutes with the patient. >  50% of the time was spent in direct patient consultation.  Additional time spent with chart review  / charting (studies, outside notes, etc): 16 Total Time: 44 min   Current medicines are reviewed at length with the patient today. (+/- concerns) none The following changes have been made:None  Patient Instructions  Medication  Instructions:  START TAKING  METOPROLOL  EVENING  DOSE   AT BEDTIME - INCREASE TO 2 TABLETS (TOTAL 50 MG)   START TAKING FUROSIMDE ( LASIX)  40 MG  EVERYDAY FOR 2 DAYS  THEN  YOU MAY TAKE  1/2 TO 1 TABLET  UP TOTWICE A DAY  AS NEEDED FOR SWELLING.  *If you need a refill on your cardiac medications before your next appointment, please call your pharmacy*  Lab Work: Cbc  BMP ON FEB 26  ( COME TO THE OFFICE  FOR LABS) THEN GO TO [facility] FOR   COVID TEST   THEN YOU WILL NEED TO ELF ISOLATE UNTIL TESTING ON Tuesday April 10, 2019  If you have labs (blood work) drawn today and your tests are completely normal, you will receive your results only by: .Marland KitchenMyChart Message (if you have MyChart) OR . A paper copy in the mail If you have any lab test that is abnormal or we need to change your treatment, we will call you to review the results.  Testing/Procedures: schedule  [facility] Your physician has requested that you have a TEE. During a TEE, sound waves are used to create  images of your heart. It provides your doctor with information about the size and shape of your heart and how well your heart's chambers and valves are working. In this test, a transducer is attached to the end of a flexible tube that's guided down your throat and into your esophagus (the tube leading from you mouth to your stomach) to get a more detailed image of your heart. You are not awake for the procedure. Please see the instruction sheet given to you today. For further information please visit [email or web address]. And Your physician has requested that you have a  Right and left cardiac catheterization. Cardiac catheterization is used to diagnose and/or treat various heart conditions. Doctors may recommend this procedure for a number of different reasons. The most common reason is to evaluate chest pain. Chest pain can be a symptom of coronary artery disease (CAD), and cardiac catheterization can  show whether plaque is narrowing or blocking your heart's arteries. This procedure is also used to evaluate the valves, as well as measure the blood flow and oxygen levels in different parts of your heart. For further information please visit [email or web address]. Please follow instruction sheet, as given.    Follow-Up: At [facility], you and your health needs are our priority.  As part of our continuing mission to provide you with exceptional heart care, we have created designated Provider Care Teams.  These Care Teams include your primary Cardiologist (physician) and Advanced Practice Providers (APPs -  Physician Assistants and Nurse Practitioners) who all work together to provide you with the care you need, when you need it.  Your next appointment:   2 week(s)  The format for your next appointment:   In Person  Provider:   Glenetta Hew, MD  Other Instructions Instructions for TEE and cardiac catheterization provided     Studies Ordered:   Orders Placed This Encounter  Procedures  . CBC  . Basic metabolic panel  . LEFT AND RIGHT HEART CATHETERIZATION WITH CORONARY ANGIOGRAM  . TRANSESOPHAGEAL ECHOCARDIOGRAM W/O CARDIOVERSION      Glenetta Hew, M.D., M.S. Interventional Cardiologist   Pager # [phone] Phone # [phone] [address] [place], [place] [address]

## 2019-03-28 NOTE — Assessment & Plan Note (Signed)
What she is describing now is really tachycardia associate with activity.  This can probably be explained by decreased cardiac output from much regurgitation exacerbation.  Tachycardia is probably compensatory.  Increasing the nighttime dose of beta-blocker, but will avoid increasing daytime dose.  Have asked that she simply stay somewhat sedentary until further evaluation of her valve with TEE and cardiac cath.

## 2019-03-28 NOTE — Patient Instructions (Signed)
Medication Instructions:  START TAKING  METOPROLOL  EVENING  DOSE   AT BEDTIME - INCREASE TO 2 TABLETS (TOTAL 50 MG)   START TAKING FUROSIMDE ( LASIX)  40 MG  EVERYDAY FOR 2 DAYS  THEN  YOU MAY TAKE  1/2 TO 1 TABLET  UP TOTWICE A DAY  AS NEEDED FOR SWELLING.  *If you need a refill on your cardiac medications before your next appointment, please call your pharmacy*  Lab Work: [facility]  BMP ON FEB 26  ( COME TO THE OFFICE  FOR LABS) THEN GO TO [address] FOR   COVID TEST   THEN YOU WILL NEED TO ELF ISOLATE UNTIL TESTING ON Tuesday April 10, 2019  If you have labs (blood work) drawn today and your tests are completely normal, you will receive your results only by: Marland Kitchen MyChart Message (if you have MyChart) OR . A paper copy in the mail If you have any lab test that is abnormal or we need to change your treatment, we will call you to review the results.  Testing/Procedures: schedule  At [facility]  April 10, 2019 Your physician has requested that you have a TEE. During a TEE, sound waves are used to create images of your heart. It provides your doctor with information about the size and shape of your heart and how well your heart's chambers and valves are working. In this test, a transducer is attached to the end of a flexible tube that's guided down your throat and into your esophagus (the tube leading from you mouth to your stomach) to get a more detailed image of your heart. You are not awake for the procedure. Please see the instruction sheet given to you today. For further information please visit [email or web address]. And Your physician has requested that you have a  Right and left cardiac catheterization. Cardiac catheterization is used to diagnose and/or treat various heart conditions. Doctors may recommend this procedure for a number of different reasons. The most common reason is to evaluate chest pain. Chest pain can be a symptom of coronary artery disease (CAD), and cardiac  catheterization can show whether plaque is narrowing or blocking your heart's arteries. This procedure is also used to evaluate the valves, as well as measure the blood flow and oxygen levels in different parts of your heart. For further information please visit HugeFiesta.[email or web address]. Please follow instruction sheet, as given.    Follow-Up: At [facility] you and your health needs are our priority.  As part of our continuing mission to provide you with exceptional heart care, we have created designated Provider Care Teams.  These Care Teams include your primary Cardiologist (physician) and Advanced Practice Providers (APPs -  Physician Assistants and Nurse Practitioners) who all work together to provide you with the care you need, when you need it.  Your next appointment:   2 week(s)  The format for your next appointment:   In Person  Provider:   Glenetta Hew, MD  Other Instructions    Dear  Rebecca Carr are scheduled for a TEE/ on April 10, 2019 with Dr.Turner.  Please arrive at the [facility] (Main Entrance A) at [facility]: [address] [place], [place] [address] at 7 am. (1 hour prior to procedure unless lab work is needed; if lab work is needed arrive 1.5 hours ahead)  DIET: Nothing to eat or drink after midnight except a sip of water with medications (see medication instructions below)  Medication Instructions:TAKE  MEDICATION WITH SIP OF WATER  Labs: labs on Feb 26,2021  CBC , BMP You must have a responsible person to drive you home and stay in the waiting area during your procedure. Failure to do so could result in cancellation.  Bring your insurance cards.  *Special Note: Every effort is made to have your procedure done on time. Occasionally there are emergencies that occur at the hospital that may cause delays. Please be patient if a delay does occur.         [facility] [address] [place] [address] Dept: [phone] Loc: [phone]  Rebecca Carr  Carr  You are scheduled for a Cardiac Catheterization on Tuesday, March 2 with Dr. Glenetta Hew.  1. Please arrive at the [facility] (Main Entrance A) at [facility]: [address] [place], [place] [address] at 7 AM (This time is two hours before your procedure to ensure your preparation). Free valet parking service is available.   Special note: Every effort is made to have your procedure done on time. Please understand that emergencies sometimes delay scheduled procedures.  2. Diet: Do not eat solid foods after midnight.  The patient may have clear liquids until 5am upon the day of the procedure.  3. Labs: You will need to have blood drawn on , February 26 at [place], [place]  Open: [place] (Lunch 12:30 - 1:30)   Phone: [phone]. You do not need to be fasting.  4. Medication instructions in preparation for your procedure:   Contrast Allergy: Yes, Please take Prednisone 50mg  by mouth at: Thirteen hours prior to cath 9:00pm on Monday Seven hours prior to cath 3:00am on Tuesday And prior to leaving home please take last dose of Prednisone 50mg  and Benadryl 50mg  by mouth. 6 AM  On the morning of your procedure, take your Aspirin 81 and any morning medicines NOT listed above.  You may use sips of water.  5. Plan for one night stay--bring personal belongings. 6. Bring a current list of your medications and current insurance cards. 7. You MUST have a responsible person to drive you home. 8. Someone MUST be with you the first 24 hours after you arrive home or your discharge will be delayed. 9. Please wear clothes that are easy to get on and off and wear slip-on shoes.  Thank you for allowing [place] to care for you!   -- [facility]

## 2019-03-29 ENCOUNTER — Institutional Professional Consult (permissible substitution): Payer: Medicare Other | Admitting: Internal Medicine

## 2019-03-29 NOTE — Telephone Encounter (Signed)
Late entry . Patientwas given result on 03/15/19 see result note

## 2019-04-03 ENCOUNTER — Ambulatory Visit (INDEPENDENT_AMBULATORY_CARE_PROVIDER_SITE_OTHER): Payer: Medicare Other | Admitting: Internal Medicine

## 2019-04-03 ENCOUNTER — Encounter: Payer: Self-pay | Admitting: Internal Medicine

## 2019-04-03 ENCOUNTER — Other Ambulatory Visit: Payer: Self-pay

## 2019-04-03 ENCOUNTER — Ambulatory Visit (INDEPENDENT_AMBULATORY_CARE_PROVIDER_SITE_OTHER): Payer: Medicare Other

## 2019-04-03 VITALS — BP 118/76 | HR 73 | Temp 97.1°F | Ht 68.0 in | Wt 234.4 lb

## 2019-04-03 DIAGNOSIS — R0600 Dyspnea, unspecified: Secondary | ICD-10-CM | POA: Diagnosis not present

## 2019-04-03 DIAGNOSIS — I2584 Coronary atherosclerosis due to calcified coronary lesion: Secondary | ICD-10-CM

## 2019-04-03 DIAGNOSIS — I340 Nonrheumatic mitral (valve) insufficiency: Secondary | ICD-10-CM

## 2019-04-03 DIAGNOSIS — R0609 Other forms of dyspnea: Secondary | ICD-10-CM

## 2019-04-03 DIAGNOSIS — I2510 Atherosclerotic heart disease of native coronary artery without angina pectoris: Secondary | ICD-10-CM | POA: Diagnosis not present

## 2019-04-03 DIAGNOSIS — G4733 Obstructive sleep apnea (adult) (pediatric): Secondary | ICD-10-CM

## 2019-04-03 DIAGNOSIS — J90 Pleural effusion, not elsewhere classified: Secondary | ICD-10-CM | POA: Diagnosis not present

## 2019-04-03 NOTE — Patient Instructions (Addendum)
Order- record release so we can get record of sleep studies, home care company and PFT results  Order- CXR  Dx mitral regurgitation, Dyspnea on exertion  Order - schedule HST   Dx OSA  Your CPAP is safe to use, but we may need to adjust the settings to make it comfortable.  Please call if we can help

## 2019-04-03 NOTE — Assessment & Plan Note (Signed)
Educated on safety and cardiac benefit of treating sleep apnea. Reassured her about safety of CPAP, it won't give her covid, etc. If she can't tolerate CPAP due to Sicca syndrome, then she might be a candidate for an oral appliance as discussed. She has been off CPAP 1 year, so Medicare will require updated documentation. Plan- HST, then probably try again with CPAP as first choice.

## 2019-04-03 NOTE — Assessment & Plan Note (Addendum)
We don't yet have PFT to define pulmonary status, but most of her DOE without cough or wheeze is probably due to her severe Mitral Regurgitation. She is comfortable with Dr Allison Quarry workup and plans.  [facility] records being requested.

## 2019-04-03 NOTE — Progress Notes (Signed)
04/03/19- 75 yoF former smoker for sleep evaluation. Self referred on recommendation by a friend. 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 "Took a while" to get used to CPAP, nasal pillows. With onset of Connective Tissue Disease and Sicca, she began having dryness of yes and nose/ nosebleeds, aggravated by CPAP. With onset of Covid epidemic last year, she became afraid to use her CPAP and hasn't used it in a year. Had tried nasal saline gel and oral wetting solution.   ENT surgery limited to tonsils.     Quit smoking in 2016, but still chews nicotine gum. Denies cough, occ mild wheeze. Inhalers no help. No O2. Very limited by DOE and rapid heart beat on simple ADLS at home. Can drive. Hx of pneumonia, chronic bronchitis. CT chest at [facility] showed pleural effusions, CAD. Cardiology follows for moderately severe mitral regurg. Pending further w/u for possible surgical repair. Has had PFT at [facility]- seeking records.   Prior to Admission medications   Medication Sig Start Date End Date Taking? Authorizing Provider  ALBUTEROL IN Inhale 1-2 puffs into the lungs every 6 (six) hours as needed.    Yes [provider]  aspirin EC 81 MG tablet Take 81 mg by mouth at bedtime. Reported on 07/16/2015   Yes [provider]  atorvastatin (LIPITOR) 40 MG tablet Take 40 mg by mouth at bedtime.    Yes [provider]  azelastine (ASTELIN) 0.1 % nasal spray Place 2 sprays into both nostrils at bedtime.  07/14/16  Yes [provider]  Cyanocobalamin (VITAMIN B-12) 5000 MCG LOZG Take 5,000 mcg by mouth daily.   Yes [provider]  FLUoxetine (PROZAC) 20 MG capsule Take 20 mg by mouth daily at 6 PM.  (1700)   Yes [provider]  fluticasone (FLONASE) 50 MCG/ACT nasal spray Place 2 sprays into both nostrils at bedtime.    Yes [provider]  furosemide (LASIX) 40 MG tablet TAKE 20 MG TO 40 MG TWICE A DAY AS NEEDED FOR SWELLING Patient taking differently: Take 20 mg by mouth 2 (two) times daily as needed (swelling.).  03/28/19  Yes Leonie Man, MD  hydroxychloroquine (PLAQUENIL) 200 MG tablet Take 200 mg by mouth 2 (two) times daily.  07/03/18  Yes [provider]  Javier Docker Oil 500 MG CAPS Take 500 mg by mouth daily.   Yes [provider]  levothyroxine (SYNTHROID) 125 MCG tablet Take 125 mcg by mouth daily before breakfast.   Yes [provider]  liothyronine (CYTOMEL) 5 MCG tablet Take 5 mcg by mouth daily. 07/07/17  Yes [provider]  metoprolol tartrate (LOPRESSOR) 25 MG tablet TAKE 1/2 TABLET (12.5 MG) IN THE MORNING AND 2 TABLETS (50 MG) AT NIGHT. Patient taking differently: Take 12.5-50 mg by mouth See admin instructions. TAKE 1/2 TABLET (12.5 MG) IN THE MORNING AND 2 TABLETS (50 MG) AT NIGHT. 03/28/19  Yes Leonie Man, MD  Multiple Vitamin (MULTIVITAMIN WITH MINERALS) TABS tablet Take 1 tablet by mouth daily. Multivitamin Essentials   Yes [provider]  neomycin-bacitracin-polymyxin (NEOSPORIN) ointment Apply 1 application topically as needed for wound care (applied to affected area of foot.).   Yes [provider]  nicotine polacrilex (NICORETTE) 2 MG gum Take 2 mg by mouth  as needed for smoking cessation.   Yes [provider]  omeprazole (PRILOSEC) 40 MG capsule TAKE 1 CAPSULE(40 MG) BY MOUTH TWICE DAILY Patient taking differently: Take 40 mg by mouth daily before breakfast.  02/05/19  Yes Ladene Artist, MD  predniSONE (DELTASONE) 50 MG tablet Contrast Allergy: Yes, Please take Prednisone 75m by mouth at: Thirteen hours prior to cath 9:00pm on Monday,Seven hours prior to cath 3:00am on  Tuesday And prior to leaving home please take last dose of Prednisone 510mand Benadryl 5059my mouth. 6 AM Patient not taking: Reported on 04/03/2019 03/28/19   HarLeonie ManD   Past Medical History:  Diagnosis Date  . Anxiety   . Arthritis   . DES exposure in utero   . Diverticulosis of colon   . Dizziness   . Environmental and seasonal allergies   . GERD (gastroesophageal reflux disease)   . Hashimoto's thyroiditis   . Heart palpitations   . Hiatal hernia   . History of adenomatous polyp of colon    hyperplastic 2015  and tubular adenoma 2005  . History of esophageal dilatation    of stricture  . History of esophagitis    LA class , grade B and Gastritis  . History of pituitary tumor    dx 1990's - asymptomatic /  per pt last scan 2005 approx.  resolved   . Hyperlipidemia   . Hypothyroidism   . Leg pain, bilateral   . Mild intermittent asthma   . Moderate mitral regurgitation by prior echocardiography 2012   1-2 MR on Echo  . MTHFR mutation ([place] . MVP (mitral valve prolapse)    Mild calcified posterior leaflet prolapse on echo July 2018.  Mild MR.  . OSA (obstructive sleep apnea)    non-compliant cpap  . Prediabetes   . SUI (stress urinary incontinence, female)   . VIN III (vulvar intraepithelial neoplasia III)   . Wears glasses    Past Surgical History:  Procedure Laterality Date  . CARDIAC EVENT MONITOR  July-August 2018   Normal sinus rhythm with sinus tachycardia.  Average heart rate 91 bpm.  Occasional PVCs.  One 12 and another 6 beat run of wide-complex tachycardia.  2 morphology PVCs.PVCs noted, but not during longer episode of WCT.  . CMarland KitchenSAREAN SECTION  1994  . CO2 LASER APPLICATION N/A 07/04/60/8315Procedure: CO2 LASER APPLICATION ;  Surgeon: EmmEveritt AmberD;  [facility];  Laterality: N/A;  . COLONOSCOPY, ESOPHAGOGASTRODUODENOSCOPY (EGD) AND ESOPHAGEAL DILATION  10-04-2013  . LAPAROSCOPIC CHOLECYSTECTOMY   1995  . LASER ABLATION CONDOLAMATA N/A 07/03/2015   Procedure: CO2 LASER OF VULVA ;  Surgeon: EmmEveritt AmberD;  [facility];  Laterality: N/A;  . NM MYOVIEW LTD  10/2016   LOW RISK.  No ischemia or infarction.  EF 54%.  . TRANSTHORACIC ECHOCARDIOGRAM  11/2010   Normal LV Size & Fxn - EF 55-60%. Mild-Mod MR. Normal DF.  . TMarland KitchenANSTHORACIC ECHOCARDIOGRAM  08/2016   EF 60 of 65%.  Severe LVH.  Pseudo-normal filling (grade 2 diastolic dysfunction).  MAC with calcified mobile attachments.  Posterior leaflet prolapse.  Mild regurgitation.  Moderate LA dilation.   Family History  Problem Relation Age of Onset  . Pancreatic cancer Father   . Colon cancer Father   . Heart attack Mother   . Heart disease Mother   . Heart attack Brother   .  COPD Brother   . Dementia Brother   . Colon cancer Maternal Grandfather   . Rectal cancer Neg Hx   . Stomach cancer Neg Hx   . Esophageal cancer Neg Hx    Social History   Socioeconomic History  . Marital status: Married    Spouse name: Jenny Reichmann  . Number of children: 1  . Years of education: Not on file  . Highest education level: Not on file  Occupational History  . Occupation: CUSTOMER SERVICE    Employer: [employer]    Comment: Is about to retire  Tobacco Use  . Smoking status: Former Smoker    Packs/day: 1.00    Years: 48.00    Pack years: 48.00    Types: Cigarettes    Quit date: 05/10/2014    Years since quitting: 4.9  . Smokeless tobacco: Never Used  . Tobacco comment: using nicorrette  Substance and Sexual Activity  . Alcohol use: Yes    Alcohol/week: 2.0 standard drinks    Types: 2 Standard drinks or equivalent per week    Comment: Social - > is now essentially quit  . Drug use: No  . Sexual activity: Not on file  Other Topics Concern  . Not on file  Social History Narrative   No routine exercise. Describes herself as "lazy "   Social Determinants of Health   Financial Resource  Strain:   . Difficulty of Paying Living Expenses: Not on file  Food Insecurity:   . Worried About Charity fundraiser in the Last Year: Not on file  . Ran Out of Food in the Last Year: Not on file  Transportation Needs:   . Lack of Transportation (Medical): Not on file  . Lack of Transportation (Non-Medical): Not on file  Physical Activity:   . Days of Exercise per Week: Not on file  . Minutes of Exercise per Session: Not on file  Stress:   . Feeling of Stress : Not on file  Social Connections:   . Frequency of Communication with Friends and Family: Not on file  . Frequency of Social Gatherings with Friends and Family: Not on file  . Attends Religious Services: Not on file  . Active Member of Clubs or Organizations: Not on file  . Attends Archivist Meetings: Not on file  . Marital Status: Not on file  Intimate Partner Violence:   . Fear of Current or Ex-Partner: Not on file  . Emotionally Abused: Not on file  . Physically Abused: Not on file  . Sexually Abused: Not on file   ROS-see HPI   + = positive Constitutional:    weight loss, night sweats, fevers, chills, fatigue, lassitude. HEENT:    headaches, difficulty swallowing, tooth/dental problems, sore throat,       sneezing, itching, ear ache, nasal congestion, post nasal drip, snoring CV:    chest pain, orthopnea, PND, swelling in lower extremities, anasarca,                                  dizziness, +palpitations Resp:   +shortness of breath with exertion or at rest.                productive cough,   non-productive cough, coughing up of blood.              change in color of mucus.  wheezing.   Skin:  rash or lesions. GI:  +heartburn, indigestion, abdominal pain, nausea, vomiting, diarrhea,                 change in bowel habits, loss of appetite GU: dysuria, change in color of urine, no urgency or frequency.   flank pain. MS:   joint pain, stiffness, decreased range of motion, back pain. Neuro-     nothing  unusual Psych:  change in mood or affect.  depression or +anxiety.   memory loss.  OBJ- Physical Exam General- Alert, Oriented, Affect-appropriate, Distress- none acute, + wheelchair Skin- rash-none, lesions- none, excoriation- none Lymphadenopathy- none Head- atraumatic            Eyes- Gross vision intact, PERRLA, conjunctivae and secretions clear            Ears- Hearing, canals-normal            Nose- Clear, no-Septal dev, mucus, polyps, erosion, perforation             Throat- Mallampati III , mucosa clear , drainage- none, tonsils- atrophic, + teeth Neck- flexible , trachea midline, no stridor , thyroid nl, carotid no bruit Chest - symmetrical excursion , unlabored           Heart/CV- RRR , +2/6 S murmur , no gallop  , no rub, nl s1 s2                           - JVD- none , edema+trace, stasis changes- none, varices- none           Lung- clear to P&A, wheeze- none, cough- none , dullness+ bases, rub- none           Chest wall-  Abd-  Br/ Gen/ Rectal- Not done, not indicated Extrem- cyanosis- none, clubbing, none, atrophy- none, strength- nl Neuro- grossly intact to observation

## 2019-04-05 ENCOUNTER — Telehealth: Payer: Self-pay | Admitting: Internal Medicine

## 2019-04-05 NOTE — Telephone Encounter (Signed)
Spoke with the pt and notified of results as stated below and she verbalized understanding:  CXR- heart is enlarged and there is fluid pooling in the chest" pleural effusions". This is probably caused by congestive heart failure and may respond to medication from the cardiologist. The pulmonary arteries are enlarged, indicating blood pressure in those arteries is higher than normal "pulmonary hypertension". The sleep study we have ordered will tell us about oxygen levels at night, which may be important

## 2019-04-06 ENCOUNTER — Other Ambulatory Visit (HOSPITAL_COMMUNITY)
Admission: RE | Admit: 2019-04-06 | Discharge: 2019-04-06 | Disposition: A | Discharge status: Home / Self Care | Payer: Medicare Other | Source: Ambulatory Visit | Attending: Cardiology | Admitting: Cardiology

## 2019-04-06 DIAGNOSIS — Z01812 Encounter for preprocedural laboratory examination: Secondary | ICD-10-CM | POA: Diagnosis not present

## 2019-04-06 DIAGNOSIS — Z20822 Contact with and (suspected) exposure to covid-19: Secondary | ICD-10-CM | POA: Insufficient documentation

## 2019-04-06 DIAGNOSIS — I341 Nonrheumatic mitral (valve) prolapse: Secondary | ICD-10-CM | POA: Diagnosis not present

## 2019-04-06 DIAGNOSIS — I2584 Coronary atherosclerosis due to calcified coronary lesion: Secondary | ICD-10-CM | POA: Diagnosis not present

## 2019-04-06 DIAGNOSIS — I340 Nonrheumatic mitral (valve) insufficiency: Secondary | ICD-10-CM | POA: Diagnosis not present

## 2019-04-06 DIAGNOSIS — I2510 Atherosclerotic heart disease of native coronary artery without angina pectoris: Secondary | ICD-10-CM | POA: Diagnosis not present

## 2019-04-06 LAB — BASIC METABOLIC PANEL
BUN/Creatinine Ratio: 17 (ref 12–28)
BUN: 19 mg/dL (ref 8–27)
CO2: 28 mmol/L (ref 20–29)
Calcium: 9.5 mg/dL (ref 8.7–10.3)
Chloride: 99 mmol/L (ref 96–106)
Creatinine, Ser: 1.15 mg/dL — ABNORMAL HIGH (ref 0.57–1.00)
GFR calc Af Amer: 56 mL/min/{1.73_m2} — ABNORMAL LOW (ref >59)
GFR calc non Af Amer: 49 mL/min/{1.73_m2} — ABNORMAL LOW (ref >59)
Glucose: 104 mg/dL — ABNORMAL HIGH (ref 65–99)
Potassium: 4.5 mmol/L (ref 3.5–5.2)
Sodium: 141 mmol/L (ref 134–144)

## 2019-04-06 LAB — SARS CORONAVIRUS 2 (TAT 6-24 HRS): SARS Coronavirus 2: NEGATIVE

## 2019-04-06 LAB — CBC
Hematocrit: 40.7 % (ref 34.0–46.6)
Hemoglobin: 13.3 g/dL (ref 11.1–15.9)
MCH: 29 pg (ref 26.6–33.0)
MCHC: 32.7 g/dL (ref 31.5–35.7)
MCV: 89 fL (ref 79–97)
Platelets: 253 10*3/uL (ref 150–450)
RBC: 4.59 x10E6/uL (ref 3.77–5.28)
RDW: 12.4 % (ref 11.7–15.4)
WBC: 10.5 10*3/uL (ref 3.4–10.8)

## 2019-04-09 ENCOUNTER — Telehealth: Payer: Self-pay | Admitting: *Deleted

## 2019-04-09 ENCOUNTER — Telehealth: Payer: Self-pay | Admitting: Cardiology

## 2019-04-09 ENCOUNTER — Encounter (HOSPITAL_COMMUNITY): Payer: Self-pay | Admitting: Cardiology

## 2019-04-09 NOTE — Telephone Encounter (Signed)
Patient has a heart cath procedure tomorrow and wanted to make Dr. Ellyn Hack aware that per her rheumatologist she has a blood clotting issue.

## 2019-04-09 NOTE — Anesthesia Preprocedure Evaluation (Addendum)
Anesthesia Evaluation  Patient identified by MRN, date of birth, ID band Patient awake    Reviewed: Allergy & Precautions, NPO status , Patient's Chart, lab work & pertinent test results, reviewed documented beta blocker date and time   Airway Mallampati: I  TM Distance: >3 FB Neck ROM: Full    Dental no notable dental hx. (+) Teeth Intact   Pulmonary shortness of breath, with exertion and at rest, asthma , sleep apnea and Continuous Positive Airway Pressure Ventilation , former smoker,  Non compliant with CPAP   Pulmonary exam normal breath sounds clear to auscultation       Cardiovascular + CAD and + Orthopnea  Normal cardiovascular exam+ dysrhythmias Ventricular Tachycardia + Valvular Problems/Murmurs MR and MVP  Rhythm:Regular Rate:Normal  Hx/o NSVT, on Metoprolol   Neuro/Psych Anxiety negative neurological ROS     GI/Hepatic Neg liver ROS, hiatal hernia, GERD  Medicated and Controlled,Diverticulosis Hx/o colon polyps Hx/o esophageal stricture s/p dilation   Endo/Other  Hypothyroidism Hx/o Hashimoto's thyroiditis s/p ablkation Obesity Hyperlipidemia  Renal/GU Renal InsufficiencyRenal disease Bladder dysfunction  SUI    Musculoskeletal  (+) Arthritis , Osteoarthritis,    Abdominal (+) + obese,   Peds  Hematology negative hematology ROS (+)   Anesthesia Other Findings   Reproductive/Obstetrics Hx/o VIN III S/P vulvectomy                         Anesthesia Physical Anesthesia Plan  ASA: III  Anesthesia Plan: MAC   Post-op Pain Management:    Induction: Intravenous  PONV Risk Score and Plan: Propofol infusion, Ondansetron and Treatment may vary due to age or medical condition  Airway Management Planned: Natural Airway and Nasal Cannula  Additional Equipment:   Intra-op Plan:   Post-operative Plan:   Informed Consent: I have reviewed the patients History and Physical, chart,  labs and discussed the procedure including the risks, benefits and alternatives for the proposed anesthesia with the patient or authorized representative who has indicated his/her understanding and acceptance.     Dental advisory given  Plan Discussed with: CRNA and Surgeon  Anesthesia Plan Comments:        Anesthesia Quick Evaluation

## 2019-04-09 NOTE — Telephone Encounter (Addendum)
Pt contacted pre-procedure for Tuesday April 10, 2019 10:30 AM Verified arrival time and place: [facility]) at: 7 AM/TEE 8 AM   Nothing to eat or drink after midnight, may have sips of water to take medications.  Contrast allergy: yes- 13 hour Prednisone and Benadryl Prep reviewed with patient: 04/09/19 9:30 PM Prednisone 50 mg 04/10/19 3:30 AM Prednisone 50 mg 04/10/19 AM of procedure just prior to leaving for hospital Prednisone 50 mg and Benadryl 50 Pt advised not to drive to hospital.  Hold: Lasix-day before and day of procedure-GFR 49, pt had already taken today  Except hold medications AM meds can be  taken pre-cath with sip of water including: ASA 81 mg Prednisone 50 mg Benadryl 50 mg  Confirmed patient has responsible adult to drive home post procedure and observe 24 hours after arriving home: yes  Currently, due to Covid-19 pandemic, only one person will be allowed with patient. Must be the same person for patient's entire stay and will be required to wear a mask. They will be asked to wait in the waiting room for the duration of the patient's stay.  Patients are required to wear a mask when they enter the hospital.      COVID-19 Pre-Screening Questions:  . In the past 7 to 10 days have you had a cough,  shortness of breath, headache, congestion, fever (100 or greater) body aches, chills, sore throat, or sudden loss of taste or sense of smell? no . Have you been around anyone with known Covid 19 in the past 7-10 days? no . Have you been around anyone who is awaiting Covid 19 test results in the past 7 to 10 days? no . Have you been around anyone who has been exposed to Covid 19, or has mentioned symptoms of Covid 19 within the past 7 to 10 days? No  I reviewed procedure/mask/visitor instructions, COVID-19 screening questions with patient, she verbalized understanding, thanked me for call.

## 2019-04-09 NOTE — Telephone Encounter (Signed)
Pt states she is under the care of Dr Valentino Nose, rheumatologist at [facility]) for  lupus and Sjogren's disease, and is being evaluated for clotting disorder, antiphospholipid antibody syndrome.  Pt wanted  Dr Ellyn Hack to have this information  prior to procedures 04/10/19.   Pt advised I will forward to Dr Ellyn Hack for his review.

## 2019-04-09 NOTE — Telephone Encounter (Signed)
Will route to MD to make aware, if any changes should be made.  Thanks!

## 2019-04-10 ENCOUNTER — Ambulatory Visit (HOSPITAL_COMMUNITY): Payer: Medicare Other

## 2019-04-10 ENCOUNTER — Inpatient Hospital Stay (HOSPITAL_COMMUNITY)
Admission: RE | Admit: 2019-04-10 | Discharge: 2019-04-26 | DRG: 216 | Disposition: A | Discharge status: Home Health | Payer: Medicare Other | Attending: Thoracic Surgery (Cardiothoracic Vascular Surgery) | Admitting: Thoracic Surgery (Cardiothoracic Vascular Surgery)

## 2019-04-10 ENCOUNTER — Other Ambulatory Visit: Payer: Self-pay | Admitting: *Deleted

## 2019-04-10 ENCOUNTER — Other Ambulatory Visit: Payer: Self-pay

## 2019-04-10 ENCOUNTER — Encounter (HOSPITAL_COMMUNITY)
Admission: RE | Disposition: A | Discharge status: Home Health | Payer: Self-pay | Source: Home / Self Care | Attending: Thoracic Surgery (Cardiothoracic Vascular Surgery)

## 2019-04-10 ENCOUNTER — Ambulatory Visit (HOSPITAL_COMMUNITY): Payer: Medicare Other | Admitting: Certified Registered Nurse Anesthetist

## 2019-04-10 ENCOUNTER — Inpatient Hospital Stay (HOSPITAL_COMMUNITY): Payer: Medicare Other

## 2019-04-10 ENCOUNTER — Encounter (HOSPITAL_COMMUNITY)
Admission: RE | Disposition: A | Discharge status: Home Health | Payer: Medicare Other | Source: Home / Self Care | Attending: Thoracic Surgery (Cardiothoracic Vascular Surgery)

## 2019-04-10 ENCOUNTER — Encounter (HOSPITAL_COMMUNITY): Payer: Self-pay | Admitting: Cardiology

## 2019-04-10 DIAGNOSIS — Z87891 Personal history of nicotine dependence: Secondary | ICD-10-CM

## 2019-04-10 DIAGNOSIS — K219 Gastro-esophageal reflux disease without esophagitis: Secondary | ICD-10-CM | POA: Diagnosis present

## 2019-04-10 DIAGNOSIS — E039 Hypothyroidism, unspecified: Secondary | ICD-10-CM | POA: Diagnosis present

## 2019-04-10 DIAGNOSIS — Z0189 Encounter for other specified special examinations: Secondary | ICD-10-CM

## 2019-04-10 DIAGNOSIS — Z8249 Family history of ischemic heart disease and other diseases of the circulatory system: Secondary | ICD-10-CM

## 2019-04-10 DIAGNOSIS — E875 Hyperkalemia: Secondary | ICD-10-CM | POA: Diagnosis not present

## 2019-04-10 DIAGNOSIS — Z7982 Long term (current) use of aspirin: Secondary | ICD-10-CM | POA: Diagnosis not present

## 2019-04-10 DIAGNOSIS — J9601 Acute respiratory failure with hypoxia: Secondary | ICD-10-CM | POA: Diagnosis not present

## 2019-04-10 DIAGNOSIS — J4520 Mild intermittent asthma, uncomplicated: Secondary | ICD-10-CM | POA: Diagnosis not present

## 2019-04-10 DIAGNOSIS — J9621 Acute and chronic respiratory failure with hypoxia: Secondary | ICD-10-CM | POA: Diagnosis not present

## 2019-04-10 DIAGNOSIS — I5033 Acute on chronic diastolic (congestive) heart failure: Secondary | ICD-10-CM | POA: Diagnosis not present

## 2019-04-10 DIAGNOSIS — Z9889 Other specified postprocedural states: Secondary | ICD-10-CM

## 2019-04-10 DIAGNOSIS — Z4659 Encounter for fitting and adjustment of other gastrointestinal appliance and device: Secondary | ICD-10-CM

## 2019-04-10 DIAGNOSIS — I517 Cardiomegaly: Secondary | ICD-10-CM | POA: Diagnosis not present

## 2019-04-10 DIAGNOSIS — Z79890 Hormone replacement therapy (postmenopausal): Secondary | ICD-10-CM | POA: Diagnosis not present

## 2019-04-10 DIAGNOSIS — M1990 Unspecified osteoarthritis, unspecified site: Secondary | ICD-10-CM | POA: Diagnosis present

## 2019-04-10 DIAGNOSIS — I110 Hypertensive heart disease with heart failure: Secondary | ICD-10-CM | POA: Diagnosis not present

## 2019-04-10 DIAGNOSIS — R739 Hyperglycemia, unspecified: Secondary | ICD-10-CM | POA: Diagnosis not present

## 2019-04-10 DIAGNOSIS — R0602 Shortness of breath: Secondary | ICD-10-CM | POA: Diagnosis present

## 2019-04-10 DIAGNOSIS — N179 Acute kidney failure, unspecified: Secondary | ICD-10-CM | POA: Diagnosis not present

## 2019-04-10 DIAGNOSIS — M3500 Sicca syndrome, unspecified: Secondary | ICD-10-CM | POA: Diagnosis present

## 2019-04-10 DIAGNOSIS — D6861 Antiphospholipid syndrome: Secondary | ICD-10-CM | POA: Diagnosis present

## 2019-04-10 DIAGNOSIS — I341 Nonrheumatic mitral (valve) prolapse: Secondary | ICD-10-CM | POA: Diagnosis not present

## 2019-04-10 DIAGNOSIS — R918 Other nonspecific abnormal finding of lung field: Secondary | ICD-10-CM | POA: Diagnosis not present

## 2019-04-10 DIAGNOSIS — I472 Ventricular tachycardia: Secondary | ICD-10-CM

## 2019-04-10 DIAGNOSIS — R221 Localized swelling, mass and lump, neck: Secondary | ICD-10-CM | POA: Diagnosis not present

## 2019-04-10 DIAGNOSIS — K011 Impacted teeth: Secondary | ICD-10-CM | POA: Diagnosis not present

## 2019-04-10 DIAGNOSIS — I4729 Other ventricular tachycardia: Secondary | ICD-10-CM

## 2019-04-10 DIAGNOSIS — Z79899 Other long term (current) drug therapy: Secondary | ICD-10-CM | POA: Diagnosis not present

## 2019-04-10 DIAGNOSIS — R570 Cardiogenic shock: Secondary | ICD-10-CM | POA: Diagnosis not present

## 2019-04-10 DIAGNOSIS — I340 Nonrheumatic mitral (valve) insufficiency: Secondary | ICD-10-CM

## 2019-04-10 DIAGNOSIS — K117 Disturbances of salivary secretion: Secondary | ICD-10-CM | POA: Diagnosis present

## 2019-04-10 DIAGNOSIS — J9690 Respiratory failure, unspecified, unspecified whether with hypoxia or hypercapnia: Secondary | ICD-10-CM

## 2019-04-10 DIAGNOSIS — F05 Delirium due to known physiological condition: Secondary | ICD-10-CM | POA: Diagnosis not present

## 2019-04-10 DIAGNOSIS — J439 Emphysema, unspecified: Secondary | ICD-10-CM | POA: Diagnosis present

## 2019-04-10 DIAGNOSIS — Z978 Presence of other specified devices: Secondary | ICD-10-CM

## 2019-04-10 DIAGNOSIS — Z91041 Radiographic dye allergy status: Secondary | ICD-10-CM

## 2019-04-10 DIAGNOSIS — I5032 Chronic diastolic (congestive) heart failure: Secondary | ICD-10-CM | POA: Diagnosis present

## 2019-04-10 DIAGNOSIS — J95821 Acute postprocedural respiratory failure: Secondary | ICD-10-CM | POA: Diagnosis not present

## 2019-04-10 DIAGNOSIS — D62 Acute posthemorrhagic anemia: Secondary | ICD-10-CM | POA: Diagnosis not present

## 2019-04-10 DIAGNOSIS — R0989 Other specified symptoms and signs involving the circulatory and respiratory systems: Secondary | ICD-10-CM | POA: Diagnosis not present

## 2019-04-10 DIAGNOSIS — I509 Heart failure, unspecified: Secondary | ICD-10-CM

## 2019-04-10 DIAGNOSIS — E861 Hypovolemia: Secondary | ICD-10-CM | POA: Diagnosis not present

## 2019-04-10 DIAGNOSIS — Z880 Allergy status to penicillin: Secondary | ICD-10-CM

## 2019-04-10 DIAGNOSIS — Z800 Family history of malignant neoplasm of digestive organs: Secondary | ICD-10-CM

## 2019-04-10 DIAGNOSIS — G4733 Obstructive sleep apnea (adult) (pediatric): Secondary | ICD-10-CM | POA: Diagnosis not present

## 2019-04-10 DIAGNOSIS — R7303 Prediabetes: Secondary | ICD-10-CM | POA: Diagnosis not present

## 2019-04-10 DIAGNOSIS — I059 Rheumatic mitral valve disease, unspecified: Secondary | ICD-10-CM | POA: Diagnosis not present

## 2019-04-10 DIAGNOSIS — E876 Hypokalemia: Secondary | ICD-10-CM | POA: Diagnosis not present

## 2019-04-10 DIAGNOSIS — E871 Hypo-osmolality and hyponatremia: Secondary | ICD-10-CM | POA: Diagnosis not present

## 2019-04-10 DIAGNOSIS — Z888 Allergy status to other drugs, medicaments and biological substances status: Secondary | ICD-10-CM | POA: Diagnosis not present

## 2019-04-10 DIAGNOSIS — Z01810 Encounter for preprocedural cardiovascular examination: Secondary | ICD-10-CM | POA: Diagnosis not present

## 2019-04-10 DIAGNOSIS — E785 Hyperlipidemia, unspecified: Secondary | ICD-10-CM | POA: Diagnosis not present

## 2019-04-10 DIAGNOSIS — I480 Paroxysmal atrial fibrillation: Secondary | ICD-10-CM | POA: Diagnosis not present

## 2019-04-10 DIAGNOSIS — M329 Systemic lupus erythematosus, unspecified: Secondary | ICD-10-CM | POA: Diagnosis present

## 2019-04-10 DIAGNOSIS — J431 Panlobular emphysema: Secondary | ICD-10-CM | POA: Diagnosis not present

## 2019-04-10 DIAGNOSIS — Z01818 Encounter for other preprocedural examination: Secondary | ICD-10-CM

## 2019-04-10 DIAGNOSIS — Z6837 Body mass index (BMI) 37.0-37.9, adult: Secondary | ICD-10-CM

## 2019-04-10 DIAGNOSIS — Z825 Family history of asthma and other chronic lower respiratory diseases: Secondary | ICD-10-CM

## 2019-04-10 DIAGNOSIS — K5900 Constipation, unspecified: Secondary | ICD-10-CM | POA: Diagnosis present

## 2019-04-10 DIAGNOSIS — D649 Anemia, unspecified: Secondary | ICD-10-CM | POA: Diagnosis not present

## 2019-04-10 DIAGNOSIS — K089 Disorder of teeth and supporting structures, unspecified: Secondary | ICD-10-CM

## 2019-04-10 DIAGNOSIS — J9811 Atelectasis: Secondary | ICD-10-CM | POA: Diagnosis not present

## 2019-04-10 DIAGNOSIS — Z952 Presence of prosthetic heart valve: Secondary | ICD-10-CM | POA: Diagnosis not present

## 2019-04-10 DIAGNOSIS — J449 Chronic obstructive pulmonary disease, unspecified: Secondary | ICD-10-CM | POA: Diagnosis present

## 2019-04-10 DIAGNOSIS — I493 Ventricular premature depolarization: Secondary | ICD-10-CM | POA: Diagnosis not present

## 2019-04-10 DIAGNOSIS — J90 Pleural effusion, not elsewhere classified: Secondary | ICD-10-CM | POA: Diagnosis not present

## 2019-04-10 DIAGNOSIS — Z4682 Encounter for fitting and adjustment of non-vascular catheter: Secondary | ICD-10-CM | POA: Diagnosis not present

## 2019-04-10 DIAGNOSIS — K1121 Acute sialoadenitis: Secondary | ICD-10-CM | POA: Diagnosis not present

## 2019-04-10 DIAGNOSIS — Z9911 Dependence on respirator [ventilator] status: Secondary | ICD-10-CM | POA: Diagnosis not present

## 2019-04-10 DIAGNOSIS — D6959 Other secondary thrombocytopenia: Secondary | ICD-10-CM | POA: Diagnosis not present

## 2019-04-10 DIAGNOSIS — I2510 Atherosclerotic heart disease of native coronary artery without angina pectoris: Secondary | ICD-10-CM | POA: Diagnosis not present

## 2019-04-10 DIAGNOSIS — J9622 Acute and chronic respiratory failure with hypercapnia: Secondary | ICD-10-CM | POA: Diagnosis not present

## 2019-04-10 DIAGNOSIS — R220 Localized swelling, mass and lump, head: Secondary | ICD-10-CM | POA: Diagnosis not present

## 2019-04-10 HISTORY — DX: Chronic obstructive pulmonary disease, unspecified: J44.9

## 2019-04-10 HISTORY — PX: BUBBLE STUDY: SHX6837

## 2019-04-10 HISTORY — DX: Antiphospholipid syndrome: D68.61

## 2019-04-10 HISTORY — PX: RIGHT/LEFT HEART CATH AND CORONARY ANGIOGRAPHY: CATH118266

## 2019-04-10 HISTORY — PX: TEE WITHOUT CARDIOVERSION: SHX5443

## 2019-04-10 HISTORY — DX: Coagulation defect, unspecified: D68.9

## 2019-04-10 LAB — POCT I-STAT EG7
Acid-Base Excess: 3 mmol/L — ABNORMAL HIGH (ref 0.0–2.0)
Acid-Base Excess: 3 mmol/L — ABNORMAL HIGH (ref 0.0–2.0)
Bicarbonate: 31.1 mmol/L — ABNORMAL HIGH (ref 20.0–28.0)
Bicarbonate: 31.2 mmol/L — ABNORMAL HIGH (ref 20.0–28.0)
Calcium, Ion: 1.21 mmol/L (ref 1.15–1.40)
Calcium, Ion: 1.22 mmol/L (ref 1.15–1.40)
HCT: 38 % (ref 36.0–46.0)
HCT: 39 % (ref 36.0–46.0)
Hemoglobin: 12.9 g/dL (ref 12.0–15.0)
Hemoglobin: 13.3 g/dL (ref 12.0–15.0)
O2 Saturation: 66 %
O2 Saturation: 67 %
Potassium: 4.3 mmol/L (ref 3.5–5.1)
Potassium: 4.4 mmol/L (ref 3.5–5.1)
Sodium: 142 mmol/L (ref 135–145)
Sodium: 142 mmol/L (ref 135–145)
TCO2: 33 mmol/L — ABNORMAL HIGH (ref 22–32)
TCO2: 33 mmol/L — ABNORMAL HIGH (ref 22–32)
pCO2, Ven: 62.9 mmHg — ABNORMAL HIGH (ref 44.0–60.0)
pCO2, Ven: 64 mmHg — ABNORMAL HIGH (ref 44.0–60.0)
pH, Ven: 7.294 (ref 7.250–7.430)
pH, Ven: 7.304 (ref 7.250–7.430)
pO2, Ven: 39 mmHg (ref 32.0–45.0)
pO2, Ven: 39 mmHg (ref 32.0–45.0)

## 2019-04-10 LAB — POCT I-STAT 7, (LYTES, BLD GAS, ICA,H+H)
Acid-Base Excess: 2 mmol/L (ref 0.0–2.0)
Bicarbonate: 29.5 mmol/L — ABNORMAL HIGH (ref 20.0–28.0)
Calcium, Ion: 1.22 mmol/L (ref 1.15–1.40)
HCT: 38 % (ref 36.0–46.0)
Hemoglobin: 12.9 g/dL (ref 12.0–15.0)
O2 Saturation: 98 %
Potassium: 4.3 mmol/L (ref 3.5–5.1)
Sodium: 141 mmol/L (ref 135–145)
TCO2: 31 mmol/L (ref 22–32)
pCO2 arterial: 58.1 mmHg — ABNORMAL HIGH (ref 32.0–48.0)
pH, Arterial: 7.314 — ABNORMAL LOW (ref 7.350–7.450)
pO2, Arterial: 116 mmHg — ABNORMAL HIGH (ref 83.0–108.0)

## 2019-04-10 SURGERY — RIGHT/LEFT HEART CATH AND CORONARY ANGIOGRAPHY
Anesthesia: LOCAL

## 2019-04-10 SURGERY — ECHOCARDIOGRAM, TRANSESOPHAGEAL
Anesthesia: Monitor Anesthesia Care

## 2019-04-10 MED ORDER — IOHEXOL 350 MG/ML SOLN
INTRAVENOUS | Status: AC
  Filled 2019-04-10: qty 1

## 2019-04-10 MED ORDER — PANTOPRAZOLE SODIUM 40 MG PO TBEC
40.0000 mg | DELAYED_RELEASE_TABLET | Freq: Every day | ORAL | Status: DC
  Administered 2019-04-11 – 2019-04-12 (×2): 40 mg via ORAL
  Filled 2019-04-10 (×2): qty 1

## 2019-04-10 MED ORDER — NICOTINE POLACRILEX 2 MG MT GUM
2.0000 mg | CHEWING_GUM | OROMUCOSAL | Status: DC | PRN
  Administered 2019-04-10: 2 mg via ORAL
  Filled 2019-04-10 (×2): qty 1

## 2019-04-10 MED ORDER — MIDAZOLAM HCL 2 MG/2ML IJ SOLN
INTRAMUSCULAR | Status: DC | PRN
  Administered 2019-04-10: 0.5 mg via INTRAVENOUS

## 2019-04-10 MED ORDER — SODIUM CHLORIDE 0.9% FLUSH: 3.0000 mL | INTRAVENOUS | Status: DC | PRN

## 2019-04-10 MED ORDER — FLUTICASONE PROPIONATE 50 MCG/ACT NA SUSP
2.0000 | Freq: Every day | NASAL | Status: DC
  Administered 2019-04-10 – 2019-04-12 (×3): 2 via NASAL
  Filled 2019-04-10: qty 16

## 2019-04-10 MED ORDER — HYDROXYCHLOROQUINE SULFATE 200 MG PO TABS
200.0000 mg | ORAL_TABLET | Freq: Two times a day (BID) | ORAL | Status: DC
  Administered 2019-04-10 – 2019-04-12 (×5): 200 mg via ORAL
  Filled 2019-04-10 (×7): qty 1

## 2019-04-10 MED ORDER — METOPROLOL TARTRATE 50 MG PO TABS
50.0000 mg | ORAL_TABLET | Freq: Every day | ORAL | Status: DC
  Administered 2019-04-10 – 2019-04-12 (×3): 50 mg via ORAL
  Filled 2019-04-10 (×3): qty 1

## 2019-04-10 MED ORDER — FUROSEMIDE 10 MG/ML IJ SOLN
40.0000 mg | Freq: Two times a day (BID) | INTRAMUSCULAR | Status: DC
  Filled 2019-04-10: qty 4

## 2019-04-10 MED ORDER — SODIUM CHLORIDE BACTERIOSTATIC 0.9 % IJ SOLN
INTRAMUSCULAR | Status: DC | PRN
  Administered 2019-04-10: 9 mL via INTRAVENOUS

## 2019-04-10 MED ORDER — FUROSEMIDE 10 MG/ML IJ SOLN
INTRAMUSCULAR | Status: AC
  Filled 2019-04-10: qty 8

## 2019-04-10 MED ORDER — FUROSEMIDE 10 MG/ML IJ SOLN
80.0000 mg | Freq: Two times a day (BID) | INTRAMUSCULAR | Status: DC
  Administered 2019-04-10: 80 mg via INTRAVENOUS

## 2019-04-10 MED ORDER — LIOTHYRONINE SODIUM 5 MCG PO TABS
5.0000 ug | ORAL_TABLET | Freq: Every day | ORAL | Status: DC
  Administered 2019-04-11 – 2019-04-14 (×3): 5 ug via ORAL
  Filled 2019-04-10 (×5): qty 1

## 2019-04-10 MED ORDER — VERAPAMIL HCL 2.5 MG/ML IV SOLN
INTRAVENOUS | Status: AC
  Filled 2019-04-10: qty 2

## 2019-04-10 MED ORDER — LIDOCAINE HCL (PF) 1 % IJ SOLN
INTRAMUSCULAR | Status: DC | PRN
  Administered 2019-04-10 (×2): 2 mL

## 2019-04-10 MED ORDER — METOPROLOL TARTRATE 12.5 MG HALF TABLET: 12.5000 mg | ORAL_TABLET | ORAL | Status: DC

## 2019-04-10 MED ORDER — PROPOFOL 10 MG/ML IV BOLUS
INTRAVENOUS | Status: DC | PRN
  Administered 2019-04-10 (×4): 10 mg via INTRAVENOUS

## 2019-04-10 MED ORDER — FUROSEMIDE 10 MG/ML IJ SOLN
80.0000 mg | Freq: Two times a day (BID) | INTRAMUSCULAR | Status: DC
  Administered 2019-04-10 – 2019-04-12 (×5): 80 mg via INTRAVENOUS
  Filled 2019-04-10 (×4): qty 8

## 2019-04-10 MED ORDER — SODIUM CHLORIDE 0.9 % WEIGHT BASED INFUSION
3.0000 mL/kg/h | INTRAVENOUS | Status: DC
  Administered 2019-04-10: 3 mL/kg/h via INTRAVENOUS

## 2019-04-10 MED ORDER — SPIRONOLACTONE 12.5 MG HALF TABLET
12.5000 mg | ORAL_TABLET | Freq: Every day | ORAL | Status: DC
  Administered 2019-04-10 – 2019-04-12 (×3): 12.5 mg via ORAL
  Filled 2019-04-10 (×3): qty 1

## 2019-04-10 MED ORDER — ONDANSETRON HCL 4 MG/2ML IJ SOLN: 4.0000 mg | Freq: Four times a day (QID) | INTRAMUSCULAR | Status: DC | PRN

## 2019-04-10 MED ORDER — METOPROLOL TARTRATE 12.5 MG HALF TABLET
12.5000 mg | ORAL_TABLET | Freq: Every morning | ORAL | Status: DC
  Administered 2019-04-11 – 2019-04-12 (×2): 12.5 mg via ORAL
  Filled 2019-04-10 (×2): qty 1

## 2019-04-10 MED ORDER — PHENYLEPHRINE 40 MCG/ML (10ML) SYRINGE FOR IV PUSH (FOR BLOOD PRESSURE SUPPORT)
PREFILLED_SYRINGE | INTRAVENOUS | Status: DC | PRN
  Administered 2019-04-10 (×3): 80 ug via INTRAVENOUS

## 2019-04-10 MED ORDER — LABETALOL HCL 5 MG/ML IV SOLN: 10.0000 mg | INTRAVENOUS | Status: AC | PRN

## 2019-04-10 MED ORDER — HEPARIN SODIUM (PORCINE) 1000 UNIT/ML IJ SOLN
INTRAMUSCULAR | Status: AC
  Filled 2019-04-10: qty 1

## 2019-04-10 MED ORDER — ATORVASTATIN CALCIUM 40 MG PO TABS
40.0000 mg | ORAL_TABLET | Freq: Every day | ORAL | Status: DC
  Administered 2019-04-11 – 2019-04-12 (×2): 40 mg via ORAL
  Filled 2019-04-10 (×2): qty 1

## 2019-04-10 MED ORDER — PREDNISONE 20 MG PO TABS: 50.0000 mg | ORAL_TABLET | Freq: Once | ORAL | Status: DC

## 2019-04-10 MED ORDER — MIDAZOLAM HCL 2 MG/2ML IJ SOLN
INTRAMUSCULAR | Status: AC
  Filled 2019-04-10: qty 2

## 2019-04-10 MED ORDER — LIDOCAINE HCL (PF) 1 % IJ SOLN
INTRAMUSCULAR | Status: AC
  Filled 2019-04-10: qty 30

## 2019-04-10 MED ORDER — ASPIRIN EC 81 MG PO TBEC
81.0000 mg | DELAYED_RELEASE_TABLET | Freq: Every day | ORAL | Status: DC
  Administered 2019-04-11 – 2019-04-12 (×2): 81 mg via ORAL
  Filled 2019-04-10 (×2): qty 1

## 2019-04-10 MED ORDER — AZELASTINE HCL 0.1 % NA SOLN
2.0000 | Freq: Every day | NASAL | Status: DC
  Administered 2019-04-10 – 2019-04-12 (×3): 2 via NASAL
  Filled 2019-04-10: qty 30

## 2019-04-10 MED ORDER — HYDRALAZINE HCL 20 MG/ML IJ SOLN: 10.0000 mg | INTRAMUSCULAR | Status: AC | PRN

## 2019-04-10 MED ORDER — FLUOXETINE HCL 20 MG PO CAPS
20.0000 mg | ORAL_CAPSULE | Freq: Every day | ORAL | Status: DC
  Administered 2019-04-10 – 2019-04-12 (×3): 20 mg via ORAL
  Filled 2019-04-10 (×3): qty 1

## 2019-04-10 MED ORDER — ACETAMINOPHEN 325 MG PO TABS: 650.0000 mg | ORAL_TABLET | ORAL | Status: DC | PRN

## 2019-04-10 MED ORDER — PROPOFOL 500 MG/50ML IV EMUL
INTRAVENOUS | Status: DC | PRN
  Administered 2019-04-10: 100 ug/kg/min via INTRAVENOUS

## 2019-04-10 MED ORDER — SODIUM CHLORIDE 0.9% FLUSH
10.0000 mL | Freq: Two times a day (BID) | INTRAVENOUS | Status: DC
  Administered 2019-04-10 – 2019-04-12 (×5): 10 mL

## 2019-04-10 MED ORDER — SODIUM CHLORIDE 0.9% FLUSH: 10.0000 mL | INTRAVENOUS | Status: DC | PRN

## 2019-04-10 MED ORDER — BUTAMBEN-TETRACAINE-BENZOCAINE 2-2-14 % EX AERO
INHALATION_SPRAY | CUTANEOUS | Status: DC | PRN
  Administered 2019-04-10: 2 via TOPICAL

## 2019-04-10 MED ORDER — LIDOCAINE 2% (20 MG/ML) 5 ML SYRINGE
INTRAMUSCULAR | Status: DC | PRN
  Administered 2019-04-10: 20 mg via INTRAVENOUS

## 2019-04-10 MED ORDER — SODIUM CHLORIDE 0.9 % IV SOLN: INTRAVENOUS | Status: AC

## 2019-04-10 MED ORDER — ALBUTEROL SULFATE (2.5 MG/3ML) 0.083% IN NEBU: 2.5000 mg | INHALATION_SOLUTION | Freq: Four times a day (QID) | RESPIRATORY_TRACT | Status: DC | PRN

## 2019-04-10 MED ORDER — HEPARIN (PORCINE) IN NACL 1000-0.9 UT/500ML-% IV SOLN
INTRAVENOUS | Status: AC
  Filled 2019-04-10: qty 1000

## 2019-04-10 MED ORDER — IOHEXOL 350 MG/ML SOLN
INTRAVENOUS | Status: DC | PRN
  Administered 2019-04-10: 30 mL

## 2019-04-10 MED ORDER — HEPARIN (PORCINE) IN NACL 1000-0.9 UT/500ML-% IV SOLN
INTRAVENOUS | Status: DC | PRN
  Administered 2019-04-10 (×2): 500 mL

## 2019-04-10 MED ORDER — ONDANSETRON HCL 4 MG/2ML IJ SOLN: 4.0000 mg | Freq: Once | INTRAMUSCULAR | Status: DC | PRN

## 2019-04-10 MED ORDER — LEVOTHYROXINE SODIUM 25 MCG PO TABS
125.0000 ug | ORAL_TABLET | Freq: Every day | ORAL | Status: DC
  Administered 2019-04-11 – 2019-04-14 (×3): 125 ug via ORAL
  Filled 2019-04-10 (×4): qty 1

## 2019-04-10 MED ORDER — FENTANYL CITRATE (PF) 100 MCG/2ML IJ SOLN: 25.0000 ug | INTRAMUSCULAR | Status: DC | PRN

## 2019-04-10 MED ORDER — VERAPAMIL HCL 2.5 MG/ML IV SOLN
INTRAVENOUS | Status: DC | PRN
  Administered 2019-04-10: 10 mL via INTRA_ARTERIAL

## 2019-04-10 MED ORDER — SODIUM CHLORIDE 0.9 % IV SOLN: 250.0000 mL | INTRAVENOUS | Status: DC | PRN

## 2019-04-10 MED ORDER — SODIUM CHLORIDE 0.9 % WEIGHT BASED INFUSION
1.0000 mL/kg/h | INTRAVENOUS | Status: DC
  Administered 2019-04-10: 1 mL/kg/h via INTRAVENOUS

## 2019-04-10 MED ORDER — SODIUM CHLORIDE 0.9 % IV SOLN: INTRAVENOUS | Status: DC | PRN

## 2019-04-10 MED ORDER — SODIUM CHLORIDE 0.9% FLUSH
3.0000 mL | Freq: Two times a day (BID) | INTRAVENOUS | Status: DC
  Administered 2019-04-10 – 2019-04-12 (×5): 3 mL via INTRAVENOUS

## 2019-04-10 MED ORDER — HEPARIN SODIUM (PORCINE) 1000 UNIT/ML IJ SOLN
INTRAMUSCULAR | Status: DC | PRN
  Administered 2019-04-10: 5000 [IU] via INTRAVENOUS

## 2019-04-10 MED ORDER — ENOXAPARIN SODIUM 40 MG/0.4ML ~~LOC~~ SOLN
40.0000 mg | SUBCUTANEOUS | Status: DC
  Administered 2019-04-11 – 2019-04-12 (×2): 40 mg via SUBCUTANEOUS
  Filled 2019-04-10 (×2): qty 0.4

## 2019-04-10 SURGICAL SUPPLY — 14 items
CATH BALLN WEDGE 5F 110CM (CATHETERS) ×1 IMPLANT
CATH INFINITI 5FR ANG PIGTAIL (CATHETERS) ×1 IMPLANT
CATH OPTITORQUE TIG 4.0 5F (CATHETERS) ×1 IMPLANT
DEVICE RAD COMP TR BAND LRG (VASCULAR PRODUCTS) ×1 IMPLANT
GLIDESHEATH SLEND SS 6F .021 (SHEATH) ×1 IMPLANT
GUIDEWIRE .025 260CM (WIRE) ×1 IMPLANT
GUIDEWIRE INQWIRE 1.5J.035X260 (WIRE) IMPLANT
INQWIRE 1.5J .035X260CM (WIRE) ×2
KIT HEART LEFT (KITS) ×2 IMPLANT
PACK CARDIAC CATHETERIZATION (CUSTOM PROCEDURE TRAY) ×2 IMPLANT
SHEATH GLIDE SLENDER 4/5FR (SHEATH) ×1 IMPLANT
SHEATH PROBE COVER 6X72 (BAG) ×1 IMPLANT
TRANSDUCER W/STOPCOCK (MISCELLANEOUS) ×2 IMPLANT
TUBING CIL FLEX 10 FLL-RA (TUBING) ×2 IMPLANT

## 2019-04-10 NOTE — CV Procedure (Signed)
    PROCEDURE NOTE:  Procedure:  Transesophageal echocardiogram Operator:  Fransico Him, MD Indications:  Severe MR Complications: None  During this procedure the patient is administered a total of Propofol 270 mg to achieve and maintain moderate conscious sedation by anesthesia.  The patient's heart rate, blood pressure, and oxygen saturation are monitored continuously during the procedure.   Results: Normal LV size and function Normal RV size and mildly reduced RVF. Severely dilated RA Severely dilated LA with normal LA appendage Normal TV with mild TR Normal PV with trivial PR There is myoxmatous proliferation of the posterior mitral valve leaflet with flail P2 component of the posterior mitral valve leaflet with associated severe eccentric mitral regurgitation directed towards the septum that wraps around the left atrium into the mouth of the LA appendage. Normal trileaflet AV with mildly aortic valve sclerosis with no stenosis and trivial AR Hypermobile interatrial septum with PFO by agitated saline contrast injection.  Mild atherosclerosis of the  thoracic and ascending aorta.  The patient tolerated the procedure well and was transferred back to their room in stable condition.  Signed: Fransico Him, MD [facility]

## 2019-04-10 NOTE — Progress Notes (Unsigned)
Pre-op MVR testing ordered and consult give to Dr. Roxy Manns.

## 2019-04-10 NOTE — Progress Notes (Addendum)
Echocardiogram Transesophageal echocardiogram has been performed.  Oneal Deputy Jamicah Anstead 04/10/2019, 8:55 AM

## 2019-04-10 NOTE — Interval H&P Note (Signed)
History and Physical Interval Note:  04/10/2019 7:55 AM  Rebecca Carr  has presented today for surgery, with the diagnosis of Golden.  The various methods of treatment have been discussed with the patient and family. After consideration of risks, benefits and other options for treatment, the patient has consented to  Procedure(s): TRANSESOPHAGEAL ECHOCARDIOGRAM (TEE) (N/A) as a surgical intervention.  The patient's history has been reviewed, patient examined, no change in status, stable for surgery.  I have reviewed the patient's chart and labs.  Questions were answered to the patient's satisfaction.     Fransico Him

## 2019-04-10 NOTE — Anesthesia Postprocedure Evaluation (Signed)
Anesthesia Post Note  Patient: Rebecca Carr  Procedure(s) Performed: TRANSESOPHAGEAL ECHOCARDIOGRAM (TEE) (N/A ) BUBBLE STUDY     Patient location during evaluation: PACU Anesthesia Type: MAC Level of consciousness: awake and alert and oriented Pain management: pain level controlled Vital Signs Assessment: post-procedure vital signs reviewed and stable Respiratory status: spontaneous breathing, nonlabored ventilation and respiratory function stable Cardiovascular status: stable and blood pressure returned to baseline Postop Assessment: no apparent nausea or vomiting Anesthetic complications: no    Last Vitals:  Vitals:   04/10/19 0849 04/10/19 0859  BP: (!) 106/54 (!) 104/56  Pulse: 70 69  Resp: 16 (!) 25  Temp: 36.6 C   SpO2: 93% 97%    Last Pain:  Vitals:   04/10/19 0859  TempSrc:   PainSc: 5                  [name] A.

## 2019-04-10 NOTE — Consult Note (Addendum)
Advanced Heart Failure Team Consult Note   Primary Physician: Aurea Graff, PA-C PCP-Cardiologist:  Dr Juleen China Pulmonary: Dr Annamaria Boots   Reason for Consultation: A/C Diastolic Heart Failure   HPI:    Rebecca Carr is seen today for evaluation of acute/chronic diastolic heart failure and severe Rebecca at the request of Dr Ellyn Hack    Rebecca Carr is a 69 year old with history of PVCs, OSA, chronic diastolic heart failure, and chronic coronary artery calcification, moderate - severe mitral regurgitation.  Followed by Dr Ellyn Hack in the community and was last seen 03/28/19. ECHO prior to the appointment showed EF 60-65%, grade II DD, and mitral prolapse and severe Rebecca.   During the appointment she described frequent palpitations, tachycardia, and dyspnea with exertion. Started on lasix  40 mg daily and BB was increased. She was set up for TEE and cath.   Today she presented for TEE and RHC/LHC for mitral regurgitation work up. TEE with showed severe Rebecca and P1/2 prolapse. CT surgery consulted.   At baseline she has been independent. Living at home with her son and husband. Now SOB with minimal exertion and at rest with significant LE edema. She was a smoker. Quit 4 years ago.   LHC/RHC 04/10/19 Normal Coronaries  RA 17 PA 67/27 (43)  PCWP 33 (v=62) Fick CO 5.2 Fick CI 2.37  03/12/19-EF 60-65% , grade II DD, severe Rebecca, significant mitral valve prolapse.   03/12/19 Stress Test- low risk study, EF > 65%    Review of Systems: [y] = yes, _0  = no   . General: Weight gain [Y ]; Weight loss _1 ; Anorexia _2 ; Fatigue [Y ]; Fever _3 ; Chills _4 ; Weakness [Y ]  . Cardiac: Chest pain/pressure _5 ; Resting SOB [Y ]; Exertional SOB [ Y]; Orthopnea [Y]; Pedal Edema [Y ]; Palpitations _6 ; Syncope _7 ; Presyncope _8 ; Paroxysmal nocturnal dyspnea[Y ]  . Pulmonary: Cough _9 ; Wheezing_10 ; Hemoptysis_11 ; Sputum _12 ; Snoring [Y ]  . GI: Vomiting_13 ; Dysphagia_14 ; Melena_15 ; Hematochezia _16 ; Heartburn[Y  ]; Abdominal pain _17 ; Constipation _18 ; Diarrhea _19 ; BRBPR _20   . GU: Hematuria_21 ; Dysuria _22 ; Nocturia_23   . Vascular: Pain in legs with walking _24 ; Pain in feet with lying flat _25 ; Non-healing sores _26 ; Stroke _27 ; TIA _28 ; Slurred speech _29 ;  . Neuro: Headaches_30 ; Vertigo_31 ; Seizures_32 ; Paresthesias_33 ;Blurred vision _34 ; Diplopia _35 ; Vision changes _36   . Ortho/Skin: Arthritis Jazmín.Cullens ]; Joint pain [ Y]; Muscle pain _37 ; Joint swelling _38 ; Back Pain _39 ; Rash _40   . Psych: Depression[Y ]; Anxiety[Y ]  . Heme: Bleeding problems _41 ; Clotting disorders _42 ; Anemia _43   . Endocrine: Diabetes _44 ; Thyroid dysfunction_45   Home Medications Prior to Admission medications   Medication Sig Start Date End Date Taking? Authorizing Provider  ALBUTEROL IN Inhale 1-2 puffs into the lungs every 6 (six) hours as needed.    Yes [provider]  aspirin EC 81 MG tablet Take 81 mg by mouth at bedtime. Reported on 07/16/2015   Yes [provider]  atorvastatin (LIPITOR) 40 MG tablet Take 40 mg by mouth at bedtime.    Yes [provider]  azelastine (ASTELIN) 0.1 % nasal spray Place 2 sprays into both nostrils at bedtime.  07/14/16  Yes [provider]  Cyanocobalamin (VITAMIN B-12) 5000 MCG LOZG Take 5,000 mcg by mouth daily.   Yes [provider]  FLUoxetine (PROZAC) 20 MG capsule Take 20 mg by mouth daily at 6 PM. (1700)   Yes [provider]  fluticasone (FLONASE) 50 MCG/ACT nasal spray Place 2 sprays into both nostrils at bedtime.    Yes [provider]  furosemide (LASIX) 40 MG tablet TAKE 20 MG TO 40 MG TWICE A DAY AS NEEDED FOR SWELLING Patient taking differently: Take 20 mg by mouth 2 (two) times daily as needed (swelling.).  03/28/19  Yes Leonie Man, MD  hydroxychloroquine (PLAQUENIL) 200 MG tablet Take 200 mg by mouth 2 (two) times daily.  07/03/18  Yes [provider]  Javier Docker Oil 500 MG CAPS Take 500 mg by mouth daily.    Yes [provider]  levothyroxine (SYNTHROID) 125 MCG tablet Take 125 mcg by mouth daily before breakfast.   Yes [provider]  liothyronine (CYTOMEL) 5 MCG tablet Take 5 mcg by mouth daily. 07/07/17  Yes [provider]  metoprolol tartrate (LOPRESSOR) 25 MG tablet TAKE 1/2 TABLET (12.5 MG) IN THE MORNING AND 2 TABLETS (50 MG) AT NIGHT. Patient taking differently: Take 12.5-50 mg by mouth See admin instructions. TAKE 1/2 TABLET (12.5 MG) IN THE MORNING AND 2 TABLETS (50 MG) AT NIGHT. 03/28/19  Yes Leonie Man, MD  Multiple Vitamin (MULTIVITAMIN WITH MINERALS) TABS tablet Take 1 tablet by mouth daily. Multivitamin Essentials   Yes [provider]  neomycin-bacitracin-polymyxin (NEOSPORIN) ointment Apply 1 application topically as needed for wound care (applied to affected area of foot.).   Yes [provider]  nicotine polacrilex (NICORETTE) 2 MG gum Take 2 mg by mouth as needed for smoking cessation.   Yes [provider]  omeprazole (PRILOSEC) 40 MG capsule TAKE 1 CAPSULE(40 MG) BY MOUTH TWICE DAILY Patient taking differently: Take 40 mg by mouth daily before breakfast.  02/05/19  Yes Ladene Artist, MD  predniSONE (DELTASONE) 50 MG tablet Contrast Allergy: Yes, Please take Prednisone 49m by mouth at: Thirteen hours prior to cath 9:00pm on Monday,Seven hours prior to cath 3:00am on Tuesday And prior to leaving home please take last dose of Prednisone 525mand Benadryl 5043my mouth. 6 AM 03/28/19  Yes HarLeonie ManD    Past Medical History: Past Medical History:  Diagnosis Date  . Anxiety   . Arthritis   . DES exposure in utero   . Diverticulosis of colon   . Dizziness   . Environmental and seasonal allergies   . GERD (gastroesophageal reflux disease)   . Hashimoto's thyroiditis   . Heart palpitations   . Hiatal hernia   . History of adenomatous polyp of colon    hyperplastic 2015  and tubular adenoma 2005  . History  of esophageal dilatation    of stricture  . History of esophagitis    LA class , grade B and Gastritis  . History of pituitary tumor    dx 1990's - asymptomatic /  per pt last scan 2005 approx.  resolved   . Hyperlipidemia   . Hypothyroidism   . Leg pain, bilateral   . Mild intermittent asthma   . Moderate mitral regurgitation by prior echocardiography 2012   1-2 Rebecca on Echo  . [place] MVP (mitral valve prolapse)    Mild calcified posterior leaflet prolapse on echo July 2018.  Mild Rebecca.  . OSA (obstructive sleep  apnea)    non-compliant cpap  . Prediabetes   . SUI (stress urinary incontinence, female)   . VIN III (vulvar intraepithelial neoplasia III)   . Wears glasses     Past Surgical History: Past Surgical History:  Procedure Laterality Date  . CARDIAC EVENT MONITOR  July-August 2018   Normal sinus rhythm with sinus tachycardia.  Average heart rate 91 bpm.  Occasional PVCs.  One 12 and another 6 beat run of wide-complex tachycardia.  2 morphology PVCs.PVCs noted, but not during longer episode of WCT.  Marland Kitchen CESAREAN SECTION  1994  . CO2 LASER APPLICATION N/A 03/12/5425   Procedure: CO2 LASER APPLICATION ;  Surgeon: Everitt Amber, MD;  Location: [facility];  Service: Gynecology;  Laterality: N/A;  . COLONOSCOPY, ESOPHAGOGASTRODUODENOSCOPY (EGD) AND ESOPHAGEAL DILATION  10-04-2013  . LAPAROSCOPIC CHOLECYSTECTOMY  1995  . LASER ABLATION CONDOLAMATA N/A 07/03/2015   Procedure: CO2 LASER OF VULVA ;  Surgeon: Everitt Amber, MD;  Location: [facility];  Service: Gynecology;  Laterality: N/A;  . NM MYOVIEW LTD  10/2016   LOW RISK.  No ischemia or infarction.  EF 54%.  . TRANSTHORACIC ECHOCARDIOGRAM  11/2010   Normal LV Size & Fxn - EF 55-60%. Mild-Mod Rebecca. Normal DF.  Marland Kitchen TRANSTHORACIC ECHOCARDIOGRAM  08/2016   EF 60 of 65%.  Severe LVH.  Pseudo-normal filling (grade 2 diastolic dysfunction).  MAC with calcified mobile attachments.  Posterior leaflet  prolapse.  Mild regurgitation.  Moderate LA dilation.    Family History: Family History  Problem Relation Age of Onset  . Pancreatic cancer Father   . Colon cancer Father   . Heart attack Mother   . Heart disease Mother   . Heart attack Brother   . COPD Brother   . Dementia Brother   . Colon cancer Maternal Grandfather   . Rectal cancer Neg Hx   . Stomach cancer Neg Hx   . Esophageal cancer Neg Hx     Social History: Social History   Socioeconomic History  . Marital status: Married    Spouse name: Jenny Reichmann  . Number of children: 1  . Years of education: Not on file  . Highest education level: Not on file  Occupational History  . Occupation: CUSTOMER SERVICE    Employer: [employer]    Comment: Is about to retire  Tobacco Use  . Smoking status: Former Smoker    Packs/day: 1.00    Years: 48.00    Pack years: 48.00    Types: Cigarettes    Quit date: 05/10/2014    Years since quitting: 4.9  . Smokeless tobacco: Never Used  . Tobacco comment: using nicorrette  Substance and Sexual Activity  . Alcohol use: Yes    Alcohol/week: 2.0 standard drinks    Types: 2 Standard drinks or equivalent per week    Comment: Social - > is now essentially quit  . Drug use: No  . Sexual activity: Not on file  Other Topics Concern  . Not on file  Social History Narrative   No routine exercise. Describes herself as "lazy "   Social Determinants of Health   Financial Resource Strain:   . Difficulty of Paying Living Expenses: Not on file  Food Insecurity:   . Worried About Charity fundraiser in the Last Year: Not on file  . Ran Out of Food in the Last Year: Not on file  Transportation Needs:   . Lack of Transportation (Medical): Not on file  .  Lack of Transportation (Non-Medical): Not on file  Physical Activity:   . Days of Exercise per Week: Not on file  . Minutes of Exercise per Session: Not on file  Stress:   . Feeling of Stress : Not on file  Social Connections:   .  Frequency of Communication with Friends and Family: Not on file  . Frequency of Social Gatherings with Friends and Family: Not on file  . Attends Religious Services: Not on file  . Active Member of Clubs or Organizations: Not on file  . Attends Archivist Meetings: Not on file  . Marital Status: Not on file    Allergies:  Allergies  Allergen Reactions  . Iodine Anaphylaxis  . Iohexol Anaphylaxis     Desc: Patient states she is allergic to iodine and had a "code blue" incident after an injection for some type of imaging study.   . Penicillins Rash    PCN IN LARGE DOSES. Did it involve swelling of the face/tongue/throat, SOB, or low BP? No Did it involve sudden or severe rash/hives, skin peeling, or any reaction on the inside of your mouth or nose? No Did you need to seek medical attention at a hospital or doctor's office? No When did it last happen? More than 10 years ago If all above answers are "NO", may proceed with cephalosporin use.     Objective:    Vital Signs:   Temp:  [97.9 F (36.6 C)-98.5 F (36.9 C)] 97.9 F (36.6 C) (03/02 0849) Pulse Rate:  [0-74] 68 (03/02 1158) Resp:  [11-32] 32 (03/02 1158) BP: (101-116)/(50-68) 104/50 (03/02 1158) SpO2:  [0 %-100 %] 92 % (03/02 1158) Weight:  [108.9 kg] 108.9 kg (03/02 0739)    Weight change: Filed Weights   04/10/19 0739  Weight: 108.9 kg    Intake/Output:   Intake/Output Summary (Last 24 hours) at 04/10/2019 1201 Last data filed at 04/10/2019 0846 Gross per 24 hour  Intake 400 ml  Output --  Net 400 ml      Physical Exam    General:   No resp difficulty. Hoarse voice HEENT: normal Neck: supple. JVP to jaw.  Carotids 2+ bilat; no bruits. No lymphadenopathy or thyromegaly appreciated. Cor: PMI nondisplaced. Regular rate & rhythm. No rubs, gallops . 2/6 Rebecca Lungs: clear with decreased BS Abdomen: obese soft, nontender, nondistended. No hepatosplenomegaly. No bruits or masses. Good bowel  sounds. Extremities: no cyanosis, clubbing, rash, 2-3+ edema Neuro: alert & orientedx3, cranial nerves grossly intact. moves all 4 extremities w/o difficulty. Affect pleasant   Telemetry   NSR 70s Personally reviewed EKG    NSR 74 bpm No ST-T wave abnormalities. Personally reviewed   Labs   Basic Metabolic Panel: Recent Labs  Lab 04/06/19 1146  NA 141  K 4.5  CL 99  CO2 28  GLUCOSE 104*  BUN 19  CREATININE 1.15*  CALCIUM 9.5    Liver Function Tests: No results for input(s): AST, ALT, ALKPHOS, BILITOT, PROT, ALBUMIN in the last 168 hours. No results for input(s): LIPASE, AMYLASE in the last 168 hours. No results for input(s): AMMONIA in the last 168 hours.  CBC: Recent Labs  Lab 04/06/19 1146  WBC 10.5  HGB 13.3  HCT 40.7  MCV 89  PLT 253    Cardiac Enzymes: No results for input(s): CKTOTAL, CKMB, CKMBINDEX, TROPONINI in the last 168 hours.  BNP: BNP (last 3 results) No results for input(s): BNP in the last 8760 hours.  ProBNP (last 3  results) No results for input(s): PROBNP in the last 8760 hours.   CBG: No results for input(s): GLUCAP in the last 168 hours.  Coagulation Studies: No results for input(s): LABPROT, INR in the last 72 hours.   Imaging    No results found.   Medications:     Current Medications:   Infusions: . sodium chloride 20 mL/hr (04/10/19 1125)        Assessment/Plan    1. A/C Diastolic Heart Failure ECHO 03/2019 EF 60-65% grade II DD  -Elevated filling pressures on cath. Start IV lasix to tune up for possible valve surgery.  - Follow daily BMET.   2. Mitral Regurgitation  -ECHO/TEE with severe Rebecca P2 prolapse -CT surgery consulted.   3.OSA Followed by Dr Annamaria Boots. Hasnt used CPAP in 1 year.     Length of Stay: 0  Darrick Grinder, NP  04/10/2019, 12:01 PM  Advanced Heart Failure Team Pager [phone] (M-F; 7a - 4p)  Please contact [facility] for night-coverage after hours (4p -7a ) and weekends on  [email or web address]  Patient seen and examined with the above-signed Advanced Practice Provider and/or Housestaff. I personally reviewed laboratory data, imaging studies and relevant notes. I independently examined the patient and formulated the important aspects of the plan. I have edited the note to reflect any of my changes or salient points. I have personally discussed the plan with the patient and/or family.  69 y/o former smoke with h/o obesity, OSA and diastolic HF. Underwent TEE and R/L cath today for further evaluation of progressive HF symptoms in the setting of severe Rebecca.   All studies reviewed personally. Cath with normal coronaries and markedly elevated filling pressures with PCWP 33 (v to 62) CVP 17 PVR 2.0. PaPI 2.4. TEE with nromal LV and RV function. Myxomatous MY with severe P1/P2 prolapse and severe Rebecca  On exam  General:  Hoarse voice. Anxious. HEENT: normal Neck: supple. JVP to ear Carotids 2+ bilat; no bruits. No lymphadenopathy or thryomegaly appreciated. Cor: PMI nondisplaced. Regular rate & rhythm.2/6 Rebecca at apex Lungs: basilar crackles  Abdomen: soft, nontender, nondistended. No hepatosplenomegaly. No bruits or masses. Good bowel sounds. Extremities: no cyanosis, clubbing, rash, 2-3+ edema Neuro: alert & orientedx3, cranial nerves grossly intact. moves all 4 extremities w/o difficulty. Affect pleasant  She has severe Rebecca with markedly elevated filling pressures. Dr. Roxy Manns has been consulted for possible MV repair. She will need aggressive diuresis as well as pre-op PFTs and vascular studies and dental exam. Agree with lasix 80 iv bid. Start spiro. Await Dr. Guy Sandifer eval.   Glori Bickers, MD  6:14 PM

## 2019-04-10 NOTE — Transfer of Care (Signed)
Immediate Anesthesia Transfer of Care Note  Patient: Rebecca Carr  Procedure(s) Performed: TRANSESOPHAGEAL ECHOCARDIOGRAM (TEE) (N/A ) BUBBLE STUDY  Patient Location: Endoscopy Unit  Anesthesia Type:MAC  Level of Consciousness: drowsy  Airway & Oxygen Therapy: Patient Spontanous Breathing and Patient connected to nasal cannula oxygen  Post-op Assessment: Report given to RN and Post -op Vital signs reviewed and stable  Post vital signs: Reviewed  Last Vitals:  Vitals Value Taken Time  BP 106/54 04/10/19 0849  Temp    Pulse 71 04/10/19 0851  Resp 35 04/10/19 0851  SpO2 94 % 04/10/19 0851  Vitals shown include unvalidated device data.  Last Pain:  Vitals:   04/10/19 0739  TempSrc: Oral  PainSc: 0-No pain         Complications: No apparent anesthesia complications

## 2019-04-10 NOTE — Anesthesia Procedure Notes (Signed)
Procedure Name: MAC Date/Time: 04/10/2019 8:12 AM Performed by: Janene Harvey, CRNA Pre-anesthesia Checklist: Patient identified, Emergency Drugs available, Suction available and Patient being monitored Oxygen Delivery Method: Nasal cannula Dental Injury: Teeth and Oropharynx as per pre-operative assessment

## 2019-04-10 NOTE — Telephone Encounter (Signed)
We talked about that today.   dh

## 2019-04-10 NOTE — Interval H&P Note (Signed)
History and Physical Interval Note:  04/10/2019 10:39 AM  Rebecca Carr  has presented today for surgery, with the diagnosis of Pre Op, mitrial regurgitation.  The various methods of treatment have been discussed with the patient and family. After consideration of risks, benefits and other options for treatment, the patient has consented to  Procedure(s): RIGHT/LEFT HEART CATH AND CORONARY ANGIOGRAPHY (N/A) as a surgical intervention.  The patient's history has been reviewed, patient examined, no change in status, stable for surgery.  I have reviewed the patient's chart and labs.  Questions were answered to the patient's satisfaction.     Glenetta Hew

## 2019-04-11 ENCOUNTER — Inpatient Hospital Stay (HOSPITAL_COMMUNITY): Payer: Medicare Other

## 2019-04-11 DIAGNOSIS — I341 Nonrheumatic mitral (valve) prolapse: Secondary | ICD-10-CM

## 2019-04-11 DIAGNOSIS — I340 Nonrheumatic mitral (valve) insufficiency: Secondary | ICD-10-CM

## 2019-04-11 LAB — CBC WITH DIFFERENTIAL/PLATELET
Abs Immature Granulocytes: 0.06 10*3/uL (ref 0.00–0.07)
Basophils Absolute: 0 10*3/uL (ref 0.0–0.1)
Basophils Relative: 0 %
Eosinophils Absolute: 0 10*3/uL (ref 0.0–0.5)
Eosinophils Relative: 0 %
HCT: 40.3 % (ref 36.0–46.0)
Hemoglobin: 12.2 g/dL (ref 12.0–15.0)
Immature Granulocytes: 0 %
Lymphocytes Relative: 13 %
Lymphs Abs: 1.9 10*3/uL (ref 0.7–4.0)
MCH: 29 pg (ref 26.0–34.0)
MCHC: 30.3 g/dL (ref 30.0–36.0)
MCV: 95.7 fL (ref 80.0–100.0)
Monocytes Absolute: 1 10*3/uL (ref 0.1–1.0)
Monocytes Relative: 7 %
Neutro Abs: 11.7 10*3/uL — ABNORMAL HIGH (ref 1.7–7.7)
Neutrophils Relative %: 80 %
Platelets: 201 10*3/uL (ref 150–400)
RBC: 4.21 MIL/uL (ref 3.87–5.11)
RDW: 13.7 % (ref 11.5–15.5)
WBC: 14.7 10*3/uL — ABNORMAL HIGH (ref 4.0–10.5)
nRBC: 0 % (ref 0.0–0.2)

## 2019-04-11 LAB — PULMONARY FUNCTION TEST
DL/VA % pred: 135 %
DL/VA: 5.48 ml/min/mmHg/L
DLCO cor % pred: 75 %
DLCO cor: 16.88 ml/min/mmHg
DLCO unc % pred: 72 %
DLCO unc: 16.23 ml/min/mmHg
FEF 25-75 Post: 1.04 L/sec
FEF 25-75 Pre: 0.76 L/sec
FEF2575-%Change-Post: 37 %
FEF2575-%Pred-Post: 46 %
FEF2575-%Pred-Pre: 33 %
FEV1-%Change-Post: 8 %
FEV1-%Pred-Post: 42 %
FEV1-%Pred-Pre: 39 %
FEV1-Post: 1.17 L
FEV1-Pre: 1.08 L
FEV1FVC-%Change-Post: 1 %
FEV1FVC-%Pred-Pre: 96 %
FEV6-%Change-Post: 6 %
FEV6-%Pred-Post: 45 %
FEV6-%Pred-Pre: 42 %
FEV6-Post: 1.55 L
FEV6-Pre: 1.45 L
FEV6FVC-%Change-Post: 0 %
FEV6FVC-%Pred-Post: 103 %
FEV6FVC-%Pred-Pre: 103 %
FVC-%Change-Post: 7 %
FVC-%Pred-Post: 43 %
FVC-%Pred-Pre: 40 %
FVC-Post: 1.56 L
FVC-Pre: 1.46 L
Post FEV1/FVC ratio: 75 %
Post FEV6/FVC ratio: 99 %
Pre FEV1/FVC ratio: 74 %
Pre FEV6/FVC Ratio: 100 %
RV % pred: 107 %
RV: 2.53 L
TLC % pred: 71 %
TLC: 4.03 L

## 2019-04-11 LAB — URINALYSIS, COMPLETE (UACMP) WITH MICROSCOPIC
Bacteria, UA: NONE SEEN
Bilirubin Urine: NEGATIVE
Glucose, UA: NEGATIVE mg/dL
Hgb urine dipstick: NEGATIVE
Ketones, ur: NEGATIVE mg/dL
Leukocytes,Ua: NEGATIVE
Nitrite: NEGATIVE
Protein, ur: NEGATIVE mg/dL
Specific Gravity, Urine: 1.026 (ref 1.005–1.030)
pH: 5 (ref 5.0–8.0)

## 2019-04-11 LAB — BASIC METABOLIC PANEL
Anion gap: 8 (ref 5–15)
BUN: 24 mg/dL — ABNORMAL HIGH (ref 8–23)
CO2: 34 mmol/L — ABNORMAL HIGH (ref 22–32)
Calcium: 9 mg/dL (ref 8.9–10.3)
Chloride: 97 mmol/L — ABNORMAL LOW (ref 98–111)
Creatinine, Ser: 1.22 mg/dL — ABNORMAL HIGH (ref 0.44–1.00)
GFR calc Af Amer: 53 mL/min — ABNORMAL LOW (ref >60)
GFR calc non Af Amer: 45 mL/min — ABNORMAL LOW (ref >60)
Glucose, Bld: 120 mg/dL — ABNORMAL HIGH (ref 70–99)
Potassium: 4.2 mmol/L (ref 3.5–5.1)
Sodium: 139 mmol/L (ref 135–145)

## 2019-04-11 MED ORDER — MAGNESIUM HYDROXIDE 400 MG/5ML PO SUSP
30.0000 mL | Freq: Once | ORAL | Status: AC
  Administered 2019-04-11: 30 mL via ORAL
  Filled 2019-04-11: qty 30

## 2019-04-11 MED ORDER — METOLAZONE 5 MG PO TABS
5.0000 mg | ORAL_TABLET | Freq: Once | ORAL | Status: AC
  Administered 2019-04-11: 5 mg via ORAL
  Filled 2019-04-11: qty 1

## 2019-04-11 MED ORDER — CHLORHEXIDINE GLUCONATE 0.12 % MT SOLN
15.0000 mL | Freq: Two times a day (BID) | OROMUCOSAL | Status: DC
  Administered 2019-04-11 – 2019-04-12 (×4): 15 mL via OROMUCOSAL
  Filled 2019-04-11 (×4): qty 15

## 2019-04-11 MED ORDER — ALBUTEROL SULFATE (2.5 MG/3ML) 0.083% IN NEBU
2.5000 mg | INHALATION_SOLUTION | Freq: Once | RESPIRATORY_TRACT | Status: AC
  Administered 2019-04-11: 2.5 mg via RESPIRATORY_TRACT

## 2019-04-11 MED ORDER — TRAZODONE HCL 50 MG PO TABS
50.0000 mg | ORAL_TABLET | Freq: Every evening | ORAL | Status: DC | PRN
  Administered 2019-04-11: 50 mg via ORAL
  Filled 2019-04-11: qty 1

## 2019-04-11 MED ORDER — POTASSIUM CHLORIDE CRYS ER 20 MEQ PO TBCR
40.0000 meq | EXTENDED_RELEASE_TABLET | Freq: Once | ORAL | Status: AC
  Administered 2019-04-11: 40 meq via ORAL
  Filled 2019-04-11: qty 2

## 2019-04-11 NOTE — Plan of Care (Signed)
Plan of care 

## 2019-04-11 NOTE — Progress Notes (Addendum)
Advanced Heart Failure Rounding Note  PCP-Cardiologist: No primary care provider on file.   Subjective:    Yesterday diuresed with IV lasix. Negative 1.1 liters.  Denies SOB. Frustrated about Copy.   Complaining about pain under left jaw.     Objective:   Weight Range: 102.3 kg Body mass index is 34.29 kg/m.   Vital Signs:   Temp:  [97.7 F (36.5 C)-98.6 F (37 C)] 97.7 F (36.5 C) (03/03 0500) Pulse Rate:  [0-77] 77 (03/03 0735) Resp:  [11-32] 23 (03/03 0735) BP: (83-155)/(50-75) 105/75 (03/03 0735) SpO2:  [88 %-100 %] 92 % (03/03 0735) Weight:  [102.3 kg] 102.3 kg (03/02 1434) Last BM Date: 04/09/19  Weight change: Filed Weights   04/10/19 0739 04/10/19 1434  Weight: 108.9 kg 102.3 kg    Intake/Output:   Intake/Output Summary (Last 24 hours) at 04/11/2019 1047 Last data filed at 04/11/2019 0502 Gross per 24 hour  Intake 256 ml  Output 1800 ml  Net -1544 ml      Physical Exam    General:  In bed.  No resp difficulty HEENT: Normal anicteric Neck: Supple. JVP 11-12  . Carotids 2+ bilat; no bruits. No lymphadenopathy or thyromegaly appreciated. Cor: PMI nondisplaced. Regular rate & rhythm. No rubs, gallops. 2/6 MR Lungs: Clear decreased at right base Abdomen: Obese Soft, nontender, nondistended. No hepatosplenomegaly. No bruits or masses. Good bowel sounds. Extremities: No cyanosis, clubbing, rash, R and LLE 2+ edema Neuro: alert & oriented x 3, cranial nerves grossly intact. moves all 4 extremities w/o difficulty. Affect pleasant   Telemetry  NSR 70s personally reviewed.    EKG    N/A  Labs    CBC Recent Labs    04/10/19 1128 04/11/19 0515  WBC  --  14.7*  NEUTROABS  --  11.7*  HGB 12.9 12.2  HCT 38.0 40.3  MCV  --  95.7  PLT  --  123456   Basic Metabolic Panel Recent Labs    04/10/19 1128 04/11/19 0515  NA 141 139  K 4.3 4.2  CL  --  97*  CO2  --  34*  GLUCOSE  --  120*  BUN  --  24*  CREATININE  --  1.22*    CALCIUM  --  9.0   Liver Function Tests No results for input(s): AST, ALT, ALKPHOS, BILITOT, PROT, ALBUMIN in the last 72 hours. No results for input(s): LIPASE, AMYLASE in the last 72 hours. Cardiac Enzymes No results for input(s): CKTOTAL, CKMB, CKMBINDEX, TROPONINI in the last 72 hours.  BNP: BNP (last 3 results) No results for input(s): BNP in the last 8760 hours.  ProBNP (last 3 results) No results for input(s): PROBNP in the last 8760 hours.   D-Dimer No results for input(s): DDIMER in the last 72 hours. Hemoglobin A1C No results for input(s): HGBA1C in the last 72 hours. Fasting Lipid Panel No results for input(s): CHOL, HDL, LDLCALC, TRIG, CHOLHDL, LDLDIRECT in the last 72 hours. Thyroid Function Tests No results for input(s): TSH, T4TOTAL, T3FREE, THYROIDAB in the last 72 hours.  Invalid input(s): FREET3  Other results:   Imaging    CARDIAC CATHETERIZATION  Result Date: 04/10/2019  Hemodynamic findings consistent with MODERATE-SEVERE PULMONARY HYPERTENSION and SEVERE MITRAL VALVE REGURGITATION.  There is no aortic valve stenosis.  Angiographically normal coronary arteries  SUMMARY  Angiographically Normal Coronary Arteries  Severe mitral regurgitation by TEE and with PCWP V wave of 62  Severe secondary pulmonary hypertension secondary  to mitral valve regurgitation: PA pressure 67/27 mmHg with mean 43 mmHg.  PCWP mean 33 mmHg.  RA mean 17 mmHg  Concern for worsening ACUTE ON CHRONIC DIASTOLIC HEART FAILURE RECOMMENDATION  Given the severity of the pulmonary hypertension and mitral valve regurgitation, the best course of action is to meet the patient for IV diuresis with plan for inpatient mitral valve repair.  I have discussed the patient with Dr. Aundra Dubin from advanced heart failure service was agreed to accept the patient on their service for IV diuresis.  We will start IV Lasix 80 mg twice daily  Continue current home dose beta-blocker and other home  medications.  Blood pressure may be limited.  CVTS consultation has been placed. Glenetta Hew, MD     Medications:     Scheduled Medications: . aspirin EC  81 mg Oral QHS  . atorvastatin  40 mg Oral QHS  . azelastine  2 spray Each Nare QHS  . enoxaparin (LOVENOX) injection  40 mg Subcutaneous Q24H  . FLUoxetine  20 mg Oral q1800  . fluticasone  2 spray Each Nare QHS  . furosemide  80 mg Intravenous BID  . hydroxychloroquine  200 mg Oral BID  . levothyroxine  125 mcg Oral QAC breakfast  . liothyronine  5 mcg Oral Daily  . metoprolol tartrate  12.5 mg Oral q AM   And  . metoprolol tartrate  50 mg Oral QHS  . pantoprazole  40 mg Oral Daily  . sodium chloride flush  10-40 mL Intracatheter Q12H  . sodium chloride flush  3 mL Intravenous Q12H  . spironolactone  12.5 mg Oral Daily     Infusions: . sodium chloride       PRN Medications:  sodium chloride, acetaminophen, albuterol, nicotine polacrilex, ondansetron (ZOFRAN) IV, sodium chloride flush, sodium chloride flush   Assessment/Plan   1. A/C Diastolic Heart Failure ECHO 03/2019 EF 60-65% grade II DD  -Elevated filling pressures on cath. - Remains volume overloaded.  -Continue to diuresed with IV lasix to tune up for possible valve surgery.  - Follow daily BMET.   2. Mitral Regurgitation  -ECHO/TEE with severe MR P2 prolapse - Preop testing underway.   3.OSA Followed by Dr Annamaria Boots. Hasnt used CPAP in 1 year.   4. h/o lupus and Sjogren's disease - followed at [facility]  Added trazodone for sleep. Move to 3e or 4e due to noise from construction.   Length of Stay: 1  Amy Clegg, NP  04/11/2019, 10:47 AM  Advanced Heart Failure Team Pager [phone] (M-F; 7a - 4p)  Please contact [facility] for night-coverage after hours (4p -7a ) and weekends on [email or web address]  Patient seen and examined with the above-signed Advanced Practice Provider and/or Housestaff. I personally reviewed laboratory data, imaging  studies and relevant notes. I independently examined the patient and formulated the important aspects of the plan. I have edited the note to reflect any of my changes or salient points. I have personally discussed the plan with the patient and/or family.  She continues with persistent volume overload in the setting of severe MR. I have d/w with Dr. Roxy Manns and plan for possible MVR on Friday if we can optimize medically first.   Will continue IV lasix. Add metolazone. Watch K. If not responding well will need to add milrinone.   Dental w/u ok. Does have some tenderness in left submandibular region. Will follow.   Glori Bickers, MD  5:11 PM

## 2019-04-11 NOTE — Consult Note (Signed)
DENTAL CONSULTATION  Date of Consultation:  04/11/2019 Patient Name:   Rebecca Carr Date of Birth:   1950-05-08 Medical Record Number: [id number]  COVID 19 SCREENING: The patient does not symptoms concerning for COVID-19 infection (Including fever, chills, cough, or new SHORTNESS OF BREATH).    VITALS: BP 105/75 (BP Location: Left Wrist)   Pulse 87   Temp 98.6 F (37 C) (Oral)   Resp (!) 23   Ht _0  (1.727 m)   Wt 102.3 kg   SpO2 98%   BMI 34.29 kg/m   CHIEF COMPLAINT: Patient referred by Dr. Roxy Manns for a dental consultation.  HPI: Rebecca Carr is a 69 year old female recently diagnosed with severe mitral regurgitation.  Patient with anticipated heart valve surgery.  Patient is now seen as part of medically necessary preheart valve surgery dental protocol examination to rule out dental infection that may affect the patient's systemic health and anticipated heart valve surgery.  The patient currently denies having acute toothaches, swellings, or abscesses.  Patient was last seen by the dentist approximately 2 years ago for a dental cleaning.  This was by a Pharmacist, community in [place], [place].  Patient cannot remember the name of the dentist at this time.  Patient is usually seen on an annual basis but has not been able to do so secondary to COVID-19 restrictions.  Patient denies having any partial dentures.  Patient denies having dental phobia.  The patient indicates that her uncle was a Pharmacist, community and provided a lot of her dental care.    PROBLEM LIST: Patient Active Problem List   Diagnosis Date Noted  . Pre-op evaluation   . Obstructive sleep apnea 04/03/2019  . Mitral valve prolapse 03/28/2019  . Chest pain of uncertain etiology 28/78/6767  . Coronary artery calcification of native artery 02/27/2019  . Acute on chronic diastolic heart failure ([place]) 01/03/2017  . NSVT (nonsustained ventricular tachycardia) ([place]) 10/01/2016  . Exertional dyspnea 10/01/2016  .  Bilateral leg cramps 07/30/2016  . Severe mitral regurgitation 07/30/2016  . Rapid heart rate 07/30/2016  . Palpitations 07/30/2016  . PVC's (premature ventricular contractions) 07/30/2016  . Abnormal EKG 07/30/2016  . 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 Hx of colonic polyps 08/28/2013  . GERD 03/02/2010  . DERMATITIS, CHRONIC 03/02/2010  . FLATULENCE ERUCTATION AND GAS PAIN 03/02/2010  . DIARRHEA 03/02/2010    PMH: Past Medical History:  Diagnosis Date  . Antiphospholipid syndrome ([place])   . Anxiety   . Arthritis   . Clotting disorder ([place])   . DES exposure in utero   . Diverticulosis of colon   . Dizziness   . Environmental and seasonal allergies   . GERD (gastroesophageal reflux disease)   . Hashimoto's thyroiditis   . Heart palpitations   . Hiatal hernia   . History of adenomatous polyp of colon    hyperplastic 2015  and tubular adenoma 2005  . History of esophageal dilatation    of stricture  . History of esophagitis    LA class , grade B and Gastritis  . History of pituitary tumor    dx 1990's - asymptomatic /  per pt last scan 2005 approx.  resolved   . Hyperlipidemia   . Hypothyroidism   . Leg pain, bilateral   . Mild intermittent asthma   . Moderate mitral regurgitation by prior echocardiography 2012   1-2 MR on Echo  . MTHFR mutation ([place])   .  MVP (mitral valve prolapse)    Mild calcified posterior leaflet prolapse on echo July 2018.  Mild MR.  . OSA (obstructive sleep apnea)    non-compliant cpap  . Prediabetes   . SUI (stress urinary incontinence, female)   . VIN III (vulvar intraepithelial neoplasia III)   . Wears glasses     PSH: Past Surgical History:  Procedure Laterality Date  . CARDIAC EVENT MONITOR  July-August 2018   Normal sinus rhythm with sinus tachycardia.  Average heart rate 91 bpm.  Occasional PVCs.  One 12 and another 6 beat run of wide-complex tachycardia.  2 morphology  PVCs.PVCs noted, but not during longer episode of WCT.  Marland Kitchen CESAREAN SECTION  1994  . CO2 LASER APPLICATION N/A 06/18/2583   Procedure: CO2 LASER APPLICATION ;  Surgeon: Everitt Amber, MD;  Location: [facility];  Service: Gynecology;  Laterality: N/A;  . COLONOSCOPY, ESOPHAGOGASTRODUODENOSCOPY (EGD) AND ESOPHAGEAL DILATION  10-04-2013  . LAPAROSCOPIC CHOLECYSTECTOMY  1995  . LASER ABLATION CONDOLAMATA N/A 07/03/2015   Procedure: CO2 LASER OF VULVA ;  Surgeon: Everitt Amber, MD;  Location: [facility];  Service: Gynecology;  Laterality: N/A;  . NM MYOVIEW LTD  10/2016   LOW RISK.  No ischemia or infarction.  EF 54%.  Marland Kitchen RIGHT/LEFT HEART CATH AND CORONARY ANGIOGRAPHY N/A 04/10/2019   Procedure: RIGHT/LEFT HEART CATH AND CORONARY ANGIOGRAPHY;  Surgeon: Leonie Man, MD;  Location: [facility];  Service: Cardiovascular;  Laterality: N/A;  . TRANSTHORACIC ECHOCARDIOGRAM  11/2010   Normal LV Size & Fxn - EF 55-60%. Mild-Mod MR. Normal DF.  Marland Kitchen TRANSTHORACIC ECHOCARDIOGRAM  08/2016   EF 60 of 65%.  Severe LVH.  Pseudo-normal filling (grade 2 diastolic dysfunction).  MAC with calcified mobile attachments.  Posterior leaflet prolapse.  Mild regurgitation.  Moderate LA dilation.    ALLERGIES: Allergies  Allergen Reactions  . Iodine Anaphylaxis  . Iohexol Anaphylaxis     Desc: Patient states she is allergic to iodine and had a "code blue" incident after an injection for some type of imaging study.   . Penicillins Rash    PCN IN LARGE DOSES. Did it involve swelling of the face/tongue/throat, SOB, or low BP? No Did it involve sudden or severe rash/hives, skin peeling, or any reaction on the inside of your mouth or nose? No Did you need to seek medical attention at a hospital or doctor's office? No When did it last happen? More than 10 years ago If all above answers are "NO", may proceed with cephalosporin use.     MEDICATIONS: Current Facility-Administered  Medications  Medication Dose Route Frequency Provider Last Rate Last Admin  . 0.9 %  sodium chloride infusion  250 mL Intravenous PRN Leonie Man, MD      . acetaminophen (TYLENOL) tablet 650 mg  650 mg Oral Q4H PRN Leonie Man, MD      . albuterol (PROVENTIL) (2.5 MG/3ML) 0.083% nebulizer solution 2.5 mg  2.5 mg Nebulization Q6H PRN Leonie Man, MD      . aspirin EC tablet 81 mg  81 mg Oral QHS Leonie Man, MD      . atorvastatin (LIPITOR) tablet 40 mg  40 mg Oral QHS Leonie Man, MD      . azelastine (ASTELIN) 0.1 % nasal spray 2 spray  2 spray Each Nare QHS Leonie Man, MD   2 spray at 04/10/19 2116  . enoxaparin (LOVENOX) injection 40 mg  40 mg Subcutaneous Q24H Leonie Man, MD   40 mg at 04/11/19 8937  . FLUoxetine (PROZAC) capsule 20 mg  20 mg Oral q1800 Leonie Man, MD   20 mg at 04/10/19 1733  . fluticasone (FLONASE) 50 MCG/ACT nasal spray 2 spray  2 spray Each Nare QHS Leonie Man, MD   2 spray at 04/10/19 2117  . furosemide (LASIX) injection 80 mg  80 mg Intravenous BID Clegg, Amy D, NP   80 mg at 04/11/19 1111  . hydroxychloroquine (PLAQUENIL) tablet 200 mg  200 mg Oral BID Leonie Man, MD   200 mg at 04/11/19 0935  . levothyroxine (SYNTHROID) tablet 125 mcg  125 mcg Oral QAC breakfast Leonie Man, MD   125 mcg at 04/11/19 0522  . liothyronine (CYTOMEL) tablet 5 mcg  5 mcg Oral Daily Leonie Man, MD   5 mcg at 04/11/19 0935  . metoprolol tartrate (LOPRESSOR) tablet 12.5 mg  12.5 mg Oral q AM Bensimhon, Shaune Pascal, MD   12.5 mg at 04/11/19 3428   And  . metoprolol tartrate (LOPRESSOR) tablet 50 mg  50 mg Oral QHS Bensimhon, Shaune Pascal, MD   50 mg at 04/10/19 2114  . nicotine polacrilex (NICORETTE) gum 2 mg  2 mg Oral PRN Leonie Man, MD   2 mg at 04/10/19 2116  . ondansetron (ZOFRAN) injection 4 mg  4 mg Intravenous Q6H PRN Leonie Man, MD      . pantoprazole (PROTONIX) EC tablet 40 mg  40 mg Oral Daily Leonie Man,  MD   40 mg at 04/11/19 0935  . sodium chloride flush (NS) 0.9 % injection 10-40 mL  10-40 mL Intracatheter Q12H Rexene Alberts, MD   10 mL at 04/11/19 0936  . sodium chloride flush (NS) 0.9 % injection 10-40 mL  10-40 mL Intracatheter PRN Rexene Alberts, MD      . sodium chloride flush (NS) 0.9 % injection 3 mL  3 mL Intravenous Q12H Leonie Man, MD   3 mL at 04/11/19 0936  . sodium chloride flush (NS) 0.9 % injection 3 mL  3 mL Intravenous PRN Leonie Man, MD      . spironolactone (ALDACTONE) tablet 12.5 mg  12.5 mg Oral Daily Clegg, Amy D, NP   12.5 mg at 04/11/19 0935    LABS: Lab Results  Component Value Date   WBC 14.7 (H) 04/11/2019   HGB 12.2 04/11/2019   HCT 40.3 04/11/2019   MCV 95.7 04/11/2019   PLT 201 04/11/2019      Component Value Date/Time   NA 139 04/11/2019 0515   NA 141 04/06/2019 1146   K 4.2 04/11/2019 0515   CL 97 (L) 04/11/2019 0515   CO2 34 (H) 04/11/2019 0515   GLUCOSE 120 (H) 04/11/2019 0515   BUN 24 (H) 04/11/2019 0515   BUN 19 04/06/2019 1146   CREATININE 1.22 (H) 04/11/2019 0515   CALCIUM 9.0 04/11/2019 0515   GFRNONAA 45 (L) 04/11/2019 0515   GFRAA 53 (L) 04/11/2019 0515   No results found for: INR, PROTIME No results found for: PTT  SOCIAL HISTORY: Social History   Socioeconomic History  . Marital status: Married    Spouse name: Jenny Reichmann  . Number of children: 1  . Years of education: Not on file  . Highest education level: Not on file  Occupational History  . Occupation: CUSTOMER SERVICE    Employer: [employer]  Comment: Is about to retire  Tobacco Use  . Smoking status: Former Smoker    Packs/day: 1.00    Years: 48.00    Pack years: 48.00    Types: Cigarettes    Quit date: 05/10/2014    Years since quitting: 4.9  . Smokeless tobacco: Never Used  . Tobacco comment: using nicorrette  Substance and Sexual Activity  . Alcohol use: Yes    Alcohol/week: 2.0 standard drinks    Types: 2 Standard drinks or equivalent per  week    Comment: Social - > is now essentially quit  . Drug use: No  . Sexual activity: Not on file  Other Topics Concern  . Not on file  Social History Narrative   No routine exercise. Describes herself as "lazy "   Social Determinants of Health   Financial Resource Strain:   . Difficulty of Paying Living Expenses: Not on file  Food Insecurity:   . Worried About Charity fundraiser in the Last Year: Not on file  . Ran Out of Food in the Last Year: Not on file  Transportation Needs:   . Lack of Transportation (Medical): Not on file  . Lack of Transportation (Non-Medical): Not on file  Physical Activity:   . Days of Exercise per Week: Not on file  . Minutes of Exercise per Session: Not on file  Stress:   . Feeling of Stress : Not on file  Social Connections:   . Frequency of Communication with Friends and Family: Not on file  . Frequency of Social Gatherings with Friends and Family: Not on file  . Attends Religious Services: Not on file  . Active Member of Clubs or Organizations: Not on file  . Attends Archivist Meetings: Not on file  . Marital Status: Not on file  Intimate Partner Violence:   . Fear of Current or Ex-Partner: Not on file  . Emotionally Abused: Not on file  . Physically Abused: Not on file  . Sexually Abused: Not on file    FAMILY HISTORY: Family History  Problem Relation Age of Onset  . Pancreatic cancer Father   . Colon cancer Father   . Heart attack Mother   . Heart disease Mother   . Heart attack Brother   . COPD Brother   . Dementia Brother   . Colon cancer Maternal Grandfather   . Rectal cancer Neg Hx   . Stomach cancer Neg Hx   . Esophageal cancer Neg Hx     REVIEW OF SYSTEMS: Reviewed with the patient as per History of present illness. Psych: Patient denies having dental phobia.  DENTAL HISTORY: CHIEF COMPLAINT: Patient referred by Dr. Roxy Manns for a dental consultation.  HPI: Rebecca Carr is a 69 year old female  recently diagnosed with severe mitral regurgitation.  Patient with anticipated heart valve surgery.  Patient is now seen as part of medically necessary preheart valve surgery dental protocol examination to rule out dental infection that may affect the patient's systemic health and anticipated heart valve surgery.  The patient currently denies having acute toothaches, swellings, or abscesses.  Patient was last seen by the dentist approximately 2 years ago for a dental cleaning.  This was by a Pharmacist, community in [place], [place].  Patient cannot remember the name of the dentist at this time.  Patient is usually seen on an annual basis but has not been able to do so secondary to COVID-19 restrictions.  Patient denies having any partial dentures.  Patient  denies having dental phobia.  DENTAL EXAMINATION: GENERAL: The patient is a well-developed, well-nourished female no acute distress. HEAD AND NECK: There is no palpable submandibular lymphadenopathy.  Patient has some tenderness on the left cheek area.  Patient is unsure why this is present and indicates she may have slept wrong on this area by report. INTRAORAL EXAM: Patient has incipient xerostomia.  There is no evidence of oral abscess formation. DENTITION: Patient is missing tooth numbers 10, 16, 17.  Tooth #32 is present as an impacted tooth. PERIODONTAL: Patient has chronic periodontitis with minimal plaque and calculus accumulations, selective areas of gingival recession and no significant tooth mobility.  There is incipient bone loss noted. DENTAL CARIES/SUBOPTIMAL RESTORATIONS: No obvious dental caries or suboptimal dental restorations are noted. ENDODONTIC: Patient denies acute pulpitis symptoms.  I do not see any evidence of periapical pathology or radiolucency.  Tooth #13 he has had a previous root canal therapy. CROWN AND BRIDGE: Multiple crown restorations are noted.  There is a cantilever bridge in the area of tooth numbers 10 and  11. PROSTHODONTIC: There are no partial dentures. OCCLUSION: The occlusion is stable.  RADIOGRAPHIC INTERPRETATION: [facility] was taken on 04/11/2019. Patient is missing tooth numbers 10 and 16 and 17.  Tooth #32 is impacted.  There is incipient bone loss noted.  Multiple dental restorations are noted.  Multiple crown and bridge restorations are noted.  There is no evidence of periapical pathology or radiolucency.   ASSESSMENTS: 1.  Severe mitral regurgitation 2.  Preheart valve surgery dental protocol 3.  Chronic periodontitis with incipient bone loss 4.  Gingival recession 5.  Accretions-minimal 6.  Multiple missing teeth 7.  Impacted tooth #32 8. Multiple dental restorations in good repair 9.  Stable occlusion 10.  Need for evaluation for antibiotic premedication prior to invasive dental dental procedures after the heart valve surgery per American Heart Association guidelines. 11.  Incipient xerostomia-possibly related to Sjogren's syndrome by patient report.  PLAN/RECOMMENDATIONS: 1. I discussed the risks, benefits, and complications of various treatment options with the patient in relationship to her medical and dental conditions, anticipated heart valve surgery, and risk for endocarditis. We discussed various treatment options to include no treatment, multiple extractions with alveoloplasty, pre-prosthetic surgery as indicated, periodontal therapy, dental restorations, root canal therapy, crown and bridge therapy, implant therapy, and replacement of missing teeth as indicated.  We also discussed obtaining ear nose and throat consultation this admission if she continues to have some sensitivity involving her cheek in the area of the Stensen's duct possibly related to Sjogren's syndrome and salivary gland involvement.  We will plan on prescribing chlorhexidine rinses to use on a twice a day basis to aid in disinfection of the oral cavity at this time.  The patient currently cleared for heart  valve surgery at this time.  Patient is aware that she will require antibiotic premedication prior to invasive dental procedures after her heart valve surgery.  Patient is aware that she ideally will need to be seen twice a year by her primary dentist to maintain oral hygiene and reduce the risk for endocarditis.  Patient is currently cleared for heart valve surgery at this time.  2. Discussion of findings with medical team and coordination of future medical and dental care as needed.     Lenn Cal, DDS

## 2019-04-11 NOTE — Progress Notes (Signed)
Went down for PFT by wheelchair, stable.

## 2019-04-11 NOTE — Consult Note (Signed)
[address]       [place] [address]             [phone]          CARDIOTHORACIC SURGERY CONSULTATION REPORT  PCP is Aurea Graff, PA-C Referring Provider is Leonie Man, MD Primary Cardiologist is No primary care provider on file.  Reason for consultation:  Mitral valve prolapse with severe mitral regurgitation  HPI:  Patient is a 69 year old female with multiple medical problems including a known history of mitral valve prolapse who was admitted to the hospital with acute exacerbation of chronic diastolic congestive heart failure and has been referred for surgical consultation to discuss treatment options for management of mitral valve prolapse with severe symptomatic primary mitral regurgitation.  Patient states that she had "scarlet fever" during childhood.  During early adulthood she was noted to have a murmur on physical exam and nearly 30 years ago she was evaluated at [facility] where she was told she had mitral valve prolapse.  She was also told that eventually she would likely need to have her valve repaired or replaced when she got older.  Patient states that ever since then she has had history of occasional palpitations with atypical chest pain and mild exertional shortness of breath.    Several years ago she began to experience more episodes of palpitations with brief episodes of the sensation that her heart is pounding and racing at a fast rate.  These episodes always stop within a few seconds but are associated with increased shortness of breath.  She was referred for cardiology consultation and has been evaluated previously by Dr. Ellyn Hack.  Echocardiogram performed in 2018 revealed normal left ventricular systolic function with what was felt to be mild mitral regurgitation.  Over the past 6 months the patient has developed increased frequency of palpitations with worsening exertional shortness of breath.  Follow-up  transthoracic echocardiogram performed March 12, 2019 revealed normal left ventricular systolic function with moderately severe and possibly severe mitral regurgitation.  The patient was seen in follow-up by Dr.Harding which time she was experiencing shortness of breath with very mild exertion and occasionally at rest.  She was brought in for elective transesophageal echocardiogram and diagnostic cardiac catheterization on April 10, 2019.  TEE reveals normal left ventricular systolic function, mitral valve prolapse, and mitral regurgitation.  Official report for the TEE remains pending at this time.  Catheterization was notable for the absence of significant coronary artery disease but revealed moderate to severe pulmonary hypertension and large V waves on wedge tracing consistent with severe mitral regurgitation.  The patient was admitted to the hospital and seen in consultation by Dr. Haroldine Laws from the advanced heart failure team.  Intravenous diuretic therapy has been initiated and the patient has responded remarkably well.  Cardiothoracic surgical consultation was requested.  Patient is married and lives locally in [place].  She has been retired for little over 3 years.  She lives a sedentary lifestyle.  She states that she has "fallen apart" since she retired 3 years ago with a rapidly increasing list of multiple medical problems.  Her primary limitation is that of exertional shortness of breath and fatigue.  She now gets short of breath with low-level activity and occasionally at rest.  She cannot lay flat in bed.  She has had some lower extremity edema.  She reports frequent tachypalpitations although none are sustained.  She has not had exertional chest  pain or chest tightness but she does get atypical sharp pains across the left chest off and on.  She has not had dizzy spells or syncope.   Past Medical History:  Diagnosis Date  . Antiphospholipid syndrome ([place])   . Anxiety   . Arthritis     . Clotting disorder ([place])   . DES exposure in utero   . Diverticulosis of colon   . Dizziness   . Environmental and seasonal allergies   . GERD (gastroesophageal reflux disease)   . Hashimoto's thyroiditis   . Heart palpitations   . Hiatal hernia   . History of adenomatous polyp of colon    hyperplastic 2015  and tubular adenoma 2005  . History of esophageal dilatation    of stricture  . History of esophagitis    LA class , grade B and Gastritis  . History of pituitary tumor    dx 1990's - asymptomatic /  per pt last scan 2005 approx.  resolved   . Hyperlipidemia   . Hypothyroidism   . Leg pain, bilateral   . Mild intermittent asthma   . Moderate mitral regurgitation by prior echocardiography 2012   1-2 MR on Echo  . MTHFR mutation ([place])   . MVP (mitral valve prolapse)    Mild calcified posterior leaflet prolapse on echo July 2018.  Mild MR.  . OSA (obstructive sleep apnea)    non-compliant cpap  . Prediabetes   . SUI (stress urinary incontinence, female)   . VIN III (vulvar intraepithelial neoplasia III)   . Wears glasses     Past Surgical History:  Procedure Laterality Date  . CARDIAC EVENT MONITOR  July-August 2018   Normal sinus rhythm with sinus tachycardia.  Average heart rate 91 bpm.  Occasional PVCs.  One 12 and another 6 beat run of wide-complex tachycardia.  2 morphology PVCs.PVCs noted, but not during longer episode of WCT.  Marland Kitchen CESAREAN SECTION  1994  . CO2 LASER APPLICATION N/A 7/53/0051   Procedure: CO2 LASER APPLICATION ;  Surgeon: Everitt Amber, MD;  Location: [facility];  Service: Gynecology;  Laterality: N/A;  . COLONOSCOPY, ESOPHAGOGASTRODUODENOSCOPY (EGD) AND ESOPHAGEAL DILATION  10-04-2013  . LAPAROSCOPIC CHOLECYSTECTOMY  1995  . LASER ABLATION CONDOLAMATA N/A 07/03/2015   Procedure: CO2 LASER OF VULVA ;  Surgeon: Everitt Amber, MD;  Location: [facility];  Service: Gynecology;  Laterality: N/A;  . NM MYOVIEW LTD  10/2016    LOW RISK.  No ischemia or infarction.  EF 54%.  Marland Kitchen RIGHT/LEFT HEART CATH AND CORONARY ANGIOGRAPHY N/A 04/10/2019   Procedure: RIGHT/LEFT HEART CATH AND CORONARY ANGIOGRAPHY;  Surgeon: Leonie Man, MD;  Location: [facility];  Service: Cardiovascular;  Laterality: N/A;  . TRANSTHORACIC ECHOCARDIOGRAM  11/2010   Normal LV Size & Fxn - EF 55-60%. Mild-Mod MR. Normal DF.  Marland Kitchen TRANSTHORACIC ECHOCARDIOGRAM  08/2016   EF 60 of 65%.  Severe LVH.  Pseudo-normal filling (grade 2 diastolic dysfunction).  MAC with calcified mobile attachments.  Posterior leaflet prolapse.  Mild regurgitation.  Moderate LA dilation.    Family History  Problem Relation Age of Onset  . Pancreatic cancer Father   . Colon cancer Father   . Heart attack Mother   . Heart disease Mother   . Heart attack Brother   . COPD Brother   . Dementia Brother   . Colon cancer Maternal Grandfather   . Rectal cancer Neg Hx   . Stomach cancer Neg Hx   .  Esophageal cancer Neg Hx     Social History   Socioeconomic History  . Marital status: Married    Spouse name: Jenny Reichmann  . Number of children: 1  . Years of education: Not on file  . Highest education level: Not on file  Occupational History  . Occupation: CUSTOMER SERVICE    Employer: [employer]    Comment: Is about to retire  Tobacco Use  . Smoking status: Former Smoker    Packs/day: 1.00    Years: 48.00    Pack years: 48.00    Types: Cigarettes    Quit date: 05/10/2014    Years since quitting: 4.9  . Smokeless tobacco: Never Used  . Tobacco comment: using nicorrette  Substance and Sexual Activity  . Alcohol use: Yes    Alcohol/week: 2.0 standard drinks    Types: 2 Standard drinks or equivalent per week    Comment: Social - > is now essentially quit  . Drug use: No  . Sexual activity: Not on file  Other Topics Concern  . Not on file  Social History Narrative   No routine exercise. Describes herself as "lazy "   Social Determinants of Health   Financial  Resource Strain:   . Difficulty of Paying Living Expenses: Not on file  Food Insecurity:   . Worried About Charity fundraiser in the Last Year: Not on file  . Ran Out of Food in the Last Year: Not on file  Transportation Needs:   . Lack of Transportation (Medical): Not on file  . Lack of Transportation (Non-Medical): Not on file  Physical Activity:   . Days of Exercise per Week: Not on file  . Minutes of Exercise per Session: Not on file  Stress:   . Feeling of Stress : Not on file  Social Connections:   . Frequency of Communication with Friends and Family: Not on file  . Frequency of Social Gatherings with Friends and Family: Not on file  . Attends Religious Services: Not on file  . Active Member of Clubs or Organizations: Not on file  . Attends Archivist Meetings: Not on file  . Marital Status: Not on file  Intimate Partner Violence:   . Fear of Current or Ex-Partner: Not on file  . Emotionally Abused: Not on file  . Physically Abused: Not on file  . Sexually Abused: Not on file    Prior to Admission medications   Medication Sig Start Date End Date Taking? Authorizing Provider  ALBUTEROL IN Inhale 1-2 puffs into the lungs every 6 (six) hours as needed.    Yes [provider]  aspirin EC 81 MG tablet Take 81 mg by mouth at bedtime. Reported on 07/16/2015   Yes [provider]  atorvastatin (LIPITOR) 40 MG tablet Take 40 mg by mouth at bedtime.    Yes [provider]  azelastine (ASTELIN) 0.1 % nasal spray Place 2 sprays into both nostrils at bedtime.  07/14/16  Yes [provider]  Cyanocobalamin (VITAMIN B-12) 5000 MCG LOZG Take 5,000 mcg by mouth daily.   Yes [provider]  FLUoxetine (PROZAC) 20 MG capsule Take 20 mg by mouth daily at 6 PM. (1700)   Yes [provider]  fluticasone (FLONASE) 50 MCG/ACT nasal spray Place 2 sprays into both nostrils at bedtime.    Yes [provider]  furosemide (LASIX) 40  MG tablet TAKE 20 MG TO 40 MG TWICE A DAY AS NEEDED FOR SWELLING Patient  taking differently: Take 20 mg by mouth 2 (two) times daily as needed (swelling.).  03/28/19  Yes Leonie Man, MD  hydroxychloroquine (PLAQUENIL) 200 MG tablet Take 200 mg by mouth 2 (two) times daily.  07/03/18  Yes [provider]  Javier Docker Oil 500 MG CAPS Take 500 mg by mouth daily.   Yes [provider]  levothyroxine (SYNTHROID) 125 MCG tablet Take 125 mcg by mouth daily before breakfast.   Yes [provider]  liothyronine (CYTOMEL) 5 MCG tablet Take 5 mcg by mouth daily. 07/07/17  Yes [provider]  metoprolol tartrate (LOPRESSOR) 25 MG tablet TAKE 1/2 TABLET (12.5 MG) IN THE MORNING AND 2 TABLETS (50 MG) AT NIGHT. Patient taking differently: Take 12.5-50 mg by mouth See admin instructions. TAKE 1/2 TABLET (12.5 MG) IN THE MORNING AND 2 TABLETS (50 MG) AT NIGHT. 03/28/19  Yes Leonie Man, MD  Multiple Vitamin (MULTIVITAMIN WITH MINERALS) TABS tablet Take 1 tablet by mouth daily. Multivitamin Essentials   Yes [provider]  neomycin-bacitracin-polymyxin (NEOSPORIN) ointment Apply 1 application topically as needed for wound care (applied to affected area of foot.).   Yes [provider]  nicotine polacrilex (NICORETTE) 2 MG gum Take 2 mg by mouth as needed for smoking cessation.   Yes [provider]  omeprazole (PRILOSEC) 40 MG capsule TAKE 1 CAPSULE(40 MG) BY MOUTH TWICE DAILY Patient taking differently: Take 40 mg by mouth daily before breakfast.  02/05/19  Yes Ladene Artist, MD  predniSONE (DELTASONE) 50 MG tablet Contrast Allergy: Yes, Please take Prednisone 54m by mouth at: Thirteen hours prior to cath 9:00pm on Monday,Seven hours prior to cath 3:00am on Tuesday And prior to leaving home please take last dose of Prednisone 567mand Benadryl 5054my mouth. 6 AM 03/28/19  Yes HarLeonie ManD    Current Facility-Administered Medications    Medication Dose Route Frequency Provider Last Rate Last Admin  . 0.9 %  sodium chloride infusion  250 mL Intravenous PRN HarLeonie ManD      . acetaminophen (TYLENOL) tablet 650 mg  650 mg Oral Q4H PRN HarLeonie ManD      . albuterol (PROVENTIL) (2.5 MG/3ML) 0.083% nebulizer solution 2.5 mg  2.5 mg Nebulization Q6H PRN HarLeonie ManD      . aspirin EC tablet 81 mg  81 mg Oral QHS HarLeonie ManD      . atorvastatin (LIPITOR) tablet 40 mg  40 mg Oral QHS HarLeonie ManD      . azelastine (ASTELIN) 0.1 % nasal spray 2 spray  2 spray Each Nare QHS HarLeonie ManD   2 spray at 04/10/19 2116  . enoxaparin (LOVENOX) injection 40 mg  40 mg Subcutaneous Q24H HarLeonie ManD   40 mg at 04/11/19 [id number] FLUoxetine (PROZAC) capsule 20 mg  20 mg Oral q1800 HarLeonie ManD   20 mg at 04/10/19 1733  . fluticasone (FLONASE) 50 MCG/ACT nasal spray 2 spray  2 spray Each Nare QHS HarLeonie ManD   2 spray at 04/10/19 2117  . furosemide (LASIX) injection 80 mg  80 mg Intravenous BID Clegg, Amy D, NP   80 mg at 04/10/19 1734  . hydroxychloroquine (PLAQUENIL) tablet 200 mg  200 mg Oral BID HarLeonie ManD   200 mg at 04/11/19 0935  . levothyroxine (SYNTHROID) tablet 125 mcg  125 mcg Oral QAC breakfast HarEllyn Hack  Leonie Green, MD   125 mcg at 04/11/19 0522  . liothyronine (CYTOMEL) tablet 5 mcg  5 mcg Oral Daily Leonie Man, MD   5 mcg at 04/11/19 0935  . metoprolol tartrate (LOPRESSOR) tablet 12.5 mg  12.5 mg Oral q AM Bensimhon, Shaune Pascal, MD   12.5 mg at 04/11/19 8546   And  . metoprolol tartrate (LOPRESSOR) tablet 50 mg  50 mg Oral QHS Bensimhon, Shaune Pascal, MD   50 mg at 04/10/19 2114  . nicotine polacrilex (NICORETTE) gum 2 mg  2 mg Oral PRN Leonie Man, MD   2 mg at 04/10/19 2116  . ondansetron (ZOFRAN) injection 4 mg  4 mg Intravenous Q6H PRN Leonie Man, MD      . pantoprazole (PROTONIX) EC tablet 40 mg  40 mg Oral Daily Leonie Man, MD   40 mg  at 04/11/19 0935  . sodium chloride flush (NS) 0.9 % injection 10-40 mL  10-40 mL Intracatheter Q12H Rexene Alberts, MD   10 mL at 04/11/19 0936  . sodium chloride flush (NS) 0.9 % injection 10-40 mL  10-40 mL Intracatheter PRN Rexene Alberts, MD      . sodium chloride flush (NS) 0.9 % injection 3 mL  3 mL Intravenous Q12H Leonie Man, MD   3 mL at 04/11/19 0936  . sodium chloride flush (NS) 0.9 % injection 3 mL  3 mL Intravenous PRN Leonie Man, MD      . spironolactone (ALDACTONE) tablet 12.5 mg  12.5 mg Oral Daily Clegg, Amy D, NP   12.5 mg at 04/11/19 0935    Allergies  Allergen Reactions  . Iodine Anaphylaxis  . Iohexol Anaphylaxis     Desc: Patient states she is allergic to iodine and had a "code blue" incident after an injection for some type of imaging study.   . Penicillins Rash    PCN IN LARGE DOSES. Did it involve swelling of the face/tongue/throat, SOB, or low BP? No Did it involve sudden or severe rash/hives, skin peeling, or any reaction on the inside of your mouth or nose? No Did you need to seek medical attention at a hospital or doctor's office? No When did it last happen? More than 10 years ago If all above answers are "NO", may proceed with cephalosporin use.       Review of Systems:   General:  normal appetite, decreased energy, no weight gain, no weight loss, no fever  Cardiac:  no chest pain with exertion, occasional brief chest pain at rest, +SOB with exertion, occasional resting SOB, + PND, + orthopnea, + palpitations, no arrhythmia, no atrial fibrillation, + LE edema, no dizzy spells, no syncope  Respiratory:  + shortness of breath, no home oxygen, no productive cough, no dry cough, + bronchitis, + wheezing, no hemoptysis, no asthma, no pain with inspiration or cough, + sleep apnea, + CPAP at night  GI:   occasional difficulty swallowing, no reflux, no frequent heartburn, no hiatal hernia, no abdominal pain, + constipation, no diarrhea, no  hematochezia, no hematemesis, no melena  GU:   no dysuria,  no frequency, no urinary tract infection, no hematuria, no kidney stones, + kidney disease with proteinuria  Vascular:  no pain suggestive of claudication, no pain in feet, no leg cramps, no varicose veins, no DVT, no non-healing foot ulcer  Neuro:   no stroke, no TIA's, no seizures, no headaches, no temporary blindness one eye,  no slurred speech,  no peripheral neuropathy, no chronic pain, no instability of gait, no memory/cognitive dysfunction  Musculoskeletal: no arthritis, no joint swelling, no myalgias, no difficulty walking, normal mobility   Skin:   + rash, no itching, no skin infections, no pressure sores or ulcerations  Psych:   no anxiety, no depression, + nervousness, no unusual recent stress  Eyes:   no blurry vision, no floaters, no recent vision changes, + wears glasses or contacts  ENT:   no hearing loss, no loose or painful teeth, no dentures, last saw dentist > 2 years ago  Hematologic:  no easy bruising, no abnormal bleeding, + clotting disorder, + frequent epistaxis  Endocrine:  no diabetes, does not check CBG's at home     Physical Exam:   BP 105/75 (BP Location: Left Wrist)   Pulse 77   Temp 97.7 F (36.5 C) (Oral)   Resp (!) 23   Ht _0  (1.727 m)   Wt 102.3 kg   SpO2 92%   BMI 34.29 kg/m   General:  Obese female NAD    HEENT:  Unremarkable   Neck:   no JVD, no bruits, no adenopathy   Chest:   Few bibasilar crackles, symmetrical breath sounds, no wheezes, no rhonchi   CV:   RRR, grade III/VI blowing holosystolic murmur   Abdomen:  soft, non-tender, no masses   Extremities:  warm, well-perfused, pulses diminished, mild lower extremity edema  Rectal/GU  Deferred  Neuro:   Grossly non-focal and symmetrical throughout  Skin:   Clean and dry, no rashes, no breakdown  Diagnostic Tests:  Lab Results: Recent Labs    04/10/19 1128 04/11/19 0515  WBC  --  14.7*  HGB 12.9 12.2  HCT 38.0 40.3  PLT  --   201   BMET:  Recent Labs    04/10/19 1128 04/11/19 0515  NA 141 139  K 4.3 4.2  CL  --  97*  CO2  --  34*  GLUCOSE  --  120*  BUN  --  24*  CREATININE  --  1.22*  CALCIUM  --  9.0    CBG (last 3)  No results for input(s): GLUCAP in the last 72 hours. PT/INR:  No results for input(s): LABPROT, INR in the last 72 hours.  CXR:  CHEST - 2 VIEW  COMPARISON:  None.  FINDINGS: Heart size is enlarged. The pulmonary arteries appear to be dilated. There are small bilateral pleural effusions, right greater than left. There is some atelectasis at the lung bases. There are mild degenerative changes throughout the visualized thoracolumbar spine. No acute osseous abnormality.  IMPRESSION: 1. Cardiomegaly with small to moderate bilateral pleural effusions, right greater than left. 2. Dilated pulmonary arteries which can be seen in patients with elevated pulmonary artery pressures. 3. Bibasilar atelectasis.   Electronically Signed   By: Constance Holster M.D.   On: 04/03/2019 22:33    ECHOCARDIOGRAM REPORT       Patient Name:  Rebecca Carr Date of Exam: 03/12/2019  Medical Rec #: [id number]    Height:    68.0 in  Accession #:  [id number]    Weight:    235.0 lb  Date of Birth: Jun 26, 1950    BSA:     2.19 m  Patient Age:  28 years     BP:      115/72 mmHg  Patient Gender: F        HR:      78 bpm.  Exam  Location: [address]   Procedure: 2D Echo, 3D Echo, Cardiac Doppler, Color Doppler and Strain  Analysis   Indications:  R06.00 Dyspnea    History:    Patient has prior history of Echocardiogram examinations,  most         recent 08/25/2016. Risk Factors:Hypertension, Dyslipidemia,  Sleep         Apnea and Pre-Diabtetes. Hypothyroidism. Anxiety. Mitral  Valve         Prolapse.    Sonographer:  Cresenciano Lick RDCS  Referring Phys: [place]    1. The average left ventricular global longitudinal strain is -18.8 %.  2. Mildly dilated left ventricular internal cavity size.  3. Left ventricular ejection fraction, by visual estimation, is 60 to  65%. The left ventricle has normal function. Left ventricular septal wall  thickness was mildly increased.  4. The left ventricle has no regional wall motion abnormalities.  5. Elevated left atrial and left ventricular end-diastolic pressures.  6. Left ventricular diastolic parameters are consistent with Grade II  diastolic dysfunction (pseudonormalization).  7. Global right ventricle has normal systolic function.The right  ventricular size is mildly enlarged. No increase in right ventricular wall  thickness.  8. Left atrial size was severely dilated.  9. Right atrial size was moderately dilated.  10. 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 The anterior MV leaflet is moderately  thickened. There is significant mitral valve prolapse of the posterior  leaflet with at least moderate and  possibly severe mitral regurgistation eccentrically directed towards with  interatrial septum. There is mild mitral stenosis with a mean MVG of 25mhg  and MVG 1.75cm2.  11. The tricuspid valve is normal in structure. Tricuspid valve  regurgitation is mild.  12. The aortic valve is normal in structure. Aortic valve regurgitation is  not visualized. No evidence of aortic valve sclerosis or stenosis.  13. The pulmonic valve was normal in structure. Pulmonic valve  regurgitation is not visualized.  14. Moderately elevated pulmonary artery systolic pressure.  15. The inferior vena cava is dilated in size with <50% respiratory  variability, suggesting right atrial pressure of 15 mmHg.  16. Recommend TEE for better assessment of degree of MR.   FINDINGS  Left Ventricle: Left ventricular ejection fraction, by visual  estimation,  is 65 to 70%. The left ventricle has normal function. The average left  ventricular global longitudinal strain is -18.8 %. The left ventricle has  no regional wall motion  abnormalities. The left ventricular internal cavity size was mildly  dilated left ventricle.  Left ventricular diastolic parameters are consistent with Grade II  diastolic dysfunction (pseudonormalization).  Right Ventricle: The right ventricular size is mildly enlarged. No  increase in right ventricular wall thickness. Global RV systolic function  is has normal systolic function. The tricuspid regurgitant velocity is  3.16 m/s, and with an assumed right atrial  pressure of 15 mmHg, the estimated right ventricular systolic pressure is  moderately elevated at 54.9 mmHg.   Left Atrium: Left atrial size was severely dilated.   Right Atrium: Right atrial size was moderately dilated Prominent Chiari  network.   Pericardium: There is no evidence of pericardial effusion.   Mitral Valve:   - proliferation. No evidence of mitral valve regurgitation. No evidence of  mitral stenosis.Mild mitral valve stenosis by observation. MV peak  gradient, 19.1 mmHg.   Tricuspid Valve: The tricuspid valve is normal in structure. Tricuspid  valve regurgitation is mild.   Aortic Valve: The aortic valve is normal in structure. Aortic valve  regurgitation is not visualized. The aortic valve is structurally normal,  with no evidence of sclerosis or stenosis.   Pulmonic Valve: The pulmonic valve was normal in structure. Pulmonic valve  regurgitation is not visualized. Pulmonic regurgitation is not visualized.   Aorta: The aortic root, ascending aorta and aortic arch are all  structurally normal, with no evidence of dilitation or obstruction.   Venous: The inferior vena cava is dilated in size with less than 50%  respiratory variability, suggesting right atrial pressure of 15 mmHg.   IAS/Shunts: No atrial level shunt  detected by color flow Doppler. There is  no evidence of a patent foramen ovale. No ventricular septal defect is  seen or detected. There is no evidence of an atrial septal defect.     LEFT VENTRICLE  PLAX 2D  LVIDd:     5.50 cm Diastology  LVIDs:     3.10 cm LV e' lateral:  7.94 cm/s  LV PW:     1.20 cm LV E/e' lateral: 19.4  LV IVS:    1.20 cm LV e' medial:  5.77 cm/s  LVOT diam:   1.70 cm LV E/e' medial: 26.7  LV SV:     110 ml  LV SV Index:  47.54  2D Longitudinal Strain  LVOT Area:   2.27 cm 2D Strain GLS (A2C):  -18.5 %             2D Strain GLS (A3C):  -19.7 %             2D Strain GLS (A4C):  -18.4 %             2D Strain GLS Avg:   -18.8 %               3D Volume EF:             3D EF:    69 %             LV EDV:    175 ml             LV ESV:    55 ml             LV SV:    120 ml   RIGHT VENTRICLE  RV Basal diam: 4.60 cm  RV S prime:   11.60 cm/s  TAPSE (M-mode): 2.1 cm   LEFT ATRIUM       Index    RIGHT ATRIUM      Index  LA diam:    5.70 cm 2.60 cm/m RA Area:   22.30 cm  LA Vol (A2C):  135.0 ml 61.68 ml/m RA Volume:  73.30 ml 33.49 ml/m  LA Vol (A4C):  128.0 ml 58.48 ml/m  LA Biplane Vol: 132.0 ml 60.31 ml/m  AORTIC VALVE  LVOT Vmax:  93.95 cm/s  LVOT Vmean: 64.700 cm/s  LVOT VTI:  0.162 m    AORTA  Ao Root diam: 3.10 cm   MITRAL VALVE             TRICUSPID VALVE  MV Area (PHT): 3.72 cm       TR Peak grad:  39.9 mmHg  MV Peak grad: 19.1 mmHg       TR Vmax:    316.00 cm/s  MV Mean grad: 5.3 mmHg  MV Vmax:    2.19 m/s  SHUNTS  MV Vmean:   105.3 cm/s      Systemic VTI: 0.16 m  MV VTI:    0.48 m        Systemic Diam: 1.70 cm  MV PHT:    59.16 msec  MV Decel Time: 204 msec  MV E  velocity: 154.00 cm/s 103 cm/s  MV A velocity: 86.20 cm/s 70.3 cm/s  MV E/A ratio: 1.79    1.5     Fransico Him MD  Electronically signed by Fransico Him MD  Signature Date/Time: 03/12/2019/1:20:19 PM       Procedure:  Transesophageal echocardiogram Operator:  Fransico Him, MD Indications:  Severe MR Complications: None  During this procedure the patient is administered a total of Propofol 270 mg to achieve and maintain moderate conscious sedation by anesthesia.  The patient's heart rate, blood pressure, and oxygen saturation are monitored continuously during the procedure.   Results: Normal LV size and function Normal RV size and function Moderately dilated RA Severely dilated  LA Normal TV with mild TR Normal PV with trivial PR Normal MV Normal trileaflet AV with mildly aortic valve sclerosis with no stenosis and trivial AR Hypermobile interatrial septum with PFO by agitated saline contrast injection.  Mild atherosclerosis of the  thoracic and ascending aorta.  The patient tolerated the procedure well and was transferred back to their room in stable condition.  Signed: Fransico Him, MD [facility]      RIGHT/LEFT HEART CATH AND CORONARY ANGIOGRAPHY  Conclusion    Hemodynamic findings consistent with MODERATE-SEVERE PULMONARY HYPERTENSION and SEVERE MITRAL VALVE REGURGITATION.  There is no aortic valve stenosis.  Angiographically normal coronary arteries   SUMMARY  Angiographically Normal Coronary Arteries  Severe mitral regurgitation by TEE and with PCWP V wave of 62  Severe secondary pulmonary hypertension secondary to mitral valve regurgitation: PA pressure 67/27 mmHg with mean 43 mmHg.  PCWP mean 33 mmHg.  RA mean 17 mmHg  Concern for worsening ACUTE ON CHRONIC DIASTOLIC HEART FAILURE  RECOMMENDATION  Given the severity of the pulmonary hypertension and mitral valve regurgitation, the best course of action is to meet the patient for  IV diuresis with plan for inpatient mitral valve repair.  I have discussed the patient with Dr. Aundra Dubin from advanced heart failure service was agreed to accept the patient on their service for IV diuresis.  We will start IV Lasix 80 mg twice daily  Continue current home dose beta-blocker and other home medications.  Blood pressure may be limited.  CVTS consultation has been placed.   Glenetta Hew, MD    Recommendations  Discharge Date Anticipated discharge date to be determined. Patient will be admitted for diuresis followed by mitral valve repair  Procedural Details  Technical Details Primary Care Provider: Sammuel Hines, PA-C Cardiologist: Glenetta Hew, MD  Rebecca Carr is a 69 y.o. female initially seen for palpitations and heart murmur-with initial diagnosis of mild mitral vegetation several years ago presented with progressively worsening exertional dyspnea and chest discomfort.  She was evaluated with an echocardiogram and Myoview revealing nonischemic Myoview, but definite progression of mitral valve prolapse and regurgitation suggestive of moderate to severe regurgitation.  TEE was recommended.  Based on the severity of her symptoms, I decided to proceed with TEE and right and left heart catheterization as part of likely preoperative assessment for mitral valve repair (MVR).  She has now gone through her TEE confirming severe MR with significant mitral prolapse and flail leaflet.  Time Out: Verified  patient identification, verified procedure, site/side was marked, verified correct patient position, special equipment/implants available, medications/allergies/relevent history reviewed, required imaging and test results available. Performed.  Access:  RIGHT Radial Artery: 6 Fr sheath --modified Seldinger technique using Micropuncture Kit  -- Direct ultrasound guidance used.  Permanent image obtained and placed on chart.  -- 10 mL radial cocktail IA; 5000 Units IV  Heparin * Right Brachial/Antecubital Vein: 6 Fr sheath --modified Seldinger technique using Micropuncture Kit  -- Direct ultrasound guidance used.  Permanent image obtained and placed on chart.  Right Heart Catheterization: 5 Fr Gordy Councilman catheter advanced under fluoroscopy with balloon inflated to the RA, RV, then PCWP-PA for hemodynamic measurement.  * Simultaneous FA & PA blood gases checked for SaO2% to calculate FICK CO/CI  * Thermodilution Injections performed to calculate CO/CI  Simultaneous PCWP/LV & RV/LV pressures monitored with Angled Pigtail in LV.  * Catheter removed completely out of the body with balloon deflated.  Left Heart Catheterization: 5Fr Catheters advanced or exchanged over a J-wire under direct fluoroscopic guidance into the ascending aorta; TIG 4.0 catheter advanced first.  * LV Hemodynamics (no LV Gram): Angled Pigtail catheter * Left & Right Coronary Artery Cineangiography: TIG 4.0 Catheter   Upon completion of Angiogaphy, the catheter was removed completely out of the body over a wire, without complication.  Brachial Sheath(s) removed in the PACU holding area with manual pressure for hemostasis.    Radial sheath removed in the Cardiac Catheterization lab with TR Band placed for hemostasis.  TR Band: 1135  Hours; 13 mL air  MEDICATIONS * SQ Lidocaine 32m * Radial Cocktail: 3 mg Verapmil in 10 mL NS * Isovue Contrast: 30 mL * Heparin: 5000 Units  Estimated blood loss <50 mL.   During this procedure medications were administered to achieve and maintain moderate conscious sedation while the patient's heart rate, blood pressure, and oxygen saturation were continuously monitored and I was present face-to-face 100% of this time.    Sedation Time Physician-1: 32 minutes 57 seconds  Contrast  Medication Name Total Dose  iohexol (OMNIPAQUE) 350 MG/ML injection 30 mL    Radiation/Fluoro  Fluoro time: 6.1 (min) DAP: 17497 (mGycm2) Cumulative Air Kerma: 3007 (mGy)  Complications  Complications documented before study signed (04/10/2019 162:26PM)   No complications were associated with this study.  Documented by HLeonie Man MD - 04/10/2019 12:07 PM    Coronary Findings  Diagnostic Dominance: Right Left Main  Vessel was injected. Vessel is normal in caliber. Vessel is angiographically normal.  Left Anterior Descending  Vessel was injected. Vessel is normal in caliber. Vessel is angiographically normal.  Left Circumflex  Vessel was injected. Vessel is normal in caliber. Vessel is angiographically normal.  Left Posterior Atrioventricular Artery  Vessel is small in size.  Right Coronary Artery  Vessel was injected. Vessel is normal in caliber and large. Vessel is angiographically normal.  Acute Marginal Branch  Vessel is small in size.  Right Ventricular Branch  Vessel is small in size.  Right Posterior Descending Artery  Vessel is moderate in size.  Right Posterior Atrioventricular Artery  Vessel is large in size.  First Right Posterolateral Branch  Vessel is small in size.  Third Right Posterolateral Branch  Vessel is small in size.  Intervention  No interventions have been documented. Right Heart  Right Heart Pressures Hemodynamic findings consistent with moderate pulmonary hypertension and mitral valve regurgitation. Moderate-severe secondary PULMONARY HYPERTENSION: PAP: 67/27 mmHg, mean 43-45 mmHg; PCWP 26/62 mmHg (very  large mitral V wave) with mean of 33 mmHg Elevated LV EDP consistent with volume overload. LV P-EDP 96/12 mmHg - 27 mmHg AoP-MAP 96/60 mmHg-76 mmHg Ao sat 98%, PA sat 67%. Fick cardiac output: 5.24, cardiac index 2.37. PVR 150 3D/S, 337 D/SI; T PVR 650 6D/S; 1451 D/SI  Right Atrium The right atrium is moderately dilated.  Right atrial pressure is elevated. RA P: 21/19 mmHg-mean 17 mmHg  Right Ventricle RVP-EDP: 66/14 mmHg - 22 mmHg  Wall Motion  Resting    Very sensitive LV. No LV gram performed.         Left Heart  Mitral Valve Severe based on PA waveform with V wave of 62 mmHg  Aortic Valve There is no aortic valve stenosis. There is normal aortic valve motion.  Coronary Diagrams  Diagnostic Dominance: Right  Intervention  Implants   No implant documentation for this case.  Syngo Images  Show images for CARDIAC CATHETERIZATION  Images on Long Term Storage  Show images for Kyannah, Climer to Procedure Log  Procedure Log    Hemo Data   Most Recent Value  Fick Cardiac Output 5.24 L/min  Fick Cardiac Output Index 2.37 (L/min)/BSA  RA A Wave 21 mmHg  RA V Wave 19 mmHg  RA Mean 17 mmHg  RV Systolic Pressure 66 mmHg  RV Diastolic Pressure 14 mmHg  RV EDP 22 mmHg  PA Systolic Pressure 67 mmHg  PA Diastolic Pressure 27 mmHg  PA Mean 43 mmHg  PW A Wave 26 mmHg  PW V Wave 62 mmHg  PW Mean 33 mmHg  LV Systolic Pressure 93 mmHg  LV Diastolic Pressure 14 mmHg  LV EDP 25 mmHg  AOp Systolic Pressure 96 mmHg  AOp Diastolic Pressure 60 mmHg  AOp Mean Pressure 76 mmHg  LVp Systolic Pressure 96 mmHg  LVp Diastolic Pressure 12 mmHg  LVp EDP Pressure 27 mmHg  QP/QS 1  TPVR Index 18.14 HRUI      Impression:  Patient has mitral valve prolapse with stage D severe symptomatic primary mitral regurgitation.  She describes a long history of symptoms of exertional shortness of breath and palpitations which have accelerated dramatically over the last 6 months culminating in her current hospitalization with acute exacerbation of chronic diastolic congestive heart failure, [place] Heart Association functional class IV.  Comorbid medical problems include obesity, obstructive sleep apnea, history of longstanding tobacco abuse with likely COPD, recently diagnosed Hashimoto's thyroiditis, lupus, and possible Sjogren's syndrome.  She lives a sedentary lifestyle and appears somewhat deconditioned.  She has not seen a dentist in more than 2 years.  I have personally reviewed the  patient's recent transthoracic and transesophageal echocardiograms and diagnostic cardiac catheterization.  Echocardiograms demonstrate normal left ventricular size and systolic function.  There is severe left atrial enlargement.  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  Right ventricular size and function appears normal.  There is mild tricuspid regurgitation.  The tricuspid annulus does not appear dilated greater than 4 cm although it does not appear to have been measured directly at the time of TEE.  Catheterization is notable for the absence of significant coronary artery disease and revealed moderate pulmonary hypertension with large V waves on wedge tracing consistent with severe mitral regurgitation.  I agree the patient needs mitral valve repair.  Risks  associated with surgery will be slightly elevated because of the patient's numerous comorbid medical problems and relative physical deconditioning.  However, the patient may still be a reasonable candidate for minimally invasive approach for surgery.    Plan:  The patient was counseled at length regarding the indications, risks and potential benefits of mitral valve repair.  The rationale for elective surgery has been explained, including a comparison between surgery and continued medical therapy with close follow-up.  The likelihood of successful and durable mitral valve repair has been discussed with particular reference to the findings of their recent echocardiogram.   Alternative surgical approaches have been discussed including a comparison between conventional sternotomy and minimally-invasive techniques.  The relative risks and benefits of each have been reviewed as they pertain to the patient's specific circumstances, and expectations for the patient's  postoperative convalescence has been discussed.  The patient desires to proceed with surgery in the near future.   As the next step the patient will complete further diagnostic work-up including pulmonary function testing and CT angiography to evaluate the feasibility of peripheral cannulation for surgery.  The patient will need dental service consultation.  We will continue to follow along while the patient remains in the hospital and discuss timing of surgery with Dr. Haroldine Laws and Dr. Ellyn Hack depending on her clinical progress.    I spent in excess of 120 minutes during the conduct of this hospital consultation and >50% of this time involved direct face-to-face encounter for counseling and/or coordination of the patient's care.    Valentina Gu. Roxy Manns, MD 04/11/2019 10:05 AM

## 2019-04-11 NOTE — Progress Notes (Signed)
Transferred to [place] by wheelchair, stable. Report given to RN. Belongings with the pt.

## 2019-04-11 NOTE — Progress Notes (Signed)
CARDIAC REHAB PHASE I   PRE:  Rate/Rhythm: 81 SR  BP:  Supine: 85/74  Sitting:   Standing: 102/72   SaO2: 91%RA  MODE:  Ambulation: 210 ft   POST:  Rate/Rhythm: 111   1115-1152 Pt excited to get OOB. Set up recliner and was going to have pt sit up after walk. Pt walked 210 ft on RA and had to go back to room to void.  Transporter here for XR and CT.  Put pt back to bed after bathroom trip. She denied SOB and was glad to walk. Encouraged her to get up later with staff. To bed and pt taken for tests.   Graylon Good, RN BSN  04/11/2019 11:50 AM

## 2019-04-12 ENCOUNTER — Inpatient Hospital Stay (HOSPITAL_COMMUNITY): Payer: Medicare Other

## 2019-04-12 DIAGNOSIS — Z01810 Encounter for preprocedural cardiovascular examination: Secondary | ICD-10-CM

## 2019-04-12 LAB — BLOOD GAS, ARTERIAL
Acid-Base Excess: 15.7 mmol/L — ABNORMAL HIGH (ref 0.0–2.0)
Bicarbonate: 40.5 mmol/L — ABNORMAL HIGH (ref 20.0–28.0)
Drawn by: 35043
FIO2: 21
O2 Saturation: 86.1 %
Patient temperature: 36.8
pCO2 arterial: 54.9 mmHg — ABNORMAL HIGH (ref 32.0–48.0)
pH, Arterial: 7.48 — ABNORMAL HIGH (ref 7.350–7.450)
pO2, Arterial: 51.3 mmHg — ABNORMAL LOW (ref 83.0–108.0)

## 2019-04-12 LAB — COMPREHENSIVE METABOLIC PANEL
ALT: 38 U/L (ref 0–44)
AST: 23 U/L (ref 15–41)
Albumin: 3.5 g/dL (ref 3.5–5.0)
Alkaline Phosphatase: 72 U/L (ref 38–126)
Anion gap: 9 (ref 5–15)
BUN: 23 mg/dL (ref 8–23)
CO2: 37 mmol/L — ABNORMAL HIGH (ref 22–32)
Calcium: 9.1 mg/dL (ref 8.9–10.3)
Chloride: 93 mmol/L — ABNORMAL LOW (ref 98–111)
Creatinine, Ser: 1.28 mg/dL — ABNORMAL HIGH (ref 0.44–1.00)
GFR calc Af Amer: 50 mL/min — ABNORMAL LOW (ref >60)
GFR calc non Af Amer: 43 mL/min — ABNORMAL LOW (ref >60)
Glucose, Bld: 104 mg/dL — ABNORMAL HIGH (ref 70–99)
Potassium: 3.9 mmol/L (ref 3.5–5.1)
Sodium: 139 mmol/L (ref 135–145)
Total Bilirubin: 2.1 mg/dL — ABNORMAL HIGH (ref 0.3–1.2)
Total Protein: 5.9 g/dL — ABNORMAL LOW (ref 6.5–8.1)

## 2019-04-12 LAB — MRSA PCR SCREENING: MRSA by PCR: NEGATIVE

## 2019-04-12 LAB — SURGICAL PCR SCREEN
MRSA, PCR: NEGATIVE
Staphylococcus aureus: NEGATIVE

## 2019-04-12 LAB — HEMOGLOBIN A1C
Hgb A1c MFr Bld: 6.3 % — ABNORMAL HIGH (ref 4.8–5.6)
Mean Plasma Glucose: 134.11 mg/dL

## 2019-04-12 LAB — PREALBUMIN: Prealbumin: 21.4 mg/dL (ref 18–38)

## 2019-04-12 LAB — ABO/RH: ABO/RH(D): O POS

## 2019-04-12 MED ORDER — MILRINONE LACTATE IN DEXTROSE 20-5 MG/100ML-% IV SOLN
0.3000 ug/kg/min | INTRAVENOUS | Status: DC
  Filled 2019-04-12: qty 100

## 2019-04-12 MED ORDER — TRANEXAMIC ACID (OHS) PUMP PRIME SOLUTION
2.0000 mg/kg | INTRAVENOUS | Status: DC
  Filled 2019-04-12: qty 1.99

## 2019-04-12 MED ORDER — TEMAZEPAM 7.5 MG PO CAPS
15.0000 mg | ORAL_CAPSULE | Freq: Once | ORAL | Status: AC | PRN
  Administered 2019-04-12: 15 mg via ORAL
  Filled 2019-04-12: qty 2

## 2019-04-12 MED ORDER — SORBITOL 70 % SOLN
30.0000 mL | Freq: Once | Status: AC
  Administered 2019-04-12: 30 mL via ORAL
  Filled 2019-04-12: qty 30

## 2019-04-12 MED ORDER — LEVOFLOXACIN IN D5W 500 MG/100ML IV SOLN
500.0000 mg | INTRAVENOUS | Status: AC
  Administered 2019-04-13: 08:00:00 500 mg via INTRAVENOUS
  Filled 2019-04-12: qty 100

## 2019-04-12 MED ORDER — CHLORHEXIDINE GLUCONATE 4 % EX LIQD
60.0000 mL | Freq: Once | CUTANEOUS | Status: DC
  Filled 2019-04-12: qty 60

## 2019-04-12 MED ORDER — METOLAZONE 5 MG PO TABS
5.0000 mg | ORAL_TABLET | Freq: Once | ORAL | Status: AC
  Administered 2019-04-12: 5 mg via ORAL
  Filled 2019-04-12: qty 1

## 2019-04-12 MED ORDER — GLUTARALDEHYDE 0.625% SOAKING SOLUTION
TOPICAL | Status: DC
  Filled 2019-04-12: qty 50

## 2019-04-12 MED ORDER — NOREPINEPHRINE 4 MG/250ML-% IV SOLN
0.0000 ug/min | INTRAVENOUS | Status: DC
  Filled 2019-04-12: qty 250

## 2019-04-12 MED ORDER — SODIUM CHLORIDE 0.9 % IV SOLN
INTRAVENOUS | Status: DC
  Filled 2019-04-12: qty 30

## 2019-04-12 MED ORDER — POTASSIUM CHLORIDE 2 MEQ/ML IV SOLN
80.0000 meq | INTRAVENOUS | Status: DC
  Filled 2019-04-12: qty 40

## 2019-04-12 MED ORDER — MANNITOL 20 % IV SOLN
INTRAVENOUS | Status: DC
  Filled 2019-04-12: qty 13

## 2019-04-12 MED ORDER — PHENYLEPHRINE HCL-NACL 20-0.9 MG/250ML-% IV SOLN
30.0000 ug/min | INTRAVENOUS | Status: AC
  Administered 2019-04-13: 45 ug/min via INTRAVENOUS
  Filled 2019-04-12: qty 250

## 2019-04-12 MED ORDER — VANCOMYCIN HCL 1500 MG/300ML IV SOLN
1500.0000 mg | INTRAVENOUS | Status: AC
  Administered 2019-04-13: 1500 mg via INTRAVENOUS
  Filled 2019-04-12: qty 300

## 2019-04-12 MED ORDER — EPINEPHRINE HCL 5 MG/250ML IV SOLN IN NS
0.0000 ug/min | INTRAVENOUS | Status: DC
  Filled 2019-04-12: qty 250

## 2019-04-12 MED ORDER — DEXMEDETOMIDINE HCL IN NACL 400 MCG/100ML IV SOLN
0.1000 ug/kg/h | INTRAVENOUS | Status: DC
  Filled 2019-04-12: qty 100

## 2019-04-12 MED ORDER — SODIUM CHLORIDE 0.9 % IV SOLN
1.0000 g | INTRAVENOUS | Status: DC
  Administered 2019-04-12: 1000 mg via INTRAVENOUS
  Filled 2019-04-12 (×2): qty 1

## 2019-04-12 MED ORDER — INSULIN REGULAR(HUMAN) IN NACL 100-0.9 UT/100ML-% IV SOLN
INTRAVENOUS | Status: DC
  Filled 2019-04-12: qty 100

## 2019-04-12 MED ORDER — PLASMA-LYTE 148 IV SOLN
INTRAVENOUS | Status: DC
  Filled 2019-04-12: qty 2.5

## 2019-04-12 MED ORDER — CHLORHEXIDINE GLUCONATE 0.12 % MT SOLN
15.0000 mL | Freq: Once | OROMUCOSAL | Status: DC
  Filled 2019-04-12: qty 15

## 2019-04-12 MED ORDER — TRANEXAMIC ACID 1000 MG/10ML IV SOLN
1.5000 mg/kg/h | INTRAVENOUS | Status: DC
  Filled 2019-04-12: qty 25

## 2019-04-12 MED ORDER — VANCOMYCIN HCL 1000 MG IV SOLR
INTRAVENOUS | Status: DC
  Filled 2019-04-12: qty 1000

## 2019-04-12 MED ORDER — CHLORHEXIDINE GLUCONATE 4 % EX LIQD
60.0000 mL | Freq: Once | CUTANEOUS | Status: AC
  Administered 2019-04-12: 4 via TOPICAL
  Filled 2019-04-12: qty 60

## 2019-04-12 MED ORDER — BISACODYL 5 MG PO TBEC
5.0000 mg | DELAYED_RELEASE_TABLET | Freq: Once | ORAL | Status: AC
  Administered 2019-04-12: 5 mg via ORAL
  Filled 2019-04-12: qty 1

## 2019-04-12 MED ORDER — NITROGLYCERIN IN D5W 200-5 MCG/ML-% IV SOLN
2.0000 ug/min | INTRAVENOUS | Status: DC
  Filled 2019-04-12: qty 250

## 2019-04-12 MED ORDER — PIPERACILLIN-TAZOBACTAM 3.375 G IVPB
3.3750 g | Freq: Three times a day (TID) | INTRAVENOUS | Status: DC
  Filled 2019-04-12: qty 50

## 2019-04-12 MED ORDER — TRANEXAMIC ACID (OHS) BOLUS VIA INFUSION
15.0000 mg/kg | INTRAVENOUS | Status: AC
  Administered 2019-04-13: 1492.5 mg via INTRAVENOUS
  Filled 2019-04-12: qty 1493

## 2019-04-12 NOTE — Progress Notes (Addendum)
Advanced Heart Failure Rounding Note  PCP-Cardiologist: No primary care provider on file.   Subjective:    Sitting up in bed eating breakfast. Feels better today, got decent sleep last night. No symptoms at rest but gets SOB w/ activity.   Continues to complaint of left neck/jaw pain, reproducible w/ palpation.   Continues to diurese well w/ IV Lasix and metolazone, -2.5L out yesterday. SCr and K stable. Overall, down 21 lb.   Plan for MVR via sternotomy tomorrow./   Objective:   Weight Range: 99.5 kg Body mass index is 33.36 kg/m.   Vital Signs:   Temp:  [98.1 F (36.7 C)-98.8 F (37.1 C)] 98.1 F (36.7 C) (03/04 0507) Pulse Rate:  [68-87] 68 (03/04 0507) Resp:  [18-23] 19 (03/04 0048) BP: (95-147)/(55-123) 95/55 (03/04 0507) SpO2:  [90 %-98 %] 91 % (03/04 0507) Weight:  [99.5 kg-101.7 kg] 99.5 kg (03/04 0456) Last BM Date: 04/09/19  Weight change: Filed Weights   04/10/19 1434 04/11/19 2000 04/12/19 0456  Weight: 102.3 kg 101.7 kg 99.5 kg    Intake/Output:   Intake/Output Summary (Last 24 hours) at 04/12/2019 0706 Last data filed at 04/12/2019 0445 Gross per 24 hour  Intake 240 ml  Output 2450 ml  Net -2210 ml      Physical Exam    PHYSICAL EXAM: General:  Well appearing, moderately obese WF. No respiratory difficulty HEENT: normal Neck: supple. no JVD. Carotids 2+ bilat; no bruits. No lymphadenopathy or thyromegaly appreciated. Cor: PMI nondisplaced. Regular rate & rhythm. 3/6 SM (MR) heard throughout the precordium  Lungs: clear Abdomen: soft, nontender, nondistended. No hepatosplenomegaly. No bruits or masses. Good bowel sounds. Extremities: no cyanosis, clubbing, rash, edema, bilateral ted hoses  Neuro: alert & oriented x 3, cranial nerves grossly intact. moves all 4 extremities w/o difficulty. Affect pleasant.     Telemetry   NSR mid  70s personally reviewed.   EKG    N/A  Labs    CBC Recent Labs    04/10/19 1128 04/11/19 0515    WBC  --  14.7*  NEUTROABS  --  11.7*  HGB 12.9 12.2  HCT 38.0 40.3  MCV  --  95.7  PLT  --  123456   Basic Metabolic Panel Recent Labs    04/11/19 0515 04/12/19 0326  NA 139 139  K 4.2 3.9  CL 97* 93*  CO2 34* 37*  GLUCOSE 120* 104*  BUN 24* 23  CREATININE 1.22* 1.28*  CALCIUM 9.0 9.1   Liver Function Tests Recent Labs    04/12/19 0326  AST 23  ALT 38  ALKPHOS 72  BILITOT 2.1*  PROT 5.9*  ALBUMIN 3.5   No results for input(s): LIPASE, AMYLASE in the last 72 hours. Cardiac Enzymes No results for input(s): CKTOTAL, CKMB, CKMBINDEX, TROPONINI in the last 72 hours.  BNP: BNP (last 3 results) No results for input(s): BNP in the last 8760 hours.  ProBNP (last 3 results) No results for input(s): PROBNP in the last 8760 hours.   D-Dimer No results for input(s): DDIMER in the last 72 hours. Hemoglobin A1C Recent Labs    04/12/19 0326  HGBA1C 6.3*   Fasting Lipid Panel No results for input(s): CHOL, HDL, LDLCALC, TRIG, CHOLHDL, LDLDIRECT in the last 72 hours. Thyroid Function Tests No results for input(s): TSH, T4TOTAL, T3FREE, THYROIDAB in the last 72 hours.  Invalid input(s): FREET3  Other results:   Imaging    DG Orthopantogram  Result Date: 04/11/2019 CLINICAL  DATA:  Pre operative exam. The patient is to have heart surgery. EXAM: ORTHOPANTOGRAM/PANORAMIC COMPARISON:  None. FINDINGS: There is a chronically impacted right mandibular third molar. Multiple metallic restorations. No evidence of periapical abscesses or dental caries. Bones of the mandible and maxilla appear normal. IMPRESSION: No significant abnormalities. Specifically, no evidence of dental abscess or caries. Electronically Signed   By: Lorriane Shire M.D.   On: 04/11/2019 12:24   DG Chest 2 View  Result Date: 04/11/2019 CLINICAL DATA:  Preop heart surgery. EXAM: CHEST - 2 VIEW COMPARISON:  04/03/2019 FINDINGS: Cardiac enlargement. Trace pleural effusions and mild diffuse pulmonary vascular  congestion noted. When compared with the previous exam the right pleural effusion has decreased in volume. No new findings. IMPRESSION: Decrease in volume of right pleural effusion. Electronically Signed   By: Kerby Moors M.D.   On: 04/11/2019 12:26     Medications:     Scheduled Medications: . aspirin EC  81 mg Oral QHS  . atorvastatin  40 mg Oral QHS  . azelastine  2 spray Each Nare QHS  . chlorhexidine  15 mL Mouth/Throat BID  . enoxaparin (LOVENOX) injection  40 mg Subcutaneous Q24H  . FLUoxetine  20 mg Oral q1800  . fluticasone  2 spray Each Nare QHS  . furosemide  80 mg Intravenous BID  . hydroxychloroquine  200 mg Oral BID  . levothyroxine  125 mcg Oral QAC breakfast  . liothyronine  5 mcg Oral Daily  . metoprolol tartrate  12.5 mg Oral q AM   And  . metoprolol tartrate  50 mg Oral QHS  . pantoprazole  40 mg Oral Daily  . sodium chloride flush  10-40 mL Intracatheter Q12H  . sodium chloride flush  3 mL Intravenous Q12H  . spironolactone  12.5 mg Oral Daily    Infusions: . sodium chloride      PRN Medications: sodium chloride, acetaminophen, albuterol, nicotine polacrilex, ondansetron (ZOFRAN) IV, sodium chloride flush, sodium chloride flush, traZODone   Assessment/Plan   1. A/C Diastolic Heart Failure -ECHO 03/2019 EF 60-65% grade II DD  -Elevated filling pressures on cath.  -Volume improved w/ IV Lasix, overall down 21 lb. SCr/K stable - Give another dose IV Lasix this am - Will continue to follow post op   2. Mitral Regurgitation  - ECHO/TEE with severe MR P2 prolapse - tentatively scheduled for minimally invasive MV repair w/ Dr. Roxy Manns 3/5  3.OSA - Followed by Dr Annamaria Boots. Hasnt used CPAP in 1 year.   4. H/o lupus and Sjogren's disease - followed at [facility] - her neck/ jaw pain in the area of the Stensen's duct may possibly be related to Sjogren's syndrome and salivary gland involvement. Consider ENT consultation if symptoms persists      Length of Stay: 2  Lyda Jester, PA-C  04/12/2019, 7:06 AM  Advanced Heart Failure Team Pager [phone] (M-F; 7a - 4p)  Please contact [facility] for night-coverage after hours (4p -7a ) and weekends on [email or web address]  Patient seen and examined with the above-signed Advanced Practice Provider and/or Housestaff. I personally reviewed laboratory data, imaging studies and relevant notes. I independently examined the patient and formulated the important aspects of the plan. I have edited the note to reflect any of my changes or salient points. I have personally discussed the plan with the patient and/or family.  Diuresing well. Breathing better. PFT with significant COPD and will require open sternotomy. Still c/o pain in left submandibular region. Dental w/u unrevealing. +  constipated. No CP.   Lying in bed NAD JVP 7-8  Tender to palpation in left submandibular region Cor RRR 3/6 MR Lungs decreased BS throughout Ab obese NT Ext 1+edema  Diuresing well. Still volume overloaded. Continue IV lasix. Discussed neck pain with ENT. CT obtained and no clear abnormality,. Pre-op testing reviewed. Has signfiicant COPD. Will need open sternotomy for MVR tomorrow. D/w Dr. Roxy Manns at bedside. She sholud be ready for OR in am.   Glori Bickers, MD  10:30 PM

## 2019-04-12 NOTE — Progress Notes (Signed)
CARDIAC REHAB PHASE I   PRE:  Rate/Rhythm: 50 SR  BP:  Supine: 109/63  Sitting:   Standing:    SaO2: 92%RA  MODE:  Ambulation: 200 ft   POST:  Rate/Rhythm: 86 SR  BP:  Supine:   Sitting: 118/77  Standing:    SaO2: 90% RA after resting 1029-1105 Pt walked 200 ft on RA with steady gait. To recliner with call bell after walk. Discussed with pt the importance of IS and walking after surgery. Gave IS and pt demonstrated 1250 ml correctly. Reviewed staying in the tube and sternal precautions. Gave OHS booklet and staying in tube handout. Wrote down how to view pre op video but pt does not want to watch.  Son will be available after discharge to assist with care.    Graylon Good, RN BSN  04/12/2019 11:18 AM   67 SR

## 2019-04-12 NOTE — Progress Notes (Signed)
[address]       [place] [address]             [phone]     CARDIOTHORACIC SURGERY PROGRESS NOTE  2 Days Post-Op  S/P Procedure(s) (LRB): TRANSESOPHAGEAL ECHOCARDIOGRAM (TEE) (N/A) BUBBLE STUDY  Subjective: No complaints.  Denies SOB  Objective: Vital signs in last 24 hours: Temp:  [98.1 F (36.7 C)-98.8 F (37.1 C)] 98.1 F (36.7 C) (03/04 0507) Pulse Rate:  [68-87] 68 (03/04 0507) Cardiac Rhythm: Normal sinus rhythm (03/03 1916) Resp:  [18-19] 19 (03/04 0048) BP: (95-147)/(50-123) 100/50 (03/04 0825) SpO2:  [90 %-98 %] 91 % (03/04 0507) Weight:  [99.5 kg-101.7 kg] 99.5 kg (03/04 0456)  Physical Exam:  Rhythm:   sinus  Breath sounds: clear  Heart sounds:  RRR w/ systolic murmur  Incisions:  n/a  Abdomen:  Soft, non-distended, non-tender  Extremities:  Warm, well-perfused    Intake/Output from previous day: 03/03 0701 - 03/04 0700 In: 240 [P.O.:240] Out: 2450 [Urine:2450] Intake/Output this shift: No intake/output data recorded.  Lab Results:  Results for KAZI, MALIA (MRN [id number]) as of 04/12/2019 08:34  Ref. Range 04/12/2019 04:30  Sample type Unknown ARTERIAL  FIO2 Unknown 21.00  pH, Arterial Latest Ref Range: 7.350 - 7.450  7.480 (H)  pCO2 arterial Latest Ref Range: 32.0 - 48.0 mmHg 54.9 (H)  pO2, Arterial Latest Ref Range: 83.0 - 108.0 mmHg 51.3 (L)  Acid-Base Excess Latest Ref Range: 0.0 - 2.0 mmol/L 15.7 (H)  Bicarbonate Latest Ref Range: 20.0 - 28.0 mmol/L 40.5 (H)  O2 Saturation Latest Units: % 86.1  Patient temperature Unknown 36.8  Collection site Unknown LEFT RADIAL    Recent Labs    04/10/19 1128 04/11/19 0515  WBC  --  14.7*  HGB 12.9 12.2  HCT 38.0 40.3  PLT  --  201   BMET:  Recent Labs    04/11/19 0515 04/12/19 0326  NA 139 139  K 4.2 3.9  CL 97* 93*  CO2 34* 37*  GLUCOSE 120* 104*  BUN 24* 23  CREATININE 1.22* 1.28*  CALCIUM 9.0 9.1    CBG (last 3)  No results for input(s): GLUCAP in  the last 72 hours. PT/INR:  No results for input(s): LABPROT, INR in the last 72 hours.  CXR:  CHEST - 2 VIEW  COMPARISON:  04/03/2019  FINDINGS: Cardiac enlargement. Trace pleural effusions and mild diffuse pulmonary vascular congestion noted. When compared with the previous exam the right pleural effusion has decreased in volume. No new findings.  IMPRESSION: Decrease in volume of right pleural effusion.   Electronically Signed   By: Kerby Moors M.D.   On: 04/11/2019 12:26  Assessment/Plan: S/P Procedure(s) (LRB): TRANSESOPHAGEAL ECHOCARDIOGRAM (TEE) (N/A) BUBBLE STUDY  PFT's c/w severe COPD and ABG on room air w/ hypercarbia and significant hypoxemia at baseline.  CXR w/out signs of pulmonary edema.  Under the circumstances patient will be at increased risk for post op respiratory complications including hypoxemic respiratory failure and pneumonia.  Under the circumstances I feel that mitral valve repair might best be performed via conventional sternotomy rather than using minimally invasive approach.  We can plan to proceed with surgery tomorrow if Advanced Heart Failure team feels that there would be little to gain from more prolonged preoperative medical therapy.  The patient was again counseled at length regarding the indications, risks and potential benefits of mitral valve repair.  The rationale for elective surgery has been  explained, including a comparison between surgery and continued medical therapy with close follow-up.  The likelihood of successful and durable mitral valve repair has been discussed with particular reference to the findings of their recent echocardiogram.  Based upon these findings and previous experience, I have quoted them a greater than 95 percent likelihood of successful valve repair with less than 5 percent risk of mortality or major morbidity.  In the unlikely event that the patient's valve cannot be successfully repaired, we discussed the  possibility of replacing the mitral valve using a mechanical prosthesis with the attendant need for long-term anticoagulation versus the alternative of replacing it using a bioprosthetic tissue valve with its potential for late structural valve deterioration and failure, depending upon the patient's longevity.  The patient specifically requests that if the mitral valve must be replaced that it be done using a bioprosthetic tissue valve.     The patient understands and accepts all potential risks of surgery including but not limited to risk of death, stroke or other neurologic complication, myocardial infarction, congestive heart failure, respiratory failure, renal failure, bleeding requiring transfusion and/or reexploration, arrhythmia, infection or other wound complications, pneumonia, pleural and/or pericardial effusion, pulmonary embolus, aortic dissection or other major vascular complication, or delayed complications related to valve repair or replacement including but not limited to structural valve deterioration and failure, thrombosis, embolization, endocarditis, or paravalvular leak.   All of her questions have been answered.   I spent in excess of 30 minutes during the conduct of this hospital encounter and >50% of this time involved direct face-to-face encounter with the patient for counseling and/or coordination of their care.   Rexene Alberts, MD 04/12/2019 8:34 AM

## 2019-04-12 NOTE — Consult Note (Signed)
Reason for Consult: Left neck swelling and tenderness  HPI:  Rebecca Carr is an 69 y.o. female with a history of severe mitral regurgitation.  Patient is scheduled to undergo heart valve surgery later this week. She also has a history of Sjogren's disease. Pt now has a new complaint of left submandibular swelling and tenderness for 2 days. She is currently on lasix for diuresis. She is tolerating oral intake well. She denies any significant history of sialadenitis or sialolithiasis.  Past Medical History:  Diagnosis Date  . Antiphospholipid syndrome ([place])   . Anxiety   . Arthritis   . Clotting disorder ([place])   . DES exposure in utero   . Diverticulosis of colon   . Dizziness   . Environmental and seasonal allergies   . GERD (gastroesophageal reflux disease)   . Hashimoto's thyroiditis   . Heart palpitations   . Hiatal hernia   . History of adenomatous polyp of colon    hyperplastic 2015  and tubular adenoma 2005  . History of esophageal dilatation    of stricture  . History of esophagitis    LA class , grade B and Gastritis  . History of pituitary tumor    dx 1990's - asymptomatic /  per pt last scan 2005 approx.  resolved   . Hyperlipidemia   . Hypothyroidism   . Leg pain, bilateral   . Mild intermittent asthma   . Moderate mitral regurgitation by prior echocardiography 2012   1-2 MR on Echo  . MTHFR mutation ([place])   . MVP (mitral valve prolapse)    Mild calcified posterior leaflet prolapse on echo July 2018.  Mild MR.  . OSA (obstructive sleep apnea)    non-compliant cpap  . Prediabetes   . SUI (stress urinary incontinence, female)   . VIN III (vulvar intraepithelial neoplasia III)   . Wears glasses     Past Surgical History:  Procedure Laterality Date  . BUBBLE STUDY  04/10/2019   Procedure: BUBBLE STUDY;  Surgeon: Sueanne Margarita, MD;  Location: [facility] ENDOSCOPY;  Service: Cardiovascular;;  . CARDIAC EVENT MONITOR  July-August 2018   Normal sinus rhythm with sinus  tachycardia.  Average heart rate 91 bpm.  Occasional PVCs.  One 12 and another 6 beat run of wide-complex tachycardia.  2 morphology PVCs.PVCs noted, but not during longer episode of WCT.  Marland Kitchen CESAREAN SECTION  1994  . CO2 LASER APPLICATION N/A 2/70/6237   Procedure: CO2 LASER APPLICATION ;  Surgeon: Everitt Amber, MD;  Location: [facility];  Service: Gynecology;  Laterality: N/A;  . COLONOSCOPY, ESOPHAGOGASTRODUODENOSCOPY (EGD) AND ESOPHAGEAL DILATION  10-04-2013  . LAPAROSCOPIC CHOLECYSTECTOMY  1995  . LASER ABLATION CONDOLAMATA N/A 07/03/2015   Procedure: CO2 LASER OF VULVA ;  Surgeon: Everitt Amber, MD;  Location: [facility];  Service: Gynecology;  Laterality: N/A;  . NM MYOVIEW LTD  10/2016   LOW RISK.  No ischemia or infarction.  EF 54%.  Marland Kitchen RIGHT/LEFT HEART CATH AND CORONARY ANGIOGRAPHY N/A 04/10/2019   Procedure: RIGHT/LEFT HEART CATH AND CORONARY ANGIOGRAPHY;  Surgeon: Leonie Man, MD;  Location: [place] CV LAB;  Service: Cardiovascular;  Laterality: N/A;  . TEE WITHOUT CARDIOVERSION N/A 04/10/2019   Procedure: TRANSESOPHAGEAL ECHOCARDIOGRAM (TEE);  Surgeon: Sueanne Margarita, MD;  Location: [facility] ENDOSCOPY;  Service: Cardiovascular;  Laterality: N/A;  . TRANSTHORACIC ECHOCARDIOGRAM  11/2010   Normal LV Size & Fxn - EF 55-60%. Mild-Mod MR. Normal DF.  Marland Kitchen TRANSTHORACIC ECHOCARDIOGRAM  08/2016   EF 60 of 65%.  Severe LVH.  Pseudo-normal filling (grade 2 diastolic dysfunction).  MAC with calcified mobile attachments.  Posterior leaflet prolapse.  Mild regurgitation.  Moderate LA dilation.    Family History  Problem Relation Age of Onset  . Pancreatic cancer Father   . Colon cancer Father   . Heart attack Mother   . Heart disease Mother   . Heart attack Brother   . COPD Brother   . Dementia Brother   . Colon cancer Maternal Grandfather   . Rectal cancer Neg Hx   . Stomach cancer Neg Hx   . Esophageal cancer Neg Hx     Social History:  reports that she quit  smoking about 4 years ago. Her smoking use included cigarettes. She has a 48.00 pack-year smoking history. She has never used smokeless tobacco. She reports current alcohol use of about 2.0 standard drinks of alcohol per week. She reports that she does not use drugs.  Allergies:  Allergies  Allergen Reactions  . Iodine Anaphylaxis  . Iohexol Anaphylaxis     Desc: Patient states she is allergic to iodine and had a "code blue" incident after an injection for some type of imaging study.   . Penicillins Rash    PCN IN LARGE DOSES. Did it involve swelling of the face/tongue/throat, SOB, or low BP? No Did it involve sudden or severe rash/hives, skin peeling, or any reaction on the inside of your mouth or nose? No Did you need to seek medical attention at a hospital or doctor's office? No When did it last happen? More than 10 years ago If all above answers are "NO", may proceed with cephalosporin use.     Prior to Admission medications   Medication Sig Start Date End Date Taking? Authorizing Provider  ALBUTEROL IN Inhale 1-2 puffs into the lungs every 6 (six) hours as needed.    Yes [provider]  aspirin EC 81 MG tablet Take 81 mg by mouth at bedtime. Reported on 07/16/2015   Yes [provider]  atorvastatin (LIPITOR) 40 MG tablet Take 40 mg by mouth at bedtime.    Yes [provider]  azelastine (ASTELIN) 0.1 % nasal spray Place 2 sprays into both nostrils at bedtime.  07/14/16  Yes [provider]  Cyanocobalamin (VITAMIN B-12) 5000 MCG LOZG Take 5,000 mcg by mouth daily.   Yes [provider]  FLUoxetine (PROZAC) 20 MG capsule Take 20 mg by mouth daily at 6 PM. (1700)   Yes [provider]  fluticasone (FLONASE) 50 MCG/ACT nasal spray Place 2 sprays into both nostrils at bedtime.    Yes [provider]  furosemide (LASIX) 40 MG tablet TAKE 20 MG TO 40 MG TWICE A DAY AS NEEDED FOR SWELLING Patient taking differently: Take 20 mg  by mouth 2 (two) times daily as needed (swelling.).  03/28/19  Yes Leonie Man, MD  hydroxychloroquine (PLAQUENIL) 200 MG tablet Take 200 mg by mouth 2 (two) times daily.  07/03/18  Yes [provider]  Javier Docker Oil 500 MG CAPS Take 500 mg by mouth daily.   Yes [provider]  levothyroxine (SYNTHROID) 125 MCG tablet Take 125 mcg by mouth daily before breakfast.   Yes [provider]  liothyronine (CYTOMEL) 5 MCG tablet Take 5 mcg by mouth daily. 07/07/17  Yes [provider]  metoprolol tartrate (LOPRESSOR) 25 MG tablet TAKE 1/2 TABLET (12.5 MG) IN THE MORNING AND 2 TABLETS (50 MG)  AT NIGHT. Patient taking differently: Take 12.5-50 mg by mouth See admin instructions. TAKE 1/2 TABLET (12.5 MG) IN THE MORNING AND 2 TABLETS (50 MG) AT NIGHT. 03/28/19  Yes Leonie Man, MD  Multiple Vitamin (MULTIVITAMIN WITH MINERALS) TABS tablet Take 1 tablet by mouth daily. Multivitamin Essentials   Yes [provider]  neomycin-bacitracin-polymyxin (NEOSPORIN) ointment Apply 1 application topically as needed for wound care (applied to affected area of foot.).   Yes [provider]  nicotine polacrilex (NICORETTE) 2 MG gum Take 2 mg by mouth as needed for smoking cessation.   Yes [provider]  omeprazole (PRILOSEC) 40 MG capsule TAKE 1 CAPSULE(40 MG) BY MOUTH TWICE DAILY Patient taking differently: Take 40 mg by mouth daily before breakfast.  02/05/19  Yes Ladene Artist, MD  predniSONE (DELTASONE) 50 MG tablet Contrast Allergy: Yes, Please take Prednisone 75m by mouth at: Thirteen hours prior to cath 9:00pm on Monday,Seven hours prior to cath 3:00am on Tuesday And prior to leaving home please take last dose of Prednisone 544mand Benadryl 5075my mouth. 6 AM 03/28/19  Yes HarLeonie ManD    Medications:  I have reviewed the patient's current medications. Scheduled: . aspirin EC  81 mg Oral QHS  . atorvastatin  40 mg Oral QHS  .  azelastine  2 spray Each Nare QHS  . chlorhexidine  15 mL Mouth/Throat BID  . enoxaparin (LOVENOX) injection  40 mg Subcutaneous Q24H  . FLUoxetine  20 mg Oral q1800  . fluticasone  2 spray Each Nare QHS  . furosemide  80 mg Intravenous BID  . hydroxychloroquine  200 mg Oral BID  . levothyroxine  125 mcg Oral QAC breakfast  . liothyronine  5 mcg Oral Daily  . metoprolol tartrate  12.5 mg Oral q AM   And  . metoprolol tartrate  50 mg Oral QHS  . pantoprazole  40 mg Oral Daily  . sodium chloride flush  10-40 mL Intracatheter Q12H  . sodium chloride flush  3 mL Intravenous Q12H  . spironolactone  12.5 mg Oral Daily   Continuous: . sodium chloride      Results for orders placed or performed during the hospital encounter of 04/10/19 (from the past 48 hour(s))  POCT I-Stat EG7     Status: Abnormal   Collection Time: 04/10/19 11:17 AM  Result Value Ref Range   pH, Ven 7.294 7.250 - 7.430   pCO2, Ven 64.0 (H) 44.0 - 60.0 mmHg   pO2, Ven 39.0 32.0 - 45.0 mmHg   Bicarbonate 31.1 (H) 20.0 - 28.0 mmol/L   TCO2 33 (H) 22 - 32 mmol/L   O2 Saturation 66.0 %   Acid-Base Excess 3.0 (H) 0.0 - 2.0 mmol/L   Sodium 142 135 - 145 mmol/L   Potassium 4.4 3.5 - 5.1 mmol/L   Calcium, Ion 1.22 1.15 - 1.40 mmol/L   HCT 38.0 36.0 - 46.0 %   Hemoglobin 12.9 12.0 - 15.0 g/dL   Patient temperature HIDE    Sample type VENOUS    Comment NOTIFIED PHYSICIAN   POCT I-Stat EG7     Status: Abnormal   Collection Time: 04/10/19 11:17 AM  Result Value Ref Range   pH, Ven 7.304 7.250 - 7.430   pCO2, Ven 62.9 (H) 44.0 - 60.0 mmHg   pO2, Ven 39.0 32.0 - 45.0 mmHg   Bicarbonate 31.2 (H) 20.0 - 28.0 mmol/L   TCO2 33 (H) 22 - 32 mmol/L   O2  Saturation 67.0 %   Acid-Base Excess 3.0 (H) 0.0 - 2.0 mmol/L   Sodium 142 135 - 145 mmol/L   Potassium 4.3 3.5 - 5.1 mmol/L   Calcium, Ion 1.21 1.15 - 1.40 mmol/L   HCT 39.0 36.0 - 46.0 %   Hemoglobin 13.3 12.0 - 15.0 g/dL   Patient temperature HIDE    Sample type VENOUS     Comment NOTIFIED PHYSICIAN   I-STAT 7, (LYTES, BLD GAS, ICA, H+H)     Status: Abnormal   Collection Time: 04/10/19 11:28 AM  Result Value Ref Range   pH, Arterial 7.314 (L) 7.350 - 7.450   pCO2 arterial 58.1 (H) 32.0 - 48.0 mmHg   pO2, Arterial 116.0 (H) 83.0 - 108.0 mmHg   Bicarbonate 29.5 (H) 20.0 - 28.0 mmol/L   TCO2 31 22 - 32 mmol/L   O2 Saturation 98.0 %   Acid-Base Excess 2.0 0.0 - 2.0 mmol/L   Sodium 141 135 - 145 mmol/L   Potassium 4.3 3.5 - 5.1 mmol/L   Calcium, Ion 1.22 1.15 - 1.40 mmol/L   HCT 38.0 36.0 - 46.0 %   Hemoglobin 12.9 12.0 - 15.0 g/dL   Patient temperature HIDE    Sample type ARTERIAL   CBC with Differential/Platelet     Status: Abnormal   Collection Time: 04/11/19  5:15 AM  Result Value Ref Range   WBC 14.7 (H) 4.0 - 10.5 K/uL   RBC 4.21 3.87 - 5.11 MIL/uL   Hemoglobin 12.2 12.0 - 15.0 g/dL   HCT 40.3 36.0 - 46.0 %   MCV 95.7 80.0 - 100.0 fL   MCH 29.0 26.0 - 34.0 pg   MCHC 30.3 30.0 - 36.0 g/dL   RDW 13.7 11.5 - 15.5 %   Platelets 201 150 - 400 K/uL   nRBC 0.0 0.0 - 0.2 %   Neutrophils Relative % 80 %   Neutro Abs 11.7 (H) 1.7 - 7.7 K/uL   Lymphocytes Relative 13 %   Lymphs Abs 1.9 0.7 - 4.0 K/uL   Monocytes Relative 7 %   Monocytes Absolute 1.0 0.1 - 1.0 K/uL   Eosinophils Relative 0 %   Eosinophils Absolute 0.0 0.0 - 0.5 K/uL   Basophils Relative 0 %   Basophils Absolute 0.0 0.0 - 0.1 K/uL   Immature Granulocytes 0 %   Abs Immature Granulocytes 0.06 0.00 - 0.07 K/uL    Comment: Performed at [facility], [address] [place], [place] [address]  Basic metabolic panel     Status: Abnormal   Collection Time: 04/11/19  5:15 AM  Result Value Ref Range   Sodium 139 135 - 145 mmol/L   Potassium 4.2 3.5 - 5.1 mmol/L   Chloride 97 (L) 98 - 111 mmol/L   CO2 34 (H) 22 - 32 mmol/L   Glucose, Bld 120 (H) 70 - 99 mg/dL    Comment: Glucose reference range applies only to samples taken after fasting for at least 8 hours.   BUN 24 (H) 8 -  23 mg/dL   Creatinine, Ser 1.22 (H) 0.44 - 1.00 mg/dL   Calcium 9.0 8.9 - 10.3 mg/dL   GFR calc non Af Amer 45 (L) >60 mL/min   GFR calc Af Amer 53 (L) >60 mL/min   Anion gap 8 5 - 15    Comment: Performed at [facility] [address], [place], [place] [address]  Urinalysis, Complete w Microscopic     Status: None  Collection Time: 04/11/19  1:44 PM  Result Value Ref Range   Color, Urine YELLOW YELLOW   APPearance CLEAR CLEAR   Specific Gravity, Urine 1.026 1.005 - 1.030   pH 5.0 5.0 - 8.0   Glucose, UA NEGATIVE NEGATIVE mg/dL   Hgb urine dipstick NEGATIVE NEGATIVE   Bilirubin Urine NEGATIVE NEGATIVE   Ketones, ur NEGATIVE NEGATIVE mg/dL   Protein, ur NEGATIVE NEGATIVE mg/dL   Nitrite NEGATIVE NEGATIVE   Leukocytes,Ua NEGATIVE NEGATIVE   RBC / HPF 0-5 0 - 5 RBC/hpf   Bacteria, UA NONE SEEN NONE SEEN   Squamous Epithelial / LPF 0-5 0 - 5   Hyaline Casts, UA PRESENT     Comment: Performed at [facility], [place] [address] [place], [place] [address]  Comprehensive metabolic panel     Status: Abnormal   Collection Time: 04/12/19  3:26 AM  Result Value Ref Range   Sodium 139 135 - 145 mmol/L   Potassium 3.9 3.5 - 5.1 mmol/L   Chloride 93 (L) 98 - 111 mmol/L   CO2 37 (H) 22 - 32 mmol/L   Glucose, Bld 104 (H) 70 - 99 mg/dL    Comment: Glucose reference range applies only to samples taken after fasting for at least 8 hours.   BUN 23 8 - 23 mg/dL   Creatinine, Ser 1.28 (H) 0.44 - 1.00 mg/dL   Calcium 9.1 8.9 - 10.3 mg/dL   Total Protein 5.9 (L) 6.5 - 8.1 g/dL   Albumin 3.5 3.5 - 5.0 g/dL   AST 23 15 - 41 U/L   ALT 38 0 - 44 U/L   Alkaline Phosphatase 72 38 - 126 U/L   Total Bilirubin 2.1 (H) 0.3 - 1.2 mg/dL   GFR calc non Af Amer 43 (L) >60 mL/min   GFR calc Af Amer 50 (L) >60 mL/min   Anion gap 9 5 - 15    Comment: Performed at [facility] [address], [place], [place] [address]  Prealbumin     Status: None   Collection Time: 04/12/19  3:26 AM   Result Value Ref Range   Prealbumin 21.4 18 - 38 mg/dL    Comment: Performed at [facility] [address], [place], [place] [address]  Hemoglobin A1c     Status: Abnormal   Collection Time: 04/12/19  3:26 AM  Result Value Ref Range   Hgb A1c MFr Bld 6.3 (H) 4.8 - 5.6 %    Comment: (NOTE) Pre diabetes:          5.7%-6.4% Diabetes:              >6.4% Glycemic control for   <7.0% adults with diabetes    Mean Plasma Glucose 134.11 mg/dL    Comment: Performed at [facility] [address] [place], [place] [address]  Blood gas, arterial     Status: Abnormal   Collection Time: 04/12/19  4:30 AM  Result Value Ref Range   FIO2 21.00    pH, Arterial 7.480 (H) 7.350 - 7.450   pCO2 arterial 54.9 (H) 32.0 - 48.0 mmHg   pO2, Arterial 51.3 (L) 83.0 - 108.0 mmHg   Bicarbonate 40.5 (H) 20.0 - 28.0 mmol/L   Acid-Base Excess 15.7 (H) 0.0 - 2.0 mmol/L   O2 Saturation 86.1 %   Patient temperature 36.8    Collection site LEFT RADIAL    Drawn by [id number]     Comment: COLLECTED BY RT  Sample type ARTERIAL    Allens test (pass/fail) PASS PASS    Comment: Performed at [facility], [place] [address] [place], [place] [address]  Type and screen [place]     Status: None   Collection Time: 04/12/19  5:08 AM  Result Value Ref Range   ABO/RH(D) O POS    Antibody Screen NEG    Sample Expiration      04/15/2019,2359 Performed at [facility], [place] [address], [place], [place] [address]   ABO/Rh     Status: None (Preliminary result)   Collection Time: 04/12/19  5:08 AM  Result Value Ref Range   ABO/RH(D)      O POS Performed at [facility] [address] [place], [place] [address]     DG Orthopantogram  Result Date: 04/11/2019 CLINICAL DATA:  Pre operative exam. The patient is to have heart surgery. EXAM: ORTHOPANTOGRAM/PANORAMIC COMPARISON:  None. FINDINGS: There is a chronically impacted right mandibular third molar. Multiple metallic  restorations. No evidence of periapical abscesses or dental caries. Bones of the mandible and maxilla appear normal. IMPRESSION: No significant abnormalities. Specifically, no evidence of dental abscess or caries. Electronically Signed   By: Lorriane Shire M.D.   On: 04/11/2019 12:24   DG Chest 2 View  Result Date: 04/11/2019 CLINICAL DATA:  Preop heart surgery. EXAM: CHEST - 2 VIEW COMPARISON:  04/03/2019 FINDINGS: Cardiac enlargement. Trace pleural effusions and mild diffuse pulmonary vascular congestion noted. When compared with the previous exam the right pleural effusion has decreased in volume. No new findings. IMPRESSION: Decrease in volume of right pleural effusion. Electronically Signed   By: Kerby Moors M.D.   On: 04/11/2019 12:26   CARDIAC CATHETERIZATION  Result Date: 04/10/2019  Hemodynamic findings consistent with MODERATE-SEVERE PULMONARY HYPERTENSION and SEVERE MITRAL VALVE REGURGITATION.  There is no aortic valve stenosis.  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  PCWP mean 33 mmHg.  RA mean 17 mmHg  Concern for worsening ACUTE ON CHRONIC DIASTOLIC HEART FAILURE RECOMMENDATION  Given the severity of the pulmonary hypertension and mitral valve regurgitation, the best course of action is to meet the patient for IV diuresis with plan for inpatient mitral valve repair.  I have discussed the patient with Dr. Aundra Dubin from advanced heart failure service was agreed to accept the patient on their service for IV diuresis.  We will start IV Lasix 80 mg twice daily  Continue current home dose beta-blocker and other home medications.  Blood pressure may be limited.  CVTS consultation has been placed. Glenetta Hew, MD  Review of Systems  Constitutional:  Positive for malaise/fatigue. HENT: Positive for congestion (with chronic drainage.). Negative for hearing loss and nosebleeds.   Eyes: Dry eyes. Mutiple eye infections  Respiratory: Positive for cough, shortness of breath and wheezing.   Gastrointestinal: Positive for heartburn. Negative for abdominal pain, blood in stool, melena and nausea.  Genitourinary: Negative for dysuria and hematuria.  Musculoskeletal: Positive for back pain, joint pain and myalgias. Negative for falls.  Has chronic pain  Neurological: Positive for dizziness and tingling. Negative for focal weakness, weakness and headaches.  Psychiatric/Behavioral: Positive for depression. Negative for memory loss. The patient is nervous/anxious.  Blood pressure (!) 100/50, pulse 68, temperature 98.1 F (  36.7 C), temperature source Oral, resp. rate 19, height _0  (1.727 m), weight 99.5 kg, SpO2 91 %. General:  Resting in bed. No acute distress. Eyes: Pupils are equal, round, reactive to light. Extraocular motion is intact.  Ears: Examination of the ears shows normal auricles and external auditory canals bilaterally.  Nose: Nasal examination shows normal mucosa, septum, turbinates.  Face: Facial examination shows no asymmetry. Palpation of the face elicit no significant tenderness.  Mouth: Oral cavity examination shows no mucosal lacerations. No significant trismus is noted. No palpable stone or purulent drainage from the salivary ducts. Neck: Palpation of the neck reveals mild left submandibular swelling and tenderness. The trachea is midline. The thyroid is not significantly enlarged.  Neuro: Cranial nerves 2-12 are all grossly in tact. Chest: No respiratory distress.  Abdomen: Soft, nontender, nondistended. No hepatosplenomegaly. No bruits or masses.  Extremities: No cyanosis, clubbing, rash, R and LLE 2+ edema Neuro: alert & oriented x 3, cranial nerves grossly intact. moves all 4 extremities w/o difficulty.    Assessment/Plan: Left submandibular tenderness and swelling, with possible sialadenitis. This may be exacerbated by dehydration and her history of Sjogren's disease. No palpable stone on exam. - Encourage hydration, if not contraindicated by her cardiac status. - In light of her pending valve surgery, recommend a neck CT scan to evaluate for any other occult infection and IV abx (e.g. clindamycin or Zosyn).  Ronique Simerly W Azalea Cedar 04/12/2019, 9:17 AM

## 2019-04-12 NOTE — Progress Notes (Signed)
Pre MVR exam completed.  Preliminary results can be found under CV proc under chart review.  04/12/2019 1:34 PM  Uzziah Rigg, K., RDMS, RVT

## 2019-04-12 NOTE — Anesthesia Preprocedure Evaluation (Addendum)
Anesthesia Evaluation  Patient identified by MRN, date of birth, ID band Patient awake    Reviewed: Allergy & Precautions, NPO status , Patient's Chart, lab work & pertinent test results, reviewed documented beta blocker date and time   History of Anesthesia Complications Negative for: history of anesthetic complications  Airway Mallampati: III  TM Distance: >3 FB Neck ROM: Full    Dental  (+) Dental Advisory Given   Pulmonary sleep apnea (does not use her cpap) , COPD,  COPD inhaler, former smoker (quit 2016),  04/06/2019 SARS coronavirus NEG   breath sounds clear to auscultation       Cardiovascular hypertension, Pt. on medications and Pt. on home beta blockers (-) angina(-) CAD  Rhythm:Regular Rate:Normal  04/10/2019 ECHO: EF 60-65%, severe MR with myxomatous MV, flail P2, flow reversal into pulm veins   Neuro/Psych Anxiety    GI/Hepatic Neg liver ROS, GERD  Medicated and Controlled,  Endo/Other  Hypothyroidism Morbid obesityobese  Renal/GU Renal InsufficiencyRenal disease (creat 1.28)     Musculoskeletal  (+) Arthritis ,   Abdominal (+) + obese,   Peds  Hematology Antiphospholipid syndrome   Anesthesia Other Findings Methylenetetrahydrofolate reductase deficiency  Reproductive/Obstetrics                           Anesthesia Physical Anesthesia Plan  ASA: IV  Anesthesia Plan: General   Post-op Pain Management:    Induction: Intravenous  PONV Risk Score and Plan: 3 and Treatment may vary due to age or medical condition  Airway Management Planned: Oral ETT and Video Laryngoscope Planned  Additional Equipment: Arterial line, PA Cath, 3D TEE and Ultrasound Guidance Line Placement  Intra-op Plan:   Post-operative Plan: Post-operative intubation/ventilation  Informed Consent: I have reviewed the patients History and Physical, chart, labs and discussed the procedure including the risks,  benefits and alternatives for the proposed anesthesia with the patient or authorized representative who has indicated his/her understanding and acceptance.     Dental advisory given  Plan Discussed with: CRNA and Surgeon  Anesthesia Plan Comments:       Anesthesia Quick Evaluation

## 2019-04-13 ENCOUNTER — Inpatient Hospital Stay (HOSPITAL_COMMUNITY): Payer: Medicare Other

## 2019-04-13 ENCOUNTER — Encounter (HOSPITAL_COMMUNITY)
Admission: RE | Disposition: A | Discharge status: Home Health | Payer: Self-pay | Source: Home / Self Care | Attending: Thoracic Surgery (Cardiothoracic Vascular Surgery)

## 2019-04-13 ENCOUNTER — Inpatient Hospital Stay (HOSPITAL_COMMUNITY): Payer: Medicare Other | Admitting: Anesthesiology

## 2019-04-13 ENCOUNTER — Encounter (HOSPITAL_COMMUNITY): Payer: Self-pay | Admitting: Cardiology

## 2019-04-13 DIAGNOSIS — J9601 Acute respiratory failure with hypoxia: Secondary | ICD-10-CM

## 2019-04-13 DIAGNOSIS — Z9889 Other specified postprocedural states: Secondary | ICD-10-CM

## 2019-04-13 DIAGNOSIS — I340 Nonrheumatic mitral (valve) insufficiency: Secondary | ICD-10-CM

## 2019-04-13 DIAGNOSIS — J449 Chronic obstructive pulmonary disease, unspecified: Secondary | ICD-10-CM | POA: Diagnosis present

## 2019-04-13 HISTORY — DX: Other specified postprocedural states: Z98.890

## 2019-04-13 HISTORY — PX: TEE WITHOUT CARDIOVERSION: SHX5443

## 2019-04-13 HISTORY — PX: CLIPPING OF ATRIAL APPENDAGE: SHX5773

## 2019-04-13 HISTORY — PX: MITRAL VALVE REPAIR: SHX2039

## 2019-04-13 LAB — CBC
HCT: 45.6 % (ref 36.0–46.0)
HCT: 31 % — ABNORMAL LOW (ref 36.0–46.0)
HCT: 25.8 % — ABNORMAL LOW (ref 36.0–46.0)
HCT: 24.1 % — ABNORMAL LOW (ref 36.0–46.0)
Hemoglobin: 14.1 g/dL (ref 12.0–15.0)
Hemoglobin: 10.1 g/dL — ABNORMAL LOW (ref 12.0–15.0)
Hemoglobin: 8.2 g/dL — ABNORMAL LOW (ref 12.0–15.0)
Hemoglobin: 7.5 g/dL — ABNORMAL LOW (ref 12.0–15.0)
MCH: 28.9 pg (ref 26.0–34.0)
MCH: 29.6 pg (ref 26.0–34.0)
MCH: 29.1 pg (ref 26.0–34.0)
MCH: 29 pg (ref 26.0–34.0)
MCHC: 30.9 g/dL (ref 30.0–36.0)
MCHC: 32.6 g/dL (ref 30.0–36.0)
MCHC: 31.8 g/dL (ref 30.0–36.0)
MCHC: 31.1 g/dL (ref 30.0–36.0)
MCV: 93.4 fL (ref 80.0–100.0)
MCV: 90.9 fL (ref 80.0–100.0)
MCV: 91.5 fL (ref 80.0–100.0)
MCV: 93.1 fL (ref 80.0–100.0)
Platelets: 218 10*3/uL (ref 150–400)
Platelets: 105 10*3/uL — ABNORMAL LOW (ref 150–400)
Platelets: 147 10*3/uL — ABNORMAL LOW (ref 150–400)
Platelets: 135 10*3/uL — ABNORMAL LOW (ref 150–400)
RBC: 4.88 MIL/uL (ref 3.87–5.11)
RBC: 3.41 MIL/uL — ABNORMAL LOW (ref 3.87–5.11)
RBC: 2.82 MIL/uL — ABNORMAL LOW (ref 3.87–5.11)
RBC: 2.59 MIL/uL — ABNORMAL LOW (ref 3.87–5.11)
RDW: 13.3 % (ref 11.5–15.5)
RDW: 12.9 % (ref 11.5–15.5)
RDW: 12.9 % (ref 11.5–15.5)
RDW: 13.1 % (ref 11.5–15.5)
WBC: 11.7 10*3/uL — ABNORMAL HIGH (ref 4.0–10.5)
WBC: 15.8 10*3/uL — ABNORMAL HIGH (ref 4.0–10.5)
WBC: 14.4 10*3/uL — ABNORMAL HIGH (ref 4.0–10.5)
WBC: 13 10*3/uL — ABNORMAL HIGH (ref 4.0–10.5)
nRBC: 0 % (ref 0.0–0.2)
nRBC: 0 % (ref 0.0–0.2)
nRBC: 0 % (ref 0.0–0.2)
nRBC: 0 % (ref 0.0–0.2)

## 2019-04-13 LAB — POCT I-STAT, CHEM 8
BUN: 23 mg/dL (ref 8–23)
BUN: 20 mg/dL (ref 8–23)
BUN: 22 mg/dL (ref 8–23)
BUN: 21 mg/dL (ref 8–23)
BUN: 20 mg/dL (ref 8–23)
BUN: 20 mg/dL (ref 8–23)
BUN: 21 mg/dL (ref 8–23)
Calcium, Ion: 1.11 mmol/L — ABNORMAL LOW (ref 1.15–1.40)
Calcium, Ion: 0.93 mmol/L — ABNORMAL LOW (ref 1.15–1.40)
Calcium, Ion: 1.06 mmol/L — ABNORMAL LOW (ref 1.15–1.40)
Calcium, Ion: 0.98 mmol/L — ABNORMAL LOW (ref 1.15–1.40)
Calcium, Ion: 0.98 mmol/L — ABNORMAL LOW (ref 1.15–1.40)
Calcium, Ion: 0.99 mmol/L — ABNORMAL LOW (ref 1.15–1.40)
Calcium, Ion: 1.2 mmol/L (ref 1.15–1.40)
Chloride: 85 mmol/L — ABNORMAL LOW (ref 98–111)
Chloride: 82 mmol/L — ABNORMAL LOW (ref 98–111)
Chloride: 86 mmol/L — ABNORMAL LOW (ref 98–111)
Chloride: 88 mmol/L — ABNORMAL LOW (ref 98–111)
Chloride: 92 mmol/L — ABNORMAL LOW (ref 98–111)
Chloride: 92 mmol/L — ABNORMAL LOW (ref 98–111)
Chloride: 92 mmol/L — ABNORMAL LOW (ref 98–111)
Creatinine, Ser: 1.1 mg/dL — ABNORMAL HIGH (ref 0.44–1.00)
Creatinine, Ser: 1 mg/dL (ref 0.44–1.00)
Creatinine, Ser: 1.1 mg/dL — ABNORMAL HIGH (ref 0.44–1.00)
Creatinine, Ser: 1 mg/dL (ref 0.44–1.00)
Creatinine, Ser: 1 mg/dL (ref 0.44–1.00)
Creatinine, Ser: 0.9 mg/dL (ref 0.44–1.00)
Creatinine, Ser: 0.9 mg/dL (ref 0.44–1.00)
Glucose, Bld: 150 mg/dL — ABNORMAL HIGH (ref 70–99)
Glucose, Bld: 116 mg/dL — ABNORMAL HIGH (ref 70–99)
Glucose, Bld: 137 mg/dL — ABNORMAL HIGH (ref 70–99)
Glucose, Bld: 170 mg/dL — ABNORMAL HIGH (ref 70–99)
Glucose, Bld: 161 mg/dL — ABNORMAL HIGH (ref 70–99)
Glucose, Bld: 136 mg/dL — ABNORMAL HIGH (ref 70–99)
Glucose, Bld: 154 mg/dL — ABNORMAL HIGH (ref 70–99)
HCT: 41 % (ref 36.0–46.0)
HCT: 30 % — ABNORMAL LOW (ref 36.0–46.0)
HCT: 40 % (ref 36.0–46.0)
HCT: 33 % — ABNORMAL LOW (ref 36.0–46.0)
HCT: 32 % — ABNORMAL LOW (ref 36.0–46.0)
HCT: 29 % — ABNORMAL LOW (ref 36.0–46.0)
HCT: 31 % — ABNORMAL LOW (ref 36.0–46.0)
Hemoglobin: 13.9 g/dL (ref 12.0–15.0)
Hemoglobin: 10.2 g/dL — ABNORMAL LOW (ref 12.0–15.0)
Hemoglobin: 13.6 g/dL (ref 12.0–15.0)
Hemoglobin: 11.2 g/dL — ABNORMAL LOW (ref 12.0–15.0)
Hemoglobin: 10.9 g/dL — ABNORMAL LOW (ref 12.0–15.0)
Hemoglobin: 10.5 g/dL — ABNORMAL LOW (ref 12.0–15.0)
Hemoglobin: 9.9 g/dL — ABNORMAL LOW (ref 12.0–15.0)
Potassium: 3.4 mmol/L — ABNORMAL LOW (ref 3.5–5.1)
Potassium: 2.9 mmol/L — ABNORMAL LOW (ref 3.5–5.1)
Potassium: 3.2 mmol/L — ABNORMAL LOW (ref 3.5–5.1)
Potassium: 3.7 mmol/L (ref 3.5–5.1)
Potassium: 3.3 mmol/L — ABNORMAL LOW (ref 3.5–5.1)
Potassium: 3.2 mmol/L — ABNORMAL LOW (ref 3.5–5.1)
Potassium: 3.6 mmol/L (ref 3.5–5.1)
Sodium: 134 mmol/L — ABNORMAL LOW (ref 135–145)
Sodium: 133 mmol/L — ABNORMAL LOW (ref 135–145)
Sodium: 134 mmol/L — ABNORMAL LOW (ref 135–145)
Sodium: 132 mmol/L — ABNORMAL LOW (ref 135–145)
Sodium: 134 mmol/L — ABNORMAL LOW (ref 135–145)
Sodium: 135 mmol/L (ref 135–145)
Sodium: 135 mmol/L (ref 135–145)
TCO2: 43 mmol/L — ABNORMAL HIGH (ref 22–32)
TCO2: 37 mmol/L — ABNORMAL HIGH (ref 22–32)
TCO2: 40 mmol/L — ABNORMAL HIGH (ref 22–32)
TCO2: 41 mmol/L — ABNORMAL HIGH (ref 22–32)
TCO2: 37 mmol/L — ABNORMAL HIGH (ref 22–32)
TCO2: 34 mmol/L — ABNORMAL HIGH (ref 22–32)
TCO2: 34 mmol/L — ABNORMAL HIGH (ref 22–32)

## 2019-04-13 LAB — POCT I-STAT 7, (LYTES, BLD GAS, ICA,H+H)
Acid-Base Excess: 19 mmol/L — ABNORMAL HIGH (ref 0.0–2.0)
Acid-Base Excess: 13 mmol/L — ABNORMAL HIGH (ref 0.0–2.0)
Acid-Base Excess: 10 mmol/L — ABNORMAL HIGH (ref 0.0–2.0)
Acid-Base Excess: 10 mmol/L — ABNORMAL HIGH (ref 0.0–2.0)
Bicarbonate: 44 mmol/L — ABNORMAL HIGH (ref 20.0–28.0)
Bicarbonate: 36.3 mmol/L — ABNORMAL HIGH (ref 20.0–28.0)
Bicarbonate: 33.6 mmol/L — ABNORMAL HIGH (ref 20.0–28.0)
Bicarbonate: 33.3 mmol/L — ABNORMAL HIGH (ref 20.0–28.0)
Calcium, Ion: 1.11 mmol/L — ABNORMAL LOW (ref 1.15–1.40)
Calcium, Ion: 0.91 mmol/L — ABNORMAL LOW (ref 1.15–1.40)
Calcium, Ion: 1.17 mmol/L (ref 1.15–1.40)
Calcium, Ion: 1.07 mmol/L — ABNORMAL LOW (ref 1.15–1.40)
HCT: 42 % (ref 36.0–46.0)
HCT: 32 % — ABNORMAL LOW (ref 36.0–46.0)
HCT: 31 % — ABNORMAL LOW (ref 36.0–46.0)
HCT: 30 % — ABNORMAL LOW (ref 36.0–46.0)
Hemoglobin: 14.3 g/dL (ref 12.0–15.0)
Hemoglobin: 10.9 g/dL — ABNORMAL LOW (ref 12.0–15.0)
Hemoglobin: 10.5 g/dL — ABNORMAL LOW (ref 12.0–15.0)
Hemoglobin: 10.2 g/dL — ABNORMAL LOW (ref 12.0–15.0)
O2 Saturation: 100 %
O2 Saturation: 100 %
O2 Saturation: 100 %
O2 Saturation: 96 %
Patient temperature: 97.7
Potassium: 3.4 mmol/L — ABNORMAL LOW (ref 3.5–5.1)
Potassium: 2.9 mmol/L — ABNORMAL LOW (ref 3.5–5.1)
Potassium: 3.2 mmol/L — ABNORMAL LOW (ref 3.5–5.1)
Potassium: 3.5 mmol/L (ref 3.5–5.1)
Sodium: 135 mmol/L (ref 135–145)
Sodium: 134 mmol/L — ABNORMAL LOW (ref 135–145)
Sodium: 135 mmol/L (ref 135–145)
Sodium: 136 mmol/L (ref 135–145)
TCO2: 45 mmol/L — ABNORMAL HIGH (ref 22–32)
TCO2: 38 mmol/L — ABNORMAL HIGH (ref 22–32)
TCO2: 35 mmol/L — ABNORMAL HIGH (ref 22–32)
TCO2: 35 mmol/L — ABNORMAL HIGH (ref 22–32)
pCO2 arterial: 50.4 mmHg — ABNORMAL HIGH (ref 32.0–48.0)
pCO2 arterial: 40.7 mmHg (ref 32.0–48.0)
pCO2 arterial: 42 mmHg (ref 32.0–48.0)
pCO2 arterial: 40.3 mmHg (ref 32.0–48.0)
pH, Arterial: 7.549 — ABNORMAL HIGH (ref 7.350–7.450)
pH, Arterial: 7.558 — ABNORMAL HIGH (ref 7.350–7.450)
pH, Arterial: 7.511 — ABNORMAL HIGH (ref 7.350–7.450)
pH, Arterial: 7.524 — ABNORMAL HIGH (ref 7.350–7.450)
pO2, Arterial: 433 mmHg — ABNORMAL HIGH (ref 83.0–108.0)
pO2, Arterial: 261 mmHg — ABNORMAL HIGH (ref 83.0–108.0)
pO2, Arterial: 166 mmHg — ABNORMAL HIGH (ref 83.0–108.0)
pO2, Arterial: 75 mmHg — ABNORMAL LOW (ref 83.0–108.0)

## 2019-04-13 LAB — GLUCOSE, CAPILLARY
Glucose-Capillary: 139 mg/dL — ABNORMAL HIGH (ref 70–99)
Glucose-Capillary: 152 mg/dL — ABNORMAL HIGH (ref 70–99)
Glucose-Capillary: 145 mg/dL — ABNORMAL HIGH (ref 70–99)
Glucose-Capillary: 138 mg/dL — ABNORMAL HIGH (ref 70–99)
Glucose-Capillary: 149 mg/dL — ABNORMAL HIGH (ref 70–99)
Glucose-Capillary: 155 mg/dL — ABNORMAL HIGH (ref 70–99)
Glucose-Capillary: 145 mg/dL — ABNORMAL HIGH (ref 70–99)
Glucose-Capillary: 149 mg/dL — ABNORMAL HIGH (ref 70–99)
Glucose-Capillary: 150 mg/dL — ABNORMAL HIGH (ref 70–99)

## 2019-04-13 LAB — BASIC METABOLIC PANEL
Anion gap: 12 (ref 5–15)
BUN: 25 mg/dL — ABNORMAL HIGH (ref 8–23)
CO2: 41 mmol/L — ABNORMAL HIGH (ref 22–32)
Calcium: 9.8 mg/dL (ref 8.9–10.3)
Chloride: 86 mmol/L — ABNORMAL LOW (ref 98–111)
Creatinine, Ser: 1.44 mg/dL — ABNORMAL HIGH (ref 0.44–1.00)
GFR calc Af Amer: 43 mL/min — ABNORMAL LOW (ref >60)
GFR calc non Af Amer: 37 mL/min — ABNORMAL LOW (ref >60)
Glucose, Bld: 103 mg/dL — ABNORMAL HIGH (ref 70–99)
Potassium: 4.2 mmol/L (ref 3.5–5.1)
Sodium: 139 mmol/L (ref 135–145)

## 2019-04-13 LAB — FIBRINOGEN: Fibrinogen: 252 mg/dL (ref 210–475)

## 2019-04-13 LAB — HEMOGLOBIN AND HEMATOCRIT, BLOOD
HCT: 30.6 % — ABNORMAL LOW (ref 36.0–46.0)
Hemoglobin: 9.9 g/dL — ABNORMAL LOW (ref 12.0–15.0)

## 2019-04-13 LAB — APTT
aPTT: 29 seconds (ref 24–36)
aPTT: 34 seconds (ref 24–36)

## 2019-04-13 LAB — PROTIME-INR
INR: 1.5 — ABNORMAL HIGH (ref 0.8–1.2)
Prothrombin Time: 18 seconds — ABNORMAL HIGH (ref 11.4–15.2)

## 2019-04-13 LAB — PREPARE RBC (CROSSMATCH)

## 2019-04-13 LAB — ECHO INTRAOPERATIVE TEE
Height: 68 in
Weight: 3395.2 oz

## 2019-04-13 LAB — PLATELET COUNT: Platelets: 79 10*3/uL — ABNORMAL LOW (ref 150–400)

## 2019-04-13 SURGERY — REPAIR, MITRAL VALVE
Anesthesia: General | Site: Chest

## 2019-04-13 MED ORDER — LACTATED RINGERS IV SOLN: INTRAVENOUS | Status: DC | PRN

## 2019-04-13 MED ORDER — ONDANSETRON HCL 4 MG/2ML IJ SOLN
INTRAMUSCULAR | Status: AC
  Filled 2019-04-13: qty 2

## 2019-04-13 MED ORDER — ALBUMIN HUMAN 5 % IV SOLN
250.0000 mL | INTRAVENOUS | Status: AC | PRN
  Administered 2019-04-13 (×4): 12.5 g via INTRAVENOUS

## 2019-04-13 MED ORDER — PROPOFOL 10 MG/ML IV BOLUS
INTRAVENOUS | Status: AC
  Filled 2019-04-13: qty 20

## 2019-04-13 MED ORDER — METOPROLOL TARTRATE 5 MG/5ML IV SOLN: 2.5000 mg | INTRAVENOUS | Status: DC | PRN

## 2019-04-13 MED ORDER — VANCOMYCIN HCL 1000 MG IV SOLR
INTRAVENOUS | Status: DC | PRN
  Administered 2019-04-13: 1000 mL

## 2019-04-13 MED ORDER — CHLORHEXIDINE GLUCONATE CLOTH 2 % EX PADS
6.0000 | MEDICATED_PAD | Freq: Every day | CUTANEOUS | Status: DC
  Administered 2019-04-13 – 2019-04-17 (×5): 6 via TOPICAL

## 2019-04-13 MED ORDER — OXYCODONE HCL 5 MG PO TABS: 5.0000 mg | ORAL_TABLET | ORAL | Status: DC | PRN

## 2019-04-13 MED ORDER — TRANEXAMIC ACID 1000 MG/10ML IV SOLN
INTRAVENOUS | Status: DC | PRN
  Administered 2019-04-13: 1.5 mg/kg/h via INTRAVENOUS

## 2019-04-13 MED ORDER — ROCURONIUM BROMIDE 10 MG/ML (PF) SYRINGE
PREFILLED_SYRINGE | INTRAVENOUS | Status: AC
  Filled 2019-04-13: qty 10

## 2019-04-13 MED ORDER — ONDANSETRON HCL 4 MG/2ML IJ SOLN
INTRAMUSCULAR | Status: DC | PRN
  Administered 2019-04-13: 4 mg via INTRAVENOUS

## 2019-04-13 MED ORDER — DEXAMETHASONE SODIUM PHOSPHATE 10 MG/ML IJ SOLN
INTRAMUSCULAR | Status: DC | PRN
  Administered 2019-04-13: 5 mg via INTRAVENOUS

## 2019-04-13 MED ORDER — INSULIN REGULAR(HUMAN) IN NACL 100-0.9 UT/100ML-% IV SOLN
INTRAVENOUS | Status: DC | PRN
  Administered 2019-04-13: 1.5 [IU]/h via INTRAVENOUS

## 2019-04-13 MED ORDER — PROTAMINE SULFATE 10 MG/ML IV SOLN
INTRAVENOUS | Status: AC
  Filled 2019-04-13: qty 5

## 2019-04-13 MED ORDER — ONDANSETRON HCL 4 MG/2ML IJ SOLN
4.0000 mg | Freq: Four times a day (QID) | INTRAMUSCULAR | Status: DC | PRN
  Administered 2019-04-18 – 2019-04-19 (×3): 4 mg via INTRAVENOUS
  Filled 2019-04-13 (×3): qty 2

## 2019-04-13 MED ORDER — ACETAMINOPHEN 160 MG/5ML PO SOLN
1000.0000 mg | Freq: Four times a day (QID) | ORAL | Status: AC
  Administered 2019-04-14 – 2019-04-18 (×13): 1000 mg
  Filled 2019-04-13 (×13): qty 40.6

## 2019-04-13 MED ORDER — DEXAMETHASONE SODIUM PHOSPHATE 10 MG/ML IJ SOLN
INTRAMUSCULAR | Status: AC
  Filled 2019-04-13: qty 1

## 2019-04-13 MED ORDER — DIPHENHYDRAMINE HCL 50 MG/ML IJ SOLN
INTRAMUSCULAR | Status: DC | PRN
  Administered 2019-04-13: 6.25 mg via INTRAVENOUS

## 2019-04-13 MED ORDER — PANTOPRAZOLE SODIUM 40 MG PO TBEC: 40.0000 mg | DELAYED_RELEASE_TABLET | Freq: Every day | ORAL | Status: DC

## 2019-04-13 MED ORDER — LACTATED RINGERS IV SOLN
INTRAVENOUS | Status: DC
  Administered 2019-04-14: 20 mL/h via INTRAVENOUS

## 2019-04-13 MED ORDER — DOCUSATE SODIUM 100 MG PO CAPS
200.0000 mg | ORAL_CAPSULE | Freq: Every day | ORAL | Status: DC
  Administered 2019-04-14: 200 mg via ORAL
  Filled 2019-04-13: qty 2

## 2019-04-13 MED ORDER — ROCURONIUM BROMIDE 10 MG/ML (PF) SYRINGE
PREFILLED_SYRINGE | INTRAVENOUS | Status: DC | PRN
  Administered 2019-04-13: 50 mg via INTRAVENOUS
  Administered 2019-04-13: 40 mg via INTRAVENOUS
  Administered 2019-04-13: 60 mg via INTRAVENOUS

## 2019-04-13 MED ORDER — MIDAZOLAM HCL (PF) 10 MG/2ML IJ SOLN
INTRAMUSCULAR | Status: AC
  Filled 2019-04-13: qty 2

## 2019-04-13 MED ORDER — PHENYLEPHRINE 40 MCG/ML (10ML) SYRINGE FOR IV PUSH (FOR BLOOD PRESSURE SUPPORT)
PREFILLED_SYRINGE | INTRAVENOUS | Status: DC | PRN
  Administered 2019-04-13: 40 ug via INTRAVENOUS
  Administered 2019-04-13: 80 ug via INTRAVENOUS

## 2019-04-13 MED ORDER — ALBUMIN HUMAN 5 % IV SOLN: INTRAVENOUS | Status: DC | PRN

## 2019-04-13 MED ORDER — MOMETASONE FURO-FORMOTEROL FUM 200-5 MCG/ACT IN AERO
2.0000 | INHALATION_SPRAY | Freq: Two times a day (BID) | RESPIRATORY_TRACT | Status: DC
  Filled 2019-04-13: qty 8.8

## 2019-04-13 MED ORDER — ACETAMINOPHEN 500 MG PO TABS
1000.0000 mg | ORAL_TABLET | Freq: Four times a day (QID) | ORAL | Status: AC
  Administered 2019-04-14 – 2019-04-18 (×2): 1000 mg via ORAL
  Filled 2019-04-13 (×2): qty 2

## 2019-04-13 MED ORDER — ETOMIDATE 2 MG/ML IV SOLN: INTRAVENOUS | Status: DC | PRN

## 2019-04-13 MED ORDER — SODIUM CHLORIDE 0.9 % IV SOLN: INTRAVENOUS | Status: DC

## 2019-04-13 MED ORDER — HEPARIN SODIUM (PORCINE) 1000 UNIT/ML IJ SOLN
INTRAMUSCULAR | Status: AC
  Filled 2019-04-13: qty 1

## 2019-04-13 MED ORDER — ALBUMIN HUMAN 5 % IV SOLN
250.0000 mL | INTRAVENOUS | Status: AC | PRN
  Administered 2019-04-13: 12.5 g via INTRAVENOUS

## 2019-04-13 MED ORDER — EPHEDRINE 5 MG/ML INJ
INTRAVENOUS | Status: AC
  Filled 2019-04-13: qty 10

## 2019-04-13 MED ORDER — PHENYLEPHRINE HCL-NACL 10-0.9 MG/250ML-% IV SOLN: INTRAVENOUS | Status: DC | PRN

## 2019-04-13 MED ORDER — MILRINONE LACTATE IN DEXTROSE 20-5 MG/100ML-% IV SOLN
INTRAVENOUS | Status: DC | PRN
  Administered 2019-04-13: .3 ug/kg/min via INTRAVENOUS

## 2019-04-13 MED ORDER — TRAMADOL HCL 50 MG PO TABS: 50.0000 mg | ORAL_TABLET | ORAL | Status: DC | PRN

## 2019-04-13 MED ORDER — ETOMIDATE 2 MG/ML IV SOLN
INTRAVENOUS | Status: DC | PRN
  Administered 2019-04-13: 10 mg via INTRAVENOUS

## 2019-04-13 MED ORDER — PROTAMINE SULFATE 10 MG/ML IV SOLN
INTRAVENOUS | Status: DC | PRN
  Administered 2019-04-13: 290 mg via INTRAVENOUS

## 2019-04-13 MED ORDER — DEXMEDETOMIDINE HCL IN NACL 400 MCG/100ML IV SOLN
INTRAVENOUS | Status: DC | PRN
  Administered 2019-04-13: .4 ug/kg/h via INTRAVENOUS

## 2019-04-13 MED ORDER — ASPIRIN EC 325 MG PO TBEC
325.0000 mg | DELAYED_RELEASE_TABLET | Freq: Every day | ORAL | Status: DC
  Administered 2019-04-14: 325 mg via ORAL
  Filled 2019-04-13: qty 1

## 2019-04-13 MED ORDER — MAGNESIUM SULFATE 4 GM/100ML IV SOLN
4.0000 g | Freq: Once | INTRAVENOUS | Status: AC
  Administered 2019-04-13: 4 g via INTRAVENOUS

## 2019-04-13 MED ORDER — FENTANYL CITRATE (PF) 250 MCG/5ML IJ SOLN
INTRAMUSCULAR | Status: AC
  Filled 2019-04-13: qty 25

## 2019-04-13 MED ORDER — INSULIN REGULAR(HUMAN) IN NACL 100-0.9 UT/100ML-% IV SOLN
INTRAVENOUS | Status: DC
  Administered 2019-04-13: 1 [IU]/h via INTRAVENOUS

## 2019-04-13 MED ORDER — LEVALBUTEROL HCL 1.25 MG/0.5ML IN NEBU: 1.2500 mg | INHALATION_SOLUTION | Freq: Four times a day (QID) | RESPIRATORY_TRACT | Status: DC

## 2019-04-13 MED ORDER — CALCIUM CHLORIDE 10 % IV SOLN
1.0000 g | Freq: Once | INTRAVENOUS | Status: AC
  Administered 2019-04-13: 1 g via INTRAVENOUS

## 2019-04-13 MED ORDER — HEPARIN SODIUM (PORCINE) 1000 UNIT/ML IJ SOLN
INTRAMUSCULAR | Status: DC | PRN
  Administered 2019-04-13: 30000 [IU] via INTRAVENOUS

## 2019-04-13 MED ORDER — LEVOFLOXACIN IN D5W 750 MG/150ML IV SOLN: 750.0000 mg | INTRAVENOUS | Status: DC

## 2019-04-13 MED ORDER — NITROGLYCERIN IN D5W 200-5 MCG/ML-% IV SOLN
INTRAVENOUS | Status: DC | PRN
  Administered 2019-04-13: 5 ug/min via INTRAVENOUS

## 2019-04-13 MED ORDER — LACTATED RINGERS IV SOLN: 500.0000 mL | Freq: Once | INTRAVENOUS | Status: DC | PRN

## 2019-04-13 MED ORDER — SODIUM CHLORIDE 0.9% FLUSH
3.0000 mL | Freq: Two times a day (BID) | INTRAVENOUS | Status: DC
  Administered 2019-04-14 – 2019-04-22 (×14): 3 mL via INTRAVENOUS

## 2019-04-13 MED ORDER — SODIUM CHLORIDE 0.9 % IV SOLN
20.0000 ug | Freq: Once | INTRAVENOUS | Status: AC
  Administered 2019-04-13: 20 ug via INTRAVENOUS
  Filled 2019-04-13: qty 5

## 2019-04-13 MED ORDER — PHENYLEPHRINE 40 MCG/ML (10ML) SYRINGE FOR IV PUSH (FOR BLOOD PRESSURE SUPPORT)
PREFILLED_SYRINGE | INTRAVENOUS | Status: AC
  Filled 2019-04-13: qty 10

## 2019-04-13 MED ORDER — SODIUM CHLORIDE 0.9% IV SOLUTION
Freq: Once | INTRAVENOUS | Status: AC
  Administered 2019-04-13: 10 mL/h via INTRAVENOUS

## 2019-04-13 MED ORDER — DEXMEDETOMIDINE HCL IN NACL 400 MCG/100ML IV SOLN
0.0000 ug/kg/h | INTRAVENOUS | Status: DC
  Administered 2019-04-13 – 2019-04-14 (×3): 0.7 ug/kg/h via INTRAVENOUS
  Filled 2019-04-13 (×3): qty 100

## 2019-04-13 MED ORDER — PLASMA-LYTE 148 IV SOLN
INTRAVENOUS | Status: DC | PRN
  Administered 2019-04-13: 500 mL

## 2019-04-13 MED ORDER — SODIUM CHLORIDE 0.9 % IV SOLN
1.0000 g | INTRAVENOUS | Status: AC
  Administered 2019-04-13 – 2019-04-15 (×3): 1000 mg via INTRAVENOUS
  Filled 2019-04-13 (×3): qty 1

## 2019-04-13 MED ORDER — DIPHENHYDRAMINE HCL 50 MG/ML IJ SOLN
INTRAMUSCULAR | Status: AC
  Filled 2019-04-13: qty 1

## 2019-04-13 MED ORDER — PHENYLEPHRINE HCL-NACL 20-0.9 MG/250ML-% IV SOLN
0.0000 ug/min | INTRAVENOUS | Status: DC
  Administered 2019-04-13: 45 ug/min via INTRAVENOUS
  Administered 2019-04-13: 60 ug/min via INTRAVENOUS
  Administered 2019-04-14: 25 ug/min via INTRAVENOUS
  Administered 2019-04-14: 35 ug/min via INTRAVENOUS
  Administered 2019-04-15: 70 ug/min via INTRAVENOUS
  Administered 2019-04-15: 40 ug/min via INTRAVENOUS
  Administered 2019-04-16: 70 ug/min via INTRAVENOUS
  Filled 2019-04-13 (×3): qty 250
  Filled 2019-04-13 (×2): qty 500
  Filled 2019-04-13: qty 250

## 2019-04-13 MED ORDER — BISACODYL 5 MG PO TBEC
10.0000 mg | DELAYED_RELEASE_TABLET | Freq: Every day | ORAL | Status: DC
  Administered 2019-04-14: 10 mg via ORAL
  Filled 2019-04-13: qty 2

## 2019-04-13 MED ORDER — SODIUM CHLORIDE 0.9 % IV SOLN: 250.0000 mL | INTRAVENOUS | Status: DC

## 2019-04-13 MED ORDER — LACTATED RINGERS IV SOLN: INTRAVENOUS | Status: DC

## 2019-04-13 MED ORDER — DEXTROSE 50 % IV SOLN: 0.0000 mL | INTRAVENOUS | Status: DC | PRN

## 2019-04-13 MED ORDER — SODIUM CHLORIDE 0.9% FLUSH: 3.0000 mL | INTRAVENOUS | Status: DC | PRN

## 2019-04-13 MED ORDER — POTASSIUM CHLORIDE 10 MEQ/50ML IV SOLN
10.0000 meq | Freq: Once | INTRAVENOUS | Status: AC
  Administered 2019-04-13: 10 meq via INTRAVENOUS

## 2019-04-13 MED ORDER — SODIUM CHLORIDE 0.9% IV SOLUTION: Freq: Once | INTRAVENOUS | Status: DC

## 2019-04-13 MED ORDER — ALBUTEROL SULFATE HFA 108 (90 BASE) MCG/ACT IN AERS
INHALATION_SPRAY | RESPIRATORY_TRACT | Status: AC
  Filled 2019-04-13: qty 6.7

## 2019-04-13 MED ORDER — SODIUM CHLORIDE 0.45 % IV SOLN: INTRAVENOUS | Status: DC | PRN

## 2019-04-13 MED ORDER — ETOMIDATE 2 MG/ML IV SOLN
INTRAVENOUS | Status: AC
  Filled 2019-04-13: qty 10

## 2019-04-13 MED ORDER — SODIUM CHLORIDE 0.9 % IV SOLN
INTRAVENOUS | Status: AC
  Administered 2019-04-13: 100 mL/h via INTRAVENOUS

## 2019-04-13 MED ORDER — ACETAMINOPHEN 160 MG/5ML PO SOLN
650.0000 mg | Freq: Once | ORAL | Status: AC
  Administered 2019-04-13: 650 mg

## 2019-04-13 MED ORDER — BISACODYL 10 MG RE SUPP
10.0000 mg | Freq: Every day | RECTAL | Status: DC
  Administered 2019-04-15 – 2019-04-19 (×4): 10 mg via RECTAL
  Filled 2019-04-13 (×4): qty 1

## 2019-04-13 MED ORDER — 0.9 % SODIUM CHLORIDE (POUR BTL) OPTIME
TOPICAL | Status: DC | PRN
  Administered 2019-04-13: 5000 mL

## 2019-04-13 MED ORDER — ORAL CARE MOUTH RINSE
15.0000 mL | OROMUCOSAL | Status: DC
  Administered 2019-04-14 – 2019-04-18 (×45): 15 mL via OROMUCOSAL

## 2019-04-13 MED ORDER — NITROGLYCERIN IN D5W 200-5 MCG/ML-% IV SOLN: 0.0000 ug/min | INTRAVENOUS | Status: DC

## 2019-04-13 MED ORDER — SODIUM CHLORIDE 0.9 % IR SOLN
Status: DC | PRN
  Administered 2019-04-13: 1000 mL
  Administered 2019-04-13: 3000 mL

## 2019-04-13 MED ORDER — MORPHINE SULFATE (PF) 2 MG/ML IV SOLN
1.0000 mg | INTRAVENOUS | Status: DC | PRN
  Administered 2019-04-13 – 2019-04-19 (×7): 2 mg via INTRAVENOUS
  Filled 2019-04-13 (×7): qty 1

## 2019-04-13 MED ORDER — MIDAZOLAM HCL 5 MG/5ML IJ SOLN
INTRAMUSCULAR | Status: DC | PRN
  Administered 2019-04-13 (×2): 2 mg via INTRAVENOUS
  Administered 2019-04-13: 1 mg via INTRAVENOUS
  Administered 2019-04-13: 2 mg via INTRAVENOUS
  Administered 2019-04-13: 3 mg via INTRAVENOUS

## 2019-04-13 MED ORDER — ASPIRIN 81 MG PO CHEW
324.0000 mg | CHEWABLE_TABLET | Freq: Every day | ORAL | Status: DC
  Administered 2019-04-15: 324 mg
  Filled 2019-04-13: qty 4

## 2019-04-13 MED ORDER — SODIUM CHLORIDE (PF) 0.9 % IJ SOLN
INTRAMUSCULAR | Status: AC
  Filled 2019-04-13: qty 10

## 2019-04-13 MED ORDER — FENTANYL CITRATE (PF) 100 MCG/2ML IJ SOLN
INTRAMUSCULAR | Status: DC | PRN
  Administered 2019-04-13: 50 ug via INTRAVENOUS
  Administered 2019-04-13: 150 ug via INTRAVENOUS
  Administered 2019-04-13: 100 ug via INTRAVENOUS
  Administered 2019-04-13: 150 ug via INTRAVENOUS
  Administered 2019-04-13: 250 ug via INTRAVENOUS
  Administered 2019-04-13: 100 ug via INTRAVENOUS
  Administered 2019-04-13: 150 ug via INTRAVENOUS
  Administered 2019-04-13: 100 ug via INTRAVENOUS
  Administered 2019-04-13: 200 ug via INTRAVENOUS

## 2019-04-13 MED ORDER — ALBUMIN HUMAN 5 % IV SOLN
12.5000 g | Freq: Once | INTRAVENOUS | Status: AC
  Administered 2019-04-13: 12.5 g via INTRAVENOUS

## 2019-04-13 MED ORDER — MIDAZOLAM HCL 2 MG/2ML IJ SOLN
2.0000 mg | INTRAMUSCULAR | Status: DC | PRN
  Administered 2019-04-14 – 2019-04-17 (×8): 2 mg via INTRAVENOUS
  Filled 2019-04-13 (×9): qty 2

## 2019-04-13 MED ORDER — HEMOSTATIC AGENTS (NO CHARGE) OPTIME
TOPICAL | Status: DC | PRN
  Administered 2019-04-13 (×7): 1 via TOPICAL

## 2019-04-13 MED ORDER — EPINEPHRINE HCL 5 MG/250ML IV SOLN IN NS
0.5000 ug/min | INTRAVENOUS | Status: DC
  Administered 2019-04-13: 2 ug/min via INTRAVENOUS

## 2019-04-13 MED ORDER — CHLORHEXIDINE GLUCONATE 0.12 % MT SOLN
15.0000 mL | OROMUCOSAL | Status: AC
  Administered 2019-04-13: 15 mL via OROMUCOSAL

## 2019-04-13 MED ORDER — VANCOMYCIN HCL IN DEXTROSE 1-5 GM/200ML-% IV SOLN
1000.0000 mg | Freq: Once | INTRAVENOUS | Status: AC
  Administered 2019-04-14: 1000 mg via INTRAVENOUS

## 2019-04-13 MED ORDER — POTASSIUM CHLORIDE 10 MEQ/50ML IV SOLN
10.0000 meq | INTRAVENOUS | Status: AC
  Administered 2019-04-13 (×2): 10 meq via INTRAVENOUS

## 2019-04-13 MED ORDER — ACETAMINOPHEN 650 MG RE SUPP: 650.0000 mg | Freq: Once | RECTAL | Status: AC

## 2019-04-13 MED ORDER — FAMOTIDINE IN NACL 20-0.9 MG/50ML-% IV SOLN
20.0000 mg | Freq: Two times a day (BID) | INTRAVENOUS | Status: AC
  Administered 2019-04-13 (×2): 20 mg via INTRAVENOUS
  Filled 2019-04-13: qty 50

## 2019-04-13 MED ORDER — LEVALBUTEROL HCL 1.25 MG/0.5ML IN NEBU
1.2500 mg | INHALATION_SOLUTION | Freq: Four times a day (QID) | RESPIRATORY_TRACT | Status: DC
  Administered 2019-04-13 – 2019-04-16 (×11): 1.25 mg via RESPIRATORY_TRACT
  Filled 2019-04-13 (×11): qty 0.5

## 2019-04-13 MED ORDER — MILRINONE LACTATE IN DEXTROSE 20-5 MG/100ML-% IV SOLN
0.3000 ug/kg/min | INTRAVENOUS | Status: DC
  Administered 2019-04-13 – 2019-04-14 (×3): 0.3 ug/kg/min via INTRAVENOUS
  Administered 2019-04-14: 0.25 ug/kg/min via INTRAVENOUS
  Administered 2019-04-15 – 2019-04-16 (×3): 0.3 ug/kg/min via INTRAVENOUS
  Filled 2019-04-13 (×7): qty 100

## 2019-04-13 MED ORDER — CHLORHEXIDINE GLUCONATE 0.12% ORAL RINSE (MEDLINE KIT)
15.0000 mL | Freq: Two times a day (BID) | OROMUCOSAL | Status: DC
  Administered 2019-04-13 – 2019-04-18 (×9): 15 mL via OROMUCOSAL

## 2019-04-13 SURGICAL SUPPLY — 131 items
ADAPTER CARDIO PERF ANTE/RETRO (ADAPTER) ×3 IMPLANT
APPLICATOR COTTON TIP 6 STRL (MISCELLANEOUS) IMPLANT
APPLICATOR COTTON TIP 6IN STRL (MISCELLANEOUS)
ARTICLIP LAA PROCLIP II 45 (Clip) ×3 IMPLANT
BAG DECANTER FOR FLEXI CONT (MISCELLANEOUS) ×6 IMPLANT
BLADE CLIPPER SURG (BLADE) ×3 IMPLANT
BLADE STERNUM SYSTEM 6 (BLADE) ×3 IMPLANT
BLADE SURG 11 STRL SS (BLADE) ×3 IMPLANT
CANISTER SUCT 3000ML PPV (MISCELLANEOUS) ×3 IMPLANT
CANN PRFSN 3/8X14X24FR PCFC (MISCELLANEOUS)
CANN PRFSN 3/8XCNCT ST RT ANG (MISCELLANEOUS)
CANNULA AORTIC ROOT 9FR (CANNULA) ×3 IMPLANT
CANNULA EZ GLIDE AORTIC 21FR (CANNULA) ×3 IMPLANT
CANNULA FEM VENOUS REMOTE 22FR (CANNULA) ×3 IMPLANT
CANNULA GUNDRY RCSP 15FR (MISCELLANEOUS) ×3 IMPLANT
CANNULA PRFSN 3/8X14X24FR PCFC (MISCELLANEOUS) IMPLANT
CANNULA PRFSN 3/8XCNCT RT ANG (MISCELLANEOUS) IMPLANT
CANNULA SUMP PERICARDIAL (CANNULA) ×3 IMPLANT
CANNULA VEN MTL TIP RT (MISCELLANEOUS)
CANNULA VENNOUS METAL TIP 20FR (CANNULA) ×3 IMPLANT
CATH FOLEY 2WAY SLVR  5CC 14FR (CATHETERS)
CATH FOLEY 2WAY SLVR 5CC 14FR (CATHETERS) IMPLANT
CATH THORACIC 28FR RT ANG (CATHETERS) IMPLANT
CATH THORACIC 36FR (CATHETERS) ×3 IMPLANT
CLIP FOGARTY SPRING 6M (CLIP) IMPLANT
CONN 1/2X1/2X1/2  BEN (MISCELLANEOUS) ×3
CONN 1/2X1/2X1/2 BEN (MISCELLANEOUS) ×2 IMPLANT
CONN 3/8X1/2 ST GISH (MISCELLANEOUS) ×6 IMPLANT
CONN ST 1/4X3/8  BEN (MISCELLANEOUS) ×3
CONN ST 1/4X3/8 BEN (MISCELLANEOUS) ×2 IMPLANT
CONT SPEC 4OZ CLIKSEAL STRL BL (MISCELLANEOUS) ×6 IMPLANT
COVER PROBE W GEL 5X96 (DRAPES) ×3 IMPLANT
COVER SURGICAL LIGHT HANDLE (MISCELLANEOUS) IMPLANT
DERMABOND ADVANCED (GAUZE/BANDAGES/DRESSINGS) ×2
DERMABOND ADVANCED .7 DNX12 (GAUZE/BANDAGES/DRESSINGS) ×4 IMPLANT
DEVICE ATRICLIP LAA PRCLPII 45 (Clip) ×2 IMPLANT
DEVICE CLOSURE PERCLS PRGLD 6F (VASCULAR PRODUCTS) ×4 IMPLANT
DEVICE SUT CK QUICK LOAD MINI (Prosthesis & Implant Heart) ×6 IMPLANT
DRAIN CHANNEL 32F RND 10.7 FF (WOUND CARE) ×9 IMPLANT
DRAPE CARDIOVASC SPLIT 88X140 (DRAPES) IMPLANT
DRAPE CARDIOVASCULAR INCISE (DRAPES) ×3
DRAPE CV SPLIT W-CLR ANES SCRN (DRAPES) IMPLANT
DRAPE INCISE IOBAN 66X45 STRL (DRAPES) ×6 IMPLANT
DRAPE PERI GROIN 82X75IN TIB (DRAPES) ×3 IMPLANT
DRAPE SLUSH/WARMER DISC (DRAPES) ×3 IMPLANT
DRAPE SRG 135X102X78XABS (DRAPES) ×2 IMPLANT
DRSG AQUACEL AG ADV 3.5X14 (GAUZE/BANDAGES/DRESSINGS) ×3 IMPLANT
ELECT REM PT RETURN 9FT ADLT (ELECTROSURGICAL) ×6
ELECTRODE REM PT RTRN 9FT ADLT (ELECTROSURGICAL) ×4 IMPLANT
FELT TEFLON 1X6 (MISCELLANEOUS) ×3 IMPLANT
GAUZE SPONGE 4X4 12PLY STRL (GAUZE/BANDAGES/DRESSINGS) ×3 IMPLANT
GAUZE SPONGE 4X4 12PLY STRL LF (GAUZE/BANDAGES/DRESSINGS) ×3 IMPLANT
GLOVE BIO SURGEON STRL SZ 6 (GLOVE) IMPLANT
GLOVE BIO SURGEON STRL SZ 6.5 (GLOVE) ×18 IMPLANT
GLOVE BIO SURGEON STRL SZ7 (GLOVE) IMPLANT
GLOVE BIO SURGEON STRL SZ7.5 (GLOVE) IMPLANT
GLOVE BIOGEL PI IND STRL 6.5 (GLOVE) ×6 IMPLANT
GLOVE BIOGEL PI IND STRL 7.5 (GLOVE) ×4 IMPLANT
GLOVE BIOGEL PI INDICATOR 6.5 (GLOVE) ×3
GLOVE BIOGEL PI INDICATOR 7.5 (GLOVE) ×2
GLOVE ORTHO TXT STRL SZ7.5 (GLOVE) ×9 IMPLANT
GOWN STRL REUS W/ TWL LRG LVL3 (GOWN DISPOSABLE) ×20 IMPLANT
GOWN STRL REUS W/TWL LRG LVL3 (GOWN DISPOSABLE) ×30
HEMOSTAT POWDER SURGIFOAM 1G (HEMOSTASIS) ×21 IMPLANT
INSERT FOGARTY XLG (MISCELLANEOUS) ×3 IMPLANT
KIT BASIN OR (CUSTOM PROCEDURE TRAY) ×3 IMPLANT
KIT DILATOR VASC 18G NDL (KITS) ×3 IMPLANT
KIT SUCTION CATH 14FR (SUCTIONS) ×15 IMPLANT
KIT SUT CK MINI COMBO 4X17 (Prosthesis & Implant Heart) ×3 IMPLANT
KIT TURNOVER KIT B (KITS) ×3 IMPLANT
LEAD PACING MYOCARDI (MISCELLANEOUS) ×3 IMPLANT
LINE VENT (MISCELLANEOUS) ×3 IMPLANT
LOOP VESSEL SUPERMAXI WHITE (MISCELLANEOUS) ×3 IMPLANT
MARKER GRAFT CORONARY BYPASS (MISCELLANEOUS) IMPLANT
NDL SUT 1 .5 CRC FRENCH EYE (NEEDLE) ×6 IMPLANT
NEEDLE FRENCH EYE (NEEDLE) ×9
NS IRRIG 1000ML POUR BTL (IV SOLUTION) ×15 IMPLANT
PACK OPEN HEART (CUSTOM PROCEDURE TRAY) ×3 IMPLANT
PAD ARMBOARD 7.5X6 YLW CONV (MISCELLANEOUS) ×6 IMPLANT
PERCLOSE PROGLIDE 6F (VASCULAR PRODUCTS) ×6
POSITIONER HEAD DONUT 9IN (MISCELLANEOUS) ×3 IMPLANT
RING MITRAL MEMO 4D 34 (Prosthesis & Implant Heart) ×3 IMPLANT
RING MITRAL MEMO 4D 36 (Prosthesis & Implant Heart) ×3 IMPLANT
RING MITRAL MEMO 4D 38 (Prosthesis & Implant Heart) ×3 IMPLANT
SET CARDIOPLEGIA MPS 5001102 (MISCELLANEOUS) ×3 IMPLANT
SET IRRIG TUBING LAPAROSCOPIC (IRRIGATION / IRRIGATOR) ×3 IMPLANT
SHEATH PINNACLE 8F 10CM (SHEATH) ×3 IMPLANT
SIZER CHORD-X CHORDAL CXCS (SIZER) ×3 IMPLANT
SOL ANTI FOG 6CC (MISCELLANEOUS) ×2 IMPLANT
SOLUTION ANTI FOG 6CC (MISCELLANEOUS) ×1
SPONGE LAP 4X18 RFD (DISPOSABLE) ×3 IMPLANT
SUT BONE WAX W31G (SUTURE) ×3 IMPLANT
SUT ETHIBOND (SUTURE) ×6 IMPLANT
SUT ETHIBOND 2 0 SH (SUTURE) ×9 IMPLANT
SUT ETHIBOND 2 0 SH 36X2 (SUTURE) ×4 IMPLANT
SUT ETHIBOND 2 0 V4 (SUTURE) IMPLANT
SUT ETHIBOND 2 0V4 GREEN (SUTURE) IMPLANT
SUT ETHIBOND 2-0 RB-1 WHT (SUTURE) ×6 IMPLANT
SUT ETHIBOND 4 0 RB 1 (SUTURE) IMPLANT
SUT ETHIBOND 4 0 TF (SUTURE) IMPLANT
SUT ETHIBOND 5 0 C 1 30 (SUTURE) ×3 IMPLANT
SUT ETHIBOND X763 2 0 SH 1 (SUTURE) ×9 IMPLANT
SUT GORETEX CV4 TH-18 (SUTURE) ×33 IMPLANT
SUT MNCRL AB 3-0 PS2 18 (SUTURE) ×6 IMPLANT
SUT PDS AB 1 CTX 36 (SUTURE) ×6 IMPLANT
SUT PROLENE 2 0 SH DA (SUTURE) ×6 IMPLANT
SUT PROLENE 3 0 SH 1 (SUTURE) ×3 IMPLANT
SUT PROLENE 3 0 SH DA (SUTURE) ×6 IMPLANT
SUT PROLENE 3 0 SH1 36 (SUTURE) ×6 IMPLANT
SUT PROLENE 4 0 RB 1 (SUTURE) ×21
SUT PROLENE 4 0 SH DA (SUTURE) ×6 IMPLANT
SUT PROLENE 4-0 RB1 .5 CRCL 36 (SUTURE) ×14 IMPLANT
SUT PROLENE 6 0 C 1 30 (SUTURE) ×3 IMPLANT
SUT PTFE CHORD X 16MM (SUTURE) ×3 IMPLANT
SUT SILK  1 MH (SUTURE) ×9
SUT SILK 1 MH (SUTURE) ×6 IMPLANT
SUT STEEL 6MS V (SUTURE) IMPLANT
SUT STEEL STERNAL CCS#1 18IN (SUTURE) IMPLANT
SUT STEEL SZ 6 DBL 3X14 BALL (SUTURE) IMPLANT
SUT VIC AB 2-0 CTX 27 (SUTURE) IMPLANT
SYSTEM SAHARA CHEST DRAIN ATS (WOUND CARE) ×6 IMPLANT
TAPE CLOTH SURG 4X10 WHT LF (GAUZE/BANDAGES/DRESSINGS) ×3 IMPLANT
TAPE PAPER 2X10 WHT MICROPORE (GAUZE/BANDAGES/DRESSINGS) ×3 IMPLANT
TOWEL GREEN STERILE (TOWEL DISPOSABLE) ×3 IMPLANT
TOWEL GREEN STERILE FF (TOWEL DISPOSABLE) IMPLANT
TRAY FOLEY SLVR 16FR TEMP STAT (SET/KITS/TRAYS/PACK) ×3 IMPLANT
TUBE SUCT INTRACARD DLP 20F (MISCELLANEOUS) ×3 IMPLANT
TUBING LAP HI FLOW INSUFFLATIO (TUBING) IMPLANT
UNDERPAD 30X30 (UNDERPADS AND DIAPERS) ×3 IMPLANT
WATER STERILE IRR 1000ML POUR (IV SOLUTION) ×6 IMPLANT
WIRE EMERALD 3MM-J .035X150CM (WIRE) ×3 IMPLANT

## 2019-04-13 NOTE — OR Nursing (Signed)
The Arthrex TigerTape Sternal Closure: Ref: [id number] / Lot: [id number] times four; FiberTape Sternal Closure: Ref: [id number] / Lot: [id number] times four; FiberTape Cerclage Passing Needle, Medium with #2-0 Shuttling Suture (Double Loop) times one total of nine needles were used by Dr. Valentina Gu. Roxy Manns, MD. This will be considered the surgeon's single trial of sternal closing system.

## 2019-04-13 NOTE — Anesthesia Procedure Notes (Signed)
Central Venous Catheter Insertion Performed by: Albertha Ghee, MD, anesthesiologist Start/End3/06/2019 6:55 AM, 04/13/2019 7:03 AM Patient location: Pre-op. Preanesthetic checklist: patient identified, IV checked, site marked, risks and benefits discussed, surgical consent, monitors and equipment checked, pre-op evaluation, timeout performed and anesthesia consent Position: Trendelenburg Lidocaine 1% used for infiltration and patient sedated Hand hygiene performed , maximum sterile barriers used  and Seldinger technique used Catheter size: 9 Fr Central line and PA cath was placed.MAC introducer Swan type:thermodilation Procedure performed using ultrasound guided technique. Ultrasound Notes:anatomy identified, needle tip was noted to be adjacent to the nerve/plexus identified, no ultrasound evidence of intravascular and/or intraneural injection and image(s) printed for medical record Attempts: 1 Following insertion, line sutured, dressing applied and Biopatch. Post procedure assessment: blood return through all ports, free fluid flow and no air  Patient tolerated the procedure well with no immediate complications.

## 2019-04-13 NOTE — Progress Notes (Signed)
      [address]       [place] [address]             [phone]     CARDIOTHORACIC SURGERY PROGRESS NOTE  Subjective: Rebecca Carr has been scheduled for Procedure(s): MITRAL VALVE REPAIR (MVR) (N/A) TRANSESOPHAGEAL ECHOCARDIOGRAM (TEE) (N/A) today.   Objective: Vital signs in last 24 hours: Temp:  [97.8 F (36.6 C)-98.3 F (36.8 C)] 98 F (36.7 C) (03/05 0425) Pulse Rate:  [75-79] 75 (03/05 0425) Cardiac Rhythm: Normal sinus rhythm (03/04 1900) Resp:  [20] 20 (03/05 0425) BP: (100-124)/(50-71) 112/69 (03/05 0425) SpO2:  [90 %-100 %] 92 % (03/05 0425) Weight:  [96.3 kg] 96.3 kg (03/05 0425)  Physical Exam: Unchanged from previously   Intake/Output from previous day: 03/04 0701 - 03/05 0700 In: 360 [P.O.:360] Out: 4200 [Urine:4200] Intake/Output this shift: No intake/output data recorded.  Lab Results: Recent Labs    04/11/19 0515 04/13/19 0500  WBC 14.7* 11.7*  HGB 12.2 14.1  HCT 40.3 45.6  PLT 201 218   BMET:  Recent Labs    04/12/19 0326 04/13/19 0500  NA 139 139  K 3.9 4.2  CL 93* 86*  CO2 37* 41*  GLUCOSE 104* 103*  BUN 23 25*  CREATININE 1.28* 1.44*  CALCIUM 9.1 9.8    CBG (last 3)  No results for input(s): GLUCAP in the last 72 hours. PT/INR:  No results for input(s): LABPROT, INR in the last 72 hours.  Assessment/Plan:   The various methods of treatment have been discussed with the patient. After consideration of the risks, benefits and treatment options the patient has consented to the planned procedure.   The patient has been seen and labs reviewed. There are no changes in the patient's condition to prevent proceeding with the planned procedure today.   Rexene Alberts, MD 04/13/2019 7:32 AM

## 2019-04-13 NOTE — Anesthesia Procedure Notes (Signed)
Procedure Name: Intubation Date/Time: 04/13/2019 9:00 AM Performed by: Kathryne Hitch, CRNA Pre-anesthesia Checklist: Patient identified, Emergency Drugs available, Suction available and Patient being monitored Patient Re-evaluated:Patient Re-evaluated prior to induction Oxygen Delivery Method: Circle system utilized Preoxygenation: Pre-oxygenation with 100% oxygen Induction Type: IV induction Ventilation: Oral airway inserted - appropriate to patient size and Two handed mask ventilation required Laryngoscope Size: Glidescope and 4 Grade View: Grade I Tube type: Oral Tube size: 8.0 mm Number of attempts: 1 Airway Equipment and Method: Stylet and Oral airway Placement Confirmation: ETT inserted through vocal cords under direct vision,  positive ETCO2 and breath sounds checked- equal and bilateral Secured at: 23 cm Tube secured with: Tape Dental Injury: Teeth and Oropharynx as per pre-operative assessment

## 2019-04-13 NOTE — Progress Notes (Signed)
CT surgery p.m. Rounds  Patient remains sedated under anesthesia after mitral valve repair for severe MR History of COPD with compensated hypercarbia preoperatively Hemodynamically stable with minimal chest tube output We will place on bronchodilator therapy and follow ABGs carefully

## 2019-04-13 NOTE — Brief Op Note (Signed)
04/10/2019 - 04/13/2019  12:42 PM  PATIENT:  Rebecca Carr  69 y.o. female  PRE-OPERATIVE DIAGNOSIS:  SEVERE MITRAL REGURGITATION MITRAL VALVE PROLAPSE  POST-OPERATIVE DIAGNOSIS:  SEVERE MITRAL REGURGITATION MITRAL VALVE PROLAPSE  PROCEDURE:  Procedure(s): MITRAL VALVE REPAIR (MVR) (N/A) TRANSESOPHAGEAL ECHOCARDIOGRAM (TEE) (N/A)  SURGEON:  Surgeon(s) and Role:    Rexene Alberts, MD - Primary  PHYSICIAN ASSISTANT: WAYNE GOLD PA-C  ANESTHESIA:   general  EBL:  975 mL  BLOOD ADMINISTERED:none  DRAINS: MEDIASTINAL CHEST TUBES   LOCAL MEDICATIONS USED:  NONE  SPECIMEN:  Source of Specimen:  PORTION POSTERIOR MITRAL VALVE LEAFLET  DISPOSITION OF SPECIMEN:  PATHOLOGY  COUNTS:  YES  TOURNIQUET:  * No tourniquets in log *  DICTATION: .Dragon Dictation  PLAN OF CARE: Admit to inpatient   PATIENT DISPOSITION:  ICU - intubated and hemodynamically stable.   Delay start of Pharmacological VTE agent (>24hrs) due to surgical blood loss or risk of bleeding: yes

## 2019-04-13 NOTE — Transfer of Care (Signed)
Immediate Anesthesia Transfer of Care Note  Patient: Rebecca Carr  Procedure(s) Performed: MITRAL VALVE REPAIR (MVR) using Memo 4D 34 MM Mitral Valve. (N/A Chest) TRANSESOPHAGEAL ECHOCARDIOGRAM (TEE) (N/A ) Clipping Of Atrial Appendage using AtriCure 45 MM AtriClip. (N/A Chest)  Patient Location: SICU  Anesthesia Type:General  Level of Consciousness: Patient remains intubated per anesthesia plan  Airway & Oxygen Therapy: Patient remains intubated per anesthesia plan and Patient placed on Ventilator (see vital sign flow sheet for setting)  Post-op Assessment: Report given to RN and Post -op Vital signs reviewed and stable  Post vital signs: Reviewed and stable  Last Vitals:  Vitals Value Taken Time  BP 130/80   Temp    Pulse 80   Resp    SpO2      Last Pain:  Vitals:   04/13/19 0425  TempSrc: Oral  PainSc:       Patients Stated Pain Goal: 0 ([id number])  Complications: No apparent anesthesia complications

## 2019-04-13 NOTE — Op Note (Addendum)
CARDIOTHORACIC SURGERY OPERATIVE NOTE  Date of Procedure:  04/13/2019  Preoperative Diagnosis: Severe Mitral Regurgitation  Postoperative Diagnosis: Same  Procedure:    Mitral Valve Repair  Complex valvuloplasty including triangular resection of posterior leaflet  Artificial Gore-tex neochord placement x6  Suture plications of posterior leaflets clefts x2  Sorin Memo 4D ring annuloplasty (size 42mm, catalog #[id number], serial [id number])  Clipping of left atrial appendage (Atricure Pro245 left atrial clip, size 45 mm)  Surgeon: Valentina Gu. 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  Multiple ruptured primary chordae tendinae with large flail segment (P2) of posterior leaflet  Type II dysfunction with severe mitral regurgitation  Normal LV systolic function  No residual mitral regurgitation after successful valve repair                       BRIEF CLINICAL NOTE AND INDICATIONS FOR SURGERY  Patient is a 69 year old female with multiple medical problems including a known history of mitral valve prolapse who was admitted to the hospital with acute exacerbation of chronic diastolic congestive heart failure and has been referred for surgical consultation to discuss treatment options for management of mitral valve prolapse with severe symptomatic primary mitral regurgitation.  Patient states that she had "scarlet fever" during childhood.  During early adulthood she was noted to have a murmur on physical exam and nearly 30 years ago she was evaluated at [facility] where she was told she had mitral valve prolapse.  She was also told that eventually she would likely need to have her valve repaired or replaced when she got older.  Patient states that ever since then she has had history of occasional palpitations with atypical chest pain and mild  exertional shortness of breath.    Several years ago she began to experience more episodes of palpitations with brief episodes of the sensation that her heart is pounding and racing at a fast rate.  These episodes always stop within a few seconds but are associated with increased shortness of breath.  She was referred for cardiology consultation and has been evaluated previously by Dr. Ellyn Hack.  Echocardiogram performed in 2018 revealed normal left ventricular systolic function with what was felt to be mild mitral regurgitation.  Over the past 6 months the patient has developed increased frequency of palpitations with worsening exertional shortness of breath.  Follow-up transthoracic echocardiogram performed March 12, 2019 revealed normal left ventricular systolic function with moderately severe and possibly severe mitral regurgitation.  The patient was seen in follow-up by Dr.Harding which time she was experiencing shortness of breath with very mild exertion and occasionally at rest.  She was brought in for elective transesophageal echocardiogram and diagnostic cardiac catheterization on April 10, 2019.  TEE reveals normal left ventricular systolic function, mitral valve prolapse, and mitral regurgitation.  Official report for the TEE remains pending at this time.  Catheterization was notable for the absence of significant coronary artery disease but revealed moderate to severe pulmonary hypertension and large V waves on wedge tracing consistent with severe mitral regurgitation.  The patient was admitted to the hospital and seen in consultation by Dr. Haroldine Laws from the advanced heart failure team.  Intravenous diuretic therapy has been initiated and the patient has responded remarkably well.  Cardiothoracic surgical consultation was requested.  The patient has been seen in consultation and counseled at length regarding the indications, risks  and potential benefits of surgery.  All questions have been  answered, and the patient provides full informed consent for the operation as described.    DETAILS OF THE OPERATIVE PROCEDURE  Preparation:  The patient is brought to the operating room on the above mentioned date and central monitoring was established by the anesthesia team including placement of Swan-Ganz catheter and radial arterial line.  She had severe pulmonary hypertension at baseline.  The patient is placed in the supine position on the operating table.  Intravenous antibiotics are administered. General endotracheal anesthesia is induced uneventfully. A Foley catheter is placed.  Baseline transesophageal echocardiogram was performed.  Findings were notable for severe mitral regurgitation with normal left ventricular systolic function.  There was myxomatous degenerative disease of the mitral valve with a very large flail segment involving the majority of the middle scallop of the posterior leaflet.  There was severe mitral regurgitation with flow reversal in all 4 pulmonary veins.  Right ventricular size and function was normal.  There was trivial tricuspid regurgitation.  The aortic valve appeared normal.  The patient's chest, abdomen, both groins, and both lower extremities are prepared and draped in a sterile manner. A time out procedure is performed.   Percutaneous Vascular Access:  Percutaneous venous access were obtained on the right side.  Using ultrasound guidance the right common femoral vein was cannulated using the Seldinger technique and a pair of Perclose vascular closure devises were placed at opposing 30 degree angles, after which time an 8 French sheath inserted.     Surgical Approach:  A median sternotomy incision was performed and the pericardium is opened. The ascending aorta is normal in appearance.    Extracorporeal Cardiopulmonary Bypass and Myocardial Protection:  The patient was heparinized systemically.  The right common femoral vein is cannulated through  the venous sheath and a guidewire advanced into the right atrium using TEE guidance.  The femoral vein cannulated using a 22 Fr long femoral venous cannula.  The ascending aorta is cannulated for cardiopulmonary bypass.  Adequate heparinization is verified.   A retrograde cardioplegia cannula is placed through the right atrium into the coronary sinus.   The entire pre-bypass portion of the operation was notable for stable hemodynamics.  Cardiopulmonary bypass was begun and the surface of the heart is inspected.  A second venous cannula is placed directly into the superior vena cava.   A cardioplegia cannula is placed in the ascending aorta.  A temperature probe was placed in the interventricular septum.  The operative field is continuously flooded with carbon dioxide.  The patient is cooled to 32C systemic temperature.  The aortic cross clamp is applied and cardioplegia is delivered initially in an antegrade fashion through the aortic root using modified [place] cold blood cardioplegia ([facility] blood cardioplegia protocol).   The initial cardioplegic arrest is rapid with early diastolic arrest.  Repeat doses of cardioplegia are administered at 90 minutes and every 30 minutes thereafter through the coronary sinus catheter in order to maintain completely flat electrocardiogram.  Myocardial protection was felt to be excellent.   Clipping of Left Atrial Appendage:  The left atrial appendage is obliterated by placement of an Atricure left atrial clip (Pro245, size 45 mm) under direct vision.   Mitral Valve Repair:  A left atriotomy incision was performed through the interatrial groove and extended partially across the back wall of the left atrium after opening the oblique sinus inferiorly.  The mitral valve is exposed using a self-retaining retractor.  The mitral valve was inspected and notable for forme fruste variant of Barlow's type myxomatous degenerative disease.  There was a very large flail  segment involving the entire P2 portion of the posterior leaflet with multiple ruptured primary chordae tendinae.  There was calcification at the base of the leaflet with some calcification in secondary chordae tendinae.  There was excessive leaflet tissue involving P2.  There was some billowing of the P1 and P3.  The anterior leaflet was not billowing.    The large flail P2 segment is repaired using a combination of a generous triangular resection and artificial Gore-Tex neochord placement.  Initially a triangular resection is performed.  Approximately 35 to 40% of the surface area of P2 was resected including the redundant somewhat fibrotic middle portion.  The intervening vertical defect in PT was closed using simple everting CV 4 Gore-Tex suture.  Artificial neochord placement was performed using Chord-X multi-strand CV-4 Goretex pre-measured loops.  The appropriate cord length was measured from corresponding normal length primary cords from the P1 segment of the posterior leaflet which measured close to 20 mm.  This length was shortened to 16 mm to further address the excessive prolapse of P2.  The papillary muscle suture of the Chord-X multi-strand suture was placed through the head of the posterior papillary muscle in a horizontal mattress fashion and tied over Teflon felt pledgets. Each of the three pre-measured loops were then reimplanted into the free margin of the P2 segment of the posterior leaflet.   Interrupted 2-0 Ethibond horizontal mattress sutures were placed circumferentially around the entire mitral annulus.  The valve was tested with saline and appeared competent even without ring annuloplasty complete. The valve was sized to a 36 mm annuloplasty ring, based upon the transverse distance between the left and right commissures and the height of the anterior leaflet, corresponding to a size just slightly larger than the overall surface area of the anterior leaflet.  This was upsized and a 38  mm ring is initially chosen.  A Sorin Memo 4D annuloplasty ring (size 58mm) was lowered in place and tested with saline prior to securing the ring sutures.  This ring appeared too large with inadequate surface area of coaptation.  This was removed and a 36 mm Sorin Memo 4D annuloplasty ring was lowered into place, but again the ring appeared oversized.  This ring was removed and the indentations or clefts between P1 and P2 and between P2 and P3 were plicated using 4-0 Prolene sutures to support the posterior leaflet closure.  Finally, a Sorin Memo 4D annuloplasty ring (size 69mm, catalog #[id number], serial [id number]) was secured in place uneventfully.  All sutures were secured using a Cor-knot device.  The valve was tested with saline and appeared competent.   The valve is again tested with saline and appears to be perfectly competent with a broad symmetrical line of coaptation of the anterior and posterior leaflet. There is no residual leak.  Rewarming is begun.   Procedure Completion:  The atriotomy was closed using a 2-layer closure of running 3-0 Prolene suture after placing a sump drain across the mitral valve to serve as a left ventricular vent.  One final dose of warm retrograde "reanimation dose" cardioplegia was administered retrograde through the coronary sinus catheter while all air was evacuated through the aortic root.  The aortic cross clamp was removed after a total cross clamp time of 160 minutes.  Epicardial pacing wires are fixed to the right ventricular outflow tract and to  the right atrial appendage. The patient is rewarmed to 37C temperature. The aortic and left ventricular vents are removed.  The patient is weaned and disconnected from cardiopulmonary bypass.  The patient's rhythm at separation from bypass was sinus.  Atrial pacing was employed.  The patient was weaned from cardiopulmonary bypass on milrinone at 0.3 mcg/kg/min. Total cardiopulmonary bypass time for the operation was 194  minutes.  Followup transesophageal echocardiogram performed after separation from bypass revealed a well-seated mitral annuloplasty ring and a mitral valve that was functioning normally and without any residual mitral regurgitation.  Left ventricular function was mildly reduced in comparison with preoperatively.  The aortic and superior vena cava cannula were removed uneventfully. Protamine was administered to reverse the anticoagulation. The femoral venous cannula was removed and Perclose sutures secured.  Manual pressure held on the groin for 30 minutes.  The mediastinum and pleural space were inspected for hemostasis and irrigated with saline solution.  The patient received a total of 1 pack adult platelets and 2 units fresh frozen plasma due to coagulopathy and thrombocytopenia after separation from cardiopulmonary bypass and reversal of heparin with protamine.  The mediastinum and both pleural spaces were drained using 4 chest tubes placed through separate stab incisions inferiorly.  The soft tissues anterior to the aorta were reapproximated loosely. The sternum is closed with Fibertape cerclage. The soft tissues anterior to the sternum were closed in multiple layers and the skin is closed with a running subcuticular skin closure.  The post-bypass portion of the operation was notable for stable rhythm and hemodynamics.    Disposition:  The patient tolerated the procedure well.  The patient was transported to the surgical intensive care unit in stable condition. There were no intraoperative complications. All sponge instrument and needle counts are verified correct at completion of the operation.    Valentina Gu. Roxy Manns MD 04/13/2019 2:28 PM

## 2019-04-13 NOTE — Progress Notes (Signed)
Subjective: Resting comfortably in Preop bed. No new issues overnight.  Objective: Vital signs in last 24 hours: Temp:  [97.8 F (36.6 C)-98.3 F (36.8 C)] 98 F (36.7 C) (03/05 0425) Pulse Rate:  [75-79] 75 (03/05 0425) Resp:  [20] 20 (03/05 0425) BP: (100-124)/(50-71) 112/69 (03/05 0425) SpO2:  [90 %-100 %] 92 % (03/05 0425) Weight:  [96.3 kg] 96.3 kg (03/05 0425)  Physical Exam: General:Resting in bed. No acute distress. Eyes: Pupils are equal, round, reactive to light. Extraocular motion is intact.  Ears: Examination of the ears shows normal auricles and external auditory canals bilaterally.  Nose: Nasal examination shows normal mucosa, septum, turbinates.  Face: Facial examination shows no asymmetry. Palpation of the face elicit no significant tenderness.  Mouth: Oral cavity examination shows no mucosal lacerations. No significant trismus is noted. No palpable stone or purulent drainage from the salivary ducts. Neck: Palpation of the neck reveals minimal left submandibular swelling. The trachea is midline.  Chest: No respiratory distress.   Recent Labs    04/11/19 0515 04/13/19 0500  WBC 14.7* 11.7*  HGB 12.2 14.1  HCT 40.3 45.6  PLT 201 218   Recent Labs    04/12/19 0326 04/13/19 0500  NA 139 139  K 3.9 4.2  CL 93* 86*  CO2 37* 41*  GLUCOSE 104* 103*  BUN 23 25*  CREATININE 1.28* 1.44*  CALCIUM 9.1 9.8    Medications:  I have reviewed the patient's current medications. Scheduled: . [MAR Hold] aspirin EC  81 mg Oral QHS  . [MAR Hold] atorvastatin  40 mg Oral QHS  . [MAR Hold] azelastine  2 spray Each Nare QHS  . chlorhexidine  60 mL Topical Once  . [MAR Hold] chlorhexidine  15 mL Mouth/Throat BID  . chlorhexidine  15 mL Mouth/Throat Once  . [MAR Hold] enoxaparin (LOVENOX) injection  40 mg Subcutaneous Q24H  . epinephrine  0-10 mcg/min Intravenous To OR  . [MAR Hold] FLUoxetine  20 mg Oral q1800  . [MAR Hold] fluticasone  2 spray Each Nare QHS  .  [MAR Hold] furosemide  80 mg Intravenous BID  . glutaraldehyde   Topical To OR  . heparin-papaverine-plasmalyte irrigation   Irrigation To OR  . [MAR Hold] hydroxychloroquine  200 mg Oral BID  . insulin   Intravenous To OR  . [place] Blood Cardioplegia vial (lidocaine/magnesium/mannitol 0.26g-4g-6.4g)   Intracoronary To OR  . [MAR Hold] levothyroxine  125 mcg Oral QAC breakfast  . [MAR Hold] liothyronine  5 mcg Oral Daily  . [MAR Hold] metoprolol tartrate  12.5 mg Oral q AM   And  . [MAR Hold] metoprolol tartrate  50 mg Oral QHS  . [MAR Hold] pantoprazole  40 mg Oral Daily  . phenylephrine  30-200 mcg/min Intravenous To OR  . potassium chloride  80 mEq Other To OR  . [MAR Hold] sodium chloride flush  10-40 mL Intracatheter Q12H  . [MAR Hold] sodium chloride flush  3 mL Intravenous Q12H  . [MAR Hold] spironolactone  12.5 mg Oral Daily  . tranexamic acid  15 mg/kg Intravenous To OR  . tranexamic acid  2 mg/kg Intracatheter To OR  . vancomycin 1000 mg in NS (1000 ml) irrigation for Dr. Roxy Manns case   Irrigation To OR   Continuous: . [MAR Hold] sodium chloride    . dexmedetomidine    . [MAR Hold] ertapenem 1,000 mg (04/12/19 1445)  . heparin 30,000 units/NS 1000 mL solution for CELLSAVER    . levofloxacin (LEVAQUIN)  IV    . milrinone    . nitroGLYCERIN    . norepinephrine    . tranexamic acid (CYKLOKAPRON) infusion ([facility])    . vancomycin      Assessment/Plan: Mild left submandibular swelling, possibly secondary to sialadenitis. - CT yesterday showed no significant infection. - Encourage hydration, if not contraindicated by her cardiac status. - No other intervention is needed. - Patient may follow up with me as an outpatient as needed.   LOS: 3 days   Xoie Kreuser W Demerius Podolak 04/13/2019, 7:50 AM

## 2019-04-13 NOTE — Progress Notes (Signed)
  Echocardiogram Echocardiogram Transesophageal has been performed.  Rebecca Carr 04/13/2019, 8:58 AM

## 2019-04-13 NOTE — Progress Notes (Signed)
Advanced Heart Failure Rounding Note  PCP-Cardiologist: No primary care provider on file.   Subjective:    Underwent complex MV repair earlier today.  Remains intubated. Will arouse.   On milrinone 0.3 epi 2 neo 60  A-paced at 80  SBP 100-110  Swan#s PA 56/20 (32) Thermo 8.1/3.8   Objective:   Weight Range: 96.3 kg Body mass index is 32.26 kg/m.   Vital Signs:   Temp:  [93.9 F (34.4 C)-98.3 F (36.8 C)] 97.5 F (36.4 C) (03/05 2000) Pulse Rate:  [75-80] 80 (03/05 2008) Resp:  [8-27] 12 (03/05 2008) BP: (94-124)/(53-71) 114/60 (03/05 2000) SpO2:  [90 %-100 %] 99 % (03/05 2008) Arterial Line BP: (72-135)/(44-82) 111/51 (03/05 2000) FiO2 (%):  [50 %] 50 % (03/05 2008) Weight:  [96.3 kg] 96.3 kg (03/05 0425) Last BM Date: 04/12/19  Weight change: Filed Weights   04/11/19 2000 04/12/19 0456 04/13/19 0425  Weight: 101.7 kg 99.5 kg 96.3 kg    Intake/Output:   Intake/Output Summary (Last 24 hours) at 04/13/2019 2021 Last data filed at 04/13/2019 2000 Gross per 24 hour  Intake 7772.61 ml  Output 5880 ml  Net 1892.61 ml      Physical Exam    PHYSICAL EXAM: General:  Intubated/sedated but will arouse HEENT: normal + ETT Neck: supple. RIJ swan. Carotids 2+ bilat; no bruits. No lymphadenopathy or thryomegaly appreciated. Cor: sternal dressing ok. RRR. + CTs Lungs: coarse Abdomen: soft, nontender, + distended. No hepatosplenomegaly. No bruits or masses. Quiet Extremities: no cyanosis, clubbing, rash, 1+ edema Neuro: Intubated/sedated but will arouse   Telemetry   A-paced 80 Personally reviewed  Labs    CBC Recent Labs    04/11/19 0515 04/13/19 0500 04/13/19 1841 04/13/19 2000  WBC 14.7*   < > 14.4* 13.0*  NEUTROABS 11.7*  --   --   --   HGB 12.2   < > 8.2* 7.5*  HCT 40.3   < > 25.8* 24.1*  MCV 95.7   < > 91.5 93.1  PLT 201   < > 147* 135*   < > = values in this interval not displayed.   Basic Metabolic Panel Recent Labs    04/12/19  0326 04/12/19 0326 04/13/19 0500 04/13/19 0839 04/13/19 1232 04/13/19 1232 04/13/19 1315 04/13/19 1315 04/13/19 1321 04/13/19 1503  NA 139   < > 139   < > 135   < > 135   < > 135 136  K 3.9   < > 4.2   < > 3.6   < > 3.2*   < > 3.2* 3.5  CL 93*   < > 86*   < > 92*  --  92*  --   --   --   CO2 37*  --  41*  --   --   --   --   --   --   --   GLUCOSE 104*   < > 103*   < > 154*  --  136*  --   --   --   BUN 23   < > 25*   < > 20  --  21  --   --   --   CREATININE 1.28*   < > 1.44*   < > 0.90  --  0.90  --   --   --   CALCIUM 9.1  --  9.8  --   --   --   --   --   --   --    < > =  values in this interval not displayed.   Liver Function Tests Recent Labs    04/12/19 0326  AST 23  ALT 38  ALKPHOS 72  BILITOT 2.1*  PROT 5.9*  ALBUMIN 3.5   No results for input(s): LIPASE, AMYLASE in the last 72 hours. Cardiac Enzymes No results for input(s): CKTOTAL, CKMB, CKMBINDEX, TROPONINI in the last 72 hours.  BNP: BNP (last 3 results) No results for input(s): BNP in the last 8760 hours.  ProBNP (last 3 results) No results for input(s): PROBNP in the last 8760 hours.   D-Dimer No results for input(s): DDIMER in the last 72 hours. Hemoglobin A1C Recent Labs    04/12/19 0326  HGBA1C 6.3*   Fasting Lipid Panel No results for input(s): CHOL, HDL, LDLCALC, TRIG, CHOLHDL, LDLDIRECT in the last 72 hours. Thyroid Function Tests No results for input(s): TSH, T4TOTAL, T3FREE, THYROIDAB in the last 72 hours.  Invalid input(s): FREET3  Other results:   Imaging    DG Chest Port 1 View  Result Date: 04/13/2019 CLINICAL DATA:  Status post bypass surgery and mitral valve surgery. EXAM: PORTABLE CHEST 1 VIEW COMPARISON:  Chest x-ray 04/11/2019 FINDINGS: The endotracheal tube is 4.5 cm above the carina. The NG tube is coursing down the esophagus and into the stomach. The right IJ Swan-Ganz catheter tip is in the proximal right pulmonary artery. Right-sided chest tube in good position  without pneumothorax. Mediastinal drain tubes are in place and a left atrial appendage closure device is noted. The heart is enlarged but stable. The mediastinal and hilar contours are within normal limits and unchanged. Mild vascular congestion but no pulmonary edema, pleural effusions or pneumothorax. IMPRESSION: 1. Postoperative support apparatus in good position without complicating features. 2. Stable cardiac enlargement and mild central vascular congestion. No edema, effusions or pneumothorax. Electronically Signed   By: Marijo Sanes M.D.   On: 04/13/2019 16:12   ECHO INTRAOPERATIVE TEE  Result Date: 04/13/2019  *INTRAOPERATIVE TRANSESOPHAGEAL REPORT *  Patient Name:   Rebecca Carr Date of Exam: 04/13/2019 Medical Rec #:  [id number]        Height:       68.0 in Accession #:    [id number]       Weight:       212.2 lb Date of Birth:  1950-06-12        BSA:          2.10 m Patient Age:    69 years         BP:           112/69 mmHg Patient Gender: F                HR:           75 bpm. Exam Location:  Anesthesiology Transesophogeal exam was perform intraoperatively during surgical procedure. Patient was closely monitored under general anesthesia during the entirety of examination. Indications:     mitral valve regurgitation Sonographer:     Jannett Celestine RDCS (AE) Performing Phys: Beecher Phys: Annye Asa MD Report CC'd to:  Darylene Price MD Complications: No known complications during this procedure. POST-OP IMPRESSIONS - Left Ventricle: Post MV repair, the LV has mildly reduced systolic function, with an ejection fraction of 40-50%. This is improving with time off of CPB. There are no regional wall motion abnormalities. The cavity size remains mildly dilated. - Aortic Valve: The aortic valve appears unchanged from pre-bypass. There is no regurgitation. - Mitral  Valve: The mitral valve is now repaired. There is trivial to no residual MR. There is no mitral stenosis, with peak  gradient 7 mmHg, mean gradient 2 mmHg. The annular ring has intact suture line, with no leak. - Tricuspid Valve: The tricuspid valve appears unchanged from pre-bypass. There is trivial regurgitation. PRE-OP FINDINGS  Left Ventricle: The left ventricle has hyperdynamic systolic function, with an ejection fraction of >65%, calculated 64-70%. The cavity size was moderately dilated. There is no increase in left ventricular wall thickness. Right Ventricle: The right ventricle has normal systolic function. The cavity was normal. There is no increase in right ventricular wall thickness. Right ventricular systolic pressure is normal. Left Atrium: Left atrial size was dilated. The left atrial appendage is well visualized and there is no evidence of thrombus present. Left atrial appendage velocity is normal at greater than 40 cm/s. Right Atrium: Right atrial size was normal in size. Right atrial pressure is estimated at 10 mmHg. Interatrial Septum: No atrial level shunt detected by color flow Doppler. There is right bowing of the interatrial septum, suggestive of elevated left atrial pressure. Pericardium: There is no evidence of pericardial effusion. Mitral Valve: The mitral valve appears myxomatous, with mild thickening of the mitral valve leaflets. Mitral valve regurgitation is severe by color flow Doppler. The MR jet is anteriorly-directed. Pulmonary venous flow shows systolic flow reversal. There  is severe prolapse of of the mitral valve, with flail of the middle scallop of the posterior mitral valve leaflet. There appear to be several ruptured chordae to P2. There may be very mild Mitral stenosis, with mean gradient 5 mmHg. Tricuspid Valve: The tricuspid valve was normal in structure. Tricuspid valve regurgitation was not visualized by color flow Doppler. There is no evidence of tricuspid valve vegetation. Aortic Valve: The aortic valve is tricuspid. Aortic valve regurgitation was not visualized by color flow Doppler.  There is no stenosis of the aortic valve. There is mild aortic annular calcification noted. There is no evidence of a vegetation on the aortic valve. Pulmonic Valve: The pulmonic valve was normal in structure, with normal. No evidence of pulmonic stenosis. Pulmonic valve regurgitation is trivial, around the PA catheter, by color flow Doppler. Aorta: The aortic root, ascending aorta and aortic arch are normal in size and structure. Pulmonary Artery: Gordy Councilman catheter present on the right. The pulmonary artery is mildly dilated. Pulmonary hypertension is severe. Venous: The inferior vena cava is normal in size with less than 50% respiratory variability, suggesting right atrial pressure of 8 mmHg. +-------------+--------++ AORTIC VALVE          +-------------+--------++ AV Mean Grad:6.0 mmHg +-------------+--------++ +-------------+--------++ MITRAL VALVE          +-------------+--------++ MV Mean grad:5.0 mmHg +-------------+--------++  Annye Asa MD Electronically signed by Annye Asa MD Signature Date/Time: 04/13/2019/3:21:00 PM    Final      Medications:     Scheduled Medications: . [START ON 04/14/2019] acetaminophen  1,000 mg Oral Q6H   Or  . [START ON 04/14/2019] acetaminophen (TYLENOL) oral liquid 160 mg/5 mL  1,000 mg Per Tube Q6H  . [START ON 04/14/2019] aspirin EC  325 mg Oral Daily   Or  . [START ON 04/14/2019] aspirin  324 mg Per Tube Daily  . [START ON 04/14/2019] bisacodyl  10 mg Oral Daily   Or  . [START ON 04/14/2019] bisacodyl  10 mg Rectal Daily  . Chlorhexidine Gluconate Cloth  6 each Topical Daily  . [START ON 04/14/2019] docusate sodium  200 mg Oral Daily  . FLUoxetine  20 mg Oral q1800  . levalbuterol  1.25 mg Nebulization Q6H  . levothyroxine  125 mcg Oral QAC breakfast  . liothyronine  5 mcg Oral Daily  . mometasone-formoterol  2 puff Inhalation BID  . [START ON 04/15/2019] pantoprazole  40 mg Oral Daily  . [START ON 04/14/2019] sodium chloride flush  3 mL  Intravenous Q12H    Infusions: . sodium chloride 20 mL/hr at 04/13/19 1500  . sodium chloride 100 mL/hr at 04/13/19 2000  . [START ON 04/14/2019] sodium chloride    . sodium chloride 10 mL/hr at 04/13/19 1450  . albumin human 12.5 g (04/13/19 1759)  . dexmedetomidine (PRECEDEX) IV infusion 0.7 mcg/kg/hr (04/13/19 2000)  . epinephrine 2 mcg/min (04/13/19 2000)  . ertapenem    . famotidine (PEPCID) IV Stopped (04/13/19 1634)  . insulin 1.2 Units/hr (04/13/19 2000)  . lactated ringers    . lactated ringers 100 mL/hr at 04/13/19 1929  . lactated ringers 20 mL/hr at 04/13/19 1500  . magnesium sulfate 20 mL/hr at 04/13/19 2000  . milrinone 0.3 mcg/kg/min (04/13/19 2000)  . nitroGLYCERIN Stopped (04/13/19 1600)  . phenylephrine (NEO-SYNEPHRINE) Adult infusion 60 mcg/min (04/13/19 2000)  . potassium chloride    . vancomycin      PRN Medications: sodium chloride, albumin human, dextrose, lactated ringers, metoprolol tartrate, midazolam, morphine injection, ondansetron (ZOFRAN) IV, oxyCODONE, [START ON 04/14/2019] sodium chloride flush, traMADol   Assessment/Plan    1. Mitral Regurgitation, severe - ECHO/TEE with severe MR P2 prolapse - s/p complex MV repair 3/5 - stable immediately post-op on milrinone, epi and neo. Wean as tolerated - maintaining NSR - having some bleeding from MT. Will follow - wean vent as tolerated. Likely extubate in am - start diuresis in am   2. A/C Diastolic Heart Failure - ECHO 03/2019 EF 60-65% grade II DD  - s/p MVR today. Swan outputs ok  - begin diuresis in am   3. Acute postop hypoxic respiratory failure - wean vent as tolerated - likely extubate in am   4. Acute blood loss anemia - watch MT - transfuse as needed. Keep hgb > 8.0  5. H/o lupus and Sjogren's disease - followed at [facility] - her neck/ jaw pain in the area of the Stensen's duct may possibly be related to Sjogren's syndrome and salivary gland involvement.  - CT  unremarkable.  - continue abx for now   CRITICAL CARE Performed by: Glori Bickers  Total critical care time: 35 minutes  Critical care time was exclusive of separately billable procedures and treating other patients.  Critical care was necessary to treat or prevent imminent or life-threatening deterioration.  Critical care was time spent personally by me (independent of midlevel providers or residents) on the following activities: development of treatment plan with patient and/or surrogate as well as nursing, discussions with consultants, evaluation of patient's response to treatment, examination of patient, obtaining history from patient or surrogate, ordering and performing treatments and interventions, ordering and review of laboratory studies, ordering and review of radiographic studies, pulse oximetry and re-evaluation of patient's condition.      Length of Stay: 3  Glori Bickers, MD  04/13/2019, 8:21 PM  Advanced Heart Failure Team Pager [phone] (M-F; 7a - 4p)  Please contact [facility] for night-coverage after hours (4p -7a ) and weekends on [email or web address]  Patient seen and examined with the above-signed Advanced Practice Provider and/or Housestaff. I personally reviewed laboratory data, imaging studies  and relevant notes. I independently examined the patient and formulated the important aspects of the plan. I have edited the note to reflect any of my changes or salient points. I have personally discussed the plan with the patient and/or family.  Diuresing well. Breathing better. PFT with significant COPD and will require open sternotomy. Still c/o pain in left submandibular region. Dental w/u unrevealing. + constipated. No CP.   Lying in bed NAD JVP 7-8  Tender to palpation in left submandibular region Cor RRR 3/6 MR Lungs decreased BS throughout Ab obese NT Ext 1+edema  Diuresing well. Still volume overloaded. Continue IV lasix. Discussed neck pain with ENT. CT  obtained and no clear abnormality,. Pre-op testing reviewed. Has signfiicant COPD. Will need open sternotomy for MVR tomorrow. D/w Dr. Roxy Manns at bedside. She sholud be ready for OR in am.   Glori Bickers, MD  8:21 PM

## 2019-04-13 NOTE — Anesthesia Procedure Notes (Signed)
Arterial Line Insertion Start/End3/06/2019 6:51 AM, 04/13/2019 6:51 AM Performed by: Kathryne Hitch, CRNA, CRNA  Patient location: Pre-op. Preanesthetic checklist: patient identified, IV checked, site marked, risks and benefits discussed, surgical consent, monitors and equipment checked, pre-op evaluation, timeout performed and anesthesia consent Lidocaine 1% used for infiltration Left, radial was placed Catheter size: 20 Fr Hand hygiene performed  and maximum sterile barriers used   Attempts: 1 Procedure performed without using ultrasound guided technique. Following insertion, dressing applied and Biopatch. Post procedure assessment: normal and unchanged  Patient tolerated the procedure well with no immediate complications.

## 2019-04-14 ENCOUNTER — Inpatient Hospital Stay (HOSPITAL_COMMUNITY): Payer: Medicare Other

## 2019-04-14 DIAGNOSIS — J431 Panlobular emphysema: Secondary | ICD-10-CM

## 2019-04-14 LAB — PREPARE FRESH FROZEN PLASMA
Unit division: 0
Unit division: 0
Unit division: 0

## 2019-04-14 LAB — POCT I-STAT 7, (LYTES, BLD GAS, ICA,H+H)
Acid-Base Excess: 2 mmol/L (ref 0.0–2.0)
Acid-Base Excess: 3 mmol/L — ABNORMAL HIGH (ref 0.0–2.0)
Acid-Base Excess: 2 mmol/L (ref 0.0–2.0)
Acid-Base Excess: 3 mmol/L — ABNORMAL HIGH (ref 0.0–2.0)
Acid-Base Excess: 3 mmol/L — ABNORMAL HIGH (ref 0.0–2.0)
Bicarbonate: 29.2 mmol/L — ABNORMAL HIGH (ref 20.0–28.0)
Bicarbonate: 29.2 mmol/L — ABNORMAL HIGH (ref 20.0–28.0)
Bicarbonate: 29.6 mmol/L — ABNORMAL HIGH (ref 20.0–28.0)
Bicarbonate: 31.1 mmol/L — ABNORMAL HIGH (ref 20.0–28.0)
Bicarbonate: 30.9 mmol/L — ABNORMAL HIGH (ref 20.0–28.0)
Calcium, Ion: 1.27 mmol/L (ref 1.15–1.40)
Calcium, Ion: 1.24 mmol/L (ref 1.15–1.40)
Calcium, Ion: 1.28 mmol/L (ref 1.15–1.40)
Calcium, Ion: 1.26 mmol/L (ref 1.15–1.40)
Calcium, Ion: 1.25 mmol/L (ref 1.15–1.40)
HCT: 29 % — ABNORMAL LOW (ref 36.0–46.0)
HCT: 26 % — ABNORMAL LOW (ref 36.0–46.0)
HCT: 26 % — ABNORMAL LOW (ref 36.0–46.0)
HCT: 27 % — ABNORMAL LOW (ref 36.0–46.0)
HCT: 27 % — ABNORMAL LOW (ref 36.0–46.0)
Hemoglobin: 9.9 g/dL — ABNORMAL LOW (ref 12.0–15.0)
Hemoglobin: 8.8 g/dL — ABNORMAL LOW (ref 12.0–15.0)
Hemoglobin: 8.8 g/dL — ABNORMAL LOW (ref 12.0–15.0)
Hemoglobin: 9.2 g/dL — ABNORMAL LOW (ref 12.0–15.0)
Hemoglobin: 9.2 g/dL — ABNORMAL LOW (ref 12.0–15.0)
O2 Saturation: 93 %
O2 Saturation: 92 %
O2 Saturation: 94 %
O2 Saturation: 97 %
O2 Saturation: 96 %
Patient temperature: 35.8
Patient temperature: 36.4
Patient temperature: 36.8
Patient temperature: 36.7
Patient temperature: 36.8
Potassium: 4.2 mmol/L (ref 3.5–5.1)
Potassium: 4.3 mmol/L (ref 3.5–5.1)
Potassium: 4.3 mmol/L (ref 3.5–5.1)
Potassium: 4.4 mmol/L (ref 3.5–5.1)
Potassium: 4.5 mmol/L (ref 3.5–5.1)
Sodium: 137 mmol/L (ref 135–145)
Sodium: 138 mmol/L (ref 135–145)
Sodium: 139 mmol/L (ref 135–145)
Sodium: 138 mmol/L (ref 135–145)
Sodium: 138 mmol/L (ref 135–145)
TCO2: 31 mmol/L (ref 22–32)
TCO2: 31 mmol/L (ref 22–32)
TCO2: 32 mmol/L (ref 22–32)
TCO2: 33 mmol/L — ABNORMAL HIGH (ref 22–32)
TCO2: 33 mmol/L — ABNORMAL HIGH (ref 22–32)
pCO2 arterial: 54.7 mmHg — ABNORMAL HIGH (ref 32.0–48.0)
pCO2 arterial: 54.2 mmHg — ABNORMAL HIGH (ref 32.0–48.0)
pCO2 arterial: 63.9 mmHg — ABNORMAL HIGH (ref 32.0–48.0)
pCO2 arterial: 68.1 mmHg (ref 32.0–48.0)
pCO2 arterial: 64.6 mmHg — ABNORMAL HIGH (ref 32.0–48.0)
pH, Arterial: 7.33 — ABNORMAL LOW (ref 7.350–7.450)
pH, Arterial: 7.336 — ABNORMAL LOW (ref 7.350–7.450)
pH, Arterial: 7.273 — ABNORMAL LOW (ref 7.350–7.450)
pH, Arterial: 7.267 — ABNORMAL LOW (ref 7.350–7.450)
pH, Arterial: 7.287 — ABNORMAL LOW (ref 7.350–7.450)
pO2, Arterial: 67 mmHg — ABNORMAL LOW (ref 83.0–108.0)
pO2, Arterial: 67 mmHg — ABNORMAL LOW (ref 83.0–108.0)
pO2, Arterial: 80 mmHg — ABNORMAL LOW (ref 83.0–108.0)
pO2, Arterial: 112 mmHg — ABNORMAL HIGH (ref 83.0–108.0)
pO2, Arterial: 92 mmHg (ref 83.0–108.0)

## 2019-04-14 LAB — GLUCOSE, CAPILLARY
Glucose-Capillary: 124 mg/dL — ABNORMAL HIGH (ref 70–99)
Glucose-Capillary: 140 mg/dL — ABNORMAL HIGH (ref 70–99)
Glucose-Capillary: 142 mg/dL — ABNORMAL HIGH (ref 70–99)
Glucose-Capillary: 142 mg/dL — ABNORMAL HIGH (ref 70–99)
Glucose-Capillary: 143 mg/dL — ABNORMAL HIGH (ref 70–99)
Glucose-Capillary: 144 mg/dL — ABNORMAL HIGH (ref 70–99)
Glucose-Capillary: 146 mg/dL — ABNORMAL HIGH (ref 70–99)
Glucose-Capillary: 146 mg/dL — ABNORMAL HIGH (ref 70–99)
Glucose-Capillary: 147 mg/dL — ABNORMAL HIGH (ref 70–99)
Glucose-Capillary: 147 mg/dL — ABNORMAL HIGH (ref 70–99)
Glucose-Capillary: 148 mg/dL — ABNORMAL HIGH (ref 70–99)
Glucose-Capillary: 149 mg/dL — ABNORMAL HIGH (ref 70–99)
Glucose-Capillary: 152 mg/dL — ABNORMAL HIGH (ref 70–99)
Glucose-Capillary: 157 mg/dL — ABNORMAL HIGH (ref 70–99)
Glucose-Capillary: 165 mg/dL — ABNORMAL HIGH (ref 70–99)
Glucose-Capillary: 173 mg/dL — ABNORMAL HIGH (ref 70–99)

## 2019-04-14 LAB — BPAM FFP
Blood Product Expiration Date: 202103102359
Blood Product Expiration Date: 202103102359
Blood Product Expiration Date: 202103102359
Blood Product Expiration Date: 202103102359
ISSUE DATE / TIME: 202103051356
ISSUE DATE / TIME: 202103051356
ISSUE DATE / TIME: 202103051757
ISSUE DATE / TIME: 202103051757
Unit Type and Rh: 600
Unit Type and Rh: 6200
Unit Type and Rh: 600
Unit Type and Rh: 6200

## 2019-04-14 LAB — CBC
HCT: 26.2 % — ABNORMAL LOW (ref 36.0–46.0)
HCT: 28.1 % — ABNORMAL LOW (ref 36.0–46.0)
Hemoglobin: 8.3 g/dL — ABNORMAL LOW (ref 12.0–15.0)
Hemoglobin: 8.8 g/dL — ABNORMAL LOW (ref 12.0–15.0)
MCH: 28.8 pg (ref 26.0–34.0)
MCH: 29.4 pg (ref 26.0–34.0)
MCHC: 31.7 g/dL (ref 30.0–36.0)
MCHC: 31.3 g/dL (ref 30.0–36.0)
MCV: 91 fL (ref 80.0–100.0)
MCV: 94 fL (ref 80.0–100.0)
Platelets: 102 10*3/uL — ABNORMAL LOW (ref 150–400)
Platelets: 96 10*3/uL — ABNORMAL LOW (ref 150–400)
RBC: 2.88 MIL/uL — ABNORMAL LOW (ref 3.87–5.11)
RBC: 2.99 MIL/uL — ABNORMAL LOW (ref 3.87–5.11)
RDW: 13.4 % (ref 11.5–15.5)
RDW: 14 % (ref 11.5–15.5)
WBC: 11.9 10*3/uL — ABNORMAL HIGH (ref 4.0–10.5)
WBC: 16 10*3/uL — ABNORMAL HIGH (ref 4.0–10.5)
nRBC: 0 % (ref 0.0–0.2)
nRBC: 0 % (ref 0.0–0.2)

## 2019-04-14 LAB — BASIC METABOLIC PANEL
Anion gap: 9 (ref 5–15)
Anion gap: 8 (ref 5–15)
BUN: 16 mg/dL (ref 8–23)
BUN: 19 mg/dL (ref 8–23)
CO2: 27 mmol/L (ref 22–32)
CO2: 28 mmol/L (ref 22–32)
Calcium: 8.6 mg/dL — ABNORMAL LOW (ref 8.9–10.3)
Calcium: 8.7 mg/dL — ABNORMAL LOW (ref 8.9–10.3)
Chloride: 101 mmol/L (ref 98–111)
Chloride: 102 mmol/L (ref 98–111)
Creatinine, Ser: 0.9 mg/dL (ref 0.44–1.00)
Creatinine, Ser: 0.91 mg/dL (ref 0.44–1.00)
GFR calc Af Amer: >60 mL/min (ref >60)
GFR calc Af Amer: >60 mL/min (ref >60)
GFR calc non Af Amer: >60 mL/min (ref >60)
GFR calc non Af Amer: >60 mL/min (ref >60)
Glucose, Bld: 165 mg/dL — ABNORMAL HIGH (ref 70–99)
Glucose, Bld: 155 mg/dL — ABNORMAL HIGH (ref 70–99)
Potassium: 4.3 mmol/L (ref 3.5–5.1)
Potassium: 4.6 mmol/L (ref 3.5–5.1)
Sodium: 137 mmol/L (ref 135–145)
Sodium: 138 mmol/L (ref 135–145)

## 2019-04-14 LAB — PREPARE PLATELET PHERESIS
Unit division: 0
Unit division: 0

## 2019-04-14 LAB — BLOOD GAS, ARTERIAL
Acid-Base Excess: 2.6 mmol/L — ABNORMAL HIGH (ref 0.0–2.0)
Acid-Base Excess: 4.5 mmol/L — ABNORMAL HIGH (ref 0.0–2.0)
Allens test (pass/fail): UNDETERMINED — AB
Bicarbonate: 27.6 mmol/L (ref 20.0–28.0)
Bicarbonate: 28.3 mmol/L — ABNORMAL HIGH (ref 20.0–28.0)
Drawn by: 53675
FIO2: 50
FIO2: 100
O2 Saturation: 99 %
O2 Saturation: 97.9 %
Patient temperature: 35.8
Patient temperature: 36.9
pCO2 arterial: 47.8 mmHg (ref 32.0–48.0)
pCO2 arterial: 40.5 mmHg (ref 32.0–48.0)
pH, Arterial: 7.373 (ref 7.350–7.450)
pH, Arterial: 7.458 — ABNORMAL HIGH (ref 7.350–7.450)
pO2, Arterial: 138 mmHg — ABNORMAL HIGH (ref 83.0–108.0)
pO2, Arterial: 152 mmHg — ABNORMAL HIGH (ref 83.0–108.0)

## 2019-04-14 LAB — BPAM PLATELET PHERESIS
Blood Product Expiration Date: 202103072359
Blood Product Expiration Date: 202103082359
ISSUE DATE / TIME: 202103051340
ISSUE DATE / TIME: 202103051421
Unit Type and Rh: 7300
Unit Type and Rh: 8400

## 2019-04-14 LAB — MAGNESIUM
Magnesium: 2.2 mg/dL (ref 1.7–2.4)
Magnesium: 2.2 mg/dL (ref 1.7–2.4)

## 2019-04-14 MED ORDER — EPINEPHRINE HCL 5 MG/250ML IV SOLN IN NS
0.0000 ug/min | INTRAVENOUS | Status: DC
  Administered 2019-04-14 – 2019-04-15 (×2): 1 ug/min via INTRAVENOUS
  Filled 2019-04-14: qty 250

## 2019-04-14 MED ORDER — INSULIN ASPART 100 UNIT/ML ~~LOC~~ SOLN
0.0000 [IU] | SUBCUTANEOUS | Status: DC
  Administered 2019-04-14 – 2019-04-18 (×20): 2 [IU] via SUBCUTANEOUS
  Administered 2019-04-18: 4 [IU] via SUBCUTANEOUS
  Administered 2019-04-18 – 2019-04-19 (×4): 2 [IU] via SUBCUTANEOUS
  Administered 2019-04-19: 4 [IU] via SUBCUTANEOUS

## 2019-04-14 MED ORDER — SODIUM CHLORIDE 0.9% FLUSH: 10.0000 mL | INTRAVENOUS | Status: DC | PRN

## 2019-04-14 MED ORDER — EPINEPHRINE HCL 5 MG/250ML IV SOLN IN NS
0.0000 ug/min | INTRAVENOUS | Status: AC
  Administered 2019-04-14: 1 ug/min via INTRAVENOUS

## 2019-04-14 MED ORDER — FENTANYL 2500MCG IN NS 250ML (10MCG/ML) PREMIX INFUSION
25.0000 ug/h | INTRAVENOUS | Status: DC
  Administered 2019-04-14 – 2019-04-15 (×2): 100 ug/h via INTRAVENOUS
  Administered 2019-04-17: 150 ug/h via INTRAVENOUS
  Administered 2019-04-17: 200 ug/h via INTRAVENOUS
  Filled 2019-04-14 (×5): qty 250

## 2019-04-14 MED ORDER — ROCURONIUM BROMIDE 10 MG/ML (PF) SYRINGE
60.0000 mg | PREFILLED_SYRINGE | Freq: Once | INTRAVENOUS | Status: AC
  Administered 2019-04-14: 60 mg via INTRAVENOUS

## 2019-04-14 MED ORDER — FENTANYL CITRATE (PF) 100 MCG/2ML IJ SOLN
INTRAMUSCULAR | Status: AC
  Administered 2019-04-14: 100 ug
  Filled 2019-04-14: qty 2

## 2019-04-14 MED ORDER — PANTOPRAZOLE SODIUM 40 MG IV SOLR
40.0000 mg | Freq: Every day | INTRAVENOUS | Status: DC
  Administered 2019-04-14 – 2019-04-18 (×5): 40 mg via INTRAVENOUS
  Filled 2019-04-14 (×5): qty 40

## 2019-04-14 MED ORDER — ETOMIDATE 2 MG/ML IV SOLN
20.0000 mg | Freq: Once | INTRAVENOUS | Status: AC
  Administered 2019-04-14: 20 mg via INTRAVENOUS

## 2019-04-14 MED ORDER — FUROSEMIDE 10 MG/ML IJ SOLN
20.0000 mg | Freq: Once | INTRAMUSCULAR | Status: AC
  Administered 2019-04-14: 20 mg via INTRAVENOUS
  Filled 2019-04-14: qty 2

## 2019-04-14 MED ORDER — ROCURONIUM BROMIDE 50 MG/5ML IV SOLN
60.0000 mg | Freq: Once | INTRAVENOUS | Status: AC
  Administered 2019-04-14: 60 mg via INTRAVENOUS

## 2019-04-14 MED ORDER — FUROSEMIDE 10 MG/ML IJ SOLN
10.0000 mg/h | INTRAVENOUS | Status: DC
  Administered 2019-04-14 – 2019-04-16 (×2): 6 mg/h via INTRAVENOUS
  Administered 2019-04-17: 10 mg/h via INTRAVENOUS
  Administered 2019-04-18 (×2): 15 mg/h via INTRAVENOUS
  Administered 2019-04-19: 10 mg/h via INTRAVENOUS
  Filled 2019-04-14 (×8): qty 25

## 2019-04-14 MED ORDER — SODIUM CHLORIDE 0.9% FLUSH
10.0000 mL | Freq: Two times a day (BID) | INTRAVENOUS | Status: DC
  Administered 2019-04-15: 10 mL
  Administered 2019-04-16: 20 mL
  Administered 2019-04-16 – 2019-04-18 (×3): 10 mL

## 2019-04-14 MED ORDER — FENTANYL BOLUS VIA INFUSION
25.0000 ug | INTRAVENOUS | Status: DC | PRN
  Administered 2019-04-17 (×2): 25 ug via INTRAVENOUS
  Filled 2019-04-14: qty 25

## 2019-04-14 MED ORDER — DEXMEDETOMIDINE HCL IN NACL 400 MCG/100ML IV SOLN
0.4000 ug/kg/h | INTRAVENOUS | Status: DC
  Administered 2019-04-14: 1 ug/kg/h via INTRAVENOUS
  Administered 2019-04-14 (×2): 1.2 ug/kg/h via INTRAVENOUS
  Administered 2019-04-15: 0.8 ug/kg/h via INTRAVENOUS
  Administered 2019-04-15: 0.7 ug/kg/h via INTRAVENOUS
  Administered 2019-04-15: 22:00:00 0.8 ug/kg/h via INTRAVENOUS
  Administered 2019-04-15: 0.7 ug/kg/h via INTRAVENOUS
  Administered 2019-04-15: 01:00:00 1.2 ug/kg/h via INTRAVENOUS
  Administered 2019-04-16: 0.7 ug/kg/h via INTRAVENOUS
  Administered 2019-04-16: 0.9 ug/kg/h via INTRAVENOUS
  Administered 2019-04-16: 0.7 ug/kg/h via INTRAVENOUS
  Administered 2019-04-17: 0.9 ug/kg/h via INTRAVENOUS
  Administered 2019-04-17 – 2019-04-18 (×3): 1.2 ug/kg/h via INTRAVENOUS
  Administered 2019-04-18: 0.8 ug/kg/h via INTRAVENOUS
  Filled 2019-04-14 (×8): qty 100
  Filled 2019-04-14: qty 200
  Filled 2019-04-14 (×11): qty 100

## 2019-04-14 MED ORDER — FENTANYL CITRATE (PF) 100 MCG/2ML IJ SOLN
25.0000 ug | Freq: Once | INTRAMUSCULAR | Status: AC
  Administered 2019-04-14: 25 ug via INTRAVENOUS

## 2019-04-14 NOTE — Consult Note (Signed)
NAME:  Rebecca Carr, MRN:  [id number], DOB:  [birth year], LOS: 4 ADMISSION DATE:  04/10/2019, CONSULTATION DATE: 3/6  REFERRING MD:  Darcey Nora, CHIEF COMPLAINT: Acute on chronic respiratory failure  Brief History   69 year old female with chronic respiratory failure and underlying history of severe mitral valve disease and pulmonary hypertension.  Underwent mitral valve repair on 3/5, extubated on 3/6 but developed progressive somnolence and hypercarbia therefore pulmonary asked to evaluate and assist with care.  History of present illness   This is a 69 year old female who Was admitted on 3/2 and underwent complex valvuloplasty of the mitral valve.  Has known history of pulmonary hypertension.  Has a history of chronic respiratory failure with chronic PCO2 in the 50s.  Return to the intensive care postoperatively on 3/5.40 of interventions included milrinone, Neo-Synephrine, atrial pacing, and IV Lasix.  Was successfully extubated on 3/6 however had progressive somnolence and worsening hypercarbic respiratory failure.  Because of this critical care was asked to evaluate.  Past Medical History   Severe mitral valve prolapse, chronic diastolic heart failure,Pulmonary hypertension Antiphospholipid syndrome Arthritis GERD Hashimoto's thyroiditis, hypothyroidism. COPD Chronic respiratory failure OSA  Significant Hospital Events   3/4 admitted 3/5 underwent mitral valve repair 3/6: Developed progressive hypercarbia post extubation pulmonary asked to evaluate.  Intubated.  Consults:  Thoracic surgery Advanced heart failure team Cardiology   Procedures:  Right IJ pulmonary artery catheter 3/5 Oral intubation 3/6  Significant Diagnostic Tests:    Micro Data:    Antimicrobials:    Interim history/subjective:  Slow to respond   Objective   Blood pressure 114/68, pulse 93, temperature 98.4 F (36.9 C), resp. rate 15, height _0  (1.727 m), weight 108.7 kg, SpO2 96 %. PAP:  (40-79)/(18-33) 69/28 CO:  [3.3 L/min-8.2 L/min] 7.2 L/min CI:  [1.6 L/min/m2-3.9 L/min/m2] 3.4 L/min/m2  Vent Mode: BIPAP FiO2 (%):  [40 %-50 %] 50 % Set Rate:  [4 bmp-12 bmp] 10 bmp Vt Set:  [510 mL] 510 mL PEEP:  [5 cmH20-10 cmH20] 6 cmH20 Pressure Support:  [10 cmH20] 10 cmH20 Plateau Pressure:  [23 cmH20-25 cmH20] 23 cmH20   Intake/Output Summary (Last 24 hours) at 04/14/2019 1620 Last data filed at 04/14/2019 1600 Gross per 24 hour  Intake 5990.15 ml  Output 2835 ml  Net 3155.15 ml   Filed Weights   04/12/19 0456 04/13/19 0425 04/14/19 0500  Weight: 99.5 kg 96.3 kg 108.7 kg    Examination: General: Chronically ill-appearing 69 year old white female currently on BiPAP HENT: Normocephalic atraumatic BiPAP mask in place Lungs: Decreased throughout, no accessory use. Cardiovascular: Paced rhythm, sternotomy dressing intact appreciated, mediastinal tubes in place Abdomen: Soft nontender Extremities: Warm dry Neuro: Awake, slow to respond, moves all extremities GU: Clear yellow  Resolved Hospital Problem list     Assessment & Plan:   Acute on chronic hypercarbic respiratory failure, status post mitral valve repair.  Suspect mixed picture of decreased ventilatory effort post cardio-thoracic surgery, underlying restrictive lung disease +/-emphysema, and possibly residual sedation meds Portable chest x-ray personally reviewed: From a.m. film showed endotracheal tube in satisfactory position, left-sided pulmonary edema, some improved aeration of the right lower lobe.  Chest tubes in place. Plan Full ventilator support PAD protocol, RASS goal -2 VAP bundle Continue IV Lasix Initiate nitric inhaled Follow-up chest x-ray  Best practice:  Diet: NPO Pain/Anxiety/Delirium protocol (if indicated): 3/6 VAP protocol (if indicated): 3/6 DVT prophylaxis:   Per primary  GI prophylaxis: PPI Glucose control: ssi Mobility: bedrest Code Status: full code  Family Communication: per  primary  Disposition: icu   Labs   CBC: Recent Labs  Lab 04/11/19 0515 04/11/19 0515 04/13/19 0500 04/13/19 0839 04/13/19 1225 04/13/19 1232 04/13/19 1453 04/13/19 1503 04/13/19 1841 04/13/19 1841 04/13/19 2000 04/13/19 2000 04/14/19 0259 04/14/19 0259 04/14/19 0315 04/14/19 1113 04/14/19 1228 04/14/19 1401 04/14/19 1526  WBC 14.7*   < > 11.7*  --   --   --  15.8*  --  14.4*  --  13.0*  --  11.9*  --   --   --   --   --   --   NEUTROABS 11.7*  --   --   --   --   --   --   --   --   --   --   --   --   --   --   --   --   --   --   HGB 12.2   < > 14.1   < > 9.9*   < > 10.1*   < > 8.2*   < > 7.5*   < > 8.3*   < > 9.9* 8.8* 8.8* 9.2* 9.2*  HCT 40.3   < > 45.6   < > 30.6*   < > 31.0*   < > 25.8*   < > 24.1*   < > 26.2*   < > 29.0* 26.0* 26.0* 27.0* 27.0*  MCV 95.7   < > 93.4  --   --   --  90.9  --  91.5  --  93.1  --  91.0  --   --   --   --   --   --   PLT 201   < > 218   < > 79*  --  105*  --  147*  --  135*  --  102*  --   --   --   --   --   --    < > = values in this interval not displayed.    Basic Metabolic Panel: Recent Labs  Lab 04/11/19 0515 04/11/19 0515 04/12/19 0326 04/12/19 0326 04/13/19 0500 04/13/19 0839 04/13/19 1101 04/13/19 1101 04/13/19 1212 04/13/19 1212 04/13/19 1232 04/13/19 1232 04/13/19 1315 04/13/19 1321 04/14/19 0259 04/14/19 0259 04/14/19 0315 04/14/19 1113 04/14/19 1228 04/14/19 1401 04/14/19 1526  NA 139   < > 139   < > 139   < > 132*   < > 134*   < > 135   < > 135   < > 137   < > 137 138 139 138 138  K 4.2   < > 3.9   < > 4.2   < > 3.7   < > 3.3*   < > 3.6   < > 3.2*   < > 4.3   < > 4.2 4.3 4.3 4.4 4.5  CL 97*   < > 93*   < > 86*   < > 88*  --  92*  --  92*  --  92*  --  101  --   --   --   --   --   --   CO2 34*  --  37*  --  41*  --   --   --   --   --   --   --   --   --  27  --   --   --   --   --   --  GLUCOSE 120*   < > 104*   < > 103*   < > 170*  --  161*  --  154*  --  136*  --  165*  --   --   --   --   --   --     BUN 24*   < > 23   < > 25*   < > 21  --  20  --  20  --  21  --  16  --   --   --   --   --   --   CREATININE 1.22*   < > 1.28*   < > 1.44*   < > 1.00  --  1.00  --  0.90  --  0.90  --  0.90  --   --   --   --   --   --   CALCIUM 9.0  --  9.1  --  9.8  --   --   --   --   --   --   --   --   --  8.6*  --   --   --   --   --   --   MG  --   --   --   --   --   --   --   --   --   --   --   --   --   --  2.2  --   --   --   --   --   --    < > = values in this interval not displayed.   GFR: Estimated Creatinine Clearance: 77.3 mL/min (by C-G formula based on SCr of 0.9 mg/dL). Recent Labs  Lab 04/13/19 1453 04/13/19 1841 04/13/19 2000 04/14/19 0259  WBC 15.8* 14.4* 13.0* 11.9*    Liver Function Tests: Recent Labs  Lab 04/12/19 0326  AST 23  ALT 38  ALKPHOS 72  BILITOT 2.1*  PROT 5.9*  ALBUMIN 3.5   No results for input(s): LIPASE, AMYLASE in the last 168 hours. No results for input(s): AMMONIA in the last 168 hours.  ABG    Component Value Date/Time   PHART 7.287 (L) 04/14/2019 1526   PCO2ART 64.6 (H) 04/14/2019 1526   PO2ART 92.0 04/14/2019 1526   HCO3 30.9 (H) 04/14/2019 1526   TCO2 33 (H) 04/14/2019 1526   O2SAT 96.0 04/14/2019 1526     Coagulation Profile: Recent Labs  Lab 04/13/19 1453  INR 1.5*    Cardiac Enzymes: No results for input(s): CKTOTAL, CKMB, CKMBINDEX, TROPONINI in the last 168 hours.  HbA1C: Hgb A1c MFr Bld  Date/Time Value Ref Range Status  04/12/2019 03:26 AM 6.3 (H) 4.8 - 5.6 % Final    Comment:    (NOTE) Pre diabetes:          5.7%-6.4% Diabetes:              >6.4% Glycemic control for   <7.0% adults with diabetes     CBG: Recent Labs  Lab 04/14/19 0908 04/14/19 1110 04/14/19 1226 04/14/19 1359 04/14/19 1611  GLUCAP 149* 144* 142* 152* 146*    Review of Systems:   Not able   Past Medical History  She,  has a past medical history of Antiphospholipid syndrome ([place]), Anxiety, Arthritis, Clotting disorder ([place]), COPD  (chronic obstructive pulmonary disease) ([place]), DES exposure in utero, Diverticulosis of colon, Dizziness, Environmental and seasonal  allergies, GERD (gastroesophageal reflux disease), Hashimoto's thyroiditis, Heart palpitations, Hiatal hernia, History of adenomatous polyp of colon, History of esophageal dilatation, History of esophagitis, History of pituitary tumor, Hyperlipidemia, Hypothyroidism, Leg pain, bilateral, Mild intermittent asthma, Moderate mitral regurgitation by prior echocardiography (2012), MTHFR mutation ([place]), MVP (mitral valve prolapse), OSA (obstructive sleep apnea), Prediabetes, S/P mitral valve repair (04/13/2019), SUI (stress urinary incontinence, female), VIN III (vulvar intraepithelial neoplasia III), and Wears glasses.   Surgical History    Past Surgical History:  Procedure Laterality Date  . BUBBLE STUDY  04/10/2019   Procedure: BUBBLE STUDY;  Surgeon: Sueanne Margarita, MD;  Location: [facility] ENDOSCOPY;  Service: Cardiovascular;;  . CARDIAC EVENT MONITOR  July-August 2018   Normal sinus rhythm with sinus tachycardia.  Average heart rate 91 bpm.  Occasional PVCs.  One 12 and another 6 beat run of wide-complex tachycardia.  2 morphology PVCs.PVCs noted, but not during longer episode of WCT.  Marland Kitchen CESAREAN SECTION  1994  . CO2 LASER APPLICATION N/A 3/00/7622   Procedure: CO2 LASER APPLICATION ;  Surgeon: Everitt Amber, MD;  Location[facility];  Service: Gynecology;  Laterality: N/A;  . COLONOSCOPY, ESOPHAGOGASTRODUODENOSCOPY (EGD) AND ESOPHAGEAL DILATION  10-04-2013  . LAPAROSCOPIC CHOLECYSTECTOMY  1995  . LASER ABLATION CONDOLAMATA N/A 07/03/2015   Procedure: CO2 LASER OF VULVA ;  Surgeon: Everitt Amber, MD;  Location: [facility];  Service: Gynecology;  Laterality: N/A;  . NM MYOVIEW LTD  10/2016   LOW RISK.  No ischemia or infarction.  EF 54%.  Marland Kitchen RIGHT/LEFT HEART CATH AND CORONARY ANGIOGRAPHY N/A 04/10/2019   Procedure: RIGHT/LEFT HEART CATH AND CORONARY  ANGIOGRAPHY;  Surgeon: Leonie Man, MD;  Location: [facility];  Service: Cardiovascular;  Laterality: N/A;  . TEE WITHOUT CARDIOVERSION N/A 04/10/2019   Procedure: TRANSESOPHAGEAL ECHOCARDIOGRAM (TEE);  Surgeon: Sueanne Margarita, MD;  Location: [facility] ENDOSCOPY;  Service: Cardiovascular;  Laterality: N/A;  . TRANSTHORACIC ECHOCARDIOGRAM  11/2010   Normal LV Size & Fxn - EF 55-60%. Mild-Mod MR. Normal DF.  Marland Kitchen TRANSTHORACIC ECHOCARDIOGRAM  08/2016   EF 60 of 65%.  Severe LVH.  Pseudo-normal filling (grade 2 diastolic dysfunction).  MAC with calcified mobile attachments.  Posterior leaflet prolapse.  Mild regurgitation.  Moderate LA dilation.     Social History   reports that she quit smoking about 4 years ago. Her smoking use included cigarettes. She has a 48.00 pack-year smoking history. She has never used smokeless tobacco. She reports current alcohol use of about 2.0 standard drinks of alcohol per week. She reports that she does not use drugs.   Family History   Her family history includes COPD in her brother; Colon cancer in her father and maternal grandfather; Dementia in her brother; Heart attack in her brother and mother; Heart disease in her mother; Pancreatic cancer in her father. There is no history of Rectal cancer, Stomach cancer, or Esophageal cancer.   Allergies Allergies  Allergen Reactions  . Iodine Anaphylaxis  . Iohexol Anaphylaxis     Desc: Patient states she is allergic to iodine and had a "code blue" incident after an injection for some type of imaging study.   . Penicillins Rash    PCN IN LARGE DOSES. Did it involve swelling of the face/tongue/throat, SOB, or low BP? No Did it involve sudden or severe rash/hives, skin peeling, or any reaction on the inside of your mouth or nose? No Did you need to seek medical attention at a hospital or  doctor's office? No When did it last happen? More than 10 years ago If all above answers are "NO", may proceed with  cephalosporin use.      Home Medications  Prior to Admission medications   Medication Sig Start Date End Date Taking? Authorizing Provider  ALBUTEROL IN Inhale 1-2 puffs into the lungs every 6 (six) hours as needed.    Yes [provider]  aspirin EC 81 MG tablet Take 81 mg by mouth at bedtime. Reported on 07/16/2015   Yes [provider]  atorvastatin (LIPITOR) 40 MG tablet Take 40 mg by mouth at bedtime.    Yes [provider]  azelastine (ASTELIN) 0.1 % nasal spray Place 2 sprays into both nostrils at bedtime.  07/14/16  Yes [provider]  Cyanocobalamin (VITAMIN B-12) 5000 MCG LOZG Take 5,000 mcg by mouth daily.   Yes [provider]  FLUoxetine (PROZAC) 20 MG capsule Take 20 mg by mouth daily at 6 PM. (1700)   Yes [provider]  fluticasone (FLONASE) 50 MCG/ACT nasal spray Place 2 sprays into both nostrils at bedtime.    Yes [provider]  furosemide (LASIX) 40 MG tablet TAKE 20 MG TO 40 MG TWICE A DAY AS NEEDED FOR SWELLING Patient taking differently: Take 20 mg by mouth 2 (two) times daily as needed (swelling.).  03/28/19  Yes Leonie Man, MD  hydroxychloroquine (PLAQUENIL) 200 MG tablet Take 200 mg by mouth 2 (two) times daily.  07/03/18  Yes [provider]  Javier Docker Oil 500 MG CAPS Take 500 mg by mouth daily.   Yes [provider]  levothyroxine (SYNTHROID) 125 MCG tablet Take 125 mcg by mouth daily before breakfast.   Yes [provider]  liothyronine (CYTOMEL) 5 MCG tablet Take 5 mcg by mouth daily. 07/07/17  Yes [provider]  metoprolol tartrate (LOPRESSOR) 25 MG tablet TAKE 1/2 TABLET (12.5 MG) IN THE MORNING AND 2 TABLETS (50 MG) AT NIGHT. Patient taking differently: Take 12.5-50 mg by mouth See admin instructions. TAKE 1/2 TABLET (12.5 MG) IN THE MORNING AND 2 TABLETS (50 MG) AT NIGHT. 03/28/19  Yes Leonie Man, MD  Multiple Vitamin (MULTIVITAMIN WITH MINERALS) TABS tablet  Take 1 tablet by mouth daily. Multivitamin Essentials   Yes [provider]  neomycin-bacitracin-polymyxin (NEOSPORIN) ointment Apply 1 application topically as needed for wound care (applied to affected area of foot.).   Yes [provider]  nicotine polacrilex (NICORETTE) 2 MG gum Take 2 mg by mouth as needed for smoking cessation.   Yes [provider]  omeprazole (PRILOSEC) 40 MG capsule TAKE 1 CAPSULE(40 MG) BY MOUTH TWICE DAILY Patient taking differently: Take 40 mg by mouth daily before breakfast.  02/05/19  Yes Ladene Artist, MD  predniSONE (DELTASONE) 50 MG tablet Contrast Allergy: Yes, Please take Prednisone 31m by mouth at: Thirteen hours prior to cath 9:00pm on Monday,Seven hours prior to cath 3:00am on Tuesday And prior to leaving home please take last dose of Prednisone 567mand Benadryl 5081my mouth. 6 AM 03/28/19  Yes HarLeonie ManD     Critical care time: 35 min     PetErick ColaceNP-BC [facility] # [phone] # [phone] no answer

## 2019-04-14 NOTE — Procedures (Signed)
Intubation Procedure Note Marcel Sorter [id number] 1950/07/19  Procedure: Intubation Indications: Respiratory insufficiency  Procedure Details Consent: Unable to obtain consent because of emergent medical necessity. Time Out: Verified patient identification, verified procedure, site/side was marked, verified correct patient position, special equipment/implants available, medications/allergies/relevent history reviewed, required imaging and test results available.  Performed  Maximum sterile technique was used including antiseptics, cap, gloves, hand hygiene and mask.  MAC and 3    Evaluation Hemodynamic Status: BP stable throughout; O2 sats: stable throughout Patient's Current Condition: stable Complications: No apparent complications Patient did tolerate procedure well. Chest X-ray ordered to verify placement.  CXR: pending.   Candee Furbish 04/14/2019

## 2019-04-14 NOTE — Progress Notes (Signed)
Pt chest tube drainage improved, vitals stable. Precedex and vent weaning began. Pt advised of progression.

## 2019-04-14 NOTE — Anesthesia Postprocedure Evaluation (Signed)
Anesthesia Post Note  Patient: [name]  Procedure(s) Performed: MITRAL VALVE REPAIR (MVR) using Memo 4D 34 MM Mitral Valve. (N/A Chest) TRANSESOPHAGEAL ECHOCARDIOGRAM (TEE) (N/A ) Clipping Of Atrial Appendage using AtriCure 45 MM AtriClip. (N/A Chest)     Patient location during evaluation: SICU Anesthesia Type: General Level of consciousness: awake and alert, patient cooperative and oriented Pain management: pain level controlled Respiratory status: spontaneous breathing, nonlabored ventilation, respiratory function stable and patient connected to nasal cannula oxygen (recently extubated) Cardiovascular status: pt remains on Neo BP support. Postop Assessment: no apparent nausea or vomiting Anesthetic complications: no    Last Vitals:  Vitals:   04/14/19 1100 04/14/19 1125  BP: (!) 101/57 (!) 110/59  Pulse:  84  Resp: 12 18  Temp: (!) 36.4 C   SpO2: 96%     Last Pain:  Vitals:   04/14/19 0725  TempSrc:   PainSc: 0-No pain                 Dereon Williamsen,E. Zoua Caporaso

## 2019-04-14 NOTE — Progress Notes (Signed)
CT surgery p.m. Rounds  Patient extubated this a.m. postop day 1 with adequate ABG mechanics however she developed hypercarbia not corrected with BiPAP.  Appreciate CCM assistance with reintubation and ventilator management.  Inhaled nitric oxide added because of patient's severe pulmonary hypertension.  We will rest on ventilator with inhaled nitric oxide.  Start tube feeds tomorrow and continue Lasix drip with good urine output

## 2019-04-14 NOTE — Progress Notes (Signed)
Advanced Heart Failure Rounding Note  PCP-Cardiologist: No primary care provider on file.   Subjective:    S/p complex MV repair on 3/5   Remains intubated but awake and following commands.   On milrinone 0.3 and neo 20.  Epi off.  Now on Lasix drip.  A pacing at 80.  Swan numbers checked personally PA 73/31 mean of 43 Thermodilution 6.56/3.12  Objective:   Weight Range: 108.7 kg Body mass index is 36.44 kg/m.   Vital Signs:   Temp:  [93.9 F (34.4 C)-98.4 F (36.9 C)] 98.4 F (36.9 C) (03/06 1300) Pulse Rate:  [80-93] 93 (03/06 1246) Resp:  [8-27] 21 (03/06 1300) BP: (94-124)/(53-73) 107/59 (03/06 1300) SpO2:  [93 %-100 %] 97 % (03/06 1310) Arterial Line BP: (72-352)/(44-348) 108/56 (03/06 1300) FiO2 (%):  [40 %-50 %] 50 % (03/06 1310) Weight:  [108.7 kg] 108.7 kg (03/06 0500) Last BM Date: 04/12/19  Weight change: Filed Weights   04/12/19 0456 04/13/19 0425 04/14/19 0500  Weight: 99.5 kg 96.3 kg 108.7 kg    Intake/Output:   Intake/Output Summary (Last 24 hours) at 04/14/2019 1326 Last data filed at 04/14/2019 1300 Gross per 24 hour  Intake 8001.32 ml  Output 3845 ml  Net 4156.32 ml      Physical Exam    PHYSICAL EXAM: General:  Awake on vent following commands HEENT: normal + ETT Neck: supple. RIJ swan  Carotids 2+ bilat; no bruits. No lymphadenopathy or thryomegaly appreciated. Cor: Sternal wound okay PMI nondisplaced. Regular rate & rhythm. No rubs, gallops or murmurs.  + Chest tubes Lungs: Coarse Abdomen: Obese soft, nontender, nondistended. No hepatosplenomegaly. No bruits or masses.  Hypoactive bowel sounds. Extremities: no cyanosis, clubbing, rash, 2+ edema Neuro: awake on vent following commands   Telemetry   A paced 80 personally reviewed  Labs    CBC Recent Labs    04/13/19 2000 04/13/19 2000 04/14/19 0259 04/14/19 0315 04/14/19 1113 04/14/19 1228  WBC 13.0*  --  11.9*  --   --   --   HGB 7.5*   < > 8.3*   < > 8.8* 8.8*    HCT 24.1*   < > 26.2*   < > 26.0* 26.0*  MCV 93.1  --  91.0  --   --   --   PLT 135*  --  102*  --   --   --    < > = values in this interval not displayed.   Basic Metabolic Panel Recent Labs    04/13/19 0500 04/13/19 0839 04/13/19 1315 04/13/19 1321 04/14/19 0259 04/14/19 0315 04/14/19 1113 04/14/19 1228  NA 139   < > 135   < > 137   < > 138 139  K 4.2   < > 3.2*   < > 4.3   < > 4.3 4.3  CL 86*   < > 92*  --  101  --   --   --   CO2 41*  --   --   --  27  --   --   --   GLUCOSE 103*   < > 136*  --  165*  --   --   --   BUN 25*   < > 21  --  16  --   --   --   CREATININE 1.44*   < > 0.90  --  0.90  --   --   --   CALCIUM 9.8  --   --   --  8.6*  --   --   --   MG  --   --   --   --  2.2  --   --   --    < > = values in this interval not displayed.   Liver Function Tests Recent Labs    04/12/19 0326  AST 23  ALT 38  ALKPHOS 72  BILITOT 2.1*  PROT 5.9*  ALBUMIN 3.5   No results for input(s): LIPASE, AMYLASE in the last 72 hours. Cardiac Enzymes No results for input(s): CKTOTAL, CKMB, CKMBINDEX, TROPONINI in the last 72 hours.  BNP: BNP (last 3 results) No results for input(s): BNP in the last 8760 hours.  ProBNP (last 3 results) No results for input(s): PROBNP in the last 8760 hours.   D-Dimer No results for input(s): DDIMER in the last 72 hours. Hemoglobin A1C Recent Labs    04/12/19 0326  HGBA1C 6.3*   Fasting Lipid Panel No results for input(s): CHOL, HDL, LDLCALC, TRIG, CHOLHDL, LDLDIRECT in the last 72 hours. Thyroid Function Tests No results for input(s): TSH, T4TOTAL, T3FREE, THYROIDAB in the last 72 hours.  Invalid input(s): FREET3  Other results:   Imaging    DG Chest Port 1 View  Result Date: 04/14/2019 CLINICAL DATA:  Postop day 1 mitral valve repair and clipping of the LEFT atrial appendage. EXAM: PORTABLE CHEST 1 VIEW COMPARISON:  04/13/2019 and earlier. FINDINGS: Endotracheal tube tip in satisfactory position projecting  approximately 6 cm above the carina. Swan-Ganz catheter tip projects at the expected location the main pulmonary trunk. NG/OG tube courses below the diaphragm into the stomach. BILATERAL chest tubes remain in place with no pneumothorax. Mitral valve annuloplasty and LEFT atrial appendage clipping. Cardiac silhouette moderately enlarged, unchanged. New focal airspace opacity in the LEFT UPPER LOBE. Stable dense consolidation in the LEFT LOWER LOBE. Improved aeration in the RIGHT lung base. IMPRESSION: 1. Support apparatus satisfactory. 2. No pneumothorax. 3. New atelectasis and/or pneumonia in the LEFT UPPER LOBE and stable dense atelectasis in the LEFT LOWER LOBE. Improved aeration in the RIGHT lung base. Electronically Signed   By: Evangeline Dakin M.D.   On: 04/14/2019 09:09   DG Chest Port 1 View  Result Date: 04/13/2019 CLINICAL DATA:  Status post bypass surgery and mitral valve surgery. EXAM: PORTABLE CHEST 1 VIEW COMPARISON:  Chest x-ray 04/11/2019 FINDINGS: The endotracheal tube is 4.5 cm above the carina. The NG tube is coursing down the esophagus and into the stomach. The right IJ Swan-Ganz catheter tip is in the proximal right pulmonary artery. Right-sided chest tube in good position without pneumothorax. Mediastinal drain tubes are in place and a left atrial appendage closure device is noted. The heart is enlarged but stable. The mediastinal and hilar contours are within normal limits and unchanged. Mild vascular congestion but no pulmonary edema, pleural effusions or pneumothorax. IMPRESSION: 1. Postoperative support apparatus in good position without complicating features. 2. Stable cardiac enlargement and mild central vascular congestion. No edema, effusions or pneumothorax. Electronically Signed   By: Marijo Sanes M.D.   On: 04/13/2019 16:12     Medications:     Scheduled Medications: . acetaminophen  1,000 mg Oral Q6H   Or  . acetaminophen (TYLENOL) oral liquid 160 mg/5 mL  1,000 mg Per  Tube Q6H  . aspirin EC  325 mg Oral Daily   Or  . aspirin  324 mg Per Tube Daily  . bisacodyl  10 mg Oral Daily   Or  .  bisacodyl  10 mg Rectal Daily  . chlorhexidine gluconate (MEDLINE KIT)  15 mL Mouth Rinse BID  . Chlorhexidine Gluconate Cloth  6 each Topical Daily  . docusate sodium  200 mg Oral Daily  . FLUoxetine  20 mg Oral q1800  . insulin aspart  0-24 Units Subcutaneous Q4H  . levalbuterol  1.25 mg Nebulization Q6H  . levothyroxine  125 mcg Oral QAC breakfast  . liothyronine  5 mcg Oral Daily  . mouth rinse  15 mL Mouth Rinse 10 times per day  . mometasone-formoterol  2 puff Inhalation BID  . [START ON 04/15/2019] pantoprazole  40 mg Oral Daily  . sodium chloride flush  10-40 mL Intracatheter Q12H  . sodium chloride flush  3 mL Intravenous Q12H    Infusions: . sodium chloride 20 mL/hr at 04/13/19 1500  . sodium chloride    . sodium chloride 10 mL/hr at 04/13/19 1450  . albumin human 12.5 g (04/13/19 1759)  . ertapenem Stopped (04/13/19 2328)  . furosemide (LASIX) infusion 6 mg/hr (04/14/19 1300)  . insulin 1.3 mL/hr at 04/14/19 1300  . lactated ringers 100 mL/hr at 04/13/19 1929  . lactated ringers 20 mL/hr at 04/14/19 1300  . milrinone 0.3 mcg/kg/min (04/14/19 1300)  . phenylephrine (NEO-SYNEPHRINE) Adult infusion 20 mcg/min (04/14/19 1300)    PRN Medications: sodium chloride, albumin human, dextrose, metoprolol tartrate, midazolam, morphine injection, ondansetron (ZOFRAN) IV, oxyCODONE, sodium chloride flush, sodium chloride flush, traMADol   Assessment/Plan    1. Mitral Regurgitation, severe - ECHO/TEE with severe MR P2 prolapse - s/p complex MV repair 3/5 - Doing well hemodynamically on milrinone 0.3 and low-dose neo.  Epi off. - Plan extubation today.  Blood gas looks acceptable. - Management of chest tubes per CT surgery -Agree with Lasix drip.  2. A/C Diastolic Heart Failure - ECHO 03/2019 EF 60-65% grade II DD  - s/p MVR. Swan outputs ok.  He is  markedly volume overloaded -Agree with Lasix drip.  3. Acute postop hypoxic respiratory failure -Remains on vent.  Hopefully extubate this a.m.  ABG looks good  4. Acute blood loss anemia - transfuse as needed. Keep hgb > 8.3  5. H/o lupus and Sjogren's disease - followed at [facility] - her neck/ jaw pain in the area of the Stensen's duct may possibly be related to Sjogren's syndrome and salivary gland involvement.  - CT unremarkable.  -We will give 3 days of IV antibiotics and stop.  CRITICAL CARE Performed by: Glori Bickers  Total critical care time: 35 minutes  Critical care time was exclusive of separately billable procedures and treating other patients.  Critical care was necessary to treat or prevent imminent or life-threatening deterioration.  Critical care was time spent personally by me (independent of midlevel providers or residents) on the following activities: development of treatment plan with patient and/or surrogate as well as nursing, discussions with consultants, evaluation of patient's response to treatment, examination of patient, obtaining history from patient or surrogate, ordering and performing treatments and interventions, ordering and review of laboratory studies, ordering and review of radiographic studies, pulse oximetry and re-evaluation of patient's condition.      Length of Stay: [place], MD  04/14/2019, 1:26 PM  Advanced Heart Failure Team Pager [phone] (M-F; [place])  Please contact [facility] for night-coverage after hours (4p -7a ) and weekends on [email or web address]

## 2019-04-14 NOTE — Procedures (Signed)
Extubation Procedure Note  Patient Details:   Name: Rebecca Carr DOB: 12/16/1950 MRN: [id number]   Airway Documentation:    Vent end date: 04/14/19 Vent end time: 1120   Evaluation  O2 sats: stable throughout Complications: No apparent complications Patient did tolerate procedure well. Bilateral Breath Sounds: Clear, Diminished   Yes   Patient extubated to 4L nasal cannula per protocol.  Positive cuff leak noted.  No evidence of stridor.  Patient able to speak post extubation.  NIF of -24, VC of 656mL.  Sats and vitals currently stable.  No complications noted.   Judith Part 04/14/2019, 11:26 AM

## 2019-04-14 NOTE — Progress Notes (Signed)
RT started patient on nitric 20ppm, per MD order. Patient tolerating well at this time. RT will continue to monitor.

## 2019-04-14 NOTE — Progress Notes (Signed)
[place] Progress Note Patient Name: Rebecca Carr DOB: 31-Mar-1950 MRN: [id number]   Date of Service  04/14/2019  HPI/Events of Note  Request for restraints Patient seen awake and somewhat restless on Precedex and Fentanyl with prn Versed  eICU Interventions  Bilateral soft wrist restraint ordered     Intervention Category Minor Interventions: Agitation / anxiety - evaluation and management  Judd Lien 04/14/2019, 8:23 PM

## 2019-04-14 NOTE — Progress Notes (Signed)
Patient placed on bipap per MD order due to post extubation ABG results.  Tolerating well at this time.  Will continue to monitor.

## 2019-04-14 NOTE — Progress Notes (Signed)
1 Day Post-Op Procedure(s) (LRB): MITRAL VALVE REPAIR (MVR) using Memo 4D 34 MM Mitral Valve. (N/A) TRANSESOPHAGEAL ECHOCARDIOGRAM (TEE) (N/A) Clipping Of Atrial Appendage using AtriCure 45 MM AtriClip. (N/A) Subjective: Awake and ready to wean vent nsr A-paced Cardiac output 5.5L  Objective: Vital signs in last 24 hours: Temp:  [93.9 F (34.4 C)-97.7 F (36.5 C)] 96.4 F (35.8 C) (03/06 0900) Pulse Rate:  [80] 80 (03/06 0737) Cardiac Rhythm: Atrial paced (03/06 0800) Resp:  [8-27] 12 (03/06 0900) BP: (94-124)/(53-73) 104/58 (03/06 0900) SpO2:  [93 %-100 %] 97 % (03/06 0900) Arterial Line BP: (72-352)/(44-348) 103/50 (03/06 0900) FiO2 (%):  [40 %-50 %] 50 % (03/06 0737) Weight:  [108.7 kg] 108.7 kg (03/06 0500)  Hemodynamic parameters for last 24 hours: PAP: (31-59)/(11-33) 58/23 CO:  [3.3 L/min-8.2 L/min] 7.2 L/min CI:  [1.6 L/min/m2-3.9 L/min/m2] 3.4 L/min/m2  Intake/Output from previous day: 03/05 0701 - 03/06 0700 In: 10464.1 [I.V.:4906.1; Blood:2435.5; IV Piggyback:3122.5] Out: [id number] [Urine:2780; Blood:975; Chest Tube:1130] Intake/Output this shift: Total I/O In: 110.1 [I.V.:110.1] Out: 195 [Urine:85; Chest Tube:110]       Exam    General- alert and comfortable    Neck- no JVD, no cervical adenopathy palpable, no carotid bruit   Lungs- clear without rales, wheezes   Cor- regular rate and rhythm, no murmur , gallop   Abdomen- soft, non-tender   Extremities - warm, non-tender, minimal edema   Neuro- oriented, appropriate, no focal weakness   Lab Results: Recent Labs    04/13/19 2000 04/13/19 2000 04/14/19 0259 04/14/19 0315  WBC 13.0*  --  11.9*  --   HGB 7.5*   < > 8.3* 9.9*  HCT 24.1*   < > 26.2* 29.0*  PLT 135*  --  102*  --    < > = values in this interval not displayed.   BMET:  Recent Labs    04/13/19 0500 04/13/19 0839 04/13/19 1315 04/13/19 1321 04/14/19 0259 04/14/19 0315  NA 139   < > 135   < > 137 137  K 4.2   < > 3.2*   < > 4.3  4.2  CL 86*   < > 92*  --  101  --   CO2 41*  --   --   --  27  --   GLUCOSE 103*   < > 136*  --  165*  --   BUN 25*   < > 21  --  16  --   CREATININE 1.44*   < > 0.90  --  0.90  --   CALCIUM 9.8  --   --   --  8.6*  --    < > = values in this interval not displayed.    PT/INR:  Recent Labs    04/13/19 1453  LABPROT 18.0*  INR 1.5*   ABG    Component Value Date/Time   PHART 7.373 04/14/2019 0849   HCO3 27.6 04/14/2019 0849   TCO2 31 04/14/2019 0315   O2SAT 99.0 04/14/2019 0849   CBG (last 3)  Recent Labs    04/14/19 0647 04/14/19 0801 04/14/19 0908  GLUCAP 143* 142* 149*    Assessment/Plan: S/P Procedure(s) (LRB): MITRAL VALVE REPAIR (MVR) using Memo 4D 34 MM Mitral Valve. (N/A) TRANSESOPHAGEAL ECHOCARDIOGRAM (TEE) (N/A) Clipping Of Atrial Appendage using AtriCure 45 MM AtriClip. (N/A) Mobilize Diuresis Diabetes control See progression orders Lasix drip  LOS: 4 days    Rebecca Carr 04/14/2019

## 2019-04-14 NOTE — Procedures (Signed)
Attempted weaning however patient is not quite ready.  NIF attempted x3 with the best of -23.  Patient was not able to do NIF the remaining 2 attempts.  Please see ABG results as well.  Will attempt weaning later in the morning.

## 2019-04-15 ENCOUNTER — Inpatient Hospital Stay (HOSPITAL_COMMUNITY): Payer: Medicare Other

## 2019-04-15 DIAGNOSIS — J9622 Acute and chronic respiratory failure with hypercapnia: Secondary | ICD-10-CM

## 2019-04-15 DIAGNOSIS — Z9911 Dependence on respirator [ventilator] status: Secondary | ICD-10-CM

## 2019-04-15 DIAGNOSIS — J9621 Acute and chronic respiratory failure with hypoxia: Secondary | ICD-10-CM

## 2019-04-15 DIAGNOSIS — Z952 Presence of prosthetic heart valve: Secondary | ICD-10-CM

## 2019-04-15 LAB — CBC
HCT: 27.6 % — ABNORMAL LOW (ref 36.0–46.0)
Hemoglobin: 8.9 g/dL — ABNORMAL LOW (ref 12.0–15.0)
MCH: 29.3 pg (ref 26.0–34.0)
MCHC: 32.2 g/dL (ref 30.0–36.0)
MCV: 90.8 fL (ref 80.0–100.0)
Platelets: 97 10*3/uL — ABNORMAL LOW (ref 150–400)
RBC: 3.04 MIL/uL — ABNORMAL LOW (ref 3.87–5.11)
RDW: 13.6 % (ref 11.5–15.5)
WBC: 16.6 10*3/uL — ABNORMAL HIGH (ref 4.0–10.5)
nRBC: 0 % (ref 0.0–0.2)

## 2019-04-15 LAB — POCT I-STAT 7, (LYTES, BLD GAS, ICA,H+H)
Acid-Base Excess: 8 mmol/L — ABNORMAL HIGH (ref 0.0–2.0)
Bicarbonate: 30.7 mmol/L — ABNORMAL HIGH (ref 20.0–28.0)
Calcium, Ion: 1.14 mmol/L — ABNORMAL LOW (ref 1.15–1.40)
HCT: 28 % — ABNORMAL LOW (ref 36.0–46.0)
Hemoglobin: 9.5 g/dL — ABNORMAL LOW (ref 12.0–15.0)
O2 Saturation: 93 %
Patient temperature: 37.3
Potassium: 3.4 mmol/L — ABNORMAL LOW (ref 3.5–5.1)
Sodium: 134 mmol/L — ABNORMAL LOW (ref 135–145)
TCO2: 32 mmol/L (ref 22–32)
pCO2 arterial: 33.3 mmHg (ref 32.0–48.0)
pH, Arterial: 7.574 — ABNORMAL HIGH (ref 7.350–7.450)
pO2, Arterial: 58 mmHg — ABNORMAL LOW (ref 83.0–108.0)

## 2019-04-15 LAB — PHOSPHORUS
Phosphorus: 2.6 mg/dL (ref 2.5–4.6)
Phosphorus: 1.8 mg/dL — ABNORMAL LOW (ref 2.5–4.6)

## 2019-04-15 LAB — GLUCOSE, CAPILLARY
Glucose-Capillary: 117 mg/dL — ABNORMAL HIGH (ref 70–99)
Glucose-Capillary: 125 mg/dL — ABNORMAL HIGH (ref 70–99)
Glucose-Capillary: 128 mg/dL — ABNORMAL HIGH (ref 70–99)
Glucose-Capillary: 115 mg/dL — ABNORMAL HIGH (ref 70–99)
Glucose-Capillary: 138 mg/dL — ABNORMAL HIGH (ref 70–99)

## 2019-04-15 LAB — BLOOD GAS, ARTERIAL
Acid-Base Excess: 7.4 mmol/L — ABNORMAL HIGH (ref 0.0–2.0)
Bicarbonate: 31.2 mmol/L — ABNORMAL HIGH (ref 20.0–28.0)
FIO2: 50
O2 Saturation: 98.5 %
Patient temperature: 37.4
pCO2 arterial: 44 mmHg (ref 32.0–48.0)
pH, Arterial: 7.467 — ABNORMAL HIGH (ref 7.350–7.450)
pO2, Arterial: 118 mmHg — ABNORMAL HIGH (ref 83.0–108.0)

## 2019-04-15 LAB — BASIC METABOLIC PANEL
Anion gap: 12 (ref 5–15)
BUN: 15 mg/dL (ref 8–23)
CO2: 28 mmol/L (ref 22–32)
Calcium: 9 mg/dL (ref 8.9–10.3)
Chloride: 96 mmol/L — ABNORMAL LOW (ref 98–111)
Creatinine, Ser: 0.92 mg/dL (ref 0.44–1.00)
GFR calc Af Amer: >60 mL/min (ref >60)
GFR calc non Af Amer: >60 mL/min (ref >60)
Glucose, Bld: 127 mg/dL — ABNORMAL HIGH (ref 70–99)
Potassium: 3.5 mmol/L (ref 3.5–5.1)
Sodium: 136 mmol/L (ref 135–145)

## 2019-04-15 LAB — MAGNESIUM
Magnesium: 1.6 mg/dL — ABNORMAL LOW (ref 1.7–2.4)
Magnesium: 2.3 mg/dL (ref 1.7–2.4)

## 2019-04-15 MED ORDER — DOCUSATE SODIUM 50 MG/5ML PO LIQD
200.0000 mg | Freq: Every day | ORAL | Status: DC
  Administered 2019-04-15 – 2019-04-18 (×4): 200 mg
  Filled 2019-04-15 (×4): qty 20

## 2019-04-15 MED ORDER — LIOTHYRONINE SODIUM 5 MCG PO TABS
5.0000 ug | ORAL_TABLET | Freq: Every day | ORAL | Status: DC
  Administered 2019-04-15 – 2019-04-18 (×4): 5 ug
  Filled 2019-04-15 (×5): qty 1

## 2019-04-15 MED ORDER — MAGNESIUM SULFATE 4 GM/100ML IV SOLN
4.0000 g | Freq: Once | INTRAVENOUS | Status: AC
  Administered 2019-04-15: 4 g via INTRAVENOUS
  Filled 2019-04-15: qty 100

## 2019-04-15 MED ORDER — VITAL HIGH PROTEIN PO LIQD
1000.0000 mL | ORAL | Status: DC
  Administered 2019-04-15 – 2019-04-16 (×2): 1000 mL

## 2019-04-15 MED ORDER — VITAL HIGH PROTEIN PO LIQD: 1000.0000 mL | ORAL | Status: DC

## 2019-04-15 MED ORDER — SPIRONOLACTONE 25 MG PO TABS
25.0000 mg | ORAL_TABLET | Freq: Every day | ORAL | Status: DC
  Administered 2019-04-15 – 2019-04-22 (×8): 25 mg via ORAL
  Filled 2019-04-15 (×8): qty 1

## 2019-04-15 MED ORDER — FLUOXETINE HCL 20 MG/5ML PO SOLN
20.0000 mg | Freq: Every day | ORAL | Status: DC
  Administered 2019-04-15: 20 mg
  Filled 2019-04-15 (×2): qty 5

## 2019-04-15 MED ORDER — POTASSIUM CHLORIDE 20 MEQ/15ML (10%) PO SOLN
40.0000 meq | Freq: Every day | ORAL | Status: DC
  Administered 2019-04-15 – 2019-04-16 (×2): 40 meq
  Filled 2019-04-15 (×2): qty 30

## 2019-04-15 MED ORDER — LEVOTHYROXINE SODIUM 25 MCG PO TABS
125.0000 ug | ORAL_TABLET | Freq: Every day | ORAL | Status: DC
  Administered 2019-04-16 – 2019-04-19 (×4): 125 ug
  Filled 2019-04-15 (×4): qty 1

## 2019-04-15 MED ORDER — POTASSIUM CHLORIDE 10 MEQ/50ML IV SOLN
10.0000 meq | INTRAVENOUS | Status: AC
  Administered 2019-04-15 (×3): 10 meq via INTRAVENOUS
  Filled 2019-04-15 (×3): qty 50

## 2019-04-15 MED ORDER — PRO-STAT SUGAR FREE PO LIQD: 30.0000 mL | Freq: Two times a day (BID) | ORAL | Status: DC

## 2019-04-15 MED ORDER — PRO-STAT SUGAR FREE PO LIQD
30.0000 mL | Freq: Two times a day (BID) | ORAL | Status: DC
  Administered 2019-04-15 – 2019-04-16 (×3): 30 mL
  Filled 2019-04-15 (×3): qty 30

## 2019-04-15 NOTE — Progress Notes (Signed)
eLink Physician-Brief Progress Note Patient Name: Rebecca Carr DOB: Sep 22, 1950 MRN: [id number]   Date of Service  04/15/2019  HPI/Events of Note  Notified of ABG result 7.57/33/58 on volume AC 20/510/40%/5 PEEP. Patient not breathing over the set rate  eICU Interventions  Decrease RR to 15 and increase FiO2 to 50%     Intervention Category Major Interventions: Respiratory failure - evaluation and management  Judd Lien 04/15/2019, 5:22 AM

## 2019-04-15 NOTE — Progress Notes (Signed)
Brief Nutrition Note  Consult received for enteral/tube feeding initiation and management.  Adult Enteral Nutrition Protocol initiated. Full assessment to follow.  Admitting Dx: Severe mitral regurgitation [I34.0] S/P mitral valve repair [Z98.890]   Labs:  Recent Labs  Lab 04/14/19 0259 04/14/19 0315 04/14/19 1610 04/15/19 0417 04/15/19 0424 04/15/19 0821  NA 137   < > 138 136 134*  --   K 4.3   < > 4.6 3.5 3.4*  --   CL 101  --  102 96*  --   --   CO2 27  --  28 28  --   --   BUN 16  --  19 15  --   --   CREATININE 0.90  --  0.91 0.92  --   --   CALCIUM 8.6*  --  8.7* 9.0  --   --   MG 2.2  --  2.2  --   --  1.6*  PHOS  --   --   --   --   --  2.6  GLUCOSE 165*  --  155* 127*  --   --    < > = values in this interval not displayed.    Corrin Parker, MS, RD, LDN RD pager number/after hours weekend pager number on Amion.

## 2019-04-15 NOTE — Progress Notes (Addendum)
NAME:  Rebecca Carr, MRN:  [id number], DOB:  02-Mar-1950, LOS: 5 ADMISSION DATE:  04/10/2019, CONSULTATION DATE: 3/6  REFERRING MD:  Darcey Nora, CHIEF COMPLAINT: Acute on chronic respiratory failure  Brief History   69 year old female with chronic respiratory failure and underlying history of severe mitral valve disease and pulmonary hypertension.  Underwent mitral valve repair on 3/5, extubated on 3/6 but developed progressive somnolence and hypercarbia therefore pulmonary asked to evaluate and assist with care.    Past Medical History   Severe mitral valve prolapse, chronic diastolic heart failure,Pulmonary hypertension Antiphospholipid syndrome Arthritis GERD Hashimoto's thyroiditis, hypothyroidism. COPD Chronic respiratory failure OSA  Significant Hospital Events   3/4 admitted 3/5 underwent mitral valve repair 3/6: Developed progressive hypercarbia post extubation pulmonary asked to evaluate.  Intubated. 3/7: Hemodynamically stable.  Pulmonary artery pressures much improved post intubation with PA systolics almost immediately dropping from 70s down to 27s Consults:  Thoracic surgery Advanced heart failure team Cardiology   Procedures:  Right IJ pulmonary artery catheter 3/5 Oral intubation 3/6  Significant Diagnostic Tests:    Micro Data:    Antimicrobials:    Interim history/subjective:  No acute issues overnight  Objective   Blood pressure 102/65, pulse 83, temperature 99.3 F (37.4 C), resp. rate 15, height 5\' 8"  (1.727 m), weight 106.3 kg, SpO2 98 %. PAP: (38-79)/(13-33) 41/17 CO:  [5.7 L/min] 5.7 L/min CI:  [2.7 L/min/m2] 2.7 L/min/m2  Vent Mode: PRVC FiO2 (%):  [40 %-100 %] 50 % Set Rate:  [4 bmp-20 bmp] 15 bmp Vt Set:  [510 mL] 510 mL PEEP:  [5 cmH20-6 cmH20] 5 cmH20 Pressure Support:  [10 cmH20] 10 cmH20 Plateau Pressure:  [24 cmH20-29 cmH20] 27 cmH20   Intake/Output Summary (Last 24 hours) at 04/15/2019 0725 Last data filed at 04/15/2019  0700 Gross per 24 hour  Intake 1814.5 ml  Output 4045 ml  Net -2230.5 ml   Filed Weights   04/13/19 0425 04/14/19 0500 04/15/19 0430  Weight: 96.3 kg 108.7 kg 106.3 kg    Examination:  General this is a chronically ill-appearing 69 year old female.  She is currently sedated but does appear older than stated age she remains on mechanical ventilator, hemodynamic support as well as inhaled nitric oxide on the ventilator HEENT normocephalic atraumatic no jugular venous distention is appreciated.  Right internal jugular vein PA catheter is in place the dressing is intact there is no evidence of erythema or drainage Pulmonary: Clear to auscultation, diminished bases.  No accessory use.  Pulse oximetry high 90s to 100%, currently 40% FiO2, 5 PEEP, excellent ventilatory mechanics with plateau pressure of 24 Cardiac: Regular rhythm, no murmur rub or gallop.  Sternotomy dressing is intact, sternotomy drains intact and draining serosanguineous output Neuro: Sedated on Precedex and fentanyl, no focal deficits appreciated Abdomen soft not tender bowel sounds are positive Extremities warm dry brisk capillary refill pulses palpable trace edema  Resolved Hospital Problem list     Assessment & Plan:    Acute on chronic hypercarbic respiratory failure Status post mitral valve repair Severe pulmonary hypertension History of restrictive lung disease Probable emphysema Pulmonary edema Acute diastolic heart  Cardiogenic shock Postoperative anemia without evidence of bleeding Mild leukocytosis: Suspect reactive  Acute on chronic hypercarbic respiratory failure, status post mitral valve repair.  Suspect mixed picture of decreased ventilatory effort post cardio-thoracic surgery, underlying restrictive lung disease +/-emphysema, and possibly residual sedation meds Portable chest x-ray  personally reviewed: Endotracheal tube in satisfactory position comparing film from day prior support  tubes in  satisfactory position, there is mild left greater than right nonspecific airspace disease otherwise no significant change, this likely represents an element of asymmetric pulmonary edema -Arterial blood gas reviewed, ventilator changes made -Pulmonary artery pressures have improved since added inhaled nitric oxide, prereintubation PAS as high as 70s, now on mechanical ventilation and inhaled and inhaled NO Continue inhaled nitric oxide for severe pulmonary hypertension,PAS running in low 40s, cardiac index remained stable Plan Continuing full ventilator support, plan is to rest on positive pressure through the weekend Continue volume removal with diuretics Hemodynamic support as directed by advanced heart failure team VAP bundle PAD protocol, RASS goal -1 Scheduled bronchodilators A.m. chest x-ray Follow-up ABG   Best practice:  Diet: NPO, will start tube feeds Pain/Anxiety/Delirium protocol (if indicated): 3/6 VAP protocol (if indicated): 3/6 DVT prophylaxis:   Per primary  GI prophylaxis: PPI Glucose control: ssi Mobility: bedrest Code Status: full code  Family Communication: per primary  Disposition: icu    Critical care time: 29 minutes    Erick Colace ACNP-BC [place] Pager # [phone] OR # [phone] if no answer  PCCM attending:  69 year old female intubatedFor hypercapnic respiratory failure patient has severe mitral valve disease and pulmonary hypertension patient underwent mitral valve repair on 04/13/2019 extubated on 04/14/2019 progressive hypercarbia and patient was reintubated on 04/14/2019.  BP (!) 90/57   Pulse 83   Temp 99.3 F (37.4 C)   Resp 19   Ht 5\' 8"  (1.727 m)   Wt 106.3 kg   SpO2 97%   BMI 35.63 kg/m   General: Elderly female intubated on mechanical life support. HEENT: NCAT, tracking appropriately Lungs: Bilateral mechanical ventilated breath sounds Heart: Regular rate rhythm S1-S2 Extremities: No significant  edema  Labs: Reviewed  Chest x-ray: Stable lines and tubes left perihilar opacities.,  Infiltrate.  No pneumothorax. The patient's images have been independently reviewed by me.    Assessment: Acute hypoxemic hypercarbic respiratory failure requiring intubation and mechanical ventilation Status post mitral valve repair Severe pulmonary hypertension History of restrictive lung disease Pulmonary edema from acute diastolic heart failure, valvular heart failure Cardiogenic shock, resolved  Plan: Remains on full mechanical support at this time Once able to be liberated from ventilator happy to assist. Would prefer stable hemodynamics and volume removal prior to this. We will coordinate with heart failure team as well as cardiothoracic surgery prior to considerations for mechanical vent liberation. Continue adult ventilator protocol PAD protocol for sedation Goal RASS -1 Continue scheduled bronchodilators Follow-up chest x-ray as needed.  Follow-up ABG as needed.  We appreciate the consultation.  We will continue to follow with you.  This patient is critically ill with multiple organ system failure; which, requires frequent high complexity decision making, assessment, support, evaluation, and titration of therapies. This was completed through the application of advanced monitoring technologies and extensive interpretation of multiple databases. During this encounter critical care time was devoted to patient care services described in this note for 32 minutes.  [facility] 04/15/2019 12:24 PM

## 2019-04-15 NOTE — Progress Notes (Signed)
[address]       [place] [address]             [phone]                 2 Days Post-Op Procedure(s) (LRB): MITRAL VALVE REPAIR (MVR) using Memo 4D 34 MM Mitral Valve. (N/A) TRANSESOPHAGEAL ECHOCARDIOGRAM (TEE) (N/A) Clipping Of Atrial Appendage using AtriCure 45 MM AtriClip. (N/A)   Events: Intubated yesterday due to hypercarbia.  Placed on inhaled nitric Stable overnight _______________________________________________________________ Vitals: BP 107/63   Pulse 83   Temp 99.3 F (37.4 C)   Resp 15   Ht 5\' 8"  (1.727 m)   Wt 106.3 kg   SpO2 96%   BMI 35.63 kg/m   - Neuro: alert NAd   - Cardiovascular: sinus  Drips: neo 40, milr 0.3, epi 1.  Lasix 6  iNO 15.   PAP: (38-79)/(13-33) 40/18 CO:  [5.7 L/min] 5.7 L/min CI:  [2.7 L/min/m2] 2.7 L/min/m2  - Pulm: coarse B Vent Mode: PRVC FiO2 (%):  [40 %-100 %] 40 % Set Rate:  [4 bmp-20 bmp] 15 bmp Vt Set:  [510 mL] 510 mL PEEP:  [5 cmH20-6 cmH20] 5 cmH20 Pressure Support:  [10 cmH20] 10 cmH20 Plateau Pressure:  [24 cmH20-29 cmH20] 24 cmH20  ABG    Component Value Date/Time   PHART 7.467 (H) 04/15/2019 0830   PCO2ART 44.0 04/15/2019 0830   PO2ART 118 (H) 04/15/2019 0830   HCO3 31.2 (H) 04/15/2019 0830   TCO2 32 04/15/2019 0424   O2SAT 98.5 04/15/2019 0830    - Abd: soft - Extremity: + edematous  .Intake/Output      03/06 0701 - 03/07 0700 03/07 0701 - 03/08 0700   I.V. (mL/kg) 1714.5 (16.1) 249.1 (2.3)   Blood     IV Piggyback 100 76.1   Total Intake(mL/kg) 1814.5 (17.1) 325.2 (3.1)   Urine (mL/kg/hr) 3315 (1.3) 610 (1.9)   Emesis/NG output  30   Blood     Chest Tube 730 0   Total Output 4045 640   Net -2230.5 -314.8           _______________________________________________________________ Labs: CBC Latest Ref Rng & Units 04/15/2019 04/15/2019 04/14/2019  WBC 4.0 - 10.5 K/uL - 16.6(H) 16.0(H)  Hemoglobin 12.0 - 15.0 g/dL 9.5(L) 8.9(L) 8.8(L)  Hematocrit 36.0 - 46.0 % 28.0(L)  27.6(L) 28.1(L)  Platelets 150 - 400 K/uL - 97(L) 96(L)   CMP Latest Ref Rng & Units 04/15/2019 04/15/2019 04/14/2019  Glucose 70 - 99 mg/dL - 127(H) 155(H)  BUN 8 - 23 mg/dL - 15 19  Creatinine 0.44 - 1.00 mg/dL - 0.92 0.91  Sodium 135 - 145 mmol/L 134(L) 136 138  Potassium 3.5 - 5.1 mmol/L 3.4(L) 3.5 4.6  Chloride 98 - 111 mmol/L - 96(L) 102  CO2 22 - 32 mmol/L - 28 28  Calcium 8.9 - 10.3 mg/dL - 9.0 8.7(L)  Total Protein 6.5 - 8.1 g/dL - - -  Total Bilirubin 0.3 - 1.2 mg/dL - - -  Alkaline Phos 38 - 126 U/L - - -  AST 15 - 41 U/L - - -  ALT 0 - 44 U/L - - -    CXR: PV congestion  _______________________________________________________________  Assessment and Plan: POD 2 s/p mini mitral repair  Neuro: alert NAD.  Wean sedation as tolerated CV: continue inotropic support.  On neo for low SVR.  CI good on milr.  On iNO with good  PA pressures.  Will wean iNO today.  Continue diuresis. Pulm: remains vented.  On iNO.  Continue diuresis.  Will attempt SBT today.  Gas much improved.  Will keep CT Renal: creat stable.  Diuresed well GI: starting trickle feeds today Heme: stable ID: afebrile, leukocytosis.  Will follow Endo: stable BG Dispo: continue ICU care  Melodie Bouillon, MD 04/15/2019 10:02 AM

## 2019-04-15 NOTE — Progress Notes (Signed)
Advanced Heart Failure Rounding Note  PCP-Cardiologist: No primary care provider on file.   Subjective:    S/p complex MV repair on 3/5   Reintubated yesterday for respiratory distress.  Started on inhaled nitric oxide.  Now 25 ppm.  On Lasix drip diuresing very well.  Remains on epi 1 and milrinone 0.3.  Lightly sedated on the vent.  Will arouse and follow commands.  Swan numbers checked personally CVP 10 PA 45/22 mean of 31 PCWP 17 Thermo 6.1/2.9 PVR 2.5WU SVR 751  Objective:   Weight Range: 106.3 kg Body mass index is 35.63 kg/m.   Vital Signs:   Temp:  [97.5 F (36.4 C)-99.5 F (37.5 C)] 99.1 F (37.3 C) (03/07 1000) Pulse Rate:  [80-106] 83 (03/07 0323) Resp:  [12-23] 15 (03/07 1000) BP: (96-130)/(48-77) 107/62 (03/07 1000) SpO2:  [91 %-100 %] 95 % (03/07 1000) Arterial Line BP: (97-131)/(49-64) 104/53 (03/07 1000) FiO2 (%):  [40 %-100 %] 40 % (03/07 0733) Weight:  [106.3 kg] 106.3 kg (03/07 0430) Last BM Date: 04/12/19  Weight change: Filed Weights   04/13/19 0425 04/14/19 0500 04/15/19 0430  Weight: 96.3 kg 108.7 kg 106.3 kg    Intake/Output:   Intake/Output Summary (Last 24 hours) at 04/15/2019 1059 Last data filed at 04/15/2019 1000 Gross per 24 hour  Intake 1977.06 ml  Output 4425 ml  Net -2447.94 ml      Physical Exam    PHYSICAL EXAM: General: awake on vent following commands HEENT: normal + ETT Neck: supple.  Right IJ Swan carotids 2+ bilat; no bruits. No lymphadenopathy or thryomegaly appreciated. Cor: PMI nondisplaced. Regular rate & rhythm. No rubs, gallops or murmurs.  Sternal dressing okay. Lungs: Coarse Abdomen: Obese  soft, nontender, nondistended. No hepatosplenomegaly. No bruits or masses. Good bowel sounds. Extremities: no cyanosis, clubbing, rash, 1+ edema Neuro: Awake on vent following commands  Telemetry   A paced 80 personally reviewed  Labs    CBC Recent Labs    04/14/19 1610 04/14/19 1610 04/15/19 0417  04/15/19 0424  WBC 16.0*  --  16.6*  --   HGB 8.8*   < > 8.9* 9.5*  HCT 28.1*   < > 27.6* 28.0*  MCV 94.0  --  90.8  --   PLT 96*  --  97*  --    < > = values in this interval not displayed.   Basic Metabolic Panel Recent Labs    04/14/19 1610 04/14/19 1610 04/15/19 0417 04/15/19 0424 04/15/19 0821  NA 138   < > 136 134*  --   K 4.6   < > 3.5 3.4*  --   CL 102  --  96*  --   --   CO2 28  --  28  --   --   GLUCOSE 155*  --  127*  --   --   BUN 19  --  15  --   --   CREATININE 0.91  --  0.92  --   --   CALCIUM 8.7*  --  9.0  --   --   MG 2.2  --   --   --  1.6*  PHOS  --   --   --   --  2.6   < > = values in this interval not displayed.   Liver Function Tests No results for input(s): AST, ALT, ALKPHOS, BILITOT, PROT, ALBUMIN in the last 72 hours. No results for input(s): LIPASE, AMYLASE in the  last 72 hours. Cardiac Enzymes No results for input(s): CKTOTAL, CKMB, CKMBINDEX, TROPONINI in the last 72 hours.  BNP: BNP (last 3 results) No results for input(s): BNP in the last 8760 hours.  ProBNP (last 3 results) No results for input(s): PROBNP in the last 8760 hours.   D-Dimer No results for input(s): DDIMER in the last 72 hours. Hemoglobin A1C No results for input(s): HGBA1C in the last 72 hours. Fasting Lipid Panel No results for input(s): CHOL, HDL, LDLCALC, TRIG, CHOLHDL, LDLDIRECT in the last 72 hours. Thyroid Function Tests No results for input(s): TSH, T4TOTAL, T3FREE, THYROIDAB in the last 72 hours.  Invalid input(s): FREET3  Other results:   Imaging    DG Chest 1 View  Result Date: 04/14/2019 CLINICAL DATA:  Ventilator dependent respiratory failure. Postop day 1 mitral valve repair and LEFT atrial appendage clipping. EXAM: Portable CHEST 1 VIEW 4:39 p.m.: COMPARISON:  Portable chest x-ray earlier same day at 6:25 a.m. and previously. FINDINGS: Endotracheal tube tip in satisfactory position projecting approximately 5 cm above the carina. Swan-Ganz  catheter tip projects at the expected location of the proximal RIGHT main pulmonary artery. BILATERAL chest tubes without evidence of pneumothorax. Mediastinal drainage tube. Mitral valve annuloplasty. LEFT atrial appendage clip. Airspace opacities in the LEFT UPPER LOBE, unchanged since earlier in the day. Dense atelectasis in the LEFT LOWER LOBE silhouetting the LEFT hemidiaphragm, unchanged. No new pulmonary parenchymal abnormalities. IMPRESSION: 1. Support apparatus satisfactory. 2. Stable atelectasis involving the LEFT LOWER LOBE. Possible developing pneumonia in the LEFT UPPER LOBE, stable since earlier same day. 3. No new abnormalities. Electronically Signed   By: Evangeline Dakin M.D.   On: 04/14/2019 16:51   Portable Chest xray  Result Date: 04/15/2019 CLINICAL DATA:  Post mitral valve repair EXAM: PORTABLE CHEST 1 VIEW COMPARISON:  04/14/2019 FINDINGS: Endotracheal tube, enteric tube, right IJ Swan-Ganz catheter, bilateral chest tubes, and mediastinal drain are again noted. No pneumothorax. Persistent left perihilar and retrocardiac opacities. No pleural effusion. Stable cardiomediastinal contours. IMPRESSION: Stable lines and tubes. Stable lung aeration with persistent left perihilar and basilar opacities. No pneumothorax. Electronically Signed   By: Macy Mis M.D.   On: 04/15/2019 06:35   DG Abd Portable 1V  Result Date: 04/15/2019 CLINICAL DATA:  69 year old female with history of orogastric tube placement. EXAM: PORTABLE ABDOMEN - 1 VIEW COMPARISON:  No priors. FINDINGS: Enteric tube with tip terminating in the antrum of the stomach. Swan-Ganz catheter with tip in the right main pulmonary artery. Midline surgical drains which likely reflect mediastinal/pericardial drains. Bilateral chest tubes. Epicardial pacing wires. Paucity of bowel gas. IMPRESSION: 1. Tip of enteric tube is in the antrum of the stomach. Electronically Signed   By: Vinnie Langton M.D.   On: 04/15/2019 07:42      Medications:     Scheduled Medications: . acetaminophen  1,000 mg Oral Q6H   Or  . acetaminophen (TYLENOL) oral liquid 160 mg/5 mL  1,000 mg Per Tube Q6H  . aspirin  324 mg Per Tube Daily  . bisacodyl  10 mg Rectal Daily  . chlorhexidine gluconate (MEDLINE KIT)  15 mL Mouth Rinse BID  . Chlorhexidine Gluconate Cloth  6 each Topical Daily  . docusate  200 mg Per Tube Daily  . feeding supplement (PRO-STAT SUGAR FREE 64)  30 mL Per Tube BID  . feeding supplement (VITAL HIGH PROTEIN)  1,000 mL Per Tube Q24H  . FLUoxetine  20 mg Per Tube Daily  . insulin aspart  0-24  Units Subcutaneous Q4H  . levalbuterol  1.25 mg Nebulization Q6H  . [START ON 04/16/2019] levothyroxine  125 mcg Per Tube QAC breakfast  . liothyronine  5 mcg Per Tube Daily  . mouth rinse  15 mL Mouth Rinse 10 times per day  . mometasone-formoterol  2 puff Inhalation BID  . pantoprazole (PROTONIX) IV  40 mg Intravenous QHS  . potassium chloride  40 mEq Per Tube Daily  . sodium chloride flush  10-40 mL Intracatheter Q12H  . sodium chloride flush  3 mL Intravenous Q12H    Infusions: . sodium chloride 20 mL/hr at 04/13/19 1500  . sodium chloride    . sodium chloride 10 mL/hr at 04/13/19 1450  . dexmedetomidine (PRECEDEX) IV infusion 0.8 mcg/kg/hr (04/15/19 1000)  . epinephrine 1 mcg/min (04/15/19 1000)  . ertapenem Stopped (04/14/19 2229)  . fentaNYL infusion INTRAVENOUS 100 mcg/hr (04/15/19 1000)  . furosemide (LASIX) infusion 6 mg/hr (04/15/19 1000)  . lactated ringers 100 mL/hr at 04/13/19 1929  . milrinone 0.3 mcg/kg/min (04/15/19 1000)  . phenylephrine (NEO-SYNEPHRINE) Adult infusion 45 mcg/min (04/15/19 1000)  . potassium chloride 10 mEq (04/15/19 1054)    PRN Medications: sodium chloride, fentaNYL, metoprolol tartrate, midazolam, morphine injection, ondansetron (ZOFRAN) IV, oxyCODONE, sodium chloride flush, sodium chloride flush, traMADol   Assessment/Plan    1. Mitral Regurgitation, severe - ECHO/TEE  with severe MR P2 prolapse - s/p complex MV repair 3/5 - Doing well hemodynamically on milrinone 0.3 and low-dose epinephrine - Left-sided and PA pressures down significantly.  However weight still up significantly from preop -Continue diuresis  2. A/C Diastolic Heart Failure/pulmonary HTN - ECHO 03/2019 EF 60-65% grade II DD  - s/p MVR.  -Pulmonary hypertension mostly pulmonary venous hypertension improving with diuresis.  She is also on inhaled nitric.  Will wean down the inhaled nitric slowly today.  Continue aggressive diuresis.  3. Acute postop hypoxic respiratory failure - Remains on vent.  PA pressures down. -Mechanics checked this morning with RT remains suboptimal.  We will continue diuresis.  Leave intubated today.  Discussed with Dr. Kipp Brood on rounds.  4. Acute blood loss anemia - transfuse as needed. Keep hgb > 8.3  5. H/o lupus and Sjogren's disease - followed at [facility] - her neck/ jaw pain in the area of the Stensen's duct may possibly be related to Sjogren's syndrome and salivary gland involvement.  - CT unremarkable.  -We will give 3 days of IV antibiotics and stop.  Discussed with pharmacy.  6.  Hypokalemia/hypomagnesemia. -Supplement. -Start spironolactone.  CRITICAL CARE Performed by: Glori Bickers  Total critical care time: 35 minutes  Critical care time was exclusive of separately billable procedures and treating other patients.  Critical care was necessary to treat or prevent imminent or life-threatening deterioration.  Critical care was time spent personally by me (independent of midlevel providers or residents) on the following activities: development of treatment plan with patient and/or surrogate as well as nursing, discussions with consultants, evaluation of patient's response to treatment, examination of patient, obtaining history from patient or surrogate, ordering and performing treatments and interventions, ordering and review of  laboratory studies, ordering and review of radiographic studies, pulse oximetry and re-evaluation of patient's condition.      Length of Stay: [place], MD  04/15/2019, 10:59 AM  Advanced Heart Failure Team Pager [phone] (M-F; [place])  Please contact [facility] for night-coverage after hours (4p -7a ) and weekends on [email or web address]

## 2019-04-16 ENCOUNTER — Encounter: Payer: Self-pay | Admitting: *Deleted

## 2019-04-16 ENCOUNTER — Inpatient Hospital Stay (HOSPITAL_COMMUNITY): Payer: Medicare Other

## 2019-04-16 DIAGNOSIS — Z952 Presence of prosthetic heart valve: Secondary | ICD-10-CM

## 2019-04-16 DIAGNOSIS — D649 Anemia, unspecified: Secondary | ICD-10-CM

## 2019-04-16 DIAGNOSIS — R570 Cardiogenic shock: Secondary | ICD-10-CM

## 2019-04-16 DIAGNOSIS — I472 Ventricular tachycardia: Secondary | ICD-10-CM

## 2019-04-16 DIAGNOSIS — R739 Hyperglycemia, unspecified: Secondary | ICD-10-CM

## 2019-04-16 LAB — TYPE AND SCREEN
ABO/RH(D): O POS
Antibody Screen: NEGATIVE
Unit division: 0
Unit division: 0

## 2019-04-16 LAB — POCT I-STAT 7, (LYTES, BLD GAS, ICA,H+H)
Acid-Base Excess: 10 mmol/L — ABNORMAL HIGH (ref 0.0–2.0)
Acid-Base Excess: 6 mmol/L — ABNORMAL HIGH (ref 0.0–2.0)
Bicarbonate: 34.3 mmol/L — ABNORMAL HIGH (ref 20.0–28.0)
Bicarbonate: 31.5 mmol/L — ABNORMAL HIGH (ref 20.0–28.0)
Calcium, Ion: 1.16 mmol/L (ref 1.15–1.40)
Calcium, Ion: 1.16 mmol/L (ref 1.15–1.40)
HCT: 26 % — ABNORMAL LOW (ref 36.0–46.0)
HCT: 27 % — ABNORMAL LOW (ref 36.0–46.0)
Hemoglobin: 8.8 g/dL — ABNORMAL LOW (ref 12.0–15.0)
Hemoglobin: 9.2 g/dL — ABNORMAL LOW (ref 12.0–15.0)
O2 Saturation: 97 %
O2 Saturation: 87 %
Patient temperature: 37.4
Patient temperature: 37.2
Potassium: 3.3 mmol/L — ABNORMAL LOW (ref 3.5–5.1)
Potassium: 3.6 mmol/L (ref 3.5–5.1)
Sodium: 133 mmol/L — ABNORMAL LOW (ref 135–145)
Sodium: 135 mmol/L (ref 135–145)
TCO2: 36 mmol/L — ABNORMAL HIGH (ref 22–32)
TCO2: 33 mmol/L — ABNORMAL HIGH (ref 22–32)
pCO2 arterial: 47.1 mmHg (ref 32.0–48.0)
pCO2 arterial: 47.9 mmHg (ref 32.0–48.0)
pH, Arterial: 7.471 — ABNORMAL HIGH (ref 7.350–7.450)
pH, Arterial: 7.427 (ref 7.350–7.450)
pO2, Arterial: 93 mmHg (ref 83.0–108.0)
pO2, Arterial: 53 mmHg — ABNORMAL LOW (ref 83.0–108.0)

## 2019-04-16 LAB — MAGNESIUM
Magnesium: 1.9 mg/dL (ref 1.7–2.4)
Magnesium: 2 mg/dL (ref 1.7–2.4)

## 2019-04-16 LAB — BASIC METABOLIC PANEL
Anion gap: 10 (ref 5–15)
Anion gap: 10 (ref 5–15)
BUN: 18 mg/dL (ref 8–23)
BUN: 22 mg/dL (ref 8–23)
CO2: 29 mmol/L (ref 22–32)
CO2: 28 mmol/L (ref 22–32)
Calcium: 8.4 mg/dL — ABNORMAL LOW (ref 8.9–10.3)
Calcium: 8.4 mg/dL — ABNORMAL LOW (ref 8.9–10.3)
Chloride: 95 mmol/L — ABNORMAL LOW (ref 98–111)
Chloride: 95 mmol/L — ABNORMAL LOW (ref 98–111)
Creatinine, Ser: 0.89 mg/dL (ref 0.44–1.00)
Creatinine, Ser: 0.99 mg/dL (ref 0.44–1.00)
GFR calc Af Amer: >60 mL/min (ref >60)
GFR calc Af Amer: >60 mL/min (ref >60)
GFR calc non Af Amer: >60 mL/min (ref >60)
GFR calc non Af Amer: 59 mL/min — ABNORMAL LOW (ref >60)
Glucose, Bld: 134 mg/dL — ABNORMAL HIGH (ref 70–99)
Glucose, Bld: 164 mg/dL — ABNORMAL HIGH (ref 70–99)
Potassium: 3.5 mmol/L (ref 3.5–5.1)
Potassium: 5.2 mmol/L — ABNORMAL HIGH (ref 3.5–5.1)
Sodium: 134 mmol/L — ABNORMAL LOW (ref 135–145)
Sodium: 133 mmol/L — ABNORMAL LOW (ref 135–145)

## 2019-04-16 LAB — GLUCOSE, CAPILLARY
Glucose-Capillary: 122 mg/dL — ABNORMAL HIGH (ref 70–99)
Glucose-Capillary: 122 mg/dL — ABNORMAL HIGH (ref 70–99)
Glucose-Capillary: 139 mg/dL — ABNORMAL HIGH (ref 70–99)
Glucose-Capillary: 140 mg/dL — ABNORMAL HIGH (ref 70–99)
Glucose-Capillary: 141 mg/dL — ABNORMAL HIGH (ref 70–99)
Glucose-Capillary: 141 mg/dL — ABNORMAL HIGH (ref 70–99)
Glucose-Capillary: 151 mg/dL — ABNORMAL HIGH (ref 70–99)

## 2019-04-16 LAB — CBC
HCT: 27 % — ABNORMAL LOW (ref 36.0–46.0)
Hemoglobin: 8.7 g/dL — ABNORMAL LOW (ref 12.0–15.0)
MCH: 29.7 pg (ref 26.0–34.0)
MCHC: 32.2 g/dL (ref 30.0–36.0)
MCV: 92.2 fL (ref 80.0–100.0)
Platelets: 85 10*3/uL — ABNORMAL LOW (ref 150–400)
RBC: 2.93 MIL/uL — ABNORMAL LOW (ref 3.87–5.11)
RDW: 13.6 % (ref 11.5–15.5)
WBC: 15.9 10*3/uL — ABNORMAL HIGH (ref 4.0–10.5)
nRBC: 0 % (ref 0.0–0.2)

## 2019-04-16 LAB — BPAM RBC
Blood Product Expiration Date: 202104042359
Blood Product Expiration Date: 202104042359
ISSUE DATE / TIME: 202103051341
ISSUE DATE / TIME: 202103052132
Unit Type and Rh: 5100
Unit Type and Rh: 5100

## 2019-04-16 LAB — SURGICAL PATHOLOGY

## 2019-04-16 LAB — PHOSPHORUS
Phosphorus: 3 mg/dL (ref 2.5–4.6)
Phosphorus: 3.9 mg/dL (ref 2.5–4.6)

## 2019-04-16 LAB — TSH: TSH: 5.506 u[IU]/mL — ABNORMAL HIGH (ref 0.350–4.500)

## 2019-04-16 LAB — T4, FREE: Free T4: 0.73 ng/dL (ref 0.61–1.12)

## 2019-04-16 MED ORDER — PRO-STAT SUGAR FREE PO LIQD
30.0000 mL | Freq: Three times a day (TID) | ORAL | Status: DC
  Administered 2019-04-16 – 2019-04-18 (×6): 30 mL
  Filled 2019-04-16 (×6): qty 30

## 2019-04-16 MED ORDER — VITAL AF 1.2 CAL PO LIQD
1000.0000 mL | ORAL | Status: DC
  Administered 2019-04-17: 1000 mL

## 2019-04-16 MED ORDER — VANCOMYCIN HCL 2000 MG/400ML IV SOLN
2000.0000 mg | Freq: Once | INTRAVENOUS | Status: AC
  Administered 2019-04-16: 2000 mg via INTRAVENOUS
  Filled 2019-04-16: qty 400

## 2019-04-16 MED ORDER — AMIODARONE HCL IN DEXTROSE 360-4.14 MG/200ML-% IV SOLN
30.0000 mg/h | INTRAVENOUS | Status: DC
  Administered 2019-04-17 – 2019-04-19 (×8): 30 mg/h via INTRAVENOUS
  Filled 2019-04-16 (×8): qty 200

## 2019-04-16 MED ORDER — LEVALBUTEROL HCL 1.25 MG/0.5ML IN NEBU: 1.2500 mg | INHALATION_SOLUTION | Freq: Four times a day (QID) | RESPIRATORY_TRACT | Status: DC

## 2019-04-16 MED ORDER — LEVALBUTEROL HCL 1.25 MG/0.5ML IN NEBU
1.2500 mg | INHALATION_SOLUTION | Freq: Four times a day (QID) | RESPIRATORY_TRACT | Status: DC
  Administered 2019-04-16 – 2019-04-17 (×4): 1.25 mg via RESPIRATORY_TRACT
  Filled 2019-04-16 (×5): qty 0.5

## 2019-04-16 MED ORDER — NOREPINEPHRINE 16 MG/250ML-% IV SOLN
0.0000 ug/min | INTRAVENOUS | Status: DC
  Administered 2019-04-16: 10 ug/min via INTRAVENOUS
  Administered 2019-04-17: 6 ug/min via INTRAVENOUS
  Filled 2019-04-16 (×3): qty 250

## 2019-04-16 MED ORDER — MILRINONE LACTATE IN DEXTROSE 20-5 MG/100ML-% IV SOLN
0.2000 ug/kg/min | INTRAVENOUS | Status: DC
  Administered 2019-04-17 – 2019-04-18 (×3): 0.2 ug/kg/min via INTRAVENOUS
  Filled 2019-04-16 (×3): qty 100

## 2019-04-16 MED ORDER — SODIUM CHLORIDE 0.9 % IV SOLN
2.0000 g | Freq: Three times a day (TID) | INTRAVENOUS | Status: DC
  Administered 2019-04-16 – 2019-04-20 (×12): 2 g via INTRAVENOUS
  Filled 2019-04-16 (×13): qty 2

## 2019-04-16 MED ORDER — AMIODARONE HCL IN DEXTROSE 360-4.14 MG/200ML-% IV SOLN
60.0000 mg/h | INTRAVENOUS | Status: DC
  Administered 2019-04-16: 60 mg/h via INTRAVENOUS
  Filled 2019-04-16: qty 200

## 2019-04-16 MED ORDER — IPRATROPIUM BROMIDE 0.02 % IN SOLN
0.5000 mg | Freq: Four times a day (QID) | RESPIRATORY_TRACT | Status: DC
  Administered 2019-04-16 – 2019-04-17 (×4): 0.5 mg via RESPIRATORY_TRACT
  Filled 2019-04-16 (×4): qty 2.5

## 2019-04-16 MED ORDER — IPRATROPIUM-ALBUTEROL 0.5-2.5 (3) MG/3ML IN SOLN: 3.0000 mL | Freq: Four times a day (QID) | RESPIRATORY_TRACT | Status: DC

## 2019-04-16 MED ORDER — CHLORHEXIDINE GLUCONATE 0.12 % MT SOLN
OROMUCOSAL | Status: AC
  Filled 2019-04-16: qty 15

## 2019-04-16 MED ORDER — METOLAZONE 5 MG PO TABS
5.0000 mg | ORAL_TABLET | Freq: Once | ORAL | Status: AC
  Administered 2019-04-16: 5 mg
  Filled 2019-04-16: qty 1

## 2019-04-16 MED ORDER — LIDOCAINE HCL (CARDIAC) PF 100 MG/5ML IV SOSY
100.0000 mg | PREFILLED_SYRINGE | Freq: Once | INTRAVENOUS | Status: AC
  Administered 2019-04-16: 100 mg via INTRAVENOUS

## 2019-04-16 MED ORDER — BISACODYL 10 MG RE SUPP
10.0000 mg | Freq: Every day | RECTAL | Status: DC | PRN
  Administered 2019-04-25: 10 mg via RECTAL
  Filled 2019-04-16: qty 1

## 2019-04-16 MED ORDER — AMIODARONE LOAD VIA INFUSION
150.0000 mg | Freq: Once | INTRAVENOUS | Status: AC
  Administered 2019-04-16: 150 mg via INTRAVENOUS
  Filled 2019-04-16: qty 83.34

## 2019-04-16 MED ORDER — POTASSIUM CHLORIDE 20 MEQ/15ML (10%) PO SOLN
20.0000 meq | ORAL | Status: AC
  Administered 2019-04-16 (×3): 20 meq
  Filled 2019-04-16 (×3): qty 15

## 2019-04-16 MED ORDER — AMIODARONE HCL IN DEXTROSE 360-4.14 MG/200ML-% IV SOLN
INTRAVENOUS | Status: AC
  Administered 2019-04-16: 60 mg/h via INTRAVENOUS
  Filled 2019-04-16: qty 200

## 2019-04-16 MED ORDER — VANCOMYCIN HCL 1500 MG/300ML IV SOLN
1500.0000 mg | INTRAVENOUS | Status: DC
  Administered 2019-04-16: 1500 mg via INTRAVENOUS
  Filled 2019-04-16: qty 300

## 2019-04-16 MED ORDER — POTASSIUM CHLORIDE 10 MEQ/50ML IV SOLN
10.0000 meq | INTRAVENOUS | Status: AC
  Administered 2019-04-16 (×3): 10 meq via INTRAVENOUS
  Filled 2019-04-16 (×3): qty 50

## 2019-04-16 NOTE — Progress Notes (Signed)
RT note- Called to room for sp02 83-85%, ETT confirmed no change, BBS equal but decreased on left, pulse-ox with good wave form. ABG drawn on 40%,  Dr. Roxy Manns called. Fio2 increased to 100%. Will titrate fio2 as tolerated.

## 2019-04-16 NOTE — Progress Notes (Signed)
RT note- Nitric turned off at this time per Dr. Roxy Manns and remains on standby

## 2019-04-16 NOTE — Progress Notes (Signed)
Late Entry - Chain of events  1045 - Sats 83-85%, RT notified. Pt suctioned; no secretions noted. Good waveform noted on monitor. Lung sounds more decreased on left. Two pleural tubes still present. ABG obtained. Dr. Roxy Manns notified of the above. Stated to obtain another PCXR.  1145 - Patient noted to be in Afib RVR with HR 120-140's. BP remaining stable. Dr. Haroldine Laws notified. New orders received for Amio bolus and then Amio infusion per protocol. Pt's BP dropped after Amio bolus which required an increase in levo drip.  1230 - Patient HR elevated to 130's again with a wide complex arrhythmia. Patient's rhythm alternating between afib rvr and vtach. Patient's a-line remained pulsatile throughout. Dr. Ardyth Man. Roxy Manns notified. Dr. Darcey Nora next door and came to evaluate patient - orders received for lidocaine bristojet.   1445 - Patient continues to have episodes of elevated HR 120's. Dr. Haroldine Laws at bedside. Orders received to decrease milrinone. Will likely give another amio bolus per MD.   Will continue to monitor closely.

## 2019-04-16 NOTE — Progress Notes (Signed)
Gastric residual checked per Dr. Roxy Manns request. 150 mls noted. Re-fed back into patient. Tolerated well.  Joellen Jersey, RN

## 2019-04-16 NOTE — Progress Notes (Signed)
Initial Nutrition Assessment  DOCUMENTATION CODES:   Not applicable  INTERVENTION:    Finish liter bottle of Vital High Protein today  Tomorrow start tube feeding:  -Vital AF 1.2 @ 50 ml/hr via OGT (1200 ml)  -30 ml Prostat TID  Provides: 1740 kcals, 135 grams protein, 973 ml free water.   NUTRITION DIAGNOSIS:   Increased nutrient needs related to post-op healing as evidenced by estimated needs.  GOAL:   Patient will meet greater than or equal to 90% of their needs  MONITOR:   Diet advancement, Vent status, Skin, TF tolerance, Weight trends, Labs, I & O's  REASON FOR ASSESSMENT:   Consult Enteral/tube feeding initiation and management  ASSESSMENT:   Patient with PMH significant for severe mitral valve prolapse, CHF, pulmonary HTN, antiphospholipid syndrome, COPD, and OSA. Presents this admission for surgery.   3/5- s/p MVR 3/6- failed extubation, re-intubated   Remains on pressors. Continues to diurese. Vital High Protein started as 40 ml/hr + 30 Prostat yesterday per TCTS. Per RN, new bottle started around noon today. Will plan for change in formula tomorrow.   Bowel sounds minimal but abdomen soft. Awaiting BM. RN checking residuals per MD request. Per ASPEN guidelines, GRVs are not indicated to monitor ICU pts receiving EN. If they are to be utilized, holding EN for GRVs <500 ml in the absence of other signs of intolerance should be avoided.  Admission weight: 102.3 kg  Current weight: 107.5 kg   Patient is currently intubated on ventilator support MV: 7.8 L/min Temp (24hrs), Avg:99.6 F (37.6 C), Min:98.6 F (37 C), Max:100 F (37.8 C)   I/O: -3,544 ml since admit  UOP: 3,400 ml x 24 hrs  Chest tubes: 330 ml x 24 hrs   Drips: precedex, epinephrine, 250 mg lasix in D5 @ 10 ml/hr, levophed Medications: dulcolax, colace, SS novolog, 20 mEq KCl daily, aldactone,  Labs: CBG 122-140  NUTRITION - FOCUSED PHYSICAL EXAM:    Most Recent Value  Orbital  Region  No depletion  Upper Arm Region  No depletion  Thoracic and Lumbar Region  Unable to assess  Buccal Region  No depletion  Temple Region  No depletion  Clavicle Bone Region  No depletion  Clavicle and Acromion Bone Region  No depletion  Scapular Bone Region  Unable to assess  Dorsal Hand  No depletion  Patellar Region  No depletion  Anterior Thigh Region  No depletion  Posterior Calf Region  No depletion  Edema (RD Assessment)  Mild  Hair  Reviewed  Eyes  Unable to assess  Mouth  Unable to assess  Skin  Reviewed  Nails  Reviewed     Diet Order:   Diet Order            Diet NPO time specified  Diet effective now              EDUCATION NEEDS:   Not appropriate for education at this time  Skin:  Skin Assessment: Skin Integrity Issues: Skin Integrity Issues:: Incisions Incisions: sternum  Last BM:  3/4  Height:   Ht Readings from Last 1 Encounters:  04/11/19 5\' 8"  (1.727 m)    Weight:   Wt Readings from Last 1 Encounters:  04/16/19 107.5 kg    Ideal Body Weight:  63.6 kg  BMI:  Body mass index is 36.03 kg/m.  Estimated Nutritional Needs:   Kcal:  1727 kcal  Protein:  125-140 grams  Fluid:  >/= 1.7 L/day   Dezarai Prew  Oswaldo Milian RD, LDN Clinical Nutrition Pager listed in [place]

## 2019-04-16 NOTE — Progress Notes (Signed)
Pharmacy Antibiotic Note  Rebecca Carr is a 69 y.o. female admitted on 04/10/2019 with pneumonia.  Pharmacy has been consulted for vancomycin and cefepime dosing.  Plan: Vancomycin 2g IV x 1, then 1500 mg q 24 hr. Calculated AUC 506 (Scr 0.89) Cefepime 2g IV q 8 hrs. F/u renal function, clinical course, vancomycin levels as needed.  Height: 5\' 8"  (172.7 cm) Weight: 236 lb 15.9 oz (107.5 kg) IBW/kg (Calculated) : 63.9  Temp (24hrs), Avg:99.6 F (37.6 C), Min:98.6 F (37 C), Max:100 F (37.8 C)  Recent Labs  Lab 04/13/19 1315 04/13/19 1453 04/13/19 2000 04/14/19 0259 04/14/19 1610 04/15/19 0417 04/16/19 0431  WBC  --    < > 13.0* 11.9* 16.0* 16.6* 15.9*  CREATININE 0.90  --   --  0.90 0.91 0.92 0.89   < > = values in this interval not displayed.    Estimated Creatinine Clearance: 77.6 mL/min (by C-G formula based on SCr of 0.89 mg/dL).    Allergies  Allergen Reactions  . Iodine Anaphylaxis  . Iohexol Anaphylaxis     Desc: Patient states she is allergic to iodine and had a "code blue" incident after an injection for some type of imaging study.   . Penicillins Rash    PCN IN LARGE DOSES. Did it involve swelling of the face/tongue/throat, SOB, or low BP? No Did it involve sudden or severe rash/hives, skin peeling, or any reaction on the inside of your mouth or nose? No Did you need to seek medical attention at a hospital or doctor's office? No When did it last happen? More than 10 years ago If all above answers are "NO", may proceed with cephalosporin use.     Antimicrobials this admission:  Ertapenem 3/4 > 3/5 Vancomycin 3/8 >  Cefepime 3/8 >   Dose adjustments this admission:   Microbiology results:  3/8 BCx x 2:  3/8 Resp Cx:  3/4 MRSA PCR: neg  Marguerite Olea, [facility] Clinical Pharmacist Phone [phone]  04/16/2019 4:10 PM

## 2019-04-16 NOTE — Addendum Note (Signed)
Addendum  created 04/16/19 0754 by Josephine Igo, CRNA   Order list changed

## 2019-04-16 NOTE — Progress Notes (Addendum)
[address]       [place] [address]             [phone]        CARDIOTHORACIC SURGERY PROGRESS NOTE   R3 Days Post-Op Procedure(s) (LRB): MITRAL VALVE REPAIR (MVR) using Memo 4D 34 MM Mitral Valve. (N/A) TRANSESOPHAGEAL ECHOCARDIOGRAM (TEE) (N/A) Clipping Of Atrial Appendage using AtriCure 45 MM AtriClip. (N/A)  Subjective: Awake on vent.  Follows commands.  Objective: Vital signs: BP Readings from Last 1 Encounters:  04/16/19 91/66   Pulse Readings from Last 1 Encounters:  04/15/19 91   Resp Readings from Last 1 Encounters:  04/16/19 15   Temp Readings from Last 1 Encounters:  04/16/19 99.7 F (37.6 C)    Hemodynamics: PAP: (37-56)/(17-32) 47/25 CO:  [5.2 L/min-5.8 L/min] 5.2 L/min CI:  [2.5 L/min/m2-2.8 L/min/m2] 2.5 L/min/m2  Physical Exam:  Rhythm:   sinus  Breath sounds: Clear, symmetrical  Heart sounds:  RRR w/out murmur  Incisions:  Dressing dry, intact  Abdomen:  Soft, non-distended  Extremities:  Warm, well-perfused  Chest tubes:  low volume thin serosanguinous output, no air leak    Intake/Output from previous day: 03/07 0701 - 03/08 0700 In: 4797.6 [I.V.:3595.8; NG/GT:830; IV Piggyback:371.9] Out: 4130 [Urine:3400; Emesis/NG output:400; Chest Tube:330] Intake/Output this shift: No intake/output data recorded.  Lab Results:  CBC: Recent Labs    04/15/19 0417 04/15/19 0424 04/16/19 0431 04/16/19 0438  WBC 16.6*  --  15.9*  --   HGB 8.9*   < > 8.7* 8.8*  HCT 27.6*   < > 27.0* 26.0*  PLT 97*  --  85*  --    < > = values in this interval not displayed.    BMET:  Recent Labs    04/15/19 0417 04/15/19 0424 04/16/19 0431 04/16/19 0438  NA 136   < > 134* 133*  K 3.5   < > 3.5 3.3*  CL 96*  --  95*  --   CO2 28  --  29  --   GLUCOSE 127*  --  134*  --   BUN 15  --  18  --   CREATININE 0.92  --  0.89  --   CALCIUM 9.0  --  8.4*  --    < > = values in this interval not displayed.     PT/INR:     Recent Labs    04/13/19 1453  LABPROT 18.0*  INR 1.5*    CBG (last 3)  Recent Labs    04/15/19 2032 04/16/19 0015 04/16/19 0400  GLUCAP 138* 139* 122*    ABG    Component Value Date/Time   PHART 7.471 (H) 04/16/2019 0438   PCO2ART 47.1 04/16/2019 0438   PO2ART 93.0 04/16/2019 0438   HCO3 34.3 (H) 04/16/2019 0438   TCO2 36 (H) 04/16/2019 0438   O2SAT 97.0 04/16/2019 0438    CXR: PORTABLE CHEST 1 VIEW  COMPARISON:  04/15/2019 and older studies  FINDINGS: Changes from the recent cardiac surgery and mitral valve repair are stable. Cardiac silhouette is mildly enlarged. No mediastinal widening.  There is lung base opacity, greater on the left, consistent with atelectasis, likely with small effusions. Remainder of the lungs is clear with no evidence of pulmonary edema.  No pneumothorax.  Endotracheal tube, nasal/orogastric tube, right internal jugular Swan-Ganz catheter, mediastinal tube and bilateral chest tubes are stable.  IMPRESSION: 1. No acute findings or evidence of an operative complication. 2.  Mild persistent lung base atelectasis probable small effusions. 3. Stable well-positioned support apparatus.   Electronically Signed   By: Lajean Manes M.D.   On: 04/16/2019 07:17  Assessment/Plan: S/P Procedure(s) (LRB): MITRAL VALVE REPAIR (MVR) using Memo 4D 34 MM Mitral Valve. (N/A) TRANSESOPHAGEAL ECHOCARDIOGRAM (TEE) (N/A) Clipping Of Atrial Appendage using AtriCure 45 MM AtriClip. (N/A)  CV - maintaining NSR w/ stable hemodynamics on milrinone 0.3, low dose Epi and Neo 60, PA pressures relatively low and cardiac index 2.4-2.5 - on nitric 15ppm - CVP not recorded - I/O's even yesterday and weight up - will try weaning nitric, increase lasix and start checking CVP  RESP - VDRF due to acute hypercarbic resp failure after initial extubation w/ severe COPD and acute diastolic CHF.  Currrently comfortable on vent w/ O2 sats 95-100% on 40% FiO2 and  CXR looks good - no SBT's done yesterday but looks like she might tolerate acute vent wean although she may still need more aggressive diuresis - increase lasix  NEURO - intact  HEME - Expected post op acute blood loss anemia, Hgb 8.7 stable.  Post op thrombocytopenia, platelet count down 85k this morning.  Leukocytosis w/out fever, mild, likely reactive.  FEN - hypokalemia due to loop diuretics - needs replacement.  Patient receiving tube feeds despite no signs of bowel function since OR - will check gastric residuals   Rexene Alberts, MD 04/16/2019 8:18 AM

## 2019-04-16 NOTE — Progress Notes (Signed)
eLink Physician-Brief Progress Note Patient Name: Maelyn Salser DOB: 1951/01/05 MRN: [id number]   Date of Service  04/16/2019  HPI/Events of Note  Agitation - Request to reorder bilateral soft wrist restraints that have been allowed to expire.   eICU Interventions  Will order: 1. Bilateral soft wrist restraints X 7 hours.     Intervention Category Major Interventions: Delirium, psychosis, severe agitation - evaluation and management  Turkessa Ostrom [place] 04/16/2019, 1:42 AM

## 2019-04-16 NOTE — Progress Notes (Addendum)
NAME:  Rebecca Carr, MRN:  [id number], DOB:  [birth year], LOS: 6 ADMISSION DATE:  04/10/2019, CONSULTATION DATE: 3/6  REFERRING MD:  Darcey Nora, CHIEF COMPLAINT: Acute on chronic respiratory failure  Brief History   69 year old female with chronic respiratory failure and underlying history of severe mitral valve disease and pulmonary hypertension.  Underwent mitral valve repair on 3/5, extubated on 3/6 but developed progressive somnolence and hypercarbia therefore pulmonary asked to evaluate and assist with care.  Past Medical History  Severe mitral valve prolapse Chronic diastolic heart failure Pulmonary hypertension Antiphospholipid syndrome Arthritis GERD Hashimoto's thyroiditis, hypothyroidism. COPD Chronic respiratory failure OSA  Significant Hospital Events   3/04 admitted 3/05 underwent mitral valve repair 3/06 Developed progressive hypercarbia post extubation pulmonary asked to evaluate.  Intubated. 3/07 Hemodynamically stable.  PA pressures improved post intubation with PA systolics almost immediately dropping from 70s down to 67s  Consults:  Thoracic surgery Advanced heart failure team Cardiology  Procedures:  Right IJ pulmonary artery catheter 3/5 >> ETT 3/6 >> CT's 3/5 >>   Significant Diagnostic Tests:    Micro Data:  Mitral Valve Tissue Culture 3/5 >> no organisms  Antimicrobials:  Ertapenem 3/4 >> 3/7 Vancomycin 3/5 >> 3/6   Interim history/subjective:  Remains on vent, nitric oxide.  On lasix, precedex, fentanyl, milrinone, epi gtts. PA pressure 44/26, PCWP 17, SVR 751, CI 2.26  Tmax 100 I/O - 3.4L UOP, +659ml in 24 hours.  Glucose range 122-140   Objective   Blood pressure (!) 86/59, pulse 91, temperature 98.6 F (37 C), resp. rate 15, height 5\' 8"  (1.727 m), weight 107.5 kg, SpO2 100 %. PAP: (40-64)/(17-32) 59/28 CVP:  [13 mmHg-15 mmHg] 13 mmHg CO:  [4.7 L/min-5.8 L/min] 4.7 L/min CI:  [2.3 L/min/m2-2.8 L/min/m2] 2.3 L/min/m2  Vent Mode:  PRVC FiO2 (%):  [40 %-100 %] 80 % Set Rate:  [15 bmp] 15 bmp Vt Set:  [510 mL] 510 mL PEEP:  [5 cmH20] 5 cmH20 Plateau Pressure:  [24 cmH20-28 cmH20] 24 cmH20   Intake/Output Summary (Last 24 hours) at 04/16/2019 1141 Last data filed at 04/16/2019 1018 Gross per 24 hour  Intake 4667.76 ml  Output 4420 ml  Net 247.76 ml   Filed Weights   04/14/19 0500 04/15/19 0430 04/16/19 0500  Weight: 108.7 kg 106.3 kg 107.5 kg    Examination: General: critically ill appearing adult female lying in bed on vent in NAD HEENT: MM pink/moist, ETT/OGT in place, R IJ PA cath Neuro: opens eyes to voice, mouths questions / appropriate, nods  CV: s1s2 rrr, distant tones, no m/r/g, sternotomy dressing intact, CT's in place with serosanguinous drainage  PULM:  Non-labored on vent, lungs bilaterally clear anterior GI: soft, bsx4 hypoactive  Extremities: warm/dry, trace edema  Skin: no rashes or lesions  Resolved Hospital Problem list     Assessment & Plan:   Acute on Chronic Hypercarbic Hypoxemic Respiratory Failure OSA Suspect mixed picture of decreased ventilatory effort s/p cardio-thoracic surgery superimposed on underlying restrictive lund disease, +/- emphysema, residual sedation effects and pulmonary edema. PA pressures significantly improved with addition of nitric oxide.   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 Pulmonary Edema Acute Diastolic Heart Failure  Cardiogenic shock AFwRVR ECHO 03/2019 with LVEF 60-65%, Grade II DD -vasopressors / inotropes per primary service -ICU monitoring  -continue diuresis with lasix gtt -amiodarone per Cardiology   Hypokalemia    Hypomagnesemia  -monitor electrolytes closely with diuresis, replace as indicated  -continue aldactone   Postoperative Anemia  Thrombocytopenia  No evidence of bleeding -follow CBC  -transfuse for Hgb <8%  Leukocytosis Suspect reactive -trend CBC   Hyperglycemia  -SSI   Hypothyroidism -continue synthroid   Hx Lupus, Sjogren's Disease Followed at [facility]. Recent neck/jaw pain may be related to Sjogren's, CT neck unremarkable. Treated with 3 days abx.  Hx Antiphospholipid Syndrome  -defer anticoagulation to primary   Best practice:  Diet: NPO, will start tube feeds Pain/Anxiety/Delirium protocol (if indicated): 3/6 VAP protocol (if indicated): 3/6 DVT prophylaxis:   Per primary  GI prophylaxis: PPI Glucose control: SSI Mobility: bedrest Code Status: full code  Family Communication: per primary  Disposition: icu    Critical care time: 32 minutes    Noe Gens, MSN, NP-C [facility] 04/16/2019, 11:56 AM   Please see [email or web address] for pager details.

## 2019-04-17 ENCOUNTER — Inpatient Hospital Stay (HOSPITAL_COMMUNITY): Payer: Medicare Other

## 2019-04-17 ENCOUNTER — Inpatient Hospital Stay: Payer: Self-pay

## 2019-04-17 DIAGNOSIS — J9601 Acute respiratory failure with hypoxia: Secondary | ICD-10-CM

## 2019-04-17 LAB — CBC
HCT: 27.8 % — ABNORMAL LOW (ref 36.0–46.0)
Hemoglobin: 8.6 g/dL — ABNORMAL LOW (ref 12.0–15.0)
MCH: 29.1 pg (ref 26.0–34.0)
MCHC: 30.9 g/dL (ref 30.0–36.0)
MCV: 93.9 fL (ref 80.0–100.0)
Platelets: 88 10*3/uL — ABNORMAL LOW (ref 150–400)
RBC: 2.96 MIL/uL — ABNORMAL LOW (ref 3.87–5.11)
RDW: 13.7 % (ref 11.5–15.5)
WBC: 14.7 10*3/uL — ABNORMAL HIGH (ref 4.0–10.5)
nRBC: 0 % (ref 0.0–0.2)

## 2019-04-17 LAB — COMPREHENSIVE METABOLIC PANEL
ALT: 24 U/L (ref 0–44)
AST: 27 U/L (ref 15–41)
Albumin: 2.9 g/dL — ABNORMAL LOW (ref 3.5–5.0)
Alkaline Phosphatase: 81 U/L (ref 38–126)
Anion gap: 11 (ref 5–15)
BUN: 26 mg/dL — ABNORMAL HIGH (ref 8–23)
CO2: 28 mmol/L (ref 22–32)
Calcium: 8.3 mg/dL — ABNORMAL LOW (ref 8.9–10.3)
Chloride: 96 mmol/L — ABNORMAL LOW (ref 98–111)
Creatinine, Ser: 1.1 mg/dL — ABNORMAL HIGH (ref 0.44–1.00)
GFR calc Af Amer: 60 mL/min — ABNORMAL LOW (ref >60)
GFR calc non Af Amer: 52 mL/min — ABNORMAL LOW (ref >60)
Glucose, Bld: 153 mg/dL — ABNORMAL HIGH (ref 70–99)
Potassium: 4.8 mmol/L (ref 3.5–5.1)
Sodium: 135 mmol/L (ref 135–145)
Total Bilirubin: 1.4 mg/dL — ABNORMAL HIGH (ref 0.3–1.2)
Total Protein: 5.5 g/dL — ABNORMAL LOW (ref 6.5–8.1)

## 2019-04-17 LAB — BASIC METABOLIC PANEL
Anion gap: 11 (ref 5–15)
BUN: 30 mg/dL — ABNORMAL HIGH (ref 8–23)
CO2: 28 mmol/L (ref 22–32)
Calcium: 8.3 mg/dL — ABNORMAL LOW (ref 8.9–10.3)
Chloride: 94 mmol/L — ABNORMAL LOW (ref 98–111)
Creatinine, Ser: 1.01 mg/dL — ABNORMAL HIGH (ref 0.44–1.00)
GFR calc Af Amer: >60 mL/min (ref >60)
GFR calc non Af Amer: 57 mL/min — ABNORMAL LOW (ref >60)
Glucose, Bld: 152 mg/dL — ABNORMAL HIGH (ref 70–99)
Potassium: 4.5 mmol/L (ref 3.5–5.1)
Sodium: 133 mmol/L — ABNORMAL LOW (ref 135–145)

## 2019-04-17 LAB — POCT I-STAT 7, (LYTES, BLD GAS, ICA,H+H)
Acid-Base Excess: 4 mmol/L — ABNORMAL HIGH (ref 0.0–2.0)
Bicarbonate: 29.4 mmol/L — ABNORMAL HIGH (ref 20.0–28.0)
Calcium, Ion: 1.15 mmol/L (ref 1.15–1.40)
HCT: 26 % — ABNORMAL LOW (ref 36.0–46.0)
Hemoglobin: 8.8 g/dL — ABNORMAL LOW (ref 12.0–15.0)
O2 Saturation: 98 %
Patient temperature: 38.4
Potassium: 4.6 mmol/L (ref 3.5–5.1)
Sodium: 133 mmol/L — ABNORMAL LOW (ref 135–145)
TCO2: 31 mmol/L (ref 22–32)
pCO2 arterial: 49.4 mmHg — ABNORMAL HIGH (ref 32.0–48.0)
pH, Arterial: 7.389 (ref 7.350–7.450)
pO2, Arterial: 106 mmHg (ref 83.0–108.0)

## 2019-04-17 LAB — GLUCOSE, CAPILLARY
Glucose-Capillary: 133 mg/dL — ABNORMAL HIGH (ref 70–99)
Glucose-Capillary: 142 mg/dL — ABNORMAL HIGH (ref 70–99)
Glucose-Capillary: 144 mg/dL — ABNORMAL HIGH (ref 70–99)
Glucose-Capillary: 146 mg/dL — ABNORMAL HIGH (ref 70–99)
Glucose-Capillary: 149 mg/dL — ABNORMAL HIGH (ref 70–99)
Glucose-Capillary: 155 mg/dL — ABNORMAL HIGH (ref 70–99)

## 2019-04-17 MED ORDER — POTASSIUM CHLORIDE 20 MEQ/15ML (10%) PO SOLN
40.0000 meq | Freq: Two times a day (BID) | ORAL | Status: AC
  Administered 2019-04-17: 40 meq
  Filled 2019-04-17: qty 30

## 2019-04-17 MED ORDER — METOLAZONE 5 MG PO TABS
5.0000 mg | ORAL_TABLET | Freq: Once | ORAL | Status: AC
  Administered 2019-04-17: 5 mg via ORAL
  Filled 2019-04-17: qty 1

## 2019-04-17 MED ORDER — POTASSIUM CHLORIDE 20 MEQ/15ML (10%) PO SOLN
40.0000 meq | Freq: Two times a day (BID) | ORAL | Status: DC
  Administered 2019-04-17: 40 meq via ORAL
  Filled 2019-04-17: qty 30

## 2019-04-17 MED ORDER — LEVALBUTEROL HCL 1.25 MG/0.5ML IN NEBU
1.2500 mg | INHALATION_SOLUTION | Freq: Four times a day (QID) | RESPIRATORY_TRACT | Status: DC | PRN
  Administered 2019-04-18: 1.25 mg via RESPIRATORY_TRACT
  Filled 2019-04-17: qty 0.5

## 2019-04-17 MED ORDER — VANCOMYCIN HCL 1250 MG/250ML IV SOLN
1250.0000 mg | INTRAVENOUS | Status: DC
  Administered 2019-04-18: 1250 mg via INTRAVENOUS
  Filled 2019-04-17: qty 250

## 2019-04-17 NOTE — Progress Notes (Signed)
Patient writing notes to husband, stating that she is seeing the ceiling move.  Thinking the nurses are doing it.  Repositioned back to right side per request.  Lab sent

## 2019-04-17 NOTE — Progress Notes (Signed)
Husband at bedside. Patient writing notes.  Asked /motioned for this nurse to leave room. Nurse close by in case any questions were raised.

## 2019-04-17 NOTE — Progress Notes (Addendum)
TCTS DAILY ICU PROGRESS NOTE                   [address]            [place] [address]          [phone]   4 Days Post-Op Procedure(s) (LRB): MITRAL VALVE REPAIR (MVR) using Memo 4D 34 MM Mitral Valve. (N/A) TRANSESOPHAGEAL ECHOCARDIOGRAM (TEE) (N/A) Clipping Of Atrial Appendage using AtriCure 45 MM AtriClip. (N/A)  Total Length of Stay:  LOS: 7 days   Subjective: Following commands this morning.  Will plan to try acute vent wean today.   Objective: Vital signs in last 24 hours: Temp:  [98.6 F (37 C)-101.5 F (38.6 C)] 99 F (37.2 C) (03/09 0831) Pulse Rate:  [99-106] 106 (03/09 0825) Cardiac Rhythm: Normal sinus rhythm (03/09 0400) Resp:  [9-19] 15 (03/09 0825) BP: (86-125)/(57-73) 104/62 (03/09 0825) SpO2:  [82 %-100 %] 93 % (03/09 0825) Arterial Line BP: (80-127)/(49-74) 103/60 (03/09 0800) FiO2 (%):  [40 %-100 %] 40 % (03/09 0825) Weight:  [106.9 kg] 106.9 kg (03/09 0500)  Filed Weights   04/15/19 0430 04/16/19 0500 04/17/19 0500  Weight: 106.3 kg 107.5 kg 106.9 kg    Weight change: -0.6 kg   Hemodynamic parameters for last 24 hours: PAP: (38-65)/(20-38) 43/24 CVP:  [10 mmHg-27 mmHg] 13 mmHg CO:  [4.3 L/min-5.3 L/min] 5.3 L/min CI:  [2.1 L/min/m2-2.5 L/min/m2] 2.5 L/min/m2  Intake/Output from previous day: 03/08 0701 - 03/09 0700 In: 4372.7 [I.V.:2259.8; NG/GT:1000; IV Piggyback:1112.9] Out: 9147 [Urine:4185; Chest Tube:440]  Intake/Output this shift: No intake/output data recorded.  Current Meds: Scheduled Meds: . acetaminophen  1,000 mg Oral Q6H   Or  . acetaminophen (TYLENOL) oral liquid 160 mg/5 mL  1,000 mg Per Tube Q6H  . bisacodyl  10 mg Rectal Daily  . chlorhexidine gluconate (MEDLINE KIT)  15 mL Mouth Rinse BID  . Chlorhexidine Gluconate Cloth  6 each Topical Daily  . docusate  200 mg Per Tube Daily  . feeding supplement (PRO-STAT SUGAR FREE 64)  30 mL Per Tube TID  . feeding supplement (VITAL HIGH PROTEIN)  1,000 mL  Per Tube Q24H  . insulin aspart  0-24 Units Subcutaneous Q4H  . ipratropium  0.5 mg Nebulization Q6H  . levalbuterol  1.25 mg Nebulization Q6H  . levothyroxine  125 mcg Per Tube QAC breakfast  . liothyronine  5 mcg Per Tube Daily  . mouth rinse  15 mL Mouth Rinse 10 times per day  . pantoprazole (PROTONIX) IV  40 mg Intravenous QHS  . sodium chloride flush  10-40 mL Intracatheter Q12H  . sodium chloride flush  3 mL Intravenous Q12H  . spironolactone  25 mg Oral Daily   Continuous Infusions: . sodium chloride 10 mL/hr at 04/17/19 0700  . sodium chloride    . sodium chloride 10 mL/hr at 04/13/19 1450  . amiodarone 30 mg/hr (04/17/19 0700)  . ceFEPime (MAXIPIME) IV Stopped (04/17/19 8295)  . dexmedetomidine (PRECEDEX) IV infusion 0.5 mcg/kg/hr (04/17/19 0700)  . epinephrine Stopped (04/16/19 1733)  . feeding supplement (VITAL AF 1.2 CAL)    . fentaNYL infusion INTRAVENOUS 50 mcg/hr (04/17/19 0700)  . furosemide (LASIX) infusion 10 mg/hr (04/17/19 0700)  . lactated ringers 20 mL/hr at 04/16/19 0438  . milrinone 0.2 mcg/kg/min (04/17/19 0700)  . norepinephrine (LEVOPHED) Adult infusion 8 mcg/min (04/17/19 0700)  . [START ON 04/18/2019] vancomycin     PRN Meds:.sodium chloride, bisacodyl, fentaNYL, metoprolol tartrate,  midazolam, morphine injection, ondansetron (ZOFRAN) IV, sodium chloride flush, sodium chloride flush  General appearance: alert, cooperative and no distress Heart: sinus tachycardia Lungs: clear to auscultation bilaterally  Abdomen: soft, non-tender; bowel sounds normal; no masses,  no organomegaly Extremities: extremities normal, atraumatic, no cyanosis or edema Wound: clean and dry  Lab Results: CBC: Recent Labs    04/16/19 0431 04/16/19 0438 04/17/19 0433 04/17/19 0445  WBC 15.9*  --   --  14.7*  HGB 8.7*   < > 8.8* 8.6*  HCT 27.0*   < > 26.0* 27.8*  PLT 85*  --   --  88*   < > = values in this interval not displayed.   BMET:  Recent Labs     04/16/19 1646 04/16/19 1646 04/17/19 0433 04/17/19 0445  NA 133*   < > 133* 135  K 5.2*   < > 4.6 4.8  CL 95*  --   --  96*  CO2 28  --   --  28  GLUCOSE 164*  --   --  153*  BUN 22  --   --  26*  CREATININE 0.99  --   --  1.10*  CALCIUM 8.4*  --   --  8.3*   < > = values in this interval not displayed.    CMET: Lab Results  Component Value Date   WBC 14.7 (H) 04/17/2019   HGB 8.6 (L) 04/17/2019   HCT 27.8 (L) 04/17/2019   PLT 88 (L) 04/17/2019   GLUCOSE 153 (H) 04/17/2019   ALT 24 04/17/2019   AST 27 04/17/2019   NA 135 04/17/2019   K 4.8 04/17/2019   CL 96 (L) 04/17/2019   CREATININE 1.10 (H) 04/17/2019   BUN 26 (H) 04/17/2019   CO2 28 04/17/2019   TSH 5.506 (H) 04/16/2019   INR 1.5 (H) 04/13/2019   HGBA1C 6.3 (H) 04/12/2019      PT/INR: No results for input(s): LABPROT, INR in the last 72 hours. Radiology: DG CHEST PORT 1 VIEW  Result Date: 04/16/2019 CLINICAL DATA:  Low oxygenation, check ETT EXAM: PORTABLE CHEST 1 VIEW COMPARISON:  04/16/2019 FINDINGS: Postoperative changes. Support devices are stable including endotracheal tube which is 5 cm above the carina. Bilateral chest tubes. No pneumothorax. Cardiomegaly, vascular congestion. Left base opacity, likely atelectasis. IMPRESSION: Stable support devices.  Bilateral chest tubes without pneumothorax. Cardiomegaly, left base atelectasis. Electronically Signed   By: Rolm Baptise M.D.   On: 04/16/2019 11:53     Assessment/Plan: S/P Procedure(s) (LRB): MITRAL VALVE REPAIR (MVR) using Memo 4D 34 MM Mitral Valve. (N/A) TRANSESOPHAGEAL ECHOCARDIOGRAM (TEE) (N/A) Clipping Of Atrial Appendage using AtriCure 45 MM AtriClip. (N/A)   1. CV-Continue Amio and milrinone, lasix gtt, and Levo.  2. Pulm-VDRF due to acute hypercarbic resp failure after initial extubation w/ severe COPD and acute diastolic CHF-remains on the ventilator FiO2 of 40%.  3. Renal-creatinine 1.10, electrolytes okay potassium 4.8. Good urine output.  Remains 4kg over baseline weight. Continue.  4. H and H 8.6/27.8, expected acute blood loss anemia 5. Endo-blood glucose well controlled 6. Continue empiric Antibiotics. Pharmacy consulted.  7. + bowel sounds but no bowel movement since surgery. Plan to start TF at 44m/hr today per nutrition. 385mProstat TID.   Plan: Attempt extubation today. Continue chest tubes for now. Continue diuresis. Weaning pressor as tolerated.     TeElgie Collard/10/2019 8:37 AM   I have seen and examined the patient and agree with the assessment  and plan as outlined.  Episodes wide-complex tachycardia and Afib w/ RVR yesterday shortly after levophed started.  Rhythm stable overnight on IV amiodarone w/ decreased Epi and levophed dosage.  Diuresing on lasix drip but net I/O's still only 500 mL negative and weight up 4 kg from preop - may still need more diuresis prior to extubation, although primary mode of failure after initial extubation was due to hypercarbia and hypoventilation rather than volume overload.  Empiric Vanc and Maxepime started yesterday due to increased opacity left lung base although patient reportedly has minimal airway secretions, remains afebrile and WBC trending down.   Stop Epi  Stop nitric oxide  Wean milrinone slowly  Wean levophed as tolerated  Continue lasix drip  Continue amiodarone  SBT  Rexene Alberts, MD 04/17/2019 11:41 AM

## 2019-04-17 NOTE — Progress Notes (Signed)
EVENING ROUNDS NOTE :     [address]       [place] [address]             [phone]                 4 Days Post-Op Procedure(s) (LRB): MITRAL VALVE REPAIR (MVR) using Memo 4D 34 MM Mitral Valve. (N/A) TRANSESOPHAGEAL ECHOCARDIOGRAM (TEE) (N/A) Clipping Of Atrial Appendage using AtriCure 45 MM AtriClip. (N/A)   Total Length of Stay:  LOS: 7 days  Events:  No events Continue vent management Weaning levo     BP (!) 106/57   Pulse (!) 104   Temp 98 F (36.7 C) (Oral)   Resp 15   Ht 5\' 8"  (1.727 m)   Wt 106.9 kg   SpO2 95%   BMI 35.83 kg/m   PAP: (38-46)/(23-29) 43/24 CVP:  [12 mmHg-18 mmHg] 12 mmHg CO:  [4.3 L/min-5.3 L/min] 5.3 L/min CI:  [2.1 L/min/m2-2.5 L/min/m2] 2.5 L/min/m2  Vent Mode: PRVC FiO2 (%):  [40 %-60 %] 40 % Set Rate:  [15 bmp] 15 bmp Vt Set:  [510 mL] 510 mL PEEP:  [5 cmH20] 5 cmH20 Plateau Pressure:  [20 cmH20-25 cmH20] 25 cmH20  . sodium chloride    . amiodarone 30 mg/hr (04/17/19 1900)  . ceFEPime (MAXIPIME) IV Stopped (04/17/19 1438)  . dexmedetomidine (PRECEDEX) IV infusion 1 mcg/kg/hr (04/17/19 1900)  . feeding supplement (VITAL AF 1.2 CAL) 1,000 mL (04/17/19 1130)  . fentaNYL infusion INTRAVENOUS 200 mcg/hr (04/17/19 1900)  . furosemide (LASIX) infusion 15 mg/hr (04/17/19 1900)  . lactated ringers 20 mL/hr at 04/16/19 0438  . milrinone 0.2 mcg/kg/min (04/17/19 1900)  . norepinephrine (LEVOPHED) Adult infusion 4 mcg/min (04/17/19 1900)  . [START ON 04/18/2019] vancomycin      I/O last 3 completed shifts: In: 5697.8 [I.V.:3155.4; NG/GT:1329.6; IV Piggyback:1212.9] Out: [id number] [Urine:7585; Other:275; Chest Tube:595]   CBC Latest Ref Rng & Units 04/17/2019 04/17/2019 04/16/2019  WBC 4.0 - 10.5 K/uL 14.7(H) - -  Hemoglobin 12.0 - 15.0 g/dL 8.6(L) 8.8(L) 9.2(L)  Hematocrit 36.0 - 46.0 % 27.8(L) 26.0(L) 27.0(L)  Platelets 150 - 400 K/uL 88(L) - -    BMP Latest Ref Rng & Units 04/17/2019 04/17/2019 04/17/2019  Glucose 70 - 99  mg/dL 152(H) 153(H) -  BUN 8 - 23 mg/dL 30(H) 26(H) -  Creatinine 0.44 - 1.00 mg/dL 1.01(H) 1.10(H) -  BUN/Creat Ratio 12 - 28 - - -  Sodium 135 - 145 mmol/L 133(L) 135 133(L)  Potassium 3.5 - 5.1 mmol/L 4.5 4.8 4.6  Chloride 98 - 111 mmol/L 94(L) 96(L) -  CO2 22 - 32 mmol/L 28 28 -  Calcium 8.9 - 10.3 mg/dL 8.3(L) 8.3(L) -    ABG    Component Value Date/Time   PHART 7.389 04/17/2019 0433   PCO2ART 49.4 (H) 04/17/2019 0433   PO2ART 106.0 04/17/2019 0433   HCO3 29.4 (H) 04/17/2019 0433   TCO2 31 04/17/2019 0433   O2SAT 98.0 04/17/2019 0433       Melodie Bouillon, MD 04/17/2019 7:49 PM

## 2019-04-17 NOTE — Progress Notes (Addendum)
NAME:  Rebecca Carr, MRN:  [id number], DOB:  03-09-1950, LOS: 7 ADMISSION DATE:  04/10/2019, CONSULTATION DATE: 3/6  REFERRING MD:  Darcey Nora, CHIEF COMPLAINT: Acute on chronic respiratory failure  Brief History   69 year old female with chronic respiratory failure 2/2 COPD, underlying history of severe mitral valve disease and pulmonary hypertension.  Underwent mitral valve repair on 3/5, extubated on 3/6 but developed progressive somnolence and hypercarbia therefore pulmonary asked to evaluate and assist with care.  Past Medical History  Severe mitral valve prolapse Chronic diastolic heart failure Pulmonary hypertension Antiphospholipid syndrome Arthritis GERD Hashimoto's thyroiditis, hypothyroidism. COPD Chronic respiratory failure OSA  Significant Hospital Events   3/04 admitted 3/05 underwent mitral valve repair 3/06 Developed progressive hypercarbia post extubation pulmonary asked to evaluate.  Intubated. 3/07 Hemodynamically stable.  PA pressures improved post intubation with PA systolics almost immediately dropping from 70s down to 40s 3/09 FiO2 weaned to 40%  Consults:  Thoracic surgery Advanced heart failure team Cardiology  Procedures:  Right IJ pulmonary artery catheter 3/5 >>  ETT 3/6 >> CT's 3/5 >>   Significant Diagnostic Tests:    Micro Data:  Mitral Valve Tissue Culture 3/5 >> no organisms  Antimicrobials:  Ertapenem 3/4 >> 3/7 Vancomycin 3/5 >> 3/6  Cefepime 3/9 >>  Vanco 3/9 >>   Interim history/subjective:  Nitric weaned, FiO2 down to 40%, PEEP 5 Remains on 8 mcg levophed, amiodarone, milrinone, lasix gtt's  On fentanyl, precedex for sedation Glucose range 133-151 Tmax 101.1 / WBC down to 14.7  I/O 4.1L UOP, -556ml in 24h  Objective   Blood pressure 108/67, pulse (!) 101, temperature 100.2 F (37.9 C), resp. rate 13, height 5\' 8"  (1.727 m), weight 106.9 kg, SpO2 99 %. PAP: (38-65)/(20-38) 43/24 CVP:  [10 mmHg-27 mmHg] 13 mmHg CO:   [4.3 L/min-5.3 L/min] 5.3 L/min CI:  [2.1 L/min/m2-2.5 L/min/m2] 2.5 L/min/m2  Vent Mode: PRVC FiO2 (%):  [40 %-100 %] 40 % Set Rate:  [15 bmp] 15 bmp Vt Set:  [510 mL] 510 mL PEEP:  [5 cmH20] 5 cmH20 Plateau Pressure:  [21 cmH20-25 cmH20] 21 cmH20   Intake/Output Summary (Last 24 hours) at 04/17/2019 [id number] Last data filed at 04/17/2019 0700 Gross per 24 hour  Intake 4372.68 ml  Output 4900 ml  Net -527.32 ml   Filed Weights   04/15/19 0430 04/16/19 0500 04/17/19 0500  Weight: 106.3 kg 107.5 kg 106.9 kg    Examination: General: critically ill appearing female lying in bed on vent in NAD HEENT: MM pink/moist, ETT, pupils 2-30mm, anicteric, R IJ introducer with PA cath Neuro: sedate but opens eyes to voice, nods/interacts CV: s1s2 distant tones, regular, SR on monitor, PA Pressure 44/25, no m/r/g, sternotomy incision clean/dry, CT's with serosanguinous drainage  PULM:  Non-labored on vent, lungs bilaterally clear anterior, diminished base on left GI: soft, bsx4 active  Extremities: warm/dry, trace edema  Skin: no rashes or lesions  Resolved Hospital Problem list     Assessment & Plan:   Acute on Chronic Hypercarbic Hypoxemic Respiratory Failure LLL Atelectasis vs Infiltrate  OSA Suspect mixed picture of decreased ventilatory effort s/p cardio-thoracic surgery superimposed on underlying restrictive lund disease, +/- emphysema, residual sedation effects and pulmonary edema. PA pressures significantly improved with addition of nitric oxide.   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 > defer to TCTS / Cardiology  -continue amiodarone gtt per Cardiology > NSR 3/9 am   Sedation Needs in setting of Mechanical Ventilation  -PAD protocol with precedex, fentanyl -RASS Goal 0 to -1   Hypokalemia / Hyperkalemia  Hypomagnesemia  Hyponatremia -defer aldactone to Cardiology  -follow electrolytes closely, repeat BMP at 1500   Postoperative Anemia  Thrombocytopenia  No evidence of bleeding -trend CBC -transfuse for Hgb <8%  Hyperglycemia  -SSI   Hypothyroidism -continue synthroid   Hx Lupus, Sjogren's Disease Followed at [facility]. Recent neck/jaw pain may be related to Sjogren's, CT neck unremarkable. Treated with 3 days abx.  Hx Antiphospholipid Syndrome  -defer anticoagulation to primary   Best practice:  Diet: NPO, TF Pain/Anxiety/Delirium protocol (if indicated): in place VAP protocol (if indicated): in plcae DVT prophylaxis:   Per primary  GI prophylaxis: PPI Glucose control: SSI Mobility: BR Code Status: full code  Family Communication: per primary  Disposition: ICU    Critical care time: 79 minutes    Noe Gens, MSN, NP-C [facility] 04/17/2019, 7:18 AM   Please see [email or web address] for pager details.

## 2019-04-17 NOTE — Progress Notes (Signed)
RT note: Nitric oxide stopped per MD order.  Attempted SBT on patient however patient's sats dropped to 89%.  Placed patient back on full support ventilation and is currently tolerating well.  Will continue to monitor.

## 2019-04-17 NOTE — Progress Notes (Signed)
Pharmacy Antibiotic Note  Rebecca Carr is a 69 y.o. female admitted on 04/10/2019. Pharmacy has been consulted for vancomycin and cefepime dosing for PNA. Patient was started on cefepime 2g IV q8h and vancomycin 1500mg  IV q24h. Today, Scr is 0.99 and based on vancomycin regimen of 1500mg  IV q24h the expected AUC with current Scr of 0.99 would be 558 which is supratherapeutic (AUC goal 400-550). WBC 15.9. Tmax 101.3.   Vancomycin 1250 mg IV Q 24 hrs. Goal AUC 400-550. Expected AUC: 466 SCr used: 0.99   Plan: Adjust vancomycin to 1250mg  IV q24h  Continue cefepime 2g IV q 8 hrs. F/u renal function, clinical course, vancomycin levels as needed.  Height: 5\' 8"  (172.7 cm) Weight: 236 lb 15.9 oz (107.5 kg) IBW/kg (Calculated) : 63.9  Temp (24hrs), Avg:99.8 F (37.7 C), Min:98.6 F (37 C), Max:101.3 F (38.5 C)  Recent Labs  Lab 04/13/19 1315 04/13/19 2000 04/14/19 0259 04/14/19 1610 04/15/19 0417 04/16/19 0431 04/16/19 1646  WBC  --  13.0* 11.9* 16.0* 16.6* 15.9*  --   CREATININE   < >  --  0.90 0.91 0.92 0.89 0.99   < > = values in this interval not displayed.    Estimated Creatinine Clearance: 69.8 mL/min (by C-G formula based on SCr of 0.99 mg/dL).    Allergies  Allergen Reactions  . Iodine Anaphylaxis  . Iohexol Anaphylaxis     Desc: Patient states she is allergic to iodine and had a "code blue" incident after an injection for some type of imaging study.   . Penicillins Rash    PCN IN LARGE DOSES. Did it involve swelling of the face/tongue/throat, SOB, or low BP? No Did it involve sudden or severe rash/hives, skin peeling, or any reaction on the inside of your mouth or nose? No Did you need to seek medical attention at a hospital or doctor's office? No When did it last happen? More than 10 years ago If all above answers are "NO", may proceed with cephalosporin use.     Antimicrobials this admission:  Ertapenem 3/4 > 3/5 Vancomycin 3/8 >  Cefepime 3/8 >    Dose adjustments this admission:  3/8: vancomycin 1500mg  IV q24h changed to vancomycin 1250mg  IV q24h based on renal function   Microbiology results:  3/8 BCx x 2:  3/8 Resp Cx: no orgs 3/4 MRSA PCR: neg  Cristela Felt, PharmD PGY1 Pharmacy Resident [employer]: [phone]   04/17/2019 4:52 AM

## 2019-04-17 NOTE — Progress Notes (Signed)
Patient currently out of mittens contracted with patient again to not pull at any tubes on her honor. She is alert, knows she is in the hospital. Asked if she could write a note. Got her paper and pencil. She is writing notes about not wanting to die. Discussed with her that that is what we are trying to prevent. [place] placed on for patient. Talked with husband earlier. He was updated and asked what kind of music patient likes. Call bell at patients side. Trial of mitts off. RN closeby. Observing patient.

## 2019-04-17 NOTE — Progress Notes (Signed)
Floor RN aware PICC line will be placed tomorrow.

## 2019-04-17 NOTE — Progress Notes (Signed)
eLink Physician-Brief Progress Note Patient Name: Rebecca Carr DOB: 06/12/1950 MRN: [id number]   Date of Service  04/17/2019  HPI/Events of Note  Pt needs order for soft restraints.  eICU Interventions  Soft restraints ordered.        [name] 04/17/2019, 10:16 PM

## 2019-04-18 ENCOUNTER — Inpatient Hospital Stay (HOSPITAL_COMMUNITY): Payer: Medicare Other

## 2019-04-18 LAB — AEROBIC/ANAEROBIC CULTURE W GRAM STAIN (SURGICAL/DEEP WOUND): Culture: NO GROWTH

## 2019-04-18 LAB — POCT I-STAT 7, (LYTES, BLD GAS, ICA,H+H)
Acid-Base Excess: 15 mmol/L — ABNORMAL HIGH (ref 0.0–2.0)
Bicarbonate: 40.1 mmol/L — ABNORMAL HIGH (ref 20.0–28.0)
Calcium, Ion: 1.13 mmol/L — ABNORMAL LOW (ref 1.15–1.40)
HCT: 25 % — ABNORMAL LOW (ref 36.0–46.0)
Hemoglobin: 8.5 g/dL — ABNORMAL LOW (ref 12.0–15.0)
O2 Saturation: 98 %
Patient temperature: 98.9
Potassium: 4.5 mmol/L (ref 3.5–5.1)
Sodium: 129 mmol/L — ABNORMAL LOW (ref 135–145)
TCO2: 42 mmol/L — ABNORMAL HIGH (ref 22–32)
pCO2 arterial: 54.1 mmHg — ABNORMAL HIGH (ref 32.0–48.0)
pH, Arterial: 7.479 — ABNORMAL HIGH (ref 7.350–7.450)
pO2, Arterial: 95 mmHg (ref 83.0–108.0)

## 2019-04-18 LAB — BASIC METABOLIC PANEL
Anion gap: 14 (ref 5–15)
Anion gap: 13 (ref 5–15)
BUN: 36 mg/dL — ABNORMAL HIGH (ref 8–23)
BUN: 37 mg/dL — ABNORMAL HIGH (ref 8–23)
CO2: 30 mmol/L (ref 22–32)
CO2: 32 mmol/L (ref 22–32)
Calcium: 8.5 mg/dL — ABNORMAL LOW (ref 8.9–10.3)
Calcium: 8.7 mg/dL — ABNORMAL LOW (ref 8.9–10.3)
Chloride: 89 mmol/L — ABNORMAL LOW (ref 98–111)
Chloride: 86 mmol/L — ABNORMAL LOW (ref 98–111)
Creatinine, Ser: 1.08 mg/dL — ABNORMAL HIGH (ref 0.44–1.00)
Creatinine, Ser: 1.09 mg/dL — ABNORMAL HIGH (ref 0.44–1.00)
GFR calc Af Amer: >60 mL/min (ref >60)
GFR calc Af Amer: >60 mL/min (ref >60)
GFR calc non Af Amer: 53 mL/min — ABNORMAL LOW (ref >60)
GFR calc non Af Amer: 52 mL/min — ABNORMAL LOW (ref >60)
Glucose, Bld: 130 mg/dL — ABNORMAL HIGH (ref 70–99)
Glucose, Bld: 139 mg/dL — ABNORMAL HIGH (ref 70–99)
Potassium: 4.5 mmol/L (ref 3.5–5.1)
Potassium: 4.4 mmol/L (ref 3.5–5.1)
Sodium: 133 mmol/L — ABNORMAL LOW (ref 135–145)
Sodium: 131 mmol/L — ABNORMAL LOW (ref 135–145)

## 2019-04-18 LAB — GLUCOSE, CAPILLARY
Glucose-Capillary: 120 mg/dL — ABNORMAL HIGH (ref 70–99)
Glucose-Capillary: 127 mg/dL — ABNORMAL HIGH (ref 70–99)
Glucose-Capillary: 139 mg/dL — ABNORMAL HIGH (ref 70–99)
Glucose-Capillary: 143 mg/dL — ABNORMAL HIGH (ref 70–99)
Glucose-Capillary: 144 mg/dL — ABNORMAL HIGH (ref 70–99)
Glucose-Capillary: 147 mg/dL — ABNORMAL HIGH (ref 70–99)

## 2019-04-18 LAB — CULTURE, RESPIRATORY W GRAM STAIN: Culture: NO GROWTH

## 2019-04-18 LAB — CBC
HCT: 26.9 % — ABNORMAL LOW (ref 36.0–46.0)
Hemoglobin: 8.4 g/dL — ABNORMAL LOW (ref 12.0–15.0)
MCH: 29.1 pg (ref 26.0–34.0)
MCHC: 31.2 g/dL (ref 30.0–36.0)
MCV: 93.1 fL (ref 80.0–100.0)
Platelets: 125 10*3/uL — ABNORMAL LOW (ref 150–400)
RBC: 2.89 MIL/uL — ABNORMAL LOW (ref 3.87–5.11)
RDW: 13.6 % (ref 11.5–15.5)
WBC: 15.2 10*3/uL — ABNORMAL HIGH (ref 4.0–10.5)
nRBC: 0.1 % (ref 0.0–0.2)

## 2019-04-18 MED ORDER — MILRINONE LACTATE IN DEXTROSE 20-5 MG/100ML-% IV SOLN: 0.1250 ug/kg/min | INTRAVENOUS | Status: DC

## 2019-04-18 MED ORDER — AMIODARONE IV BOLUS ONLY 150 MG/100ML
150.0000 mg | Freq: Once | INTRAVENOUS | Status: AC
  Administered 2019-04-18: 150 mg via INTRAVENOUS

## 2019-04-18 MED ORDER — METOLAZONE 5 MG PO TABS: 5.0000 mg | ORAL_TABLET | Freq: Once | ORAL | Status: DC

## 2019-04-18 MED ORDER — SODIUM CHLORIDE 0.9 % IV SOLN
INTRAVENOUS | Status: DC | PRN
  Administered 2019-04-20: 06:00:00 500 mL via INTRAVENOUS

## 2019-04-18 MED ORDER — METOLAZONE 5 MG PO TABS
5.0000 mg | ORAL_TABLET | Freq: Once | ORAL | Status: AC
  Administered 2019-04-18: 5 mg
  Filled 2019-04-18: qty 1

## 2019-04-18 MED ORDER — CHLORHEXIDINE GLUCONATE CLOTH 2 % EX PADS
6.0000 | MEDICATED_PAD | Freq: Every day | CUTANEOUS | Status: DC
  Administered 2019-04-19 – 2019-04-26 (×6): 6 via TOPICAL

## 2019-04-18 MED ORDER — BUDESONIDE 0.5 MG/2ML IN SUSP
0.5000 mg | Freq: Two times a day (BID) | RESPIRATORY_TRACT | Status: DC
  Administered 2019-04-18 – 2019-04-26 (×16): 0.5 mg via RESPIRATORY_TRACT
  Filled 2019-04-18 (×16): qty 2

## 2019-04-18 MED ORDER — ORAL CARE MOUTH RINSE
15.0000 mL | Freq: Two times a day (BID) | OROMUCOSAL | Status: DC
  Administered 2019-04-18 – 2019-04-25 (×11): 15 mL via OROMUCOSAL

## 2019-04-18 MED ORDER — SODIUM CHLORIDE 0.9% FLUSH
10.0000 mL | INTRAVENOUS | Status: DC | PRN
  Administered 2019-04-26: 10 mL

## 2019-04-18 MED ORDER — CHLORHEXIDINE GLUCONATE 0.12 % MT SOLN
15.0000 mL | Freq: Two times a day (BID) | OROMUCOSAL | Status: DC
  Administered 2019-04-18 – 2019-04-26 (×16): 15 mL via OROMUCOSAL
  Filled 2019-04-18 (×14): qty 15

## 2019-04-18 MED FILL — Sodium Bicarbonate IV Soln 8.4%: INTRAVENOUS | Qty: 50 | Status: AC

## 2019-04-18 MED FILL — Mannitol IV Soln 20%: INTRAVENOUS | Qty: 500 | Status: AC

## 2019-04-18 MED FILL — Electrolyte-R (PH 7.4) Solution: INTRAVENOUS | Qty: 4000 | Status: AC

## 2019-04-18 MED FILL — Heparin Sodium (Porcine) Inj 1000 Unit/ML: INTRAMUSCULAR | Qty: 10 | Status: AC

## 2019-04-18 MED FILL — Calcium Chloride Inj 10%: INTRAVENOUS | Qty: 10 | Status: AC

## 2019-04-18 MED FILL — Potassium Chloride Inj 2 mEq/ML: INTRAVENOUS | Qty: 40 | Status: AC

## 2019-04-18 MED FILL — Sodium Chloride IV Soln 0.9%: INTRAVENOUS | Qty: 2000 | Status: AC

## 2019-04-18 MED FILL — Lidocaine HCl Local Preservative Free (PF) Inj 2%: INTRAMUSCULAR | Qty: 15 | Status: AC

## 2019-04-18 MED FILL — Heparin Sodium (Porcine) Inj 1000 Unit/ML: INTRAMUSCULAR | Qty: 30 | Status: AC

## 2019-04-18 NOTE — Progress Notes (Signed)
Patient ID: Rebecca Carr, female   DOB: [birth year], 69 y.o.   MRN: [id number] EVENING ROUNDS NOTE :     [address]       [place] [address]             [phone]                 5 Days Post-Op Procedure(s) (LRB): MITRAL VALVE REPAIR (MVR) using Memo 4D 34 MM Mitral Valve. (N/A) TRANSESOPHAGEAL ECHOCARDIOGRAM (TEE) (N/A) Clipping Of Atrial Appendage using AtriCure 45 MM AtriClip. (N/A)  Total Length of Stay:  LOS: 8 days  BP (!) 101/48   Pulse (!) 111   Temp 98 F (36.7 C) (Oral)   Resp 18   Ht 5\' 8"  (1.727 m)   Wt 102.5 kg   SpO2 98%   BMI 34.36 kg/m   .Intake/Output      03/09 0701 - 03/10 0700 03/10 0701 - 03/11 0700   P.O.  100   I.V. (mL/kg) 2017.8 (19.7) 1516.6 (14.8)   NG/GT 975 190   IV Piggyback 550.1 100   Total Intake(mL/kg) 3542.9 (34.6) 1906.6 (18.6)   Urine (mL/kg/hr) 7440 (3) 1900 (1.6)   Other     Chest Tube 235 40   Total Output 7675 1940   Net -4132.1 -33.4          . sodium chloride    . sodium chloride    . amiodarone 30 mg/hr (04/18/19 1800)  . ceFEPime (MAXIPIME) IV Stopped (04/18/19 1440)  . dexmedetomidine (PRECEDEX) IV infusion Stopped (04/18/19 1131)  . feeding supplement (VITAL AF 1.2 CAL) Stopped (04/18/19 0800)  . furosemide (LASIX) infusion 15 mg/hr (04/18/19 1800)  . lactated ringers 20 mL/hr at 04/16/19 0438  . milrinone 0.2 mcg/kg/min (04/18/19 1800)  . norepinephrine (LEVOPHED) Adult infusion Stopped (04/18/19 1455)     Lab Results  Component Value Date   WBC 15.2 (H) 04/18/2019   HGB 8.5 (L) 04/18/2019   HCT 25.0 (L) 04/18/2019   PLT 125 (L) 04/18/2019   GLUCOSE 139 (H) 04/18/2019   ALT 24 04/17/2019   AST 27 04/17/2019   NA 131 (L) 04/18/2019   K 4.4 04/18/2019   CL 86 (L) 04/18/2019   CREATININE 1.09 (H) 04/18/2019   BUN 37 (H) 04/18/2019   CO2 32 04/18/2019   TSH 5.506 (H) 04/16/2019   INR 1.5 (H) 04/13/2019   HGBA1C 6.3 (H) 04/12/2019   Awake and neuro intact afib On milrinone,  amiodarone and lasix     Grace Isaac MD  [facility]eeper [phone] Office [phone] 04/18/2019 6:36 PM

## 2019-04-18 NOTE — Procedures (Signed)
Extubation Procedure Note  Patient Details:   Name: Rebecca Carr DOB: 12/28/1950 MRN: [id number]   Airway Documentation:    Vent end date: 04/14/19 Vent end time: 1120   Evaluation  O2 sats: stable throughout Complications: No apparent complications Patient did tolerate procedure well. Bilateral Breath Sounds: Clear   Yes   Patient extubated to bipap per MD order.  Positive cuff leak noted.  No evidence of stridor.  Patient able to speak post extubation.  Patient tolerating bipap well at this time.  Sats and vitals are currently stable.  Will continue to monitor.   Judith Part 04/18/2019, 11:39 AM

## 2019-04-18 NOTE — Progress Notes (Signed)
Peripherally Inserted Central Catheter Placement  The IV Nurse has discussed with the patient and/or persons authorized to consent for the patient, the purpose of this procedure and the potential benefits and risks involved with this procedure.  The benefits include less needle sticks, lab draws from the catheter, and the patient may be discharged home with the catheter. Risks include, but not limited to, infection, bleeding, blood clot (thrombus formation), and puncture of an artery; nerve damage and irregular heartbeat and possibility to perform a PICC exchange if needed/ordered by physician.  Alternatives to this procedure were also discussed.  Bard Power PICC patient education guide, fact sheet on infection prevention and patient information card has been provided to patient /or left at bedside.    PICC/Midline Placement Documentation  PICC Triple Lumen [id number] PICC Right Basilic 36 cm 0 cm (Active)  Indication for Insertion or Continuance of Line Vasoactive infusions 04/18/19 1102  Exposed Catheter (cm) 0 cm 04/18/19 1102  Site Assessment Clean;Dry;Intact 04/18/19 1102  Lumen #1 Status Flushed;Saline locked;Blood return noted 04/18/19 1102  Lumen #2 Status Flushed;Saline locked;Blood return noted 04/18/19 1102  Lumen #3 Status Flushed;Saline locked;Blood return noted 04/18/19 1102  Dressing Type Transparent;Securing device 04/18/19 1102  Dressing Status Clean;Dry;Intact;Antimicrobial disc in place 04/18/19 1102  Dressing Intervention New dressing 04/18/19 1102  Dressing Change Due 04/25/19 04/18/19 [place] 04/18/2019, 11:05 AM

## 2019-04-18 NOTE — Progress Notes (Addendum)
TCTS DAILY ICU PROGRESS NOTE                   [address]            [place] [address]          [phone]   5 Days Post-Op Procedure(s) (LRB): MITRAL VALVE REPAIR (MVR) using Memo 4D 34 MM Mitral Valve. (N/A) TRANSESOPHAGEAL ECHOCARDIOGRAM (TEE) (N/A) Clipping Of Atrial Appendage using AtriCure 45 MM AtriClip. (N/A)  Total Length of Stay:  LOS: 8 days   Subjective: Awake and alert on the vent.   Objective: Vital signs in last 24 hours: Temp:  [98 F (36.7 C)-100.8 F (38.2 C)] 100.8 F (38.2 C) (03/10 0730) Pulse Rate:  [89-104] 94 (03/10 0413) Cardiac Rhythm: Normal sinus rhythm (03/10 0400) Resp:  [11-22] 21 (03/10 0730) BP: (81-123)/(53-78) 101/72 (03/10 0730) SpO2:  [93 %-99 %] 95 % (03/10 0730) Arterial Line BP: (65-126)/(50-67) 65/60 (03/10 0730) FiO2 (%):  [40 %] 40 % (03/10 0413) Weight:  [102.5 kg] 102.5 kg (03/10 0403)  Filed Weights   04/16/19 0500 04/17/19 0500 04/18/19 0403  Weight: 107.5 kg 106.9 kg 102.5 kg    Weight change: -4.4 kg   Hemodynamic parameters for last 24 hours: CVP:  [7 mmHg-16 mmHg] 15 mmHg  Intake/Output from previous day: 03/09 0701 - 03/10 0700 In: 3542.9 [I.V.:2017.8; NG/GT:975; IV Piggyback:550.1] Out: 5110 [YTRZN:3567; Chest Tube:235]  Intake/Output this shift: No intake/output data recorded.  Current Meds: Scheduled Meds: . acetaminophen  1,000 mg Oral Q6H   Or  . acetaminophen (TYLENOL) oral liquid 160 mg/5 mL  1,000 mg Per Tube Q6H  . bisacodyl  10 mg Rectal Daily  . chlorhexidine gluconate (MEDLINE KIT)  15 mL Mouth Rinse BID  . Chlorhexidine Gluconate Cloth  6 each Topical Daily  . docusate  200 mg Per Tube Daily  . feeding supplement (PRO-STAT SUGAR FREE 64)  30 mL Per Tube TID  . insulin aspart  0-24 Units Subcutaneous Q4H  . levothyroxine  125 mcg Per Tube QAC breakfast  . liothyronine  5 mcg Per Tube Daily  . mouth rinse  15 mL Mouth Rinse 10 times per day  . pantoprazole (PROTONIX) IV   40 mg Intravenous QHS  . sodium chloride flush  10-40 mL Intracatheter Q12H  . sodium chloride flush  3 mL Intravenous Q12H  . spironolactone  25 mg Oral Daily   Continuous Infusions: . sodium chloride    . sodium chloride    . amiodarone 30 mg/hr (04/18/19 0700)  . ceFEPime (MAXIPIME) IV Stopped (04/18/19 0655)  . dexmedetomidine (PRECEDEX) IV infusion 0.8 mcg/kg/hr (04/18/19 0700)  . feeding supplement (VITAL AF 1.2 CAL) 1,000 mL (04/17/19 1130)  . fentaNYL infusion INTRAVENOUS 125 mcg/hr (04/18/19 0700)  . furosemide (LASIX) infusion 15 mg/hr (04/18/19 0810)  . lactated ringers 20 mL/hr at 04/16/19 0438  . milrinone 0.2 mcg/kg/min (04/18/19 0700)  . norepinephrine (LEVOPHED) Adult infusion 7 mcg/min (04/18/19 0700)  . vancomycin Stopped (04/18/19 0139)   PRN Meds:.sodium chloride, bisacodyl, fentaNYL, levalbuterol, metoprolol tartrate, midazolam, morphine injection, ondansetron (ZOFRAN) IV, sodium chloride flush, sodium chloride flush  General appearance: alert, cooperative and no distress Heart: sinus tachycardia/afib Lungs: clear to auscultation bilaterally and +chest tube rub Abdomen: soft, non-tender; bowel sounds normal; no masses,  no organomegaly Extremities: extremities normal, atraumatic, no cyanosis or edema Wound: clean and dry  Lab Results: CBC: Recent Labs    04/17/19 0445 04/17/19 0445 04/18/19 0355 04/18/19  0640  WBC 14.7*  --  15.2*  --   HGB 8.6*   < > 8.4* 8.5*  HCT 27.8*   < > 26.9* 25.0*  PLT 88*  --  125*  --    < > = values in this interval not displayed.   BMET:  Recent Labs    04/17/19 1500 04/17/19 1500 04/18/19 0355 04/18/19 0640  NA 133*   < > 133* 129*  K 4.5   < > 4.5 4.5  CL 94*  --  89*  --   CO2 28  --  30  --   GLUCOSE 152*  --  130*  --   BUN 30*  --  36*  --   CREATININE 1.01*  --  1.08*  --   CALCIUM 8.3*  --  8.5*  --    < > = values in this interval not displayed.    CMET: Lab Results  Component Value Date   WBC  15.2 (H) 04/18/2019   HGB 8.5 (L) 04/18/2019   HCT 25.0 (L) 04/18/2019   PLT 125 (L) 04/18/2019   GLUCOSE 130 (H) 04/18/2019   ALT 24 04/17/2019   AST 27 04/17/2019   NA 129 (L) 04/18/2019   K 4.5 04/18/2019   CL 89 (L) 04/18/2019   CREATININE 1.08 (H) 04/18/2019   BUN 36 (H) 04/18/2019   CO2 30 04/18/2019   TSH 5.506 (H) 04/16/2019   INR 1.5 (H) 04/13/2019   HGBA1C 6.3 (H) 04/12/2019      PT/INR: No results for input(s): LABPROT, INR in the last 72 hours. Radiology: DG Abd 1 View  Result Date: 04/17/2019 CLINICAL DATA:  Orogastric tube placement EXAM: ABDOMEN - 1 VIEW COMPARISON:  Two days ago FINDINGS: Orogastric tube with tip at the distal stomach. Cholecystectomy clips. Normal bowel gas pattern. No abnormal stool retention. Extensive artifact from support apparatus. IMPRESSION: 1. Orogastric tube tip is at the distal stomach. 2. Normal bowel gas pattern. Electronically Signed   By: Monte Fantasia M.D.   On: 04/17/2019 09:43   [place] EKG SITE RITE  Result Date: 04/17/2019 If Site Rite image not attached, placement could not be confirmed due to current cardiac rhythm.    Assessment/Plan: S/P Procedure(s) (LRB): MITRAL VALVE REPAIR (MVR) using Memo 4D 34 MM Mitral Valve. (N/A) TRANSESOPHAGEAL ECHOCARDIOGRAM (TEE) (N/A) Clipping Of Atrial Appendage using AtriCure 45 MM AtriClip. (N/A)  1. CV-Afib rate 110s-120s. Continue Amio and milrinone, lasix gtt, and Levo. Weaning Levo as tolerated.  2. Pulm-VDRF due to acute hypercarbic resp failure after initial extubation w/ severe COPD and acute diastolic CHF-remains on the ventilator FiO2 of 40%. CXR reviewed and left lung with improved aeration.  3. Renal-creatinine 1.08, electrolytes okay potassium 4.5. Good urine output. Almost at baseline weight. Could decrease lasix drip 4. H and H 8.5/25.0, expected acute blood loss anemia 5. Endo-blood glucose well controlled 6. Continue empiric Antibiotics for opacity in the left lung base  (Vanco and Maxipime). Pharmacy consulted.  7. + bowel sounds but no bowel movement since surgery. TF at 67m/hr today per nutrition. 38mProstat TID  Plan: Working on weaning this morning. Awake and alert on the vent. Continue chest tubes for now. Consider decreasing lasix gtt since the patient is close to her baseline weight. Continue to wean Levo.       TeElgie Carr/11/2019 8:28 AM    I have seen and examined the patient and agree with the assessment and plan as outlined.  Looks better.  Diuresing well on lasix drip.  I/O's negative 4 liters and weight down 4.4 kg yesterday.  CXR clear.  I favor proceeding with acute vent wean and possible extubation today.  Will hold tube feeds.   Rebecca Alberts, MD 04/18/2019 9:12 AM

## 2019-04-18 NOTE — Progress Notes (Addendum)
NAME:  Rebecca Carr, MRN:  [id number], DOB:  1950/03/25, LOS: 8 ADMISSION DATE:  04/10/2019, CONSULTATION DATE: 3/6  REFERRING MD:  Darcey Nora, CHIEF COMPLAINT: Acute on chronic respiratory failure  Brief History   69 year old female with chronic respiratory failure 2/2 COPD, underlying history of severe mitral valve disease and pulmonary hypertension.  Underwent mitral valve repair on 3/5, extubated on 3/6 but developed progressive somnolence and hypercarbia therefore pulmonary asked to evaluate and assist with care.  Past Medical History  Severe mitral valve prolapse Chronic diastolic heart failure Pulmonary hypertension Antiphospholipid syndrome Arthritis GERD Hashimoto's thyroiditis, hypothyroidism. COPD Chronic respiratory failure OSA  Significant Hospital Events   3/04 admitted 3/05 underwent mitral valve repair 3/06 Developed progressive hypercarbia post extubation pulmonary asked to evaluate.  Intubated. 3/07 Hemodynamically stable.  PA pressures improved post intubation with PA systolics almost immediately dropping from 70s down to 40s 3/09 FiO2 weaned to 40% 3/10 PSV wean 8/5  Consults:  Thoracic surgery Advanced heart failure team Cardiology  Procedures:  Right IJ pulmonary artery catheter 3/5 >> 3/9 ETT 3/6 >> CT's 3/5 >>   Significant Diagnostic Tests:    Micro Data:  Mitral Valve Tissue Culture 3/5 >> no organisms  Antimicrobials:  Ertapenem 3/4 >> 3/7 Vancomycin 3/5 >> 3/6  Cefepime 3/8 >>  Vanco 3/8 >> 3/10  Interim history/subjective:  RN reports pt weaning on PSV 8/5, in AF with intermittent RVR Tmax 100.8 / WBC 15.2  I/O - 7.4L UOP, -4.1L in 24h. 3/2 weight 108.9kg, now 102.5 but still with edema  Glucose range 120 - 147 Remains on amiodarone, milrinone, precedex   Objective   Blood pressure 101/72, pulse 94, temperature (!) 100.8 F (38.2 C), temperature source Oral, resp. rate (!) 21, height 5\' 8"  (1.727 m), weight 102.5 kg, SpO2 95  %. CVP:  [7 mmHg-16 mmHg] 15 mmHg  Vent Mode: PRVC FiO2 (%):  [40 %] 40 % Set Rate:  [15 bmp] 15 bmp Vt Set:  [510 mL] 510 mL PEEP:  [5 cmH20] 5 cmH20 Plateau Pressure:  [23 cmH20-25 cmH20] 23 cmH20   Intake/Output Summary (Last 24 hours) at 04/18/2019 0815 Last data filed at 04/18/2019 0700 Gross per 24 hour  Intake 3477.18 ml  Output 7675 ml  Net -4197.82 ml   Filed Weights   04/16/19 0500 04/17/19 0500 04/18/19 0403  Weight: 107.5 kg 106.9 kg 102.5 kg    Examination: General: critically ill appearing adult female lying in bed on vent  HEENT: MM pink/moist, ETT, diaphoretic, anicteric, pupils =/reactive  Neuro: Awake, alert, follows commands, nods CV: s1s2 irr irr, AF on monitor 90-140's, no m/r/g PULM:  Non-labored on vent, lungs bilaterally with scattered wheeze GI: soft, bsx4 active  Extremities: warm/dry, BLE 1-2+ pitting edema  Skin: no rashes or lesions  Resolved Hospital Problem list     Assessment & Plan:   Acute on Chronic Hypercarbic Hypoxemic Respiratory Failure LLL Atelectasis vs Infiltrate  OSA Suspect mixed picture of decreased ventilatory effort s/p cardio-thoracic surgery superimposed on underlying restrictive lung disease, +/- emphysema, residual sedation effects and pulmonary edema. PA pressures significantly improved with addition of nitric oxide.  LLL [place] on 3/8 with small effusion and atelectasis.  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 Emphysema Pulmonary Edema Acute Diastolic Heart Failure Cardiogenic shock AFwRVR ECHO 03/2019 with LVEF 60-65%, Grade II DD -post operative care per  TCTS -vasopressors / inotrope support per primary  -continue lasix gtt -bolus amiodarone x1 now, continue gtt   Sedation Needs in setting of Mechanical Ventilation  -PAD protocol with precedex, fentantyl  -RASS goal 0 to -1   Hypokalemia / Hyperkalemia  Hypomagnesemia  Hyponatremia -continue spironolactone  -follow electrolytes, replace as indicated  Postoperative Anemia  Thrombocytopenia  No evidence of bleeding, platelets improving -trend CBC  -transfuse for Hgb <8%  Hyperglycemia  -SSI   Hypothyroidism -synthroid   Hx Lupus, Sjogren's Disease Followed at [facility] Rheumat[facility]. Recent neck/jaw pain may be related to Sjogren's, CT neck unremarkable. Treated with 3 days abx.  Hx Antiphospholipid Syndrome  Not on anticoagulation prior to admit -defer to primary / post-operative   Best practice:  Diet: NPO, TF Pain/Anxiety/Delirium protocol (if indicated): in place VAP protocol (if indicated): in plcae DVT prophylaxis:   Per primary  GI prophylaxis: PPI Glucose control: SSI Mobility: BR Code Status: full code  Family Communication: per primary  Disposition: ICU    Critical care time: 21 minutes    Noe Gens, MSN, NP-C [facility] 04/18/2019, 8:15 AM   Please see [email or web address] for pager details.   Attending:  This is a 69 year old female chronic hypoxemic respiratory failure COPD at baseline with severe mitral valve disease and pulmonary hypertension taken for mitral valve repair on 3 5 was extubated on 3 6 became progressively hypercarbic and reintubated on mechanical support.  Decision was made for better optimization of COPD management and heart failure.  Patient has done well on pressure support CPAP SBT SAT trial this morning.  Discussed case with cardiovascular surgery.  Decision made for liberation from mechanical support.  For today.  BP (!) 101/48   Pulse (!) 111   Temp 98 F (36.7 C) (Oral)   Resp 18   Ht 5\' 8"  (1.727 m)   Wt  102.5 kg   SpO2 98%   BMI 34.36 kg/m   General: Elderly female resting in bed on mechanical life support HEENT: Trachea midlineTracking appropriately Heart: Rate rhythm, S1-S2 Lungs: Bilateral mechanical ventilated breath sounds Abdomen: Soft nontender nondistended  7 L urine output since yesterday  Labs: Reviewed  Chest x-ray: Enlarged cardiac silhouette, small effusion no significant infiltrate.  Assessment: Acute on chronic hypoxemic respiratory failure, baseline COPD Mitral valve stenosis status post repair, severe pulmonary hypertension Acute pulmonary edema, acute on chronic diastolic heart failure Cardiogenic shock, remains on inotropes and vasopressors. Atrial fibrillation with RVR, still not rate controlled  Plan: Patient is able to tolerate SBT at this time. Still has some volume it up from her dry weight. This was discussed with cardiology and cardiovascular surgery. We will give additional diuresis Change some nebulized COPD medications today. She still looks okay after a couple of hours on SBT I think we can liberate to BiPAP. She will need to remain in ICU for close hemodynamic respiratory support as well as ongoing diuresis and heart failure management. Patient was given an additional bolus of amiodarone today.  This patient is critically ill with multiple organ system failure; which, requires frequent high complexity decision making, assessment, support, evaluation, and titration of therapies. This was completed through the application of advanced monitoring technologies and extensive interpretation of multiple databases. During this encounter critical care  time was devoted to patient care services described in this note for 34 minutes.  [facility] 04/18/2019 5:41 PM

## 2019-04-19 ENCOUNTER — Inpatient Hospital Stay (HOSPITAL_COMMUNITY): Payer: Medicare Other

## 2019-04-19 ENCOUNTER — Ambulatory Visit: Payer: Medicare Other | Admitting: Cardiology

## 2019-04-19 LAB — POCT I-STAT 7, (LYTES, BLD GAS, ICA,H+H)
Acid-Base Excess: 14 mmol/L — ABNORMAL HIGH (ref 0.0–2.0)
Bicarbonate: 41.4 mmol/L — ABNORMAL HIGH (ref 20.0–28.0)
Calcium, Ion: 1.14 mmol/L — ABNORMAL LOW (ref 1.15–1.40)
HCT: 30 % — ABNORMAL LOW (ref 36.0–46.0)
Hemoglobin: 10.2 g/dL — ABNORMAL LOW (ref 12.0–15.0)
O2 Saturation: 98 %
Potassium: 3.9 mmol/L (ref 3.5–5.1)
Sodium: 131 mmol/L — ABNORMAL LOW (ref 135–145)
TCO2: 43 mmol/L — ABNORMAL HIGH (ref 22–32)
pCO2 arterial: 69.1 mmHg (ref 32.0–48.0)
pH, Arterial: 7.385 (ref 7.350–7.450)
pO2, Arterial: 107 mmHg (ref 83.0–108.0)

## 2019-04-19 LAB — GLUCOSE, CAPILLARY
Glucose-Capillary: 113 mg/dL — ABNORMAL HIGH (ref 70–99)
Glucose-Capillary: 128 mg/dL — ABNORMAL HIGH (ref 70–99)
Glucose-Capillary: 133 mg/dL — ABNORMAL HIGH (ref 70–99)
Glucose-Capillary: 136 mg/dL — ABNORMAL HIGH (ref 70–99)
Glucose-Capillary: 164 mg/dL — ABNORMAL HIGH (ref 70–99)
Glucose-Capillary: 180 mg/dL — ABNORMAL HIGH (ref 70–99)

## 2019-04-19 LAB — BASIC METABOLIC PANEL
Anion gap: 16 — ABNORMAL HIGH (ref 5–15)
BUN: 38 mg/dL — ABNORMAL HIGH (ref 8–23)
CO2: 32 mmol/L (ref 22–32)
Calcium: 8.5 mg/dL — ABNORMAL LOW (ref 8.9–10.3)
Chloride: 87 mmol/L — ABNORMAL LOW (ref 98–111)
Creatinine, Ser: 1.07 mg/dL — ABNORMAL HIGH (ref 0.44–1.00)
GFR calc Af Amer: >60 mL/min (ref >60)
GFR calc non Af Amer: 53 mL/min — ABNORMAL LOW (ref >60)
Glucose, Bld: 170 mg/dL — ABNORMAL HIGH (ref 70–99)
Potassium: 3.9 mmol/L (ref 3.5–5.1)
Sodium: 135 mmol/L (ref 135–145)

## 2019-04-19 LAB — CBC
HCT: 29.8 % — ABNORMAL LOW (ref 36.0–46.0)
Hemoglobin: 9.2 g/dL — ABNORMAL LOW (ref 12.0–15.0)
MCH: 28.7 pg (ref 26.0–34.0)
MCHC: 30.9 g/dL (ref 30.0–36.0)
MCV: 92.8 fL (ref 80.0–100.0)
Platelets: 161 10*3/uL (ref 150–400)
RBC: 3.21 MIL/uL — ABNORMAL LOW (ref 3.87–5.11)
RDW: 13.3 % (ref 11.5–15.5)
WBC: 15.7 10*3/uL — ABNORMAL HIGH (ref 4.0–10.5)
nRBC: 0 % (ref 0.0–0.2)

## 2019-04-19 MED ORDER — ADULT MULTIVITAMIN W/MINERALS CH: 1.0000 | ORAL_TABLET | Freq: Every day | ORAL | Status: DC

## 2019-04-19 MED ORDER — PANTOPRAZOLE SODIUM 40 MG PO TBEC
40.0000 mg | DELAYED_RELEASE_TABLET | Freq: Every day | ORAL | Status: DC
  Administered 2019-04-19 – 2019-04-26 (×8): 40 mg via ORAL
  Filled 2019-04-19 (×8): qty 1

## 2019-04-19 MED ORDER — DOCUSATE SODIUM 100 MG PO CAPS
200.0000 mg | ORAL_CAPSULE | Freq: Every day | ORAL | Status: DC
  Administered 2019-04-19 – 2019-04-25 (×7): 200 mg via ORAL
  Filled 2019-04-19 (×8): qty 2

## 2019-04-19 MED ORDER — LIOTHYRONINE SODIUM 5 MCG PO TABS
5.0000 ug | ORAL_TABLET | Freq: Every day | ORAL | Status: DC
  Administered 2019-04-19 – 2019-04-26 (×8): 5 ug via ORAL
  Filled 2019-04-19 (×8): qty 1

## 2019-04-19 MED ORDER — MORPHINE SULFATE (PF) 2 MG/ML IV SOLN
1.0000 mg | INTRAVENOUS | Status: DC | PRN
  Administered 2019-04-19 (×2): 1 mg via INTRAVENOUS
  Filled 2019-04-19 (×2): qty 1

## 2019-04-19 MED ORDER — WARFARIN SODIUM 2.5 MG PO TABS
2.5000 mg | ORAL_TABLET | Freq: Every day | ORAL | Status: DC
  Administered 2019-04-19 – 2019-04-24 (×6): 2.5 mg via ORAL
  Filled 2019-04-19 (×7): qty 1

## 2019-04-19 MED ORDER — WARFARIN - PHYSICIAN DOSING INPATIENT: Freq: Every day | Status: DC

## 2019-04-19 MED ORDER — ACETAMINOPHEN 325 MG PO TABS: 650.0000 mg | ORAL_TABLET | Freq: Four times a day (QID) | ORAL | Status: DC | PRN

## 2019-04-19 MED ORDER — SENNOSIDES-DOCUSATE SODIUM 8.6-50 MG PO TABS
1.0000 | ORAL_TABLET | Freq: Two times a day (BID) | ORAL | Status: DC
  Administered 2019-04-19 – 2019-04-25 (×14): 1 via ORAL
  Filled 2019-04-19 (×13): qty 1

## 2019-04-19 MED ORDER — SORBITOL 70 % SOLN
30.0000 mL | Freq: Once | Status: AC
  Administered 2019-04-19: 30 mL via ORAL
  Filled 2019-04-19: qty 30

## 2019-04-19 MED ORDER — BOOST / RESOURCE BREEZE PO LIQD CUSTOM
1.0000 | Freq: Three times a day (TID) | ORAL | Status: DC
  Administered 2019-04-19 – 2019-04-25 (×8): 1 via ORAL

## 2019-04-19 MED ORDER — INSULIN ASPART 100 UNIT/ML ~~LOC~~ SOLN
0.0000 [IU] | Freq: Three times a day (TID) | SUBCUTANEOUS | Status: DC
  Administered 2019-04-19: 2 [IU] via SUBCUTANEOUS
  Administered 2019-04-19: 4 [IU] via SUBCUTANEOUS
  Administered 2019-04-20: 2 [IU] via SUBCUTANEOUS

## 2019-04-19 MED ORDER — POTASSIUM CHLORIDE CRYS ER 20 MEQ PO TBCR
30.0000 meq | EXTENDED_RELEASE_TABLET | Freq: Once | ORAL | Status: AC
  Administered 2019-04-19: 30 meq via ORAL
  Filled 2019-04-19: qty 1

## 2019-04-19 MED ORDER — METOCLOPRAMIDE HCL 5 MG/ML IJ SOLN
5.0000 mg | Freq: Three times a day (TID) | INTRAMUSCULAR | Status: AC
  Administered 2019-04-19 (×2): 5 mg via INTRAVENOUS
  Filled 2019-04-19 (×2): qty 2

## 2019-04-19 MED ORDER — LEVOTHYROXINE SODIUM 25 MCG PO TABS
125.0000 ug | ORAL_TABLET | Freq: Every day | ORAL | Status: DC
  Administered 2019-04-20 – 2019-04-26 (×7): 125 ug via ORAL
  Filled 2019-04-19 (×6): qty 1

## 2019-04-19 MED ORDER — ~~LOC~~ CARDIAC SURGERY, PATIENT & FAMILY EDUCATION: Freq: Once | Status: DC

## 2019-04-19 MED ORDER — MELATONIN 3 MG PO TABS
3.0000 mg | ORAL_TABLET | Freq: Every evening | ORAL | Status: DC | PRN
  Administered 2019-04-19 – 2019-04-25 (×3): 3 mg via ORAL
  Filled 2019-04-19 (×5): qty 1

## 2019-04-19 MED ORDER — LEVALBUTEROL HCL 1.25 MG/0.5ML IN NEBU
1.2500 mg | INHALATION_SOLUTION | Freq: Four times a day (QID) | RESPIRATORY_TRACT | Status: DC
  Administered 2019-04-19 (×3): 1.25 mg via RESPIRATORY_TRACT
  Filled 2019-04-19 (×3): qty 0.5

## 2019-04-19 NOTE — Progress Notes (Addendum)
Nutrition Follow up   DOCUMENTATION CODES:   Not applicable  INTERVENTION:     Boost Breeze po TID, each supplement provides 250 kcal and 9 grams of protein  NUTRITION DIAGNOSIS:   Increased nutrient needs related to post-op healing as evidenced by estimated needs.  Ongoing  GOAL:   Patient will meet greater than or equal to 90% of their needs   Diet just advanced   MONITOR:   Diet advancement, Vent status, Skin, TF tolerance, Weight trends, Labs, I & O's  REASON FOR ASSESSMENT:   Consult Enteral/tube feeding initiation and management  ASSESSMENT:   Patient with PMH significant for severe mitral valve prolapse, CHF, pulmonary HTN, antiphospholipid syndrome, COPD, and OSA. Presents this admission for surgery.   3/5- s/p MVR 3/6- failed extubation, re-intubated  3/10- extubated   Pt discussed during ICU rounds and with RN.   Off pressors. Continues to diurese. Has not had BM in a week despite aggressive regimen. Will continue to monitor for results. Discussed the importance of protein intake to promote post-op healing. Pt willing to try supplementation.   PTA- pt denies having loss of appetite. Typically eats 2-3 meals daily. Denies weight loss.   Admission weight: 102.3 kg  Current weight: 98.8 kg   I/O: -9,358 ml since admit  UOP: 3,840 ml x 24 hrs Chest tube: 140 ml x 24 hrs    Drips: 250 mg lasix in D5 @ 10 ml/hr  Medications: dulcolax, colace, SS novolog, reglan, senokot, aldactone Labs: CBG 120-164  Diet Order:   Diet Order            Diet clear liquid Room service appropriate? Yes; Fluid consistency: Thin  Diet effective now              EDUCATION NEEDS:   Not appropriate for education at this time  Skin:  Skin Assessment: Skin Integrity Issues: Skin Integrity Issues:: Incisions Incisions: sternum  Last BM:  3/4  Height:   Ht Readings from Last 1 Encounters:  04/11/19 5\' 8"  (1.727 m)    Weight:   Wt Readings from Last 1  Encounters:  04/19/19 98.8 kg    Ideal Body Weight:  63.6 kg  BMI:  Body mass index is 33.12 kg/m.  Estimated Nutritional Needs:   Kcal:  1800-2000 kcal  Protein:  90-105 grams  Fluid:  >/= 1.8 L/day  Mariana Single RD, LDN Clinical Nutrition Pager listed in [place]

## 2019-04-19 NOTE — Progress Notes (Addendum)
NAME:  Rebecca Carr, MRN:  [id number], DOB:  20-Mar-1950, LOS: 9 ADMISSION DATE:  04/10/2019, CONSULTATION DATE: 3/6  REFERRING MD:  Darcey Nora, CHIEF COMPLAINT: Acute on chronic respiratory failure  Brief History   69 year old female with chronic respiratory failure 2/2 COPD, underlying history of severe mitral valve disease and pulmonary hypertension.  Underwent mitral valve repair on 3/5, extubated on 3/6 but developed progressive somnolence and hypercarbia therefore pulmonary asked to evaluate and assist with care.  Past Medical History  Severe mitral valve prolapse Chronic diastolic heart failure Pulmonary hypertension Antiphospholipid syndrome Arthritis GERD Hashimoto's thyroiditis, hypothyroidism. COPD Chronic respiratory failure OSA  Significant Hospital Events   3/04 admitted 3/05 underwent mitral valve repair 3/06 Developed progressive hypercarbia post extubation pulmonary asked to evaluate.  Intubated. 3/07 Hemodynamically stable.  PA pressures improved post intubation with PA systolics almost immediately dropping from 70s down to 40s 3/09 FiO2 weaned to 40% 3/10 PSV wean 8/5  Consults:  Thoracic surgery Advanced heart failure team Cardiology  Procedures:  Right IJ pulmonary artery catheter 3/5 >> 3/9 ETT 3/6 >> CT's 3/5 >>   Significant Diagnostic Tests:    Micro Data:  Mitral Valve Tissue Culture 3/5 >> no organisms Tracheal aspirate 3/8 >> negative  BCx2 3/8 >>   Antimicrobials:  Ertapenem 3/4 >> 3/7 Vancomycin 3/5 >> 3/6  Cefepime 3/8 >> 3/12 (planned end date) Vanco 3/8 >> 3/10  Interim history/subjective:  Pt reports she "feels like sh*t".  States her back is hurting.  Denies shortness of breath.   O2 weaned to 2L Tmax  Glucose 113-170 I/O 3.8L, net -1.4L  Off milrinone, remains on amiodarone    Objective   Blood pressure 119/68, pulse (!) 120, temperature 97.8 F (36.6 C), temperature source Oral, resp. rate 11, height 5\' 8"  (1.727 m),  weight 98.8 kg, SpO2 92 %. CVP:  [8 mmHg-15 mmHg] 15 mmHg  Vent Mode: BIPAP FiO2 (%):  [28 %-40 %] 28 % Set Rate:  [10 bmp] 10 bmp PEEP:  [5 cmH20] 5 cmH20   Intake/Output Summary (Last 24 hours) at 04/19/2019 1113 Last data filed at 04/19/2019 1038 Gross per 24 hour  Intake 2238.58 ml  Output 3230 ml  Net -991.42 ml   Filed Weights   04/17/19 0500 04/18/19 0403 04/19/19 0400  Weight: 106.9 kg 102.5 kg 98.8 kg    Examination: General: Ill-appearing adult female lying in bed in no acute distress HEENT: MM pink/moist, [place] O2 2L, anicteric Neuro: AAO x4, MAE, generalized weakness CV: s1s2 RRR, no m/r/g, chest tubes in place, sternotomy incision clean / dry PULM: Nonlabored, clear anterior, no wheezing GI: soft, bsx4 active  Extremities: warm/dry, 1+ edema  Skin: no rashes or lesions  Resolved Hospital Problem list     Assessment & Plan:   Acute on Chronic Hypercarbic Hypoxemic Respiratory Failure LLL Atelectasis vs Infiltrate  OSA Suspect mixed picture of decreased ventilatory effort s/p cardio-thoracic surgery superimposed on underlying restrictive lung disease, +/- emphysema, residual sedation effects and pulmonary edema. PA pressures significantly improved with addition of nitric oxide.  LLL [place] on 3/8 with small effusion and atelectasis.  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 Pulmonary Edema Acute Diastolic Heart Failure Cardiogenic shock AFwRVR ECHO 03/2019 with LVEF 60-65%, Grade II DD -post operative care per  TCTS  -amiodarone per Cardiology  -diuretics per TCTS / Cardiology  -coumadin per pharmacy   Hypokalemia / Hyperkalemia  Hypomagnesemia  Hyponatremia -continue spironolactone  -follow electrolytes, replace as indicated    Postoperative Anemia  Thrombocytopenia  No evidence of bleeding, platelets improving -trend CBC  -transfuse for hgb <8%  Hyperglycemia  -SSI   Hypothyroidism -continue synthroid   Hx Lupus, Sjogren's Disease Followed at [facility] Rheumatology. Recent neck/jaw pain may be related to Sjogren's, CT neck unremarkable. Treated with 3 days abx.  Hx Antiphospholipid Syndrome  Not on anticoagulation prior to admit -per primary   Best practice:  Diet: heart healthy  Pain/Anxiety/Delirium protocol (if indicated): in place VAP protocol (if indicated): in plcae DVT prophylaxis:   Per primary  GI prophylaxis: PPI Glucose control: SSI Mobility: BR Code Status: full code  Family Communication: per primary  Disposition: ICU, per primary.     PCCM will sign off 3/11.  Please call back if new needs arise.    Critical care time: n/a     Noe Gens, MSN, NP-C [facility] 04/19/2019, 11:13 AM   PCCM attending:  69 year old female postop severe mitral valve disease status post repair was intubated and extubated yesterday.  Tolerating well post extubation.  BP (!) 124/34   Pulse (!) 104   Temp 97.6 F (36.4 C) (Oral)   Resp 17   Ht 5\' 8"  (1.727 m)   Wt 98.8 kg Comment: weighed twice, diuresed > 4 liters  SpO2 97%   BMI 33.12 kg/m   General: Elderly female, sitting up in chair no distress Heart: Regular rhythm S1-S2 Lungs: Diminished breath sounds bilaterally no crackles no wheeze  Labs: Reviewed  Assessment: Respiratory failure, acute on chronic, still requiring nasal cannula O2 supplementation Mitral valve repair severe pulmonary hypertension Acute on chronic diastolic heart failure Cardiogenic shock  Plan: Patient overall improving continue up in chair as much as tolerated Wean FiO2 as much as tolerated Heart failure regimen per cardiology service. We appreciate the consultation.  Patient tolerating well post extubation. Please reach out to  pulmonary service if there are any other needs. We will sign off at this time.  [facility] 04/19/2019 5:59 PM

## 2019-04-19 NOTE — Progress Notes (Signed)
Routine ABG drawn and called to E-link. Critical C02 values althought Ph is compensated.  Pt has been on Bipap but was taken off due to pt being nauseous and risk of aspiration.  Pt is alert and oriented. No distress noted at this time.

## 2019-04-19 NOTE — Plan of Care (Signed)
Stable during shift.  BP stable off levo. Remains in Afib 100-110s with occasional 120s. Amio gtt continues. Diuresing well on Lasix gtt, foley, urine output of 3.8 L/24 hr. CVP 13-15.   Sats stable on supplemental oxygen. Rested on BiPAP approx 2000-0300, removed and placed on HFNC 4 lpm d/t episode of nausea, responded to zofran IV. Encouraging IS/TCDB.  CT with  minimal output. Morphine prn x 2 for discomfort at CT sites/sternotomy incision.   Some anxiety but responded well to reassurance.   Problem: Education: Goal: Knowledge of General Education information will improve Description: Including pain rating scale, medication(s)/side effects and non-pharmacologic comfort measures Outcome: Progressing   Problem: Health Behavior/Discharge Planning: Goal: Ability to manage health-related needs will improve Outcome: Progressing   Problem: Clinical Measurements: Goal: Ability to maintain clinical measurements within normal limits will improve Outcome: Progressing Goal: Will remain free from infection Outcome: Progressing Goal: Respiratory complications will improve Outcome: Progressing Goal: Cardiovascular complication will be avoided Outcome: Progressing   Problem: Nutrition: Goal: Adequate nutrition will be maintained Outcome: Progressing   Problem: Coping: Goal: Level of anxiety will decrease Outcome: Progressing   Problem: Elimination: Goal: Will not experience complications related to urinary retention Outcome: Progressing   Problem: Safety: Goal: Ability to remain free from injury will improve Outcome: Progressing   Problem: Skin Integrity: Goal: Risk for impaired skin integrity will decrease Outcome: Progressing   Problem: Cardiac: Goal: Will achieve and/or maintain hemodynamic stability Outcome: Progressing   Problem: Clinical Measurements: Goal: Postoperative complications will be avoided or minimized Outcome: Progressing   Problem: Respiratory: Goal:  Respiratory status will improve Outcome: Progressing

## 2019-04-19 NOTE — Progress Notes (Addendum)
TCTS DAILY ICU PROGRESS NOTE                   [address]            [place] [address]          [phone]   6 Days Post-Op Procedure(s) (LRB): MITRAL VALVE REPAIR (MVR) using Memo 4D 34 MM Mitral Valve. (N/A) TRANSESOPHAGEAL ECHOCARDIOGRAM (TEE) (N/A) Clipping Of Atrial Appendage using AtriCure 45 MM AtriClip. (N/A)  Total Length of Stay:  LOS: 9 days   Subjective: Only complaint is her back this morning and some dry mouth.   Objective: Vital signs in last 24 hours: Temp:  [97.7 F (36.5 C)-98.1 F (36.7 C)] 97.9 F (36.6 C) (03/11 0400) Pulse Rate:  [110-120] 120 (03/11 0741) Cardiac Rhythm: Atrial fibrillation (03/11 0400) Resp:  [9-27] 15 (03/11 0800) BP: (93-145)/(29-133) 119/43 (03/11 0800) SpO2:  [93 %-100 %] 95 % (03/11 0800) Arterial Line BP: (86-120)/(45-59) 109/59 (03/10 1400) FiO2 (%):  [28 %-40 %] 28 % (03/11 0741) Weight:  [98.8 kg] 98.8 kg (03/11 0400)  Filed Weights   04/17/19 0500 04/18/19 0403 04/19/19 0400  Weight: 106.9 kg 102.5 kg 98.8 kg    Weight change: -3.7 kg   Hemodynamic parameters for last 24 hours: CVP:  [8 mmHg-16 mmHg] 15 mmHg  Intake/Output from previous day: 03/10 0701 - 03/11 0700 In: 2549.2 [P.O.:200; I.V.:1959.2; NG/GT:190; IV Piggyback:200] Out: 3980 [Urine:3840; Chest Tube:140]  Intake/Output this shift: No intake/output data recorded.  Current Meds: Scheduled Meds: . bisacodyl  10 mg Rectal Daily  . budesonide (PULMICORT) nebulizer solution  0.5 mg Nebulization BID  . chlorhexidine  15 mL Mouth Rinse BID  . Chlorhexidine Gluconate Cloth  6 each Topical Daily  . [place] [facility], Patient & Family Education   Does not apply Once  . docusate sodium  200 mg Oral Daily  . insulin aspart  0-24 Units Subcutaneous Q4H  . levalbuterol  1.25 mg Nebulization Q6H  . [START ON 04/20/2019] levothyroxine  125 mcg Oral QAC breakfast  . liothyronine  5 mcg Oral Daily  . mouth rinse  15 mL Mouth Rinse  q12n4p  . pantoprazole  40 mg Oral Daily  . sodium chloride flush  3 mL Intravenous Q12H  . spironolactone  25 mg Oral Daily  . warfarin  2.5 mg Oral q1800  . Warfarin - Physician Dosing Inpatient   Does not apply q1800   Continuous Infusions: . sodium chloride    . sodium chloride    . amiodarone 30 mg/hr (04/19/19 0700)  . ceFEPime (MAXIPIME) IV 2 g (04/19/19 0630)  . furosemide (LASIX) infusion 15 mg/hr (04/19/19 0700)   PRN Meds:.sodium chloride, bisacodyl, metoprolol tartrate, morphine injection, ondansetron (ZOFRAN) IV, sodium chloride flush, sodium chloride flush  General appearance: alert, cooperative and no distress Heart: regularly irregular rhythm Lungs: clear to auscultation bilaterally and diminished in the lower lobes Abdomen: soft, non-tender; bowel sounds normal; no masses,  no organomegaly Extremities: extremities normal, atraumatic, no cyanosis or edema Wound: clean and dry  Lab Results: CBC: Recent Labs    04/18/19 0355 04/18/19 0640 04/19/19 0403 04/19/19 0409  WBC 15.2*  --   --  15.7*  HGB 8.4*   < > 10.2* 9.2*  HCT 26.9*   < > 30.0* 29.8*  PLT 125*  --   --  161   < > = values in this interval not displayed.   BMET:  Recent Labs  04/18/19 1408 04/18/19 1408 04/19/19 0403 04/19/19 0409  NA 131*   < > 131* 135  K 4.4   < > 3.9 3.9  CL 86*  --   --  87*  CO2 32  --   --  32  GLUCOSE 139*  --   --  170*  BUN 37*  --   --  38*  CREATININE 1.09*  --   --  1.07*  CALCIUM 8.7*  --   --  8.5*   < > = values in this interval not displayed.    CMET: Lab Results  Component Value Date   WBC 15.7 (H) 04/19/2019   HGB 9.2 (L) 04/19/2019   HCT 29.8 (L) 04/19/2019   PLT 161 04/19/2019   GLUCOSE 170 (H) 04/19/2019   ALT 24 04/17/2019   AST 27 04/17/2019   NA 135 04/19/2019   K 3.9 04/19/2019   CL 87 (L) 04/19/2019   CREATININE 1.07 (H) 04/19/2019   BUN 38 (H) 04/19/2019   CO2 32 04/19/2019   TSH 5.506 (H) 04/16/2019   INR 1.5 (H)  04/13/2019   HGBA1C 6.3 (H) 04/12/2019      PT/INR: No results for input(s): LABPROT, INR in the last 72 hours. Radiology: No results found.   Assessment/Plan: S/P Procedure(s) (LRB): MITRAL VALVE REPAIR (MVR) using Memo 4D 34 MM Mitral Valve. (N/A) TRANSESOPHAGEAL ECHOCARDIOGRAM (TEE) (N/A) Clipping Of Atrial Appendage using AtriCure 45 MM AtriClip. (N/A)   1. Afib in the low 100s, BP well controlled. Remains on Amio and Lasix Drip at 15 2. Pulm-Extubated. HFNC this morning with BiPAP last night. CXR reviewed and stable this morning. Chest tubes put out 140cc/24 hours 3. Renal-creatinine 1.07, electrolytes okay. On lasix gtt, weight is now below baseline.  4. H and H 9.2/29.8, expected acute blood loss anemia 4. Endo- blood glucose well controlled 5. Continue Maxipime-started on 3/8.   Plan: Remove all chest tubes. Encouraged incentive spirometer hourly. Plans to walk in the halls later today. Tylenol for back pain.   [name] 04/19/2019 8:13 AM   I have seen and examined the patient and agree with the assessment and plan as outlined.  Doing much better.  Still in Afib but HR/BP stable.  Breathing improved.  Diuresing well and weight trending down.  Rexene Alberts, MD 04/19/2019

## 2019-04-19 NOTE — Progress Notes (Addendum)
Advanced Heart Failure Rounding Note  PCP-Cardiologist: No primary care provider on file.   Subjective:    S/p complex MV repair on 3/5   Sitting up in bed, nauseated. States she ate her breakfast too fast and constipated. No BM.   In afib 110s-120s. No chest pain or dyspnea.    Objective:   Weight Range: 98.8 kg Body mass index is 33.12 kg/m.   Vital Signs:   Temp:  [97.7 F (36.5 C)-98.1 F (36.7 C)] 97.8 F (36.6 C) (03/11 0800) Pulse Rate:  [110-120] 120 (03/11 0741) Resp:  [9-27] 15 (03/11 0800) BP: (93-145)/(29-133) 119/43 (03/11 0800) SpO2:  [93 %-100 %] 95 % (03/11 0800) Arterial Line BP: (86-120)/(45-59) 109/59 (03/10 1400) FiO2 (%):  [28 %-40 %] 28 % (03/11 0741) Weight:  [98.8 kg] 98.8 kg (03/11 0400) Last BM Date: 04/12/19  Weight change: Filed Weights   04/17/19 0500 04/18/19 0403 04/19/19 0400  Weight: 106.9 kg 102.5 kg 98.8 kg    Intake/Output:   Intake/Output Summary (Last 24 hours) at 04/19/2019 0925 Last data filed at 04/19/2019 0700 Gross per 24 hour  Intake 2187.46 ml  Output 3305 ml  Net -1117.54 ml      Physical Exam    PHYSICAL EXAM: CVP 14  General: sitting up in bed, looks uncomfortable (complains of nausea) HEENT: normal  Neck: supple. carotids 2+ bilat; no bruits. No lymphadenopathy or thryomegaly appreciated. Cor: PMI nondisplaced. irregularly irregular rhythm, tachy rate  No rubs, gallops or murmurs.  Sternal dressing okay. Lungs: clear  Abdomen: distended but nontender. No hepatosplenomegaly. No bruits or masses. Good bowel sounds. Extremities: no cyanosis, clubbing, rash, 2+ edema Neuro: alert & oriented x 3, cranial nerves grossly intact. moves all 4 extremities w/o difficulty. Affect pleasant.   Telemetry   afib 110s-120s Personally reviewed   Labs    CBC Recent Labs    04/18/19 0355 04/18/19 0640 04/19/19 0403 04/19/19 0409  WBC 15.2*  --   --  15.7*  HGB 8.4*   < > 10.2* 9.2*  HCT 26.9*   < > 30.0*  29.8*  MCV 93.1  --   --  92.8  PLT 125*  --   --  161   < > = values in this interval not displayed.   Basic Metabolic Panel Recent Labs    04/16/19 1646 04/17/19 0433 04/18/19 1408 04/18/19 1408 04/19/19 0403 04/19/19 0409  NA 133*   < > 131*   < > 131* 135  K 5.2*   < > 4.4   < > 3.9 3.9  CL 95*   < > 86*  --   --  87*  CO2 28   < > 32  --   --  32  GLUCOSE 164*   < > 139*  --   --  170*  BUN 22   < > 37*  --   --  38*  CREATININE 0.99   < > 1.09*  --   --  1.07*  CALCIUM 8.4*   < > 8.7*  --   --  8.5*  MG 2.0  --   --   --   --   --   PHOS 3.9  --   --   --   --   --    < > = values in this interval not displayed.   Liver Function Tests Recent Labs    04/17/19 0445  AST 27  ALT 24  ALKPHOS  81  BILITOT 1.4*  PROT 5.5*  ALBUMIN 2.9*   No results for input(s): LIPASE, AMYLASE in the last 72 hours. Cardiac Enzymes No results for input(s): CKTOTAL, CKMB, CKMBINDEX, TROPONINI in the last 72 hours.  BNP: BNP (last 3 results) No results for input(s): BNP in the last 8760 hours.  ProBNP (last 3 results) No results for input(s): PROBNP in the last 8760 hours.   D-Dimer No results for input(s): DDIMER in the last 72 hours. Hemoglobin A1C No results for input(s): HGBA1C in the last 72 hours. Fasting Lipid Panel No results for input(s): CHOL, HDL, LDLCALC, TRIG, CHOLHDL, LDLDIRECT in the last 72 hours. Thyroid Function Tests Recent Labs    04/16/19 1646  TSH 5.506*    Other results:   Imaging    DG CHEST PORT 1 VIEW  Result Date: 04/19/2019 CLINICAL DATA:  Status post MVR. EXAM: PORTABLE CHEST 1 VIEW COMPARISON:  04/18/2019 FINDINGS: The endotracheal tube and NG tubes have been removed. Bilateral chest tubes and mediastinal drain tubes are stable. The right IJ central venous Cordis has been removed. There is a new right-sided PICC line in place. Definite worsening aeration post extubation with low lung volumes, perihilar and bibasilar atelectasis. No  pneumothorax or pleural effusion. IMPRESSION: 1. Removal of endotracheal tube, NG tube and right IJ central venous Cordis. 2. New right-sided PICC line in good position. 3. Slight worsening aeration post extubation. 4. No pneumothorax or pleural effusions. Electronically Signed   By: Marijo Sanes M.D.   On: 04/19/2019 08:55     Medications:     Scheduled Medications: . bisacodyl  10 mg Rectal Daily  . budesonide (PULMICORT) nebulizer solution  0.5 mg Nebulization BID  . chlorhexidine  15 mL Mouth Rinse BID  . Chlorhexidine Gluconate Cloth  6 each Topical Daily  . [facility], Patient & Family Education   Does not apply Once  . docusate sodium  200 mg Oral Daily  . insulin aspart  0-24 Units Subcutaneous TID WC  . levalbuterol  1.25 mg Nebulization Q6H  . [START ON 04/20/2019] levothyroxine  125 mcg Oral QAC breakfast  . liothyronine  5 mcg Oral Daily  . mouth rinse  15 mL Mouth Rinse q12n4p  . metoCLOPramide (REGLAN) injection  5 mg Intravenous Q8H  . pantoprazole  40 mg Oral Daily  . senna-docusate  1 tablet Oral BID  . sodium chloride flush  3 mL Intravenous Q12H  . sorbitol  30 mL Oral Once  . spironolactone  25 mg Oral Daily  . warfarin  2.5 mg Oral q1800  . Warfarin - Physician Dosing Inpatient   Does not apply q1800    Infusions: . sodium chloride    . sodium chloride    . amiodarone 30 mg/hr (04/19/19 0700)  . ceFEPime (MAXIPIME) IV 2 g (04/19/19 0630)  . furosemide (LASIX) infusion 10 mg/hr (04/19/19 0816)    PRN Medications: sodium chloride, acetaminophen, bisacodyl, metoprolol tartrate, morphine injection, ondansetron (ZOFRAN) IV, sodium chloride flush, sodium chloride flush   Assessment/Plan    1. Mitral Regurgitation, severe - ECHO/TEE with severe MR P2 prolapse - s/p complex MV repair 3/5 - Doing well hemodynamically off inotropes and pressors.  - Remains volume overloaded. CVP 14 - Continue Lasix gtt 10 mg/hr  2. A/C Diastolic Heart  Failure/pulmonary HTN - ECHO 03/2019 EF 60-65% grade II DD  - s/p MVR.  - Remains volume overloaded, CVP 14. Continue aggressive diuresis w/ lasix gtt.   3. Acute  postop hypoxic respiratory failure - Improved and Extubated.   4. Acute blood loss anemia - 9.2 today. transfuse as needed.   5. H/o lupus and Sjogren's disease - followed at [facility] - her neck/ jaw pain in the area of the Stensen's duct may possibly be related to Sjogren's syndrome and salivary gland involvement.  - CT unremarkable.  - Treating w/ 3 day course of cefepime. Day 3/3  6.  Hypokalemia/hypomagnesemia. - K 3.9 today  - Continue supp KCl 40 mEq x 1 w/ Lasix gtt - Continue spironolactone 25 qd  7. GI (Nausea/Constipation) - abdomen distended.  - start Reglan 5 q8h - give sorbitol 30 mL   8. PAF - in AF today. Rate mildly elevated. Continue IV amio.     Length of Stay: [address], PA-C  04/19/2019, 9:25 AM  Advanced Heart Failure Team Pager [phone] (M-F; 7a - 4p)  Please contact [facility] for night-coverage after hours (4p -7a ) and weekends on [email or web address]  Patient seen and examined with the above-signed Advanced Practice Provider and/or Housestaff. I personally reviewed laboratory data, imaging studies and relevant notes. I independently examined the patient and formulated the important aspects of the plan. I have edited the note to reflect any of my changes or salient points. I have personally discussed the plan with the patient and/or family.  Respiratory status improved. Off BIPAP currently. More cantankerous. Remains volume overloaded. CVP 14. On lasix gtt. In AF with mild RVR  On exam JVP up Cor Irr tachy Lungs dull at bases Ab distended Ext 2+ edema   Continue IV diuresis. Continue IV amio (may need eventual DC-CV). Start Reglan. Mobilize.   Glori Bickers, MD  5:31 PM

## 2019-04-19 NOTE — Progress Notes (Signed)
[facility] BRIEF SICU PROGRESS NOTE  6 Days Post-Op  S/P Procedure(s) (LRB): MITRAL VALVE REPAIR (MVR) using Memo 4D 34 MM Mitral Valve. (N/A) TRANSESOPHAGEAL ECHOCARDIOGRAM (TEE) (N/A) Clipping Of Atrial Appendage using AtriCure 45 MM AtriClip. (N/A)   Stable day.  Ambulated short distance in room Still c/o nausea and no reported BM to date Remains in Afib on IV amiodarone w/ controlled HR and stable BP Breathing comfortably on nasal cannula  Plan: Continue current plan  Rexene Alberts, MD 04/19/2019 5:36 PM

## 2019-04-20 ENCOUNTER — Encounter (HOSPITAL_COMMUNITY): Payer: Self-pay | Admitting: Thoracic Surgery (Cardiothoracic Vascular Surgery)

## 2019-04-20 ENCOUNTER — Inpatient Hospital Stay (HOSPITAL_COMMUNITY): Payer: Medicare Other

## 2019-04-20 LAB — GLUCOSE, CAPILLARY
Glucose-Capillary: 103 mg/dL — ABNORMAL HIGH (ref 70–99)
Glucose-Capillary: 103 mg/dL — ABNORMAL HIGH (ref 70–99)
Glucose-Capillary: 124 mg/dL — ABNORMAL HIGH (ref 70–99)
Glucose-Capillary: 165 mg/dL — ABNORMAL HIGH (ref 70–99)
Glucose-Capillary: 248 mg/dL — ABNORMAL HIGH (ref 70–99)

## 2019-04-20 LAB — BASIC METABOLIC PANEL
Anion gap: 15 (ref 5–15)
Anion gap: 14 (ref 5–15)
BUN: 44 mg/dL — ABNORMAL HIGH (ref 8–23)
BUN: 44 mg/dL — ABNORMAL HIGH (ref 8–23)
CO2: 31 mmol/L (ref 22–32)
CO2: 31 mmol/L (ref 22–32)
Calcium: 8.2 mg/dL — ABNORMAL LOW (ref 8.9–10.3)
Calcium: 8 mg/dL — ABNORMAL LOW (ref 8.9–10.3)
Chloride: 86 mmol/L — ABNORMAL LOW (ref 98–111)
Chloride: 85 mmol/L — ABNORMAL LOW (ref 98–111)
Creatinine, Ser: 1.27 mg/dL — ABNORMAL HIGH (ref 0.44–1.00)
Creatinine, Ser: 1.29 mg/dL — ABNORMAL HIGH (ref 0.44–1.00)
GFR calc Af Amer: 50 mL/min — ABNORMAL LOW (ref >60)
GFR calc Af Amer: 49 mL/min — ABNORMAL LOW (ref >60)
GFR calc non Af Amer: 43 mL/min — ABNORMAL LOW (ref >60)
GFR calc non Af Amer: 43 mL/min — ABNORMAL LOW (ref >60)
Glucose, Bld: 280 mg/dL — ABNORMAL HIGH (ref 70–99)
Glucose, Bld: 276 mg/dL — ABNORMAL HIGH (ref 70–99)
Potassium: 3.3 mmol/L — ABNORMAL LOW (ref 3.5–5.1)
Potassium: 3.3 mmol/L — ABNORMAL LOW (ref 3.5–5.1)
Sodium: 132 mmol/L — ABNORMAL LOW (ref 135–145)
Sodium: 130 mmol/L — ABNORMAL LOW (ref 135–145)

## 2019-04-20 LAB — POCT I-STAT 7, (LYTES, BLD GAS, ICA,H+H)
Acid-Base Excess: 16 mmol/L — ABNORMAL HIGH (ref 0.0–2.0)
Bicarbonate: 41.3 mmol/L — ABNORMAL HIGH (ref 20.0–28.0)
Calcium, Ion: 1.19 mmol/L (ref 1.15–1.40)
HCT: 28 % — ABNORMAL LOW (ref 36.0–46.0)
Hemoglobin: 9.5 g/dL — ABNORMAL LOW (ref 12.0–15.0)
O2 Saturation: 93 %
Patient temperature: 98.2
Potassium: 3.5 mmol/L (ref 3.5–5.1)
Sodium: 129 mmol/L — ABNORMAL LOW (ref 135–145)
TCO2: 43 mmol/L — ABNORMAL HIGH (ref 22–32)
pCO2 arterial: 55 mmHg — ABNORMAL HIGH (ref 32.0–48.0)
pH, Arterial: 7.482 — ABNORMAL HIGH (ref 7.350–7.450)
pO2, Arterial: 62 mmHg — ABNORMAL LOW (ref 83.0–108.0)

## 2019-04-20 LAB — CBC
HCT: 26.9 % — ABNORMAL LOW (ref 36.0–46.0)
Hemoglobin: 8.5 g/dL — ABNORMAL LOW (ref 12.0–15.0)
MCH: 29 pg (ref 26.0–34.0)
MCHC: 31.6 g/dL (ref 30.0–36.0)
MCV: 91.8 fL (ref 80.0–100.0)
Platelets: 187 10*3/uL (ref 150–400)
RBC: 2.93 MIL/uL — ABNORMAL LOW (ref 3.87–5.11)
RDW: 13.5 % (ref 11.5–15.5)
WBC: 15.6 10*3/uL — ABNORMAL HIGH (ref 4.0–10.5)
nRBC: 0.2 % (ref 0.0–0.2)

## 2019-04-20 LAB — PROTIME-INR
INR: 1.2 (ref 0.8–1.2)
Prothrombin Time: 15.5 seconds — ABNORMAL HIGH (ref 11.4–15.2)

## 2019-04-20 LAB — MAGNESIUM: Magnesium: 1.9 mg/dL (ref 1.7–2.4)

## 2019-04-20 MED ORDER — POLYETHYLENE GLYCOL 3350 17 G PO PACK
17.0000 g | PACK | Freq: Two times a day (BID) | ORAL | Status: DC
  Administered 2019-04-20 – 2019-04-25 (×10): 17 g via ORAL
  Filled 2019-04-20 (×9): qty 1

## 2019-04-20 MED ORDER — FUROSEMIDE 10 MG/ML IJ SOLN
40.0000 mg | Freq: Every day | INTRAMUSCULAR | Status: DC
  Administered 2019-04-21: 40 mg via INTRAVENOUS
  Filled 2019-04-20: qty 4

## 2019-04-20 MED ORDER — SODIUM CHLORIDE 0.9 % IV SOLN
2.0000 g | Freq: Two times a day (BID) | INTRAVENOUS | Status: AC
  Administered 2019-04-20: 2 g via INTRAVENOUS
  Filled 2019-04-20: qty 2

## 2019-04-20 MED ORDER — POTASSIUM CHLORIDE 10 MEQ/50ML IV SOLN
10.0000 meq | INTRAVENOUS | Status: DC
  Administered 2019-04-20 (×2): 10 meq via INTRAVENOUS
  Filled 2019-04-20 (×2): qty 50

## 2019-04-20 MED ORDER — ATORVASTATIN CALCIUM 40 MG PO TABS
40.0000 mg | ORAL_TABLET | Freq: Every day | ORAL | Status: DC
  Administered 2019-04-22 – 2019-04-25 (×4): 40 mg via ORAL
  Filled 2019-04-20 (×4): qty 1

## 2019-04-20 MED ORDER — FLUOXETINE HCL 20 MG PO CAPS
20.0000 mg | ORAL_CAPSULE | Freq: Every day | ORAL | Status: DC
  Administered 2019-04-20 – 2019-04-26 (×6): 20 mg via ORAL
  Filled 2019-04-20 (×6): qty 1

## 2019-04-20 MED ORDER — INSULIN ASPART 100 UNIT/ML ~~LOC~~ SOLN
0.0000 [IU] | Freq: Three times a day (TID) | SUBCUTANEOUS | Status: DC
  Administered 2019-04-20: 4 [IU] via SUBCUTANEOUS
  Administered 2019-04-22 – 2019-04-23 (×2): 3 [IU] via SUBCUTANEOUS
  Administered 2019-04-24: 4 [IU] via SUBCUTANEOUS
  Administered 2019-04-24 – 2019-04-25 (×2): 3 [IU] via SUBCUTANEOUS

## 2019-04-20 MED ORDER — INSULIN ASPART 100 UNIT/ML ~~LOC~~ SOLN: 0.0000 [IU] | Freq: Every day | SUBCUTANEOUS | Status: DC

## 2019-04-20 MED ORDER — ASPIRIN EC 81 MG PO TBEC
81.0000 mg | DELAYED_RELEASE_TABLET | Freq: Every day | ORAL | Status: DC
  Administered 2019-04-20 – 2019-04-25 (×6): 81 mg via ORAL
  Filled 2019-04-20 (×6): qty 1

## 2019-04-20 MED ORDER — LEVALBUTEROL HCL 1.25 MG/0.5ML IN NEBU
1.2500 mg | INHALATION_SOLUTION | Freq: Three times a day (TID) | RESPIRATORY_TRACT | Status: DC
  Administered 2019-04-20 (×3): 1.25 mg via RESPIRATORY_TRACT
  Filled 2019-04-20 (×3): qty 0.5

## 2019-04-20 MED ORDER — METOPROLOL TARTRATE 12.5 MG HALF TABLET
12.5000 mg | ORAL_TABLET | Freq: Two times a day (BID) | ORAL | Status: DC
  Administered 2019-04-20 – 2019-04-26 (×13): 12.5 mg via ORAL
  Filled 2019-04-20 (×13): qty 1

## 2019-04-20 MED ORDER — POTASSIUM CHLORIDE CRYS ER 20 MEQ PO TBCR
30.0000 meq | EXTENDED_RELEASE_TABLET | Freq: Two times a day (BID) | ORAL | Status: AC
  Administered 2019-04-20 (×2): 30 meq via ORAL
  Filled 2019-04-20 (×2): qty 1

## 2019-04-20 MED ORDER — POTASSIUM CHLORIDE CRYS ER 20 MEQ PO TBCR
30.0000 meq | EXTENDED_RELEASE_TABLET | Freq: Once | ORAL | Status: AC
  Administered 2019-04-20: 30 meq via ORAL
  Filled 2019-04-20: qty 1

## 2019-04-20 MED ORDER — AMIODARONE HCL 200 MG PO TABS
400.0000 mg | ORAL_TABLET | Freq: Two times a day (BID) | ORAL | Status: DC
  Administered 2019-04-20 – 2019-04-22 (×5): 400 mg via ORAL
  Filled 2019-04-20 (×5): qty 2

## 2019-04-20 MED ORDER — FUROSEMIDE 40 MG PO TABS
40.0000 mg | ORAL_TABLET | Freq: Once | ORAL | Status: AC
  Administered 2019-04-20: 40 mg via ORAL
  Filled 2019-04-20: qty 1

## 2019-04-20 MED ORDER — FUROSEMIDE 40 MG PO TABS
40.0000 mg | ORAL_TABLET | Freq: Two times a day (BID) | ORAL | Status: DC
  Administered 2019-04-20: 40 mg via ORAL
  Filled 2019-04-20: qty 1

## 2019-04-20 NOTE — Discharge Instructions (Signed)
Information on my medicine - Coumadin   (Warfarin)  Why was Coumadin prescribed for you? Coumadin was prescribed for you because you have a blood clot or a medical condition that can cause an increased risk of forming blood clots. Blood clots can cause serious health problems by blocking the flow of blood to the heart, lung, or brain. Coumadin can prevent harmful blood clots from forming. As a reminder your indication for Coumadin is:   Blood Clot Prevention After Heart Valve Surgery  What test will check on my response to Coumadin? While on Coumadin (warfarin) you will need to have an INR test regularly to ensure that your dose is keeping you in the desired range. The INR (international normalized ratio) number is calculated from the result of the laboratory test called prothrombin time (PT).  If an INR APPOINTMENT HAS NOT ALREADY BEEN MADE FOR YOU please schedule an appointment to have this lab work done by your health care provider within 7 days. Your INR goal is usually a number between:  2 to 3 or your provider may give you a more narrow range like 2-2.5.  Ask your health care provider during an office visit what your goal INR is.  What  do you need to  know  About  COUMADIN? Take Coumadin (warfarin) exactly as prescribed by your healthcare provider about the same time each day.  DO NOT stop taking without talking to the doctor who prescribed the medication.  Stopping without other blood clot prevention medication to take the place of Coumadin may increase your risk of developing a new clot or stroke.  Get refills before you run out.  What do you do if you miss a dose? If you miss a dose, take it as soon as you remember on the same day then continue your regularly scheduled regimen the next day.  Do not take two doses of Coumadin at the same time.  Important Safety Information A possible side effect of Coumadin (Warfarin) is an increased risk of bleeding. You should call your healthcare  provider right away if you experience any of the following: ? Bleeding from an injury or your nose that does not stop. ? Unusual colored urine (red or dark brown) or unusual colored stools (red or black). ? Unusual bruising for unknown reasons. ? A serious fall or if you hit your head (even if there is no bleeding).  Some foods or medicines interact with Coumadin (warfarin) and might alter your response to warfarin. To help avoid this: ? Eat a balanced diet, maintaining a consistent amount of Vitamin K. ? Notify your provider about major diet changes you plan to make. ? Avoid alcohol or limit your intake to 1 drink for women and 2 drinks for men per day. (1 drink is 5 oz. wine, 12 oz. beer, or 1.5 oz. liquor.)  Make sure that ANY health care provider who prescribes medication for you knows that you are taking Coumadin (warfarin).  Also make sure the healthcare provider who is monitoring your Coumadin knows when you have started a new medication including herbals and non-prescription products.  Coumadin (Warfarin)  Major Drug Interactions  Increased Warfarin Effect Decreased Warfarin Effect  Alcohol (large quantities) Antibiotics (esp. Septra/Bactrim, Flagyl, Cipro) Amiodarone (Cordarone) Aspirin (ASA) Cimetidine (Tagamet) Megestrol (Megace) NSAIDs (ibuprofen, naproxen, etc.) Piroxicam (Feldene) Propafenone (Rythmol SR) Propranolol (Inderal) Isoniazid (INH) Posaconazole (Noxafil) Barbiturates (Phenobarbital) Carbamazepine (Tegretol) Chlordiazepoxide (Librium) Cholestyramine (Questran) Griseofulvin Oral Contraceptives Rifampin Sucralfate (Carafate) Vitamin K   Coumadin (Warfarin) Major  Herbal Interactions  Increased Warfarin Effect Decreased Warfarin Effect  Garlic Ginseng Ginkgo biloba Coenzyme Q10 Green tea St. John's wort    Coumadin (Warfarin) FOOD Interactions  Eat a consistent number of servings per week of foods HIGH in Vitamin K (1 serving =  cup)  Collards  (cooked, or boiled & drained) Kale (cooked, or boiled & drained) Mustard greens (cooked, or boiled & drained) Parsley *serving size only =  cup Spinach (cooked, or boiled & drained) Swiss chard (cooked, or boiled & drained) Turnip greens (cooked, or boiled & drained)  Eat a consistent number of servings per week of foods MEDIUM-HIGH in Vitamin K (1 serving = 1 cup)  Asparagus (cooked, or boiled & drained) Broccoli (cooked, boiled & drained, or raw & chopped) Brussel sprouts (cooked, or boiled & drained) *serving size only =  cup Lettuce, raw (green leaf, endive, romaine) Spinach, raw Turnip greens, raw & chopped   These websites have more information on Coumadin (warfarin):  FailFactory.se; VeganReport.com.au;  Discharge Instructions:  1. You may shower, please wash incisions daily with soap and water and keep dry.  If you wish to cover wounds with dressing you may do so but please keep clean and change daily.  No tub baths or swimming until incisions have completely healed.  If your incisions become red or develop any drainage please call our office at [phone]  2. No Driving until cleared by our office and you are no longer using narcotic pain medications  3. Monitor your weight daily.. Please use the same scale and weigh at same time... If you gain 3-5 lbs in 48 hours with associated lower extremity swelling, please contact our office at [phone]  4. Fever of 101.5 for at least 24 hours, please contact our office at [phone]  5. Activity- up as tolerated, please walk at least 3 times per day.  Avoid strenuous activity, no lifting, pushing, or pulling with your arms over 8-10 lbs for a minimum of 6 weeks  6. If any questions or concerns arise, please do not hesitate to contact our office at [phone]

## 2019-04-20 NOTE — Plan of Care (Signed)
  Problem: Clinical Measurements: Goal: Ability to maintain clinical measurements within normal limits will improve Outcome: Progressing Goal: Diagnostic test results will improve Outcome: Progressing Goal: Cardiovascular complication will be avoided Outcome: Progressing   Problem: Activity: Goal: Risk for activity intolerance will decrease Outcome: Progressing   Problem: Elimination: Goal: Will not experience complications related to bowel motility Outcome: Not Progressing Note: Patient still not able to have a bowel movement despite medications and attempting to mobilize

## 2019-04-20 NOTE — Progress Notes (Addendum)
T[facility] DAILY ICU PROGRESS NOTE                   [email or web address]            [place] [address]          [phone]   7 Days Post-Op Procedure(s) (LRB): MITRAL VALVE REPAIR (MVR) using Memo 4D 34 MM Mitral Valve. (N/A) TRANSESOPHAGEAL ECHOCARDIOGRAM (TEE) (N/A) Clipping Of Atrial Appendage using AtriCure 45 MM AtriClip. (N/A)  Total Length of Stay:  LOS: 10 days   Subjective: Feel okay this morning. Her bottom is sore and she would like to be readjusted in the chair. Nursing made aware.   Objective: Vital signs in last 24 hours: Temp:  [97.6 F (36.4 C)-98.4 F (36.9 C)] 98.4 F (36.9 C) (03/12 0700) Pulse Rate:  [81-104] 94 (03/12 0805) Cardiac Rhythm: Atrial flutter;Atrial fibrillation (03/12 0400) Resp:  [11-31] 22 (03/12 0805) BP: (95-129)/(32-68) 118/57 (03/12 0700) SpO2:  [85 %-98 %] 95 % (03/12 0700) Weight:  [97.1 kg] 97.1 kg (03/12 0700)  Filed Weights   04/18/19 0403 04/19/19 0400 04/20/19 0700  Weight: 102.5 kg 98.8 kg 97.1 kg    Weight change: -1.7 kg   Hemodynamic parameters for last 24 hours: CVP:  [11 mmHg-13 mmHg] 11 mmHg  Intake/Output from previous day: 03/11 0701 - 03/12 0700 In: 1230.8 [P.O.:240; I.V.:590.8; IV Piggyback:400] Out: 1225 [Urine:1225]  Intake/Output this shift: Total I/O In: 78.8 [IV Piggyback:78.8] Out: -   Current Meds: Scheduled Meds: . bisacodyl  10 mg Rectal Daily  . budesonide (PULMICORT) nebulizer solution  0.5 mg Nebulization BID  . chlorhexidine  15 mL Mouth Rinse BID  . Chlorhexidine Gluconate Cloth  6 each Topical Daily  . [facility], Patient & Family Education   Does not apply Once  . docusate sodium  200 mg Oral Daily  . feeding supplement  1 Container Oral TID BM  . insulin aspart  0-24 Units Subcutaneous TID WC  . levalbuterol  1.25 mg Nebulization TID  . levothyroxine  125 mcg Oral QAC breakfast  . liothyronine  5 mcg Oral Daily  . mouth rinse  15 mL Mouth Rinse q12n4p  .  pantoprazole  40 mg Oral Daily  . senna-docusate  1 tablet Oral BID  . sodium chloride flush  3 mL Intravenous Q12H  . spironolactone  25 mg Oral Daily  . warfarin  2.5 mg Oral q1800  . Warfarin - Physician Dosing Inpatient   Does not apply q1800   Continuous Infusions: . sodium chloride    . sodium chloride 500 mL (04/20/19 0616)  . amiodarone 30 mg/hr (04/20/19 0700)  . ceFEPime (MAXIPIME) IV    . furosemide (LASIX) infusion 10 mg/hr (04/20/19 0700)  . potassium chloride 10 mEq (04/20/19 0725)   PRN Meds:.sodium chloride, acetaminophen, bisacodyl, Melatonin, metoprolol tartrate, morphine injection, ondansetron (ZOFRAN) IV, sodium chloride flush, sodium chloride flush  General appearance: alert, cooperative and no distress Heart: regular rate and rhythm, S1, S2 normal, no murmur, click, rub or gallop Lungs: clear to auscultation bilaterally Abdomen: soft, non-tender; bowel sounds normal; no masses,  no organomegaly Extremities: extremities normal, atraumatic, no cyanosis or edema Wound: clean and dry  Lab Results: CBC: Recent Labs    04/19/19 0409 04/19/19 0409 04/20/19 0324 04/20/19 0352  WBC 15.7*  --  15.6*  --   HGB 9.2*   < > 8.5* 9.5*  HCT 29.8*   < > 26.9* 28.0*  PLT  161  --  187  --    < > = values in this interval not displayed.   BMET:  Recent Labs    04/19/19 0409 04/19/19 0409 04/20/19 0324 04/20/19 0352  NA 135   < > 132* 129*  K 3.9   < > 3.3* 3.5  CL 87*  --  86*  --   CO2 32  --  31  --   GLUCOSE 170*  --  280*  --   BUN 38*  --  44*  --   CREATININE 1.07*  --  1.27*  --   CALCIUM 8.5*  --  8.2*  --    < > = values in this interval not displayed.    CMET: Lab Results  Component Value Date   WBC 15.6 (H) 04/20/2019   HGB 9.5 (L) 04/20/2019   HCT 28.0 (L) 04/20/2019   PLT 187 04/20/2019   GLUCOSE 280 (H) 04/20/2019   ALT 24 04/17/2019   AST 27 04/17/2019   NA 129 (L) 04/20/2019   K 3.5 04/20/2019   CL 86 (L) 04/20/2019   CREATININE  1.27 (H) 04/20/2019   BUN 44 (H) 04/20/2019   CO2 31 04/20/2019   TSH 5.506 (H) 04/16/2019   INR 1.2 04/20/2019   HGBA1C 6.3 (H) 04/12/2019      PT/INR:  Recent Labs    04/20/19 0324  LABPROT 15.5*  INR 1.2   Radiology: No results found.   Assessment/Plan: S/P Procedure(s) (LRB): MITRAL VALVE REPAIR (MVR) using Memo 4D 34 MM Mitral Valve. (N/A) TRANSESOPHAGEAL ECHOCARDIOGRAM (TEE) (N/A) Clipping Of Atrial Appendage using AtriCure 45 MM AtriClip. (N/A)  1. NSR this morning. Switch Amio gtt to oral. MAPs have been 60s-70s. Off all other drips. 2. Pulm-chest tubes removed. Continue to wean oxygen. CXR improved this morning. Continue to use incentive spirometer.  3. Renal-creatinine 1.27, potassium 3.5, Replace. Continue lasix drip at 10 4. H and H-expected acute blood loss anemia. 9.5/28.0 5. Endo-blood glucose well controlled on current regimen 6. Continue coumadin at 2.5mg . INR 1.2  Plan: Continue to wean oxygen as tolerated. Walk in the halls today. Possible transfer later today. Change Amio to oral today.      [name] 04/20/2019 8:45 AM    I have seen and examined the patient and agree with the assessment and plan as outlined.  Doing very well.  Still in Afib w/ controlled rate and BP.  Breathing comfortably on 2 L/min via [place] w/ ABG c/w preop.  CXR looks good.  I/O's balanced yesterday but weight down further and actually lower than preop, although preop weight in setting of acute on chronic CHF.  No evidence for HCAP  Mobilize.  Convert amiodarone to oral.  Add low dose beta blocker.  Continue Coumadin.  Stop antibiotics.  PT consult.  Transfer 4E once mobility improved.  Rexene Alberts, MD 04/20/2019 9:10 AM

## 2019-04-20 NOTE — Discharge Summary (Signed)
Physician Discharge Summary        [address]       ,[place] [address]             [phone]      Patient ID: Rebecca Carr MRN: [id number] DOB/AGE: 1950-07-31 69 y.o.  Admit date: 04/10/2019 Discharge date: 04/26/2019  Admission Diagnoses:  Patient Active Problem List   Diagnosis Date Noted  . Acute respiratory failure with hypoxia ([facility] [facility])   . COPD (chronic obstructive pulmonary disease) ([facility])   . Encounter for intubation   . Obstructive sleep apnea 04/03/2019  . Mitral valve prolapse 03/28/2019  . Chest pain of uncertain etiology 0000000  . Coronary artery calcification of native artery 02/27/2019  . Acute on chronic diastolic heart failure ([place]) 01/03/2017  . NSVT (nonsustained ventricular tachycardia) ([place]) 10/01/2016  . Exertional dyspnea 10/01/2016  . Bilateral leg cramps 07/30/2016  . Severe mitral regurgitation 07/30/2016  . Rapid heart rate 07/30/2016  . Palpitations 07/30/2016  . PVC's (premature ventricular contractions) 07/30/2016  . Abnormal EKG 07/30/2016  . 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 Hx of colonic polyps 08/28/2013  . GERD 03/02/2010  . DERMATITIS, CHRONIC 03/02/2010  . FLATULENCE ERUCTATION AND GAS PAIN 03/02/2010  . DIARRHEA 03/02/2010    Discharge Diagnoses:  Principal Problem:   S/P mitral valve repair Active Problems:   Severe mitral regurgitation   NSVT (nonsustained ventricular tachycardia) ([facility])   Acute on chronic diastolic heart failure ([facility])   Mitral valve prolapse   Obstructive sleep apnea   Encounter for intubation   COPD (chronic obstructive pulmonary disease) ([facility])   S/P MVR (mitral valve replacement)   Acute respiratory failure with hypoxia ([facility])   Discharged Condition: good  HPI:   Patient is a 69 year old female with multiple medical problems including a known history of mitral valve prolapse who was admitted to the hospital with  acute exacerbation of chronic diastolic congestive heart failure and has been referred for surgical consultation to discuss treatment options for management of mitral valve prolapse with severe symptomatic primary mitral regurgitation.  Patient states that she had "scarlet fever" during childhood.  During early adulthood she was noted to have a murmur on physical exam and nearly 30 years ago she was evaluated at [facility] where she was told she had mitral valve prolapse.  She was also told that eventually she would likely need to have her valve repaired or replaced when she got older.  Patient states that ever since then she has had history of occasional palpitations with atypical chest pain and mild exertional shortness of breath.    Several years ago she began to experience more episodes of palpitations with brief episodes of the sensation that her heart is pounding and racing at a fast rate.  These episodes always stop within a few seconds but are associated with increased shortness of breath.  She was referred for cardiology consultation and has been evaluated previously by Dr. Ellyn Hack.  Echocardiogram performed in 2018 revealed normal left ventricular systolic function with what was felt to be mild mitral regurgitation.  Over the past 6 months the patient has developed increased frequency of palpitations with worsening exertional shortness of breath.  Follow-up transthoracic echocardiogram performed March 12, 2019 revealed normal left ventricular systolic function with moderately severe and possibly severe mitral regurgitation.  The patient was seen in follow-up by Dr.Harding which time she was experiencing shortness of breath  with very mild exertion and occasionally at rest.  She was brought in for elective transesophageal echocardiogram and diagnostic cardiac catheterization on April 10, 2019.  TEE reveals normal left ventricular systolic function, mitral valve prolapse, and  mitral regurgitation.  Official report for the TEE remains pending at this time.  Catheterization was notable for the absence of significant coronary artery disease but revealed moderate to severe pulmonary hypertension and large V waves on wedge tracing consistent with severe mitral regurgitation.  The patient was admitted to the hospital and seen in consultation by Dr. Haroldine Laws from the advanced heart failure team.  Intravenous diuretic therapy has been initiated and the patient has responded remarkably well.  Cardiothoracic surgical consultation was requested.  Patient is married and lives locally in [place].  She has been retired for little over 3 years.  She lives a sedentary lifestyle.  She states that she has "fallen apart" since she retired 3 years ago with a rapidly increasing list of multiple medical problems.  Her primary limitation is that of exertional shortness of breath and fatigue.  She now gets short of breath with low-level activity and occasionally at rest.  She cannot lay flat in bed.  She has had some lower extremity edema.  She reports frequent tachypalpitations although none are sustained.  She has not had exertional chest pain or chest tightness but she does get atypical sharp pains across the left chest off and on.  She has not had dizzy spells or syncope.    Hospital Course:   Rebecca Carr underwent a mitral valve repair with Dr. Roxy Manns on 04/13/2019. She tolerated the procedure well and was transferred to the surgical ICU for continued care. She was extubated POD 1 but later that night became hypercarbic not corrected with BiPAP therefore she was re-intubated that evening. Inhaled nitric oxide started. She continued on the ventilator and weaning pressors. We stopped nitric oxide on 3/9 and continued a lasix drip for fluid overload. POD 5 remained on the vent. She was in Afib on an Amio drip. She continued on the lasix drip with excellent response. She was extubated later POD  5. She remained in Afib in the low 100s. She was rate-controlled and her BP was stable. We continued empiric antibiotics for suspected pneumonia. All her chest tubes were removed POD 6. POD 7 she continued to progress. We turned off her Amio drip and switched her to Amio PO. She was in NSR in the 70s but continued to go in and out of Afib. We added a low-dose BB and worked on weaning her oxygen. We planned to transfer to 4E once her mobility improved. On 3/14 she was transferred to 4E for continued care. She continued to struggle with post-op delirium with vivid halluciations. She was started on zyprexa by hear failure. We stopped her diuretics since she was well below her baseline weight. We continued to wean oxygen as tolerated. PT recommended home health PT with 24 hour supervision. Her epicardial pacing wires were removed on 3/17. We continued to work on ambulation in the halls which continued to be a struggle. She was cleared for discharge on 3/18 to home with home health resources.   Consults: critical care, heart failure, dental medicine, otolaryngology  Significant Diagnostic Studies:  CLINICAL DATA:  Recent MVR  EXAM: PORTABLE CHEST 1 VIEW  COMPARISON:  Multiple recent chest films.  FINDINGS: The right PICC line is stable.  Both chest tubes have been removed. The mediastinal drain tube has  also been removed. No pneumothorax. Persistent mild vascular congestion but no pulmonary edema or pleural effusions. Streaky bibasilar atelectasis, improved.  IMPRESSION: 1. Removal of chest tubes and mediastinal drain tube. No pneumothorax. 2. Improved bibasilar atelectasis.   Electronically Signed   By: Marijo Sanes M.D.   On: 04/20/2019 08:48   Treatments:  CARDIOTHORACIC SURGERY OPERATIVE NOTE  Date of Procedure:                04/13/2019  Preoperative Diagnosis:      Severe Mitral Regurgitation  Postoperative Diagnosis:    Same  Procedure:        Mitral Valve  Repair             Complex valvuloplasty including triangular resection of posterior leaflet             Artificial Gore-tex neochord placement x6             Suture plications of posterior leaflets clefts x2             Sorin Memo 4D ring annuloplasty (size 38mm, catalog #[id number], serial [id number])             Clipping of left atrial appendage (Atricure Pro245 left atrial clip, size 45 mm)  Surgeon:        Valentina Gu. 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 rate (!) 29, height 5\' 8"  (1.727 m), weight 97.9 kg, SpO2 96 %.\  General appearance: alert, cooperative and no distress Heart: regular rate and rhythm, S1, S2 normal, no murmur, click, rub or gallop Lungs: clear to auscultation bilaterally Abdomen: soft, non-tender; bowel sounds normal; no masses,  no organomegaly Extremities: extremities normal, atraumatic, no cyanosis or edema Wound: clean and dry  Disposition: Discharge disposition: 01-Home or Self Care       Discharge Instructions    Amb Referral to Cardiac Rehabilitation   Complete by: As directed    Diagnosis: Valve Repair   Valve: Mitral   After initial evaluation and assessments completed: Virtual Based Care may be provided alone or in conjunction with Phase 2 Cardiac Rehab based on patient barriers.: Yes     Allergies as of 04/26/2019      Reactions   Iodine Anaphylaxis   Iohexol Anaphylaxis    Desc: Patient states she is allergic to iodine and had a "code blue" incident after an  injection for some type of imaging study.   Penicillins Rash   PCN IN LARGE DOSES. Did it involve swelling of the face/tongue/throat, SOB, or low BP? No Did it involve sudden or severe rash/hives, skin peeling, or any reaction on the inside of your mouth or nose? No Did you need to seek medical attention at a hospital or doctor's office? No When did it last happen? More than 10 years ago If all above answers are "NO", may proceed with cephalosporin use.      Medication List    STOP taking these medications   azelastine 0.1 % nasal spray Commonly known as: ASTELIN  hydroxychloroquine 200 MG tablet Commonly known as: PLAQUENIL   Krill Oil 500 MG Caps   neomycin-bacitracin-polymyxin ointment Commonly known as: NEOSPORIN   Nicorette 2 MG gum Generic drug: nicotine polacrilex   predniSONE 50 MG tablet Commonly known as: DELTASONE     TAKE these medications   acetaminophen 325 MG tablet Commonly known as: TYLENOL Take 2 tablets (650 mg total) by mouth every 6 (six) hours as needed (back pain).   ALBUTEROL IN Inhale 1-2 puffs into the lungs every 6 (six) hours as needed.   amiodarone 200 MG tablet Commonly known as: PACERONE Take 1 tablet (200 mg total) by mouth 2 (two) times daily.   aspirin EC 81 MG tablet Take 81 mg by mouth at bedtime. Reported on 07/16/2015   atorvastatin 40 MG tablet Commonly known as: LIPITOR Take 40 mg by mouth at bedtime.   Cytomel 5 MCG tablet Generic drug: liothyronine Take 5 mcg by mouth daily.   FLUoxetine 20 MG capsule Commonly known as: PROZAC Take 20 mg by mouth daily at 6 PM. (1700)   fluticasone 50 MCG/ACT nasal spray Commonly known as: FLONASE Place 2 sprays into both nostrils at bedtime.   furosemide 20 MG tablet Commonly known as: LASIX Take 1 tablet (20 mg total) by mouth daily. What changed:   medication strength  how much to take  how to take this  when to take this  additional instructions     levothyroxine 125 MCG tablet Commonly known as: SYNTHROID Take 125 mcg by mouth daily before breakfast.   Melatonin 3 MG Tabs Take 1 tablet (3 mg total) by mouth at bedtime as needed (sleep).   metoprolol tartrate 25 MG tablet Commonly known as: LOPRESSOR Take 0.5 tablets (12.5 mg total) by mouth 2 (two) times daily. What changed:   how much to take  how to take this  when to take this  additional instructions   multivitamin with minerals Tabs tablet Take 1 tablet by mouth daily. Multivitamin Essentials   OLANZapine zydis 5 MG disintegrating tablet Commonly known as: ZYPREXA Take 1 tablet (5 mg total) by mouth daily.   omeprazole 40 MG capsule Commonly known as: PRILOSEC TAKE 1 CAPSULE(40 MG) BY MOUTH TWICE DAILY What changed: See the new instructions.   potassium chloride SA 20 MEQ tablet Commonly known as: KLOR-CON Take 1 tablet (20 mEq total) by mouth daily.   spironolactone 25 MG tablet Commonly known as: ALDACTONE Take 0.5 tablets (12.5 mg total) by mouth daily.   Vitamin B-12 5000 MCG Lozg Take 5,000 mcg by mouth daily.   warfarin 4 MG tablet Commonly known as: COUMADIN Take 1 tablet (4 mg total) by mouth daily at 6 PM.            Durable Medical Equipment  (From admission, onward)         Start     Ordered   04/26/19 0739  For home use only DME oxygen  Once    Question Answer Comment  Length of Need 6 Months   Mode or (Route) Nasal cannula   Liters per Minute 2   Frequency Continuous (stationary and portable oxygen unit needed)   Oxygen conserving device Yes   Oxygen delivery system Gas      04/26/19 0738         Follow-up Information    Aurea Graff, PA-C. Call in 1 day(s).   Specialty: Physician Assistant Contact information: [address] [place] [address] [phone]  Rexene Alberts, MD Follow up.   Specialty: Cardiothoracic Surgery Why: Your routine follow-up appointment is on 4/12 at 11:30am. Please  arrive at 11:00am for a chest xray located at [facility] which is on the first floor of our building.  Contact information: [address] [place] [address] [phone]        Leonie Man, MD Follow up.   Specialty: Cardiology Contact information: [address] [place] [address] [phone]        Almyra Deforest, [place] Follow up.   Specialties: Cardiology, Radiology Why: Almyra Deforest, PA-C 4/1 @10 :45 am ([facility])  Contact information: [address] [place] [address] [phone]        [facility] Follow [facility].   Why: Coumadin Clinic 3/22 @10 :30 am ([facility])  Contact information: [facility] [address] [place] [address]       [facility] Follow up.   Specialty: Home Health Services Why: For home health services. They will call you to set up your first home visit Contact information: [address]  [place] [address] [phone]        Bensimhon, Shaune Pascal, MD Follow up.   Specialty: Cardiology Why: 12:20 PM  [facility] [address] 500[address] Contact information: [address] [place] [address] [phone]          The patient has been discharged on:   1.Beta Blocker:  Yes [ yes  ]                              No   [   ]                              If No, reason:  2.Ace Inhibitor/ARB: Yes [   ]                                     No  [  no  ]                                     If No, reason: BP low normal  3.Statin:   Yes [ yes  ]                  No  [   ]                  If No, reason:  4.Ecasa:  Yes  [  yes ]                  No   [   ]                  If No, reason:    Signed: [name] 04/26/2019, 9:38 AM

## 2019-04-20 NOTE — Progress Notes (Signed)
      [address]       [place] [address]             [phone]       Checked in on Rebecca Carr this afternoon. She is having some vivid hallucinations. Afraid to walk due to the "floor moving". All pain medication discontinued other than Tylenol. Likely ICU delirium. Again encouraged to ambulate today. She made it to the door and back a few hours ago but she hasn't made it to the hall. On call PA made aware. Nursing plans to try to walk with her again in 1-2  Hours. The patient does not want to walk now since she is comfortable in bed.    Nicholes Rough, PA-C

## 2019-04-20 NOTE — Progress Notes (Addendum)
Advanced Heart Failure Rounding Note  PCP-Cardiologist: No primary care provider on file.   Subjective:    S/p complex MV repair on 3/5   On amio drip + lasix drip.   CVP 5.   Complaining of fatigue. Denies SOB   Objective:   Weight Range: 97.1 kg Body mass index is 32.55 kg/m.   Vital Signs:   Temp:  [97.6 F (36.4 C)-98.4 F (36.9 C)] 98.4 F (36.9 C) (03/12 0700) Pulse Rate:  [81-104] 94 (03/12 0805) Resp:  [11-31] 22 (03/12 0805) BP: (95-129)/(32-68) 118/57 (03/12 0700) SpO2:  [85 %-98 %] 95 % (03/12 0700) Weight:  [97.1 kg] 97.1 kg (03/12 0700) Last BM Date: (prior to admit)  Weight change: Filed Weights   04/18/19 0403 04/19/19 0400 04/20/19 0700  Weight: 102.5 kg 98.8 kg 97.1 kg    Intake/Output:   Intake/Output Summary (Last 24 hours) at 04/20/2019 0848 Last data filed at 04/20/2019 0800 Gross per 24 hour  Intake 1177.87 ml  Output 1225 ml  Net -47.13 ml      Physical Exam    PHYSICAL EXAM: CVP 5-6  General:  Sitting in the chair. No resp difficulty HEENT: normal anicteric Neck: supple. JVP 5-6 . Carotids 2+ bilat; no bruits. No lymphadenopathy or thryomegaly appreciated. Cor: Sternal wound ok. PMI nondisplaced. Irregular rate & rhythm. No rubs, gallops or murmurs. Lungs: clear on 2 liters  Beach  No wheeze. Dull at bases Abdomen: soft, nontender, nondistended. No hepatosplenomegaly. No bruits or masses. Good bowel sounds. Extremities: no cyanosis, clubbing, rash, 1+ edema. RUE PICC  Neuro: alert & oriented x 3, cranial nerves grossly intact. moves all 4 extremities w/o difficulty. Affect pleasant   Telemetry   A Fib 90s Personally reviewed    Labs    CBC Recent Labs    04/19/19 0409 04/19/19 0409 04/20/19 0324 04/20/19 0352  WBC 15.7*  --  15.6*  --   HGB 9.2*   < > 8.5* 9.5*  HCT 29.8*   < > 26.9* 28.0*  MCV 92.8  --  91.8  --   PLT 161  --  187  --    < > = values in this interval not displayed.   Basic Metabolic  Panel Recent Labs    04/19/19 0409 04/19/19 0409 04/20/19 0324 04/20/19 0352  NA 135   < > 132* 129*  K 3.9   < > 3.3* 3.5  CL 87*  --  86*  --   CO2 32  --  31  --   GLUCOSE 170*  --  280*  --   BUN 38*  --  44*  --   CREATININE 1.07*  --  1.27*  --   CALCIUM 8.5*  --  8.2*  --    < > = values in this interval not displayed.   Liver Function Tests No results for input(s): AST, ALT, ALKPHOS, BILITOT, PROT, ALBUMIN in the last 72 hours. No results for input(s): LIPASE, AMYLASE in the last 72 hours. Cardiac Enzymes No results for input(s): CKTOTAL, CKMB, CKMBINDEX, TROPONINI in the last 72 hours.  BNP: BNP (last 3 results) No results for input(s): BNP in the last 8760 hours.  ProBNP (last 3 results) No results for input(s): PROBNP in the last 8760 hours.   D-Dimer No results for input(s): DDIMER in the last 72 hours. Hemoglobin A1C No results for input(s): HGBA1C in the last 72 hours. Fasting Lipid Panel No results for input(s): CHOL, HDL, LDLCALC,  TRIG, CHOLHDL, LDLDIRECT in the last 72 hours. Thyroid Function Tests No results for input(s): TSH, T4TOTAL, T3FREE, THYROIDAB in the last 72 hours.  Invalid input(s): FREET3  Other results:   Imaging    No results found.   Medications:     Scheduled Medications: . amiodarone  400 mg Oral BID  . bisacodyl  10 mg Rectal Daily  . budesonide (PULMICORT) nebulizer solution  0.5 mg Nebulization BID  . chlorhexidine  15 mL Mouth Rinse BID  . Chlorhexidine Gluconate Cloth  6 each Topical Daily  . [facility], Patient [facility] Family Education   Does not apply Once  . docusate sodium  200 mg Oral Daily  . feeding supplement  1 Container Oral TID BM  . insulin aspart  0-24 Units Subcutaneous TID WC  . levalbuterol  1.25 mg Nebulization TID  . levothyroxine  125 mcg Oral QAC breakfast  . liothyronine  5 mcg Oral Daily  . mouth rinse  15 mL Mouth Rinse q12n4p  . pantoprazole  40 mg Oral Daily  .  senna-docusate  1 tablet Oral BID  . sodium chloride flush  3 mL Intravenous Q12H  . spironolactone  25 mg Oral Daily  . warfarin  2.5 mg Oral q1800  . Warfarin - Physician Dosing Inpatient   Does not apply q1800    Infusions: . sodium chloride    . sodium chloride 500 mL (04/20/19 0616)  . ceFEPime (MAXIPIME) IV    . furosemide (LASIX) infusion 10 mg/hr (04/20/19 0700)  . potassium chloride 10 mEq (04/20/19 0725)    PRN Medications: sodium chloride, acetaminophen, bisacodyl, Melatonin, metoprolol tartrate, morphine injection, ondansetron (ZOFRAN) IV, sodium chloride flush, sodium chloride flush   Assessment/Plan    1. Mitral Regurgitation, severe - ECHO/TEE with severe MR P2 prolapse - s/p complex MV repair 3/5 - Volume status much improved. CVP down to 5-6. Stop lasix drip.  - Start lasix 40 mg twice a day.   2. A/C Diastolic Heart Failure/pulmonary HTN - ECHO 03/2019 EF 60-65% grade II DD  - s/p MVR.  - CVP down to 5-6. Stop lasix drip. Start po lasix 40 mg twice a day.  - Renal function stable.   3. Acute postop hypoxic respiratory failure  Weaned down to 2 liters.  - Continue incentive spirometer.   4. Acute blood loss anemia - 8.5 expected blood loss.  - CBC in am.   5. H/o lupus and Sjogren's disease - followed at [facility] - her neck/ jaw pain in the area of the Stensen's duct may possibly be related to Sjogren's syndrome and salivary gland involvement.  - CT unremarkable.  - Completed 3 days of cefepime.   6.  Hypokalemia/hypomagnesemia. - Supp K  - Continue spironolactone 25 qd  7. GI (Nausea/Constipation) - abdomen distended.  - Continue  Reglan 5 q8h - give sorbitol 30 mL   8. PAF --Continue IV amio.  --Remains in A fib.  - Start on coumadin 04/19/19  - INR 1.2   Continue to mobilize.  Consult PT/OT   Length of Stay: [place], NP  04/20/2019, 8:48 AM  Advanced Heart Failure Team Pager [phone] (M-F; 7a - 4p)  Please  contact [facility] for night-coverage after hours (4p -7a ) and weekends on [email or web address]    Patient seen and examined with the above-signed Advanced Practice Provider and/or Housestaff. I personally reviewed laboratory data, imaging studies and relevant notes. I independently examined the patient and formulated  the important aspects of the plan. I have edited the note to reflect any of my changes or salient points. I have personally discussed the plan with the patient and/or family.  She is diuresed well.  CVP now down to 5-6.  Breathing is better.  She is off BiPAP.  He is having some abdominal discomfort and feels constipated.  She has not had a bowel movement.  Remains in A. fib.  Is on IV amiodarone.  Warfarin was started yesterday.  Hemoglobin stable at 8.5.  We will stop IV Lasix and change to p.o.  Continue IV amiodarone.  Will give IV Reglan and sorbitol.  Attempt to mobilize today.  Will consult PT.  She is progressing slowly.  Glori Bickers, MD  8:29 PM

## 2019-04-20 NOTE — Progress Notes (Signed)
Pharmacy Antibiotic Note  Rebecca Carr is a 69 y.o. female admitted on 04/10/2019. Pharmacy has been consulted for cefepime dosing for PNA. Patient was started on cefepime 2g IV q8h which is planned to end today. Today, Scr is 1.27 with CrCl of ~52 ml/min. WBC 15.6 which is stable. Afebrile.   Plan: Adjust cefepime to 2g IV q12h based on renal function (CrCl 30-60 ml/min) Monitor renal function and clinical progression   Height: 5\' 8"  (172.7 cm) Weight: 217 lb 13 oz (98.8 kg)(weighed twice, diuresed > 4 liters) IBW/kg (Calculated) : 63.9  Temp (24hrs), Avg:98 F (36.7 C), Min:97.6 F (36.4 C), Max:98.2 F (36.8 C)  Recent Labs  Lab 04/16/19 0431 04/16/19 1646 04/17/19 0445 04/17/19 0445 04/17/19 1500 04/18/19 0355 04/18/19 1408 04/19/19 0409 04/20/19 0324  WBC 15.9*  --  14.7*  --   --  15.2*  --  15.7* 15.6*  CREATININE 0.89   < > 1.10*   < > 1.01* 1.08* 1.09* 1.07* 1.27*   < > = values in this interval not displayed.    Estimated Creatinine Clearance: 52.1 mL/min (A) (by C-G formula based on SCr of 1.27 mg/dL (H)).    Allergies  Allergen Reactions  . Iodine Anaphylaxis  . Iohexol Anaphylaxis     Desc: Patient states she is allergic to iodine and had a "code blue" incident after an injection for some type of imaging study.   . Penicillins Rash    PCN IN LARGE DOSES. Did it involve swelling of the face/tongue/throat, SOB, or low BP? No Did it involve sudden or severe rash/hives, skin peeling, or any reaction on the inside of your mouth or nose? No Did you need to seek medical attention at a hospital or doctor's office? No When did it last happen? More than 10 years ago If all above answers are "NO", may proceed with cephalosporin use.     Antimicrobials this admission:  Ertapenem 3/4 > 3/5 Vancomycin 3/8 > 3/10 Cefepime 3/8 >   Dose adjustments this admission:  3/8: vancomycin 1500mg  IV q24h changed to vancomycin 1250mg  IV q24h based on renal function   3/12: cefepime 2g IV q8h changed to cefepime 2g IV q12h based on CrCl 59ml/min.   Microbiology results:  3/8 BCx x 2: ngtd 3/8 Resp Cx: ngtd 3/4 MRSA PCR: neg  Cristela Felt, PharmD PGY1 Pharmacy Resident [employer]: [phone]  04/20/2019 6:05 AM

## 2019-04-20 NOTE — Progress Notes (Signed)
Pt refuses to wear CPAP for the night.  

## 2019-04-20 NOTE — Evaluation (Signed)
Physical Therapy Evaluation Patient Details Name: Lilyonna Coxen MRN: [id number] DOB: [birth year] Today's Date: 04/20/2019   History of Present Illness  69 year old female with chronic respiratory failure 2/2 COPD, underlying history of severe mitral valve disease and pulmonary hypertension.  Underwent mitral valve repair on 3/5, extubated on 3/6 but developed progressive somnolence and hypercarbia and was reintubated. Extubated on 3/10.  Clinical Impression  Pt presents to PT with deficits in functional mobility, gait, balance, power, endurance, cognition. Pt is generally weak, requiring physical assistance to perform bed mobility and transfers, but does comply well with sternal precautions given verbal cues. Pt requiring verbal cues to slow gait and steady during turns. Pt will benefit from aggressive mobilization to improve activity tolerance, LE strength, and balance. PT anticipates pt will progress well and be able to discharge home with HHPT and assistance from spouse if the patient ambulates at least 3 times per day while hospitalized.    Follow Up Recommendations Home health PT;Supervision/Assistance - 24 hour    Equipment Recommendations  None recommended by PT(pt reports she can borrow RW from [facility])    Recommendations for Other Services       Precautions / Restrictions Precautions Precautions: Fall Precaution Comments: sternal Restrictions Weight Bearing Restrictions: Yes Other Position/Activity Restrictions: sternal      Mobility  Bed Mobility Overal bed mobility: Needs Assistance Bed Mobility: Supine to Sit;Sit to Supine     Supine to sit: Mod assist Sit to supine: Mod assist      Transfers Overall transfer level: Needs assistance Equipment used: (EVA walker) Transfers: Sit to/from Stand Sit to Stand: Mod assist         General transfer comment: posterior lean initially once completing stand, assist from PT to correct posterior  lean  Ambulation/Gait Ambulation/Gait assistance: Min guard Gait Distance (Feet): 100 Feet Assistive device: (EVA walker) Gait Pattern/deviations: Step-through pattern Gait velocity: functional Gait velocity interpretation: 1.31 - 2.62 ft/sec, indicative of limited community ambulator General Gait Details: pt with steady step through gait, PT providing verbal cues to slow gait speed durign turns for stability  Stairs            Wheelchair Mobility    Modified Rankin (Stroke Patients Only)       Balance Overall balance assessment: Needs assistance Sitting-balance support: Single extremity supported;Feet supported Sitting balance-Leahy Scale: Good Sitting balance - Comments: supervision   Standing balance support: Bilateral upper extremity supported Standing balance-Leahy Scale: Fair Standing balance comment: minG-close supervision with BUE support of EVA walker                             Pertinent Vitals/Pain Pain Assessment: No/denies pain    Home Living Family/patient expects to be discharged to:: Private residence Living Arrangements: Spouse/significant other;Children Available Help at Discharge: Family;Available 24 hours/day Type of Home: House Home Access: Stairs to enter Entrance Stairs-Rails: None Entrance Stairs-Number of Steps: 5 Home Layout: Two level;Laundry or work area in basement;Able to live on main level with bedroom/bathroom Home Equipment: Wheelchair - [employer] - single point      Prior Function Level of Independence: Independent               Journalist, newspaper        Extremity/Trunk Assessment   Upper Extremity Assessment Upper Extremity Assessment: Generalized weakness(sternal precaution restrictions)    Lower Extremity Assessment Lower Extremity Assessment: Generalized weakness    Cervical / Trunk Assessment Cervical / Trunk  Assessment: Normal  Communication   Communication: No difficulties  Cognition  Arousal/Alertness: Awake/alert Behavior During Therapy: WFL for tasks assessed/performed Overall Cognitive Status: Impaired/Different from baseline                                 General Comments: pt is alert and oriented and follows commands, reports some hallucinations, seeing bugs crawling on the floor      General Comments General comments (skin integrity, edema, etc.): VSS on 2L [place]    Exercises     Assessment/Plan    PT Assessment Patient needs continued PT services  PT Problem List Decreased strength;Decreased activity tolerance;Decreased balance;Decreased mobility;Decreased cognition;Decreased knowledge of use of DME;Decreased safety awareness;Cardiopulmonary status limiting activity;Decreased knowledge of precautions       PT Treatment Interventions DME instruction;Gait training;Stair training;Functional mobility training;Therapeutic activities;Therapeutic exercise;Balance training;Neuromuscular re-education;Patient/family education    PT Goals (Current goals can be found in the Care Plan section)  Acute Rehab PT Goals Patient Stated Goal: To improve mobility and return home PT Goal Formulation: With patient Time For Goal Achievement: 05/04/19 Potential to Achieve Goals: Good    Frequency Min 3X/week   Barriers to discharge        Co-evaluation               AM-PAC PT "6 Clicks" Mobility  Outcome Measure Help needed turning from your back to your side while in a flat bed without using bedrails?: A Little Help needed moving from lying on your back to sitting on the side of a flat bed without using bedrails?: A Lot Help needed moving to and from a bed to a chair (including a wheelchair)?: A Little Help needed standing up from a chair using your arms (e.g., wheelchair or bedside chair)?: A Lot Help needed to walk in hospital room?: A Little Help needed climbing 3-5 steps with a railing? : A Lot 6 Click Score: 15    End of Session Equipment  Utilized During Treatment: Oxygen Activity Tolerance: Patient tolerated treatment well Patient left: in bed;with call bell/phone within reach;with nursing/sitter in room Nurse Communication: Mobility status PT Visit Diagnosis: Unsteadiness on feet (R26.81)    Time: [id number] PT Time Calculation (min) (ACUTE ONLY): 27 min   Charges:   PT Evaluation $PT Eval Moderate Complexity: 1 Mod PT Treatments $Therapeutic Activity: 8-22 mins        Zenaida Niece, PT, DPT Acute Rehabilitation Pager: [phone]   Zenaida Niece 04/20/2019, 4:21 PM

## 2019-04-21 LAB — BASIC METABOLIC PANEL
Anion gap: 12 (ref 5–15)
BUN: 49 mg/dL — ABNORMAL HIGH (ref 8–23)
CO2: 37 mmol/L — ABNORMAL HIGH (ref 22–32)
Calcium: 9.4 mg/dL (ref 8.9–10.3)
Chloride: 85 mmol/L — ABNORMAL LOW (ref 98–111)
Creatinine, Ser: 1.41 mg/dL — ABNORMAL HIGH (ref 0.44–1.00)
GFR calc Af Amer: 44 mL/min — ABNORMAL LOW (ref >60)
GFR calc non Af Amer: 38 mL/min — ABNORMAL LOW (ref >60)
Glucose, Bld: 112 mg/dL — ABNORMAL HIGH (ref 70–99)
Potassium: 4 mmol/L (ref 3.5–5.1)
Sodium: 134 mmol/L — ABNORMAL LOW (ref 135–145)

## 2019-04-21 LAB — GLUCOSE, CAPILLARY
Glucose-Capillary: 110 mg/dL — ABNORMAL HIGH (ref 70–99)
Glucose-Capillary: 106 mg/dL — ABNORMAL HIGH (ref 70–99)
Glucose-Capillary: 99 mg/dL (ref 70–99)

## 2019-04-21 LAB — CBC
HCT: 29.1 % — ABNORMAL LOW (ref 36.0–46.0)
Hemoglobin: 9.1 g/dL — ABNORMAL LOW (ref 12.0–15.0)
MCH: 28.9 pg (ref 26.0–34.0)
MCHC: 31.3 g/dL (ref 30.0–36.0)
MCV: 92.4 fL (ref 80.0–100.0)
Platelets: 199 10*3/uL (ref 150–400)
RBC: 3.15 MIL/uL — ABNORMAL LOW (ref 3.87–5.11)
RDW: 13.6 % (ref 11.5–15.5)
WBC: 15.5 10*3/uL — ABNORMAL HIGH (ref 4.0–10.5)
nRBC: 0 % (ref 0.0–0.2)

## 2019-04-21 LAB — CULTURE, BLOOD (ROUTINE X 2)
Culture: NO GROWTH
Culture: NO GROWTH

## 2019-04-21 LAB — PROTIME-INR
INR: 1.2 (ref 0.8–1.2)
Prothrombin Time: 14.9 seconds (ref 11.4–15.2)

## 2019-04-21 LAB — MAGNESIUM: Magnesium: 2 mg/dL (ref 1.7–2.4)

## 2019-04-21 MED ORDER — FUROSEMIDE 40 MG PO TABS
40.0000 mg | ORAL_TABLET | Freq: Every day | ORAL | Status: DC
  Administered 2019-04-22: 40 mg via ORAL
  Filled 2019-04-21: qty 1

## 2019-04-21 NOTE — Progress Notes (Signed)
Physical Therapy Treatment Patient Details Name: Rebecca Carr MRN: [id number] DOB: 1951-01-22 Today's Date: 04/21/2019    History of Present Illness 69 year old female with chronic respiratory failure 2/2 COPD, underlying history of severe mitral valve disease and pulmonary hypertension.  Underwent mitral valve repair on 3/5, extubated on 3/6 but developed progressive somnolence and hypercarbia and was reintubated. Extubated on 3/10.    PT Comments    Pt tolerated treatment well with significant increase in ambulation tolerance and improved transfer technique with PT cues for forward lean. PT provides education on POC and progression of mobility, discussing progression to RW for ambulation with nursing staff tomorrow. Pt will benefit from continued aggressive mobilization to improve activity tolerance, balance, and LE strength.   Follow Up Recommendations  Home health PT;Supervision/Assistance - 24 hour     Equipment Recommendations  (pt owns necessary DME)    Recommendations for Other Services       Precautions / Restrictions Precautions Precautions: Fall Precaution Comments: sternal Restrictions Weight Bearing Restrictions: Yes Other Position/Activity Restrictions: sternal    Mobility  Bed Mobility Overal bed mobility: Needs Assistance Bed Mobility: Supine to Sit;Sit to Supine     Supine to sit: Min assist;Mod assist(modA to scoot to edge)        Transfers Overall transfer level: Needs assistance Equipment used: (EVA walker) Transfers: Sit to/from Stand Sit to Stand: Min assist         General transfer comment: PT provides verbal and tactile cues to facilitate forward trunk lean  Ambulation/Gait Ambulation/Gait assistance: Min guard Gait Distance (Feet): 400 Feet Assistive device: (EVA walker) Gait Pattern/deviations: Step-through pattern Gait velocity: functional Gait velocity interpretation: 1.31 - 2.62 ft/sec, indicative of limited community  ambulator General Gait Details: pt with steady but slowed step through gait, reduced stride length   Stairs             Wheelchair Mobility    Modified Rankin (Stroke Patients Only)       Balance Overall balance assessment: Needs assistance Sitting-balance support: No upper extremity supported;Feet supported Sitting balance-Leahy Scale: Good Sitting balance - Comments: supervision   Standing balance support: Bilateral upper extremity supported Standing balance-Leahy Scale: Fair Standing balance comment: close supervision with BUE support of EVA walker                            Cognition Arousal/Alertness: Awake/alert Behavior During Therapy: WFL for tasks assessed/performed Overall Cognitive Status: Impaired/Different from baseline                                 General Comments: pt hallucinating flip flops under bed, just a shadow of SCD pumps      Exercises      General Comments General comments (skin integrity, edema, etc.): pt on 2L [place] during session, VSS      Pertinent Vitals/Pain Pain Assessment: Faces Faces Pain Scale: Hurts little more Pain Location: incision Pain Descriptors / Indicators: Sore Pain Intervention(s): Limited activity within patient's tolerance    Home Living                      Prior Function            PT Goals (current goals can now be found in the care plan section) Acute Rehab PT Goals Patient Stated Goal: To improve mobility and return home Progress towards PT  goals: Progressing toward goals    Frequency    Min 3X/week      PT Plan      Co-evaluation              AM-PAC PT "6 Clicks" Mobility   Outcome Measure  Help needed turning from your back to your side while in a flat bed without using bedrails?: A Little Help needed moving from lying on your back to sitting on the side of a flat bed without using bedrails?: A Lot Help needed moving to and from a bed to a  chair (including a wheelchair)?: A Little Help needed standing up from a chair using your arms (e.g., wheelchair or bedside chair)?: A Little Help needed to walk in hospital room?: A Little Help needed climbing 3-5 steps with a railing? : A Lot 6 Click Score: 16    End of Session Equipment Utilized During Treatment: Oxygen Activity Tolerance: Patient tolerated treatment well Patient left: in chair;with call bell/phone within reach Nurse Communication: Mobility status PT Visit Diagnosis: Unsteadiness on feet (R26.81)     Time: [id number] PT Time Calculation (min) (ACUTE ONLY): 36 min  Charges:  $Gait Training: 8-22 mins $Therapeutic Activity: 8-22 mins                     Zenaida Niece, PT, DPT Acute Rehabilitation Pager: [phone]    Zenaida Niece 04/21/2019, 4:57 PM

## 2019-04-21 NOTE — Progress Notes (Signed)
Patient declines to sleep with CPAP HS tonight.

## 2019-04-21 NOTE — Progress Notes (Addendum)
Advanced Heart Failure Rounding Note  PCP-Cardiologist: No primary care provider on file.   Subjective:    S/p complex MV repair on 3/5   She feels much better today.  Has diuresed another 2 pounds and now weight back to baseline.  CVP 6-7.  Converted to sinus rhythm early this morning on IV amiodarone.  Is off all pressors.  Having periods of delirium but is quite clear currently.  She was able to walk with the nurse this morning around the unit.  Creatinine stable in the 1.3-1.4 range.  INR 1.2 on warfarin.   Objective:   Weight Range: 96.5 kg Body mass index is 32.35 kg/m.   Vital Signs:   Temp:  [98.2 F (36.8 C)-98.7 F (37.1 C)] 98.3 F (36.8 C) (03/13 1117) Resp:  [13-24] 18 (03/13 1109) BP: (96-142)/(28-106) 103/47 (03/13 1109) SpO2:  [84 %-100 %] 99 % (03/13 1109) FiO2 (%):  [28 %] 28 % (03/12 1306) Weight:  [96.5 kg] 96.5 kg (03/13 0500) Last BM Date: (PTA)  Weight change: Filed Weights   04/19/19 0400 04/20/19 0700 04/21/19 0500  Weight: 98.8 kg 97.1 kg 96.5 kg    Intake/Output:   Intake/Output Summary (Last 24 hours) at 04/21/2019 1227 Last data filed at 04/21/2019 1100 Gross per 24 hour  Intake 189.17 ml  Output 1900 ml  Net -1710.83 ml      Physical Exam    General: Sitting up in bed No resp difficulty HEENT: normal Neck: supple. no JVD. Carotids 2+ bilat; no bruits. No lymphadenopathy or thryomegaly appreciated. Cor: PMI nondisplaced. Regular rate & rhythm. No rubs, gallops or murmurs. Lungs: dull at bases Abdomen: soft, nontender, nondistended. No hepatosplenomegaly. No bruits or masses. Good bowel sounds. Extremities: no cyanosis, clubbing, rash, 1+ edema Neuro: alert & orientedx3, cranial nerves grossly intact. moves all 4 extremities w/o difficulty. Affect pleasant   Telemetry   NSR 90s Personally reviewed  Labs    CBC Recent Labs    04/20/19 0324 04/20/19 0324 04/20/19 0352 04/21/19 0504  WBC 15.6*  --   --  15.5*  HGB  8.5*   < > 9.5* 9.1*  HCT 26.9*   < > 28.0* 29.1*  MCV 91.8  --   --  92.4  PLT 187  --   --  199   < > = values in this interval not displayed.   Basic Metabolic Panel Recent Labs    04/20/19 1307 04/21/19 0504  NA 130* 134*  K 3.3* 4.0  CL 85* 85*  CO2 31 37*  GLUCOSE 276* 112*  BUN 44* 49*  CREATININE 1.29* 1.41*  CALCIUM 8.0* 9.4  MG 1.9 2.0   Liver Function Tests No results for input(s): AST, ALT, ALKPHOS, BILITOT, PROT, ALBUMIN in the last 72 hours. No results for input(s): LIPASE, AMYLASE in the last 72 hours. Cardiac Enzymes No results for input(s): CKTOTAL, CKMB, CKMBINDEX, TROPONINI in the last 72 hours.  BNP: BNP (last 3 results) No results for input(s): BNP in the last 8760 hours.  ProBNP (last 3 results) No results for input(s): PROBNP in the last 8760 hours.   D-Dimer No results for input(s): DDIMER in the last 72 hours. Hemoglobin A1C No results for input(s): HGBA1C in the last 72 hours. Fasting Lipid Panel No results for input(s): CHOL, HDL, LDLCALC, TRIG, CHOLHDL, LDLDIRECT in the last 72 hours. Thyroid Function Tests No results for input(s): TSH, T4TOTAL, T3FREE, THYROIDAB in the last 72 hours.  Invalid input(s): FREET3  Other results:   Imaging    No results found.   Medications:     Scheduled Medications: . amiodarone  400 mg Oral BID  . aspirin EC  81 mg Oral QHS  . [START ON 04/22/2019] atorvastatin  40 mg Oral QHS  . budesonide (PULMICORT) nebulizer solution  0.5 mg Nebulization BID  . chlorhexidine  15 mL Mouth Rinse BID  . Chlorhexidine Gluconate Cloth  6 each Topical Daily  . [place] [facility], Patient & Family Education   Does not apply Once  . docusate sodium  200 mg Oral Daily  . feeding supplement  1 Container Oral TID BM  . FLUoxetine  20 mg Oral q1800  . insulin aspart  0-20 Units Subcutaneous TID WC  . insulin aspart  0-5 Units Subcutaneous QHS  . levothyroxine  125 mcg Oral QAC breakfast  . liothyronine   5 mcg Oral Daily  . mouth rinse  15 mL Mouth Rinse q12n4p  . metoprolol tartrate  12.5 mg Oral BID  . pantoprazole  40 mg Oral Daily  . polyethylene glycol  17 g Oral BID  . senna-docusate  1 tablet Oral BID  . sodium chloride flush  3 mL Intravenous Q12H  . spironolactone  25 mg Oral Daily  . warfarin  2.5 mg Oral q1800  . Warfarin - Physician Dosing Inpatient   Does not apply q1800    Infusions: . sodium chloride    . sodium chloride Stopped (04/20/19 1229)    PRN Medications: sodium chloride, acetaminophen, bisacodyl, Melatonin, metoprolol tartrate, morphine injection, ondansetron (ZOFRAN) IV, sodium chloride flush, sodium chloride flush   Assessment/Plan    1. Mitral Regurgitation, severe - ECHO/TEE with severe MR P2 prolapse - s/p complex MV repair 3/5 - stable  2. A/C Diastolic Heart Failure/pulmonary HTN - ECHO 03/2019 EF 60-65% grade II DD  - s/p MVR.  - Volume status much improved. CVP 5-6.  Weight back to baseline. -Still with some peripheral edema. -Start Lasix 40 mg daily p.o. continue spironolactone. -Creatinine stable in the 1.3-1.4 range.  3. Acute postop hypoxic respiratory failure -Much improved.  Back to baseline. -Continue incentive spirometry. -Refuses nighttime BiPAP.  4. Acute blood loss anemia -Hemoglobin stable at 9.1.  5. H/o lupus and Sjogren's disease - followed at [facility] - her neck/ jaw pain in the area of the Stensen's duct may possibly be related to Sjogren's syndrome and salivary gland involvement.  - CT unremarkable.  - Completed 3 days of cefepime.   6.  Hypokalemia/hypomagnesemia. -K4.0 this morning - Continue spironolactone 25 qd  7. PAF -She is status post LAA clipping  - converted to sinus rhythm this morning. -Now on p.o. amiodarone - Started on coumadin 04/19/19. Discussed dosing with PharmD personally. - INR 1.2   8. Post-op delirium -Intermittent.  Continue to orient.  May be stable for 4E. later  today or tomorrow.  Discussed with Dr. Roxy Manns.    Length of Stay: 56  Glori Bickers, MD  04/21/2019, 12:27 PM  Advanced Heart Failure Team Pager [phone] (M-F; 7a - 4p)  Please contact [facility] for night-coverage after hours (4p -7a ) and weekends on [email or web address]

## 2019-04-22 ENCOUNTER — Inpatient Hospital Stay (HOSPITAL_COMMUNITY): Payer: Medicare Other

## 2019-04-22 LAB — BASIC METABOLIC PANEL
Anion gap: 14 (ref 5–15)
BUN: 53 mg/dL — ABNORMAL HIGH (ref 8–23)
CO2: 36 mmol/L — ABNORMAL HIGH (ref 22–32)
Calcium: 9.6 mg/dL (ref 8.9–10.3)
Chloride: 81 mmol/L — ABNORMAL LOW (ref 98–111)
Creatinine, Ser: 1.62 mg/dL — ABNORMAL HIGH (ref 0.44–1.00)
GFR calc Af Amer: 37 mL/min — ABNORMAL LOW (ref >60)
GFR calc non Af Amer: 32 mL/min — ABNORMAL LOW (ref >60)
Glucose, Bld: 123 mg/dL — ABNORMAL HIGH (ref 70–99)
Potassium: 3.8 mmol/L (ref 3.5–5.1)
Sodium: 131 mmol/L — ABNORMAL LOW (ref 135–145)

## 2019-04-22 LAB — GLUCOSE, CAPILLARY
Glucose-Capillary: 117 mg/dL — ABNORMAL HIGH (ref 70–99)
Glucose-Capillary: 131 mg/dL — ABNORMAL HIGH (ref 70–99)
Glucose-Capillary: 120 mg/dL — ABNORMAL HIGH (ref 70–99)
Glucose-Capillary: 106 mg/dL — ABNORMAL HIGH (ref 70–99)

## 2019-04-22 LAB — PROTIME-INR
INR: 1.2 (ref 0.8–1.2)
Prothrombin Time: 15.2 seconds (ref 11.4–15.2)

## 2019-04-22 MED ORDER — POTASSIUM CHLORIDE CRYS ER 20 MEQ PO TBCR
40.0000 meq | EXTENDED_RELEASE_TABLET | Freq: Once | ORAL | Status: AC
  Administered 2019-04-22: 40 meq via ORAL
  Filled 2019-04-22: qty 2

## 2019-04-22 MED ORDER — OLANZAPINE 5 MG PO TBDP
5.0000 mg | ORAL_TABLET | Freq: Every day | ORAL | Status: DC
  Administered 2019-04-22 – 2019-04-26 (×5): 5 mg via ORAL
  Filled 2019-04-22 (×6): qty 1

## 2019-04-22 MED ORDER — FUROSEMIDE 10 MG/ML IJ SOLN
80.0000 mg | Freq: Once | INTRAMUSCULAR | Status: AC
  Administered 2019-04-22: 80 mg via INTRAVENOUS
  Filled 2019-04-22: qty 8

## 2019-04-22 MED ORDER — AMIODARONE HCL 200 MG PO TABS
200.0000 mg | ORAL_TABLET | Freq: Two times a day (BID) | ORAL | Status: DC
  Administered 2019-04-22 – 2019-04-26 (×8): 200 mg via ORAL
  Filled 2019-04-22 (×8): qty 1
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
## 2019-04-22 NOTE — Progress Notes (Signed)
9 Days Post-Op Procedure(s) (LRB): MITRAL VALVE REPAIR (MVR) using Memo 4D 34 MM Mitral Valve. (N/A) TRANSESOPHAGEAL ECHOCARDIOGRAM (TEE) (N/A) Clipping Of Atrial Appendage using AtriCure 45 MM AtriClip. (N/A) Subjective: No complaints  Objective: Vital signs in last 24 hours: Temp:  [98.1 F (36.7 C)-99.2 F (37.3 C)] 98.1 F (36.7 C) (03/14 0400) Pulse Rate:  [96] 96 (03/13 1956) Cardiac Rhythm: Normal sinus rhythm;Sinus tachycardia (03/14 0800) Resp:  [12-25] 20 (03/14 0800) BP: (77-122)/(41-79) 103/79 (03/14 0800) SpO2:  [87 %-100 %] 97 % (03/14 0920) Weight:  [96.2 kg] 96.2 kg (03/14 0426)  Hemodynamic parameters for last 24 hours: CVP:  [7 mmHg-10 mmHg] 7 mmHg  Intake/Output from previous day: 03/13 0701 - 03/14 0700 In: 960 [P.O.:960] Out: 2150 [Urine:2150] Intake/Output this shift: Total I/O In: 360 [P.O.:360] Out: -   General appearance: alert and cooperative Neurologic: intact Heart: regular rate and rhythm, S1, S2 normal, no murmur, click, rub or gallop Lungs: clear to auscultation bilaterally Abdomen: soft, non-tender; bowel sounds normal; no masses,  no organomegaly Extremities: extremities normal, atraumatic, no cyanosis or edema Wound: c/d/i  Lab Results: Recent Labs    04/20/19 0324 04/20/19 0324 04/20/19 0352 04/21/19 0504  WBC 15.6*  --   --  15.5*  HGB 8.5*   < > 9.5* 9.1*  HCT 26.9*   < > 28.0* 29.1*  PLT 187  --   --  199   < > = values in this interval not displayed.   BMET:  Recent Labs    04/20/19 1307 04/21/19 0504  NA 130* 134*  K 3.3* 4.0  CL 85* 85*  CO2 31 37*  GLUCOSE 276* 112*  BUN 44* 49*  CREATININE 1.29* 1.41*  CALCIUM 8.0* 9.4    PT/INR:  Recent Labs    04/22/19 0627  LABPROT 15.2  INR 1.2   ABG    Component Value Date/Time   PHART 7.482 (H) 04/20/2019 0352   HCO3 41.3 (H) 04/20/2019 0352   TCO2 43 (H) 04/20/2019 0352   O2SAT 93.0 04/20/2019 0352   CBG (last 3)  Recent Labs    04/21/19 1116  04/21/19 1521 04/22/19 0659  GLUCAP 106* 99 117*    Assessment/Plan: S/P Procedure(s) (LRB): MITRAL VALVE REPAIR (MVR) using Memo 4D 34 MM Mitral Valve. (N/A) TRANSESOPHAGEAL ECHOCARDIOGRAM (TEE) (N/A) Clipping Of Atrial Appendage using AtriCure 45 MM AtriClip. (N/A) Mobilize Diuresis See progression orders   LOS: 12 days    Wonda Olds 04/22/2019

## 2019-04-22 NOTE — Plan of Care (Signed)
  Problem: Education: Goal: Knowledge of General Education information will improve Description Including pain rating scale, medication(s)/side effects and non-pharmacologic comfort measures Outcome: Progressing   Problem: Health Behavior/Discharge Planning: Goal: Ability to manage health-related needs will improve Outcome: Progressing   

## 2019-04-22 NOTE — Progress Notes (Signed)
Advanced Heart Failure Rounding Note  PCP-Cardiologist: No primary care provider on file.   Subjective:    S/p complex MV repair on 3/5   She is now on 4E. Denies SOB.  Nurses says she is not sleeping. She appears delirious and says she sees snakes but then can refocus and tell me what day it is and what was done to her in the hospital.   CVP 10    Objective:   Weight Range: 96.2 kg Body mass index is 32.25 kg/m.   Vital Signs:   Temp:  [98.1 F (36.7 C)-99.2 F (37.3 C)] 98.1 F (36.7 C) (03/14 0400) Pulse Rate:  [96] 96 (03/13 1956) Resp:  [12-25] 14 (03/14 1000) BP: (97-125)/(50-84) 119/67 (03/14 1000) SpO2:  [92 %-100 %] 98 % (03/14 1000) Weight:  [96.2 kg] 96.2 kg (03/14 0426) Last BM Date: (PTA)  Weight change: Filed Weights   04/20/19 0700 04/21/19 0500 04/22/19 0426  Weight: 97.1 kg 96.5 kg 96.2 kg    Intake/Output:   Intake/Output Summary (Last 24 hours) at 04/22/2019 1444 Last data filed at 04/22/2019 0800 Gross per 24 hour  Intake 720 ml  Output 1250 ml  Net -530 ml      Physical Exam    General:  Restless in bed  HEENT: normal Neck: supple. JVP to jaw Carotids 2+ bilat; no bruits. No lymphadenopathy or thryomegaly appreciated. Cor: sternal wound PMI nondisplaced. Regular rate & rhythm. No rubs, gallops or murmurs. Lungs: clear Abdomen: obese soft, nontender, nondistended. No hepatosplenomegaly. No bruits or masses. Good bowel sounds. Extremities: no cyanosis, clubbing, rash, 2+ edema Neuro: alert & orientedx3, cranial nerves grossly intact. moves all 4 extremities w/o difficulty. Affect pleasant  Telemetry   NSR 90-100 Personally reviewed  Labs    CBC Recent Labs    04/20/19 0324 04/20/19 0324 04/20/19 0352 04/21/19 0504  WBC 15.6*  --   --  15.5*  HGB 8.5*   < > 9.5* 9.1*  HCT 26.9*   < > 28.0* 29.1*  MCV 91.8  --   --  92.4  PLT 187  --   --  199   < > = values in this interval not displayed.   Basic Metabolic  Panel Recent Labs    04/20/19 1307 04/20/19 1307 04/21/19 0504 04/22/19 1046  NA 130*   < > 134* 131*  K 3.3*   < > 4.0 3.8  CL 85*   < > 85* 81*  CO2 31   < > 37* 36*  GLUCOSE 276*   < > 112* 123*  BUN 44*   < > 49* 53*  CREATININE 1.29*   < > 1.41* 1.62*  CALCIUM 8.0*   < > 9.4 9.6  MG 1.9  --  2.0  --    < > = values in this interval not displayed.   Liver Function Tests No results for input(s): AST, ALT, ALKPHOS, BILITOT, PROT, ALBUMIN in the last 72 hours. No results for input(s): LIPASE, AMYLASE in the last 72 hours. Cardiac Enzymes No results for input(s): CKTOTAL, CKMB, CKMBINDEX, TROPONINI in the last 72 hours.  BNP: BNP (last 3 results) No results for input(s): BNP in the last 8760 hours.  ProBNP (last 3 results) No results for input(s): PROBNP in the last 8760 hours.   D-Dimer No results for input(s): DDIMER in the last 72 hours. Hemoglobin A1C No results for input(s): HGBA1C in the last 72 hours. Fasting Lipid Panel No results for  input(s): CHOL, HDL, LDLCALC, TRIG, CHOLHDL, LDLDIRECT in the last 72 hours. Thyroid Function Tests No results for input(s): TSH, T4TOTAL, T3FREE, THYROIDAB in the last 72 hours.  Invalid input(s): FREET3  Other results:   Imaging    DG Chest 1 View  Result Date: 04/22/2019 CLINICAL DATA:  Postoperative EXAM: CHEST  1 VIEW COMPARISON:  04/20/2019 FINDINGS: Slight interval improvement in heterogeneous airspace opacity of the lung bases, which may reflect improved atelectasis. Cardiomegaly with mitral valve prosthesis and left atrial appendage clip. Right upper extremity PICC. IMPRESSION: Slight interval improvement in bibasilar airspace opacities, which may reflect improved atelectasis. Electronically Signed   By: Eddie Candle M.D.   On: 04/22/2019 10:33     Medications:     Scheduled Medications: . amiodarone  400 mg Oral BID  . aspirin EC  81 mg Oral QHS  . atorvastatin  40 mg Oral QHS  . budesonide (PULMICORT)  nebulizer solution  0.5 mg Nebulization BID  . chlorhexidine  15 mL Mouth Rinse BID  . Chlorhexidine Gluconate Cloth  6 each Topical Daily  . [place] [facility], Patient & Family Education   Does not apply Once  . docusate sodium  200 mg Oral Daily  . feeding supplement  1 Container Oral TID BM  . FLUoxetine  20 mg Oral q1800  . furosemide  40 mg Oral Daily  . insulin aspart  0-20 Units Subcutaneous TID WC  . insulin aspart  0-5 Units Subcutaneous QHS  . levothyroxine  125 mcg Oral QAC breakfast  . liothyronine  5 mcg Oral Daily  . mouth rinse  15 mL Mouth Rinse q12n4p  . metoprolol tartrate  12.5 mg Oral BID  . pantoprazole  40 mg Oral Daily  . polyethylene glycol  17 g Oral BID  . senna-docusate  1 tablet Oral BID  . spironolactone  25 mg Oral Daily  . warfarin  2.5 mg Oral q1800  . Warfarin - Physician Dosing Inpatient   Does not apply q1800    Infusions: . sodium chloride Stopped (04/20/19 1229)    PRN Medications: sodium chloride, acetaminophen, bisacodyl, Melatonin, metoprolol tartrate, ondansetron (ZOFRAN) IV, sodium chloride flush   Assessment/Plan    1. Mitral Regurgitation, severe - ECHO/TEE with severe MR P2 prolapse - s/p complex MV repair 3/5 - stable  2. A/C Diastolic Heart Failure/pulmonary HTN - ECHO 03/2019 EF 60-65% grade II DD  - s/p MVR.  - Volume status improved. Weight back to baseline. - However CVP 10 and still with some peripheral edema. - give one dose IV lasix today -Creatinine up slightly 1.4 -> 1.6  3. Acute postop hypoxic respiratory failure -Much improved.  Back to baseline. -Continue incentive spirometry. -Refuses nighttime BiPAP.  4. Acute blood loss anemia -Hemoglobin stable. No CBC stable  5. H/o lupus and Sjogren's disease - followed at [facility] - her neck/ jaw pain in the area of the Stensen's duct may possibly be related to Sjogren's syndrome and salivary gland involvement.  - CT unremarkable.  -  Completed 3 days of cefepime.   6.  Hypokalemia/hypomagnesemia. - K 3.8 this morning wil supp  - Continue spironolactone 25 qd  7. PAF -She is status post LAA clipping  - converted to sinus rhythm on 3/13 -Now on p.o. amiodarone. Will decrease dose to 200 bid.  - Started on coumadin 04/19/19.  - INR 1.2 Dosing per TCTS  8. Post-op delirium - seems worse today. Will start zyprexa. Discussed dosing with PharmD personally.  Length of Stay: [place], MD  04/22/2019, 2:44 PM  Advanced Heart Failure Team Pager [phone] (M-F; 7a - 4p)  Please contact [facility] for night-coverage after hours (4p -7a ) and weekends on [email or web address]

## 2019-04-22 NOTE — Progress Notes (Signed)
RT visited pt with treatment at this time.  Pt is not aware of where she is at all times and is speaking about seeing a dog in the room.  Pt has been saying things that are not appropriate and hard to understand.  At this time, d/t confusion, RT will hold cpap.  RT will speak with RN as well.

## 2019-04-22 NOTE — Plan of Care (Signed)
  Problem: Clinical Measurements: Goal: Respiratory complications will improve Outcome: Progressing Goal: Cardiovascular complication will be avoided Outcome: Progressing   Problem: Activity: Goal: Risk for activity intolerance will decrease Outcome: Progressing   Problem: Safety: Goal: Ability to remain free from injury will improve Outcome: Not Progressing Note: Pt increasingly more agitated and hallucinatory.

## 2019-04-23 LAB — BASIC METABOLIC PANEL
Anion gap: 13 (ref 5–15)
BUN: 50 mg/dL — ABNORMAL HIGH (ref 8–23)
CO2: 37 mmol/L — ABNORMAL HIGH (ref 22–32)
Calcium: 9.4 mg/dL (ref 8.9–10.3)
Chloride: 85 mmol/L — ABNORMAL LOW (ref 98–111)
Creatinine, Ser: 1.67 mg/dL — ABNORMAL HIGH (ref 0.44–1.00)
GFR calc Af Amer: 36 mL/min — ABNORMAL LOW (ref >60)
GFR calc non Af Amer: 31 mL/min — ABNORMAL LOW (ref >60)
Glucose, Bld: 80 mg/dL (ref 70–99)
Potassium: 4 mmol/L (ref 3.5–5.1)
Sodium: 135 mmol/L (ref 135–145)

## 2019-04-23 LAB — GLUCOSE, CAPILLARY
Glucose-Capillary: 99 mg/dL (ref 70–99)
Glucose-Capillary: 143 mg/dL — ABNORMAL HIGH (ref 70–99)
Glucose-Capillary: 78 mg/dL (ref 70–99)
Glucose-Capillary: 140 mg/dL — ABNORMAL HIGH (ref 70–99)

## 2019-04-23 LAB — PROTIME-INR
INR: 1.4 — ABNORMAL HIGH (ref 0.8–1.2)
Prothrombin Time: 16.6 seconds — ABNORMAL HIGH (ref 11.4–15.2)

## 2019-04-23 MED ORDER — HALOPERIDOL LACTATE 5 MG/ML IJ SOLN
5.0000 mg | Freq: Once | INTRAMUSCULAR | Status: AC
  Administered 2019-04-23: 5 mg via INTRAVENOUS
  Filled 2019-04-23: qty 1

## 2019-04-23 NOTE — Progress Notes (Addendum)
Advanced Heart Failure Rounding Note  PCP-Cardiologist: No primary care provider on file.   Subjective:    S/p complex MV repair on 3/5   Post op course c/b by delirium. Per nursing report, pt extremely agitated overnight. Continued w/ hallucinations. Sleeping currently.   CVP 9    Objective:   Weight Range: 97.4 kg Body mass index is 32.65 kg/m.   Vital Signs:   Temp:  [97.6 F (36.4 C)-98.1 F (36.7 C)] 98.1 F (36.7 C) (03/15 0529) Pulse Rate:  [54-95] 95 (03/15 0000) Resp:  [14-29] 23 (03/15 0528) BP: (98-134)/(59-84) 98/62 (03/15 0000) SpO2:  [92 %-100 %] 98 % (03/15 0755) Weight:  [97.4 kg] 97.4 kg (03/15 0529) Last BM Date: (PTA)  Weight change: Filed Weights   04/21/19 0500 04/22/19 0426 04/23/19 0529  Weight: 96.5 kg 96.2 kg 97.4 kg    Intake/Output:   Intake/Output Summary (Last 24 hours) at 04/23/2019 0758 Last data filed at 04/22/2019 0800 Gross per 24 hour  Intake 360 ml  Output --  Net 360 ml      Physical Exam    PHYSICAL EXAM: CVP 9 General:  Elderly WF, sleeping/sorning. No respiratory difficulty HEENT: normal anicteric  Neck: supple. Thick neck, JVD assessment difficult. Carotids 2+ bilat; no bruits. No lymphadenopathy or thyromegaly appreciated. Cor: PMI nondisplaced. Regular rate & rhythm. No rubs, gallops or murmurs. Lungs: clear no wheeze  Abdomen: soft, nontender, nondistended. No hepatosplenomegaly. No bruits or masses. Good bowel sounds. Extremities: no cyanosis, clubbing, rash, trace edema + RUE PICC Neuro: post-op delirium/ hallucinations overnight. currently sleeping  Telemetry   NSR 80s-90s Personally reviewed  Labs    CBC Recent Labs    04/21/19 0504  WBC 15.5*  HGB 9.1*  HCT 29.1*  MCV 92.4  PLT [id number]   Basic Metabolic Panel Recent Labs    04/20/19 1307 04/20/19 1307 04/21/19 0504 04/22/19 1046  NA 130*   < > 134* 131*  K 3.3*   < > 4.0 3.8  CL 85*   < > 85* 81*  CO2 31   < > 37* 36*  GLUCOSE 276*    < > 112* 123*  BUN 44*   < > 49* 53*  CREATININE 1.29*   < > 1.41* 1.62*  CALCIUM 8.0*   < > 9.4 9.6  MG 1.9  --  2.0  --    < > = values in this interval not displayed.   Liver Function Tests No results for input(s): AST, ALT, ALKPHOS, BILITOT, PROT, ALBUMIN in the last 72 hours. No results for input(s): LIPASE, AMYLASE in the last 72 hours. Cardiac Enzymes No results for input(s): CKTOTAL, CKMB, CKMBINDEX, TROPONINI in the last 72 hours.  BNP: BNP (last 3 results) No results for input(s): BNP in the last 8760 hours.  ProBNP (last 3 results) No results for input(s): PROBNP in the last 8760 hours.   D-Dimer No results for input(s): DDIMER in the last 72 hours. Hemoglobin A1C No results for input(s): HGBA1C in the last 72 hours. Fasting Lipid Panel No results for input(s): CHOL, HDL, LDLCALC, TRIG, CHOLHDL, LDLDIRECT in the last 72 hours. Thyroid Function Tests No results for input(s): TSH, T4TOTAL, T3FREE, THYROIDAB in the last 72 hours.  Invalid input(s): FREET3  Other results:   Imaging    DG Chest 1 View  Result Date: 04/22/2019 CLINICAL DATA:  Postoperative EXAM: CHEST  1 VIEW COMPARISON:  04/20/2019 FINDINGS: Slight interval improvement in heterogeneous airspace opacity of the  lung bases, which may reflect improved atelectasis. Cardiomegaly with mitral valve prosthesis and left atrial appendage clip. Right upper extremity PICC. IMPRESSION: Slight interval improvement in bibasilar airspace opacities, which may reflect improved atelectasis. Electronically Signed   By: Eddie Candle M.D.   On: 04/22/2019 10:33     Medications:     Scheduled Medications: . amiodarone  200 mg Oral BID  . aspirin EC  81 mg Oral QHS  . atorvastatin  40 mg Oral QHS  . budesonide (PULMICORT) nebulizer solution  0.5 mg Nebulization BID  . chlorhexidine  15 mL Mouth Rinse BID  . Chlorhexidine Gluconate Cloth  6 each Topical Daily  . [place] [facility], Patient & Family  Education   Does not apply Once  . docusate sodium  200 mg Oral Daily  . feeding supplement  1 Container Oral TID BM  . FLUoxetine  20 mg Oral q1800  . furosemide  40 mg Oral Daily  . insulin aspart  0-20 Units Subcutaneous TID WC  . insulin aspart  0-5 Units Subcutaneous QHS  . levothyroxine  125 mcg Oral QAC breakfast  . liothyronine  5 mcg Oral Daily  . mouth rinse  15 mL Mouth Rinse q12n4p  . metoprolol tartrate  12.5 mg Oral BID  . OLANZapine zydis  5 mg Oral Daily  . pantoprazole  40 mg Oral Daily  . polyethylene glycol  17 g Oral BID  . senna-docusate  1 tablet Oral BID  . spironolactone  25 mg Oral Daily  . warfarin  2.5 mg Oral q1800  . Warfarin - Physician Dosing Inpatient   Does not apply q1800    Infusions: . sodium chloride Stopped (04/20/19 1229)    PRN Medications: sodium chloride, acetaminophen, bisacodyl, Melatonin, metoprolol tartrate, ondansetron (ZOFRAN) IV, sodium chloride flush   Assessment/Plan    1. Mitral Regurgitation, severe - ECHO/TEE with severe MR P2 prolapse - s/p complex MV repair 3/5 - stable  2. A/C Diastolic Heart Failure/pulmonary HTN - ECHO 03/2019 EF 60-65% grade II DD  - s/p MVR.  - Volume status improved. Weight back to baseline. - CVP 9. Continue PO lasix, 40 qd - Follow BMP given AKI    3. Acute postop hypoxic respiratory failure -Much improved.  Back to baseline. -Continue incentive spirometry. -Refuses nighttime BiPAP.  4. Acute blood loss anemia -Hemoglobin stable. No CBC stable  5. H/o lupus and Sjogren's disease - followed at [facility] - her neck/ jaw pain in the area of the Stensen's duct may possibly be related to Sjogren's syndrome and salivary gland involvement.  - CT unremarkable.  - Completed 3 days of cefepime.   6.  Hypokalemia/hypomagnesemia. - Check BMP today  - Continue spironolactone 25 qd  7. PAF - She is status post LAA clipping  - converted to sinus rhythm on 3/13 - Now on p.o.  amiodarone, 200 bid.  - Started on coumadin 04/19/19.  - INR 1.4. Dosing per TCTS  8. Post-op delirium -continued w/ agitation/ hallucinations overnight  - continue Zyprexa   9. AKI:  - baseline SCr <1.0 - gradual increase, up to 1.62 yesterday - will repeat BMP today  - she is back to her preop wt. If further increase, may need to hold Lasix      Length of Stay: [address], PA-C  04/23/2019, 7:58 AM  Advanced Heart Failure Team Pager [phone] (M-F; 7a - 4p)  Please contact [facility] for night-coverage after hours (4p -7a )  and weekends on [email or web address]  Patient seen and examined with the above-signed Advanced Practice Provider and/or Housestaff. I personally reviewed laboratory data, imaging studies and relevant notes. I independently examined the patient and formulated the important aspects of the plan. I have edited the note to reflect any of my changes or salient points. I have personally discussed the plan with the patient and/or family.  Volume status back to baseline. Creatinine up. Will hold diuretics for now. Remains in NSR on po amio. INR 1.4. TCTC dosing warfarin. Delirium is major issue. Zyprexa started over the weekend. If not improving can adjust meds.   Will hold lasix. Continue to load amio. Continue zyprexa. Continue to orient.   Glori Bickers, MD  8:33 AM

## 2019-04-23 NOTE — Progress Notes (Signed)
Patient refused CPAP.

## 2019-04-23 NOTE — Progress Notes (Signed)
Physical Therapy Treatment Patient Details Name: Rebecca Carr MRN: [id number] DOB: Feb 28, 1950 Today's Date: 04/23/2019    History of Present Illness 69 year old female with chronic respiratory failure 2/2 COPD, underlying history of severe mitral valve disease and pulmonary hypertension.  Underwent mitral valve repair on 3/5, extubated on 3/6 but developed progressive somnolence and hypercarbia and was reintubated. Extubated on 3/10.    PT Comments    Patient seen for mobility progression. Pt tolerated gait distance of 120 ft with RW this session. Gait distance limited by c/o dizziness. 2L O2 via [place] required to maintain SpO2 90% while ambulating. Min guard/min A required for gait training and mod A for functional transfer training from recliner maintaining sternal precautions. Pt will continue to benefit from further skilled PT services to maximize independence and safety with mobility.     Follow Up Recommendations  Home health PT;Supervision/Assistance - 24 hour     Equipment Recommendations  None recommended by PT(pt reports she will borrow RW from [facility])    Recommendations for Other Services       Precautions / Restrictions Precautions Precautions: Fall Precaution Comments: sternal Restrictions Weight Bearing Restrictions: Yes(sternal precautions)    Mobility  Bed Mobility               General bed mobility comments: pt OOB in chair upon arrival  Transfers Overall transfer level: Needs assistance Equipment used: Rolling walker (2 wheeled) Transfers: Sit to/from Stand Sit to Stand: Mod assist         General transfer comment: cues for hand placement; use of momentum and assist to power up into standing; use of RW upon standing; pt stood from recliner  Ambulation/Gait Ambulation/Gait assistance: Min guard;Min assist Gait Distance (Feet): 120 Feet Assistive device: Rolling walker (2 wheeled) Gait Pattern/deviations: Step-through pattern;Decreased stride  length;Trunk flexed Gait velocity: decreased   General Gait Details: cues for safe use of AD and assist to steady and manage RW; pt running into objects in room and in hallway with RW; pt with c/o dizziness while ambulating so gait distance limited   Stairs             Wheelchair Mobility    Modified Rankin (Stroke Patients Only)       Balance Overall balance assessment: Needs assistance Sitting-balance support: No upper extremity supported;Feet supported Sitting balance-Leahy Scale: Good     Standing balance support: Bilateral upper extremity supported Standing balance-Leahy Scale: Poor                              Cognition Arousal/Alertness: Awake/alert Behavior During Therapy: WFL for tasks assessed/performed Overall Cognitive Status: Impaired/Different from baseline                                 General Comments: no hallucinations during session and pt oriented to place and time and demonstrating recall of sternal precautions however tangential at times and likely not at baseline but no family present      Exercises      General Comments General comments (skin integrity, edema, etc.): SpO2 94% on RA at rest and desat to 85% on while ambulating and back up to 90% or > on 2L O2 via [place]      Pertinent Vitals/Pain Pain Assessment: Faces Faces Pain Scale: Hurts little more Pain Location: chest with mobility  Pain Descriptors / Indicators: Sore Pain Intervention(s):  Monitored during session    Home Living                      Prior Function            PT Goals (current goals can now be found in the care plan section) Acute Rehab PT Goals Patient Stated Goal: To improve mobility and return home Progress towards PT goals: Progressing toward goals    Frequency    Min 3X/week      PT Plan Current plan remains appropriate    Co-evaluation              AM-PAC PT "6 Clicks" Mobility   Outcome Measure   Help needed turning from your back to your side while in a flat bed without using bedrails?: A Little Help needed moving from lying on your back to sitting on the side of a flat bed without using bedrails?: A Lot Help needed moving to and from a bed to a chair (including a wheelchair)?: A Little Help needed standing up from a chair using your arms (e.g., wheelchair or bedside chair)?: A Little Help needed to walk in hospital room?: A Little Help needed climbing 3-5 steps with a railing? : A Lot 6 Click Score: 16    End of Session Equipment Utilized During Treatment: Oxygen Activity Tolerance: Patient tolerated treatment well Patient left: in chair;with call bell/phone within reach;with chair alarm set Nurse Communication: Mobility status PT Visit Diagnosis: Unsteadiness on feet (R26.81)     Time: 1023-1100 PT Time Calculation (min) (ACUTE ONLY): 37 min  Charges:  $Gait Training: 8-22 mins $Therapeutic Activity: 8-22 mins                     Earney Navy, PTA Acute Rehabilitation Services Pager: [phone] Office: [phone]     Darliss Cheney 04/23/2019, 11:15 AM

## 2019-04-23 NOTE — Progress Notes (Addendum)
      [address]       [place] [address]             [phone]      10 Days Post-Op Procedure(s) (LRB): MITRAL VALVE REPAIR (MVR) using Memo 4D 34 MM Mitral Valve. (N/A) TRANSESOPHAGEAL ECHOCARDIOGRAM (TEE) (N/A) Clipping Of Atrial Appendage using AtriCure 45 MM AtriClip. (N/A) Subjective: Sleepy this morning. Per report hallucinations last night.  Objective: Vital signs in last 24 hours: Temp:  [97.6 F (36.4 C)-98.1 F (36.7 C)] 98.1 F (36.7 C) (03/15 0529) Pulse Rate:  [54-95] 95 (03/15 0000) Cardiac Rhythm: Normal sinus rhythm;Sinus tachycardia (03/14 2000) Resp:  [14-29] 23 (03/15 0528) BP: (98-134)/(59-84) 98/62 (03/15 0000) SpO2:  [92 %-100 %] 92 % (03/15 0529) Weight:  [97.4 kg] 97.4 kg (03/15 0529)  Hemodynamic parameters for last 24 hours: CVP:  [7 mmHg-16 mmHg] 10 mmHg  Intake/Output from previous day: 03/14 0701 - 03/15 0700 In: 360 [P.O.:360] Out: -  Intake/Output this shift: No intake/output data recorded.  General appearance: cooperative and no distress Heart: regular rate and rhythm, S1, S2 normal, no murmur, click, rub or gallop Lungs: clear to auscultation bilaterally Abdomen: soft, non-tender; bowel sounds normal; no masses,  no organomegaly Extremities: 1+ pitting edema Wound: clean and dry  Lab Results: Recent Labs    04/21/19 0504  WBC 15.5*  HGB 9.1*  HCT 29.1*  PLT 199   BMET:  Recent Labs    04/21/19 0504 04/22/19 1046  NA 134* 131*  K 4.0 3.8  CL 85* 81*  CO2 37* 36*  GLUCOSE 112* 123*  BUN 49* 53*  CREATININE 1.41* 1.62*  CALCIUM 9.4 9.6    PT/INR:  Recent Labs    04/22/19 0627  LABPROT 15.2  INR 1.2   ABG    Component Value Date/Time   PHART 7.482 (H) 04/20/2019 0352   HCO3 41.3 (H) 04/20/2019 0352   TCO2 43 (H) 04/20/2019 0352   O2SAT 93.0 04/20/2019 0352   CBG (last 3)  Recent Labs    04/22/19 1654 04/22/19 2124 04/23/19 0606  GLUCAP 120* 106* 99    Assessment/Plan: S/P  Procedure(s) (LRB): MITRAL VALVE REPAIR (MVR) using Memo 4D 34 MM Mitral Valve. (N/A) TRANSESOPHAGEAL ECHOCARDIOGRAM (TEE) (N/A) Clipping Of Atrial Appendage using AtriCure 45 MM AtriClip. (N/A)  1.NSR in the 90s, brady overnight. BP well controlled.  Continue Amio, asa, statin, and low-dose BB.  2. Pulm-2L [place] with good oxygen support. Continue to wean as able. Encouraged incentive spirometer 3. Renal-creatinine 1.62, on oral lasix today. Below dry weight 4. H and H  9.1/29.1, expected acute blood loss anemia 5. Endo-blood glucose well controlled 6. INR 1.2, continue coumadin 2.5mg  7. Post-op delirium-on zyprexa. Sleepy this morning since she has not been sleeping at night. Hopefully this will improve since she is now on stepdown.    Plan: continue to wean oxygen as tolerated. Ambulate in the halls. HH with PT recommended.    LOS: 13 days    [name] 04/23/2019   I have seen and examined the patient and agree with the assessment and plan as outlined.  Hyponatremia and hypovolemia may be contributing to altered mental status and hallucinations.  Hold diuretics and consider gentle hydration.  BMET panel not done this morning for unclear reasons.  Rexene Alberts, MD 04/23/2019 8:44 AM

## 2019-04-23 NOTE — Progress Notes (Signed)
CARDIAC REHAB PHASE I   PRE:  Rate/Rhythm: 86 SR  BP:  Supine:   Sitting: 95/84  Standing:    SaO2: 100% 2L   MODE:  Ambulation: 80 ft   POST:  Rate/Rhythm: 98 SR  BP:  Supine:   Sitting: 132/94  Standing:    SaO2: 87-88%RA  100% 2L 1310-1342 Pt walked 80 ft on RA with rolling walker, gait belt use and asst x 2. Pt encouraged to stand upright and adhere to sternal precautions. To recliner after walk. Pt oriented to person,place and surgery. Stated she had some bad dreams last night. Pt used IS and she demonstrated 534ml. Chair alarm on.   Graylon Good, RN BSN  04/23/2019 1:37 PM

## 2019-04-23 NOTE — Progress Notes (Signed)
Transitions of Care Pharmacist Note  Wardine Gathers is a 69 y.o. female that has been diagnosed with MV repair and will be prescribed Coumadin (warfarin)  at discharge.   Patient Education: I provided the following education on warfarin 3/15 to the patient: How to take the medication Described what the medication is Signs of bleeding Signs/symptoms of VTE and stroke  Answered their questions Dietary and medication precautions with warfarin   Discharge Medications Plan: The patient wants to have their discharge medications filled by the Transitions of Care pharmacy rather than their usual pharmacy.  The discharge orders pharmacy has been changed to the Transitions of Care pharmacy, the patient will receive a phone call regarding co-pay, and their medications will be delivered by the Transitions of Care pharmacy.    Thank you,   Sherren Kerns, PharmD PGY1 Acute Care Pharmacy Resident April 23, 2019

## 2019-04-23 NOTE — Progress Notes (Signed)
Patient extremely agitated attempting to climb out of bed, hallucinating about snakes and dead dogs in the bed, talking to imaginary people in room. Attempted to reorient patient without success. Paged MD received orders. Will monitor closely

## 2019-04-24 ENCOUNTER — Inpatient Hospital Stay (HOSPITAL_COMMUNITY): Payer: Medicare Other

## 2019-04-24 ENCOUNTER — Ambulatory Visit: Payer: Medicare Other | Admitting: Cardiology

## 2019-04-24 LAB — GLUCOSE, CAPILLARY
Glucose-Capillary: 98 mg/dL (ref 70–99)
Glucose-Capillary: 184 mg/dL — ABNORMAL HIGH (ref 70–99)
Glucose-Capillary: 142 mg/dL — ABNORMAL HIGH (ref 70–99)
Glucose-Capillary: 103 mg/dL — ABNORMAL HIGH (ref 70–99)

## 2019-04-24 LAB — CBC
HCT: 26.7 % — ABNORMAL LOW (ref 36.0–46.0)
Hemoglobin: 8.3 g/dL — ABNORMAL LOW (ref 12.0–15.0)
MCH: 28.7 pg (ref 26.0–34.0)
MCHC: 31.1 g/dL (ref 30.0–36.0)
MCV: 92.4 fL (ref 80.0–100.0)
Platelets: 199 10*3/uL (ref 150–400)
RBC: 2.89 MIL/uL — ABNORMAL LOW (ref 3.87–5.11)
RDW: 13.8 % (ref 11.5–15.5)
WBC: 16.2 10*3/uL — ABNORMAL HIGH (ref 4.0–10.5)
nRBC: 0 % (ref 0.0–0.2)

## 2019-04-24 LAB — MAGNESIUM: Magnesium: 2.1 mg/dL (ref 1.7–2.4)

## 2019-04-24 LAB — COMPREHENSIVE METABOLIC PANEL
ALT: 49 U/L — ABNORMAL HIGH (ref 0–44)
AST: 39 U/L (ref 15–41)
Albumin: 2.8 g/dL — ABNORMAL LOW (ref 3.5–5.0)
Alkaline Phosphatase: 97 U/L (ref 38–126)
Anion gap: 10 (ref 5–15)
BUN: 41 mg/dL — ABNORMAL HIGH (ref 8–23)
CO2: 36 mmol/L — ABNORMAL HIGH (ref 22–32)
Calcium: 8.8 mg/dL — ABNORMAL LOW (ref 8.9–10.3)
Chloride: 90 mmol/L — ABNORMAL LOW (ref 98–111)
Creatinine, Ser: 1.49 mg/dL — ABNORMAL HIGH (ref 0.44–1.00)
GFR calc Af Amer: 41 mL/min — ABNORMAL LOW (ref >60)
GFR calc non Af Amer: 36 mL/min — ABNORMAL LOW (ref >60)
Glucose, Bld: 109 mg/dL — ABNORMAL HIGH (ref 70–99)
Potassium: 3.7 mmol/L (ref 3.5–5.1)
Sodium: 136 mmol/L (ref 135–145)
Total Bilirubin: 1.3 mg/dL — ABNORMAL HIGH (ref 0.3–1.2)
Total Protein: 5.8 g/dL — ABNORMAL LOW (ref 6.5–8.1)

## 2019-04-24 LAB — PREALBUMIN: Prealbumin: 15 mg/dL — ABNORMAL LOW (ref 18–38)

## 2019-04-24 LAB — PROTIME-INR
INR: 1.5 — ABNORMAL HIGH (ref 0.8–1.2)
Prothrombin Time: 17.7 seconds — ABNORMAL HIGH (ref 11.4–15.2)

## 2019-04-24 LAB — BRAIN NATRIURETIC PEPTIDE: B Natriuretic Peptide: 128.8 pg/mL — ABNORMAL HIGH (ref 0.0–100.0)

## 2019-04-24 MED ORDER — FUROSEMIDE 20 MG PO TABS
20.0000 mg | ORAL_TABLET | Freq: Every day | ORAL | Status: DC
  Administered 2019-04-24 – 2019-04-26 (×3): 20 mg via ORAL
  Filled 2019-04-24 (×3): qty 1

## 2019-04-24 NOTE — Progress Notes (Signed)
CARDIAC REHAB PHASE I   PRE:  Rate/Rhythm: 86 SR  BP:  Supine:   Sitting: 117/69  Standing:    SaO2: 100% 2L  MODE:  Ambulation: 300 ft   POST:  Rate/Rhythm: 98 SR  BP:  Supine:   Sitting: 108/65  Standing:    SaO2: 87% RA  98% 2L 1050-1130 Pt received bath prior to walk. Pt walked 300 ft on 2L with gait belt and rolling walker. Started out on RA but pt desat to 87%. Put on 2L and sats stayed above 90%. Stopped once to rest. To BSC after walk and then to recliner.. Encouraged IS. Call bell on recliner and alarm on. Pt oriented. Needs assistance to stand from bed.   Graylon Good, RN BSN  04/24/2019 11:27 AM

## 2019-04-24 NOTE — Progress Notes (Addendum)
Advanced Heart Failure Rounding Note  PCP-Cardiologist: No primary care provider on file.   Subjective:    S/p complex MV repair on 3/5   Yesterday diuretics held. Creatinine trending down 1.6>1.4   Denies SOB. Mentation improved     Objective:   Weight Range: 97.4 kg Body mass index is 32.65 kg/m.   Vital Signs:   Temp:  [97.5 F (36.4 C)-98.4 F (36.9 C)] 98.3 F (36.8 C) (03/16 0737) Pulse Rate:  [78-99] 82 (03/16 0941) Resp:  [16-22] 21 (03/16 0941) BP: (92-124)/(52-69) 117/69 (03/16 0941) SpO2:  [92 %-100 %] 98 % (03/16 0941) Last BM Date: (unknown )  Weight change: Filed Weights   04/21/19 0500 04/22/19 0426 04/23/19 0529  Weight: 96.5 kg 96.2 kg 97.4 kg    Intake/Output:   Intake/Output Summary (Last 24 hours) at 04/24/2019 1116 Last data filed at 04/24/2019 1054 Gross per 24 hour  Intake 360 ml  Output 1350 ml  Net -990 ml      Physical Exam  General:  Sitting in the chair. No resp difficulty HEENT: normal  Anicteric  Neck: supple. JVP 5-6 . Carotids 2+ bilat; no bruits. No lymphadenopathy or thryomegaly appreciated. Cor: PMI nondisplaced. Regular rate & rhythm. No rubs, gallops or murmurs. Sternal incision  Lungs: clear no wheeze Abdomen: soft, nontender, nondistended. No hepatosplenomegaly. No bruits or masses. Good bowel sounds. Extremities: no cyanosis, clubbing, rash, edema Neuro: alert & oriented x 3, cranial nerves grossly intact. moves all 4 extremities w/o difficulty. Affect pleasant    Telemetry   NSR 80s-90s Personally reviewed  Labs    CBC Recent Labs    04/24/19 0730  WBC 16.2*  HGB 8.3*  HCT 26.7*  MCV 92.4  PLT 123XX123   Basic Metabolic Panel Recent Labs    04/23/19 1429 04/24/19 0730  NA 135 136  K 4.0 3.7  CL 85* 90*  CO2 37* 36*  GLUCOSE 80 109*  BUN 50* 41*  CREATININE 1.67* 1.49*  CALCIUM 9.4 8.8*  MG  --  2.1   Liver Function Tests Recent Labs    04/24/19 0730  AST 39  ALT 49*  ALKPHOS 97    BILITOT 1.3*  PROT 5.8*  ALBUMIN 2.8*   No results for input(s): LIPASE, AMYLASE in the last 72 hours. Cardiac Enzymes No results for input(s): CKTOTAL, CKMB, CKMBINDEX, TROPONINI in the last 72 hours.  BNP: BNP (last 3 results) Recent Labs    04/24/19 0730  BNP 128.8*    ProBNP (last 3 results) No results for input(s): PROBNP in the last 8760 hours.   D-Dimer No results for input(s): DDIMER in the last 72 hours. Hemoglobin A1C No results for input(s): HGBA1C in the last 72 hours. Fasting Lipid Panel No results for input(s): CHOL, HDL, LDLCALC, TRIG, CHOLHDL, LDLDIRECT in the last 72 hours. Thyroid Function Tests No results for input(s): TSH, T4TOTAL, T3FREE, THYROIDAB in the last 72 hours.  Invalid input(s): FREET3  Other results:   Imaging    DG Chest 2 View  Result Date: 04/24/2019 CLINICAL DATA:  CHF EXAM: CHEST - 2 VIEW COMPARISON:  04/22/2019 FINDINGS: Cardiomegaly. Airspace disease in the lower lobes slightly improved since prior study. No overt edema. No visible effusions or pneumothorax. No acute bony abnormality. IMPRESSION: Bibasilar atelectasis or infiltrates, slightly improved since prior study. Stable cardiomegaly. Electronically Signed   By: Rolm Baptise M.D.   On: 04/24/2019 09:31     Medications:     Scheduled Medications: .  amiodarone  200 mg Oral BID  . aspirin EC  81 mg Oral QHS  . atorvastatin  40 mg Oral QHS  . budesonide (PULMICORT) nebulizer solution  0.5 mg Nebulization BID  . chlorhexidine  15 mL Mouth Rinse BID  . Chlorhexidine Gluconate Cloth  6 each Topical Daily  . [facility], Patient & Family Education   Does not apply Once  . docusate sodium  200 mg Oral Daily  . feeding supplement  1 Container Oral TID BM  . FLUoxetine  20 mg Oral q1800  . insulin aspart  0-20 Units Subcutaneous TID WC  . insulin aspart  0-5 Units Subcutaneous QHS  . levothyroxine  125 mcg Oral QAC breakfast  . liothyronine  5 mcg Oral  Daily  . mouth rinse  15 mL Mouth Rinse q12n4p  . metoprolol tartrate  12.5 mg Oral BID  . OLANZapine zydis  5 mg Oral Daily  . pantoprazole  40 mg Oral Daily  . polyethylene glycol  17 g Oral BID  . senna-docusate  1 tablet Oral BID  . warfarin  2.5 mg Oral q1800  . Warfarin - Physician Dosing Inpatient   Does not apply q1800    Infusions: . sodium chloride Stopped (04/20/19 1229)    PRN Medications: sodium chloride, acetaminophen, bisacodyl, Melatonin, metoprolol tartrate, ondansetron (ZOFRAN) IV, sodium chloride flush   Assessment/Plan    1. Mitral Regurgitation, severe - ECHO/TEE with severe MR P2 prolapse - s/p complex MV repair 3/5  2. A/C Diastolic Heart Failure/pulmonary HTN - ECHO 03/2019 EF 60-65% grade II DD  - s/p MVR.  - Volume status stable. Off diuretics.  -Tomorrow start lasix 20 mg daily.   3. Acute postop hypoxic respiratory failure -Continue incentive spirometry. -Refuses nighttime BiPAP.  4. Acute blood loss anemia -Hemoglobin 8.3.   5. H/o lupus and Sjogren's disease - followed at [facility] - her neck/ jaw pain in the area of the Stensen's duct may possibly be related to Sjogren's syndrome and salivary gland involvement.  - CT unremarkable.  - Completed 3 days of cefepime.   6.  Hypokalemia/hypomagnesemia. -Stable today - Off spiro with elevated creatinine.   7. PAF - She is status post LAA clipping  - converted to sinus rhythm on 3/13 - Maintaining NSR. Continue  amiodarone, 200 bid.  - Started on coumadin 04/19/19.  - INR 1.5  Dosing per TCTS  8. Post-op delirium -Resolved.  - continue Zyprexa   9. AKI:  - baseline SCr <1.0 - Creatinine trending down 1.4>1.67>1.49  - BMET in am. Off diuretics but will start low dose lasix in am.       Length of Stay: [place], NP  04/24/2019, 11:16 AM  Advanced Heart Failure Team Pager [phone] (M-F; 7a - 4p)  Please contact [facility] for night-coverage after hours (4p  -7a ) and weekends on [email or web address]   Patient seen and examined with the above-signed Advanced Practice Provider and/or Housestaff. I personally reviewed laboratory data, imaging studies and relevant notes. I independently examined the patient and formulated the important aspects of the plan. I have edited the note to reflect any of my changes or salient points. I have personally discussed the plan with the patient and/or family.  Much better today. Hallucinations have resolved on Zyprexa. Volume status improved. Currently off lasix. Renal function improved. Maintaining NSR on amio.   Continue to ambulate/mobilize. May need short SNF stay. Will see what PT suggests.   Warfarin  dosing per TCTS  Glori Bickers, MD  4:08 PM

## 2019-04-24 NOTE — Plan of Care (Signed)
Continue to monitor

## 2019-04-24 NOTE — Progress Notes (Addendum)
      [address]       [place] [address]             [phone]      11 Days Post-Op Procedure(s) (LRB): MITRAL VALVE REPAIR (MVR) using Memo 4D 34 MM Mitral Valve. (N/A) TRANSESOPHAGEAL ECHOCARDIOGRAM (TEE) (N/A) Clipping Of Atrial Appendage using AtriCure 45 MM AtriClip. (N/A) Subjective: Feels much better and more clear this morning. Hallucinations have gone away. Lab drawing blood at the bedside.   Objective: Vital signs in last 24 hours: Temp:  [97.5 F (36.4 C)-98.4 F (36.9 C)] 98.4 F (36.9 C) (03/15 2254) Pulse Rate:  [77-99] 78 (03/16 0400) Cardiac Rhythm: Normal sinus rhythm (03/15 2009) Resp:  [14-21] 17 (03/16 0400) BP: (92-117)/(52-67) 112/67 (03/16 0400) SpO2:  [92 %-100 %] 96 % (03/16 0400)  Hemodynamic parameters for last 24 hours: CVP:  [10 mmHg-12 mmHg] 12 mmHg  Intake/Output from previous day: 03/15 0701 - 03/16 0700 In: 600 [P.O.:600] Out: 1350 [Urine:1350] Intake/Output this shift: No intake/output data recorded.  General appearance: alert, cooperative and no distress Heart: regular rate and rhythm, S1, S2 normal, no murmur, click, rub or gallop Lungs: clear to auscultation bilaterally Abdomen: soft, non-tender; bowel sounds normal; no masses,  no organomegaly Extremities: extremities normal, atraumatic, no cyanosis or edema Wound: clean and dry  Lab Results: No results for input(s): WBC, HGB, HCT, PLT in the last 72 hours. BMET:  Recent Labs    04/22/19 1046 04/23/19 1429  NA 131* 135  K 3.8 4.0  CL 81* 85*  CO2 36* 37*  GLUCOSE 123* 80  BUN 53* 50*  CREATININE 1.62* 1.67*  CALCIUM 9.6 9.4    PT/INR:  Recent Labs    04/23/19 0657  LABPROT 16.6*  INR 1.4*   ABG    Component Value Date/Time   PHART 7.482 (H) 04/20/2019 0352   HCO3 41.3 (H) 04/20/2019 0352   TCO2 43 (H) 04/20/2019 0352   O2SAT 93.0 04/20/2019 0352   CBG (last 3)  Recent Labs    04/23/19 1230 04/23/19 1700 04/23/19 2129  GLUCAP  143* 78 140*    Assessment/Plan: S/P Procedure(s) (LRB): MITRAL VALVE REPAIR (MVR) using Memo 4D 34 MM Mitral Valve. (N/A) TRANSESOPHAGEAL ECHOCARDIOGRAM (TEE) (N/A) Clipping Of Atrial Appendage using AtriCure 45 MM AtriClip. (N/A)  1.NSR in the 80s. BP well controlled.  Continue Amio, asa, statin, and low-dose BB.  2. Pulm-2L [place] with good oxygen support. Continue to wean as able. Encouraged incentive spirometer 3. Renal-creatinine 1.67, lasix stopped 4. H and H  9.1/29.1, expected acute blood loss anemia 5. Endo-blood glucose well controlled 6. INR 1.4, continue coumadin 2.5mg  7. Post-op delirium-on zyprexa. Mentation more clear this morning. Actually slept last night.   Plan: await labs, weaning oxygen. Mentation much clearer this morning. Will likely need a facility at discharge-she needs assistance to the bedside commode.    LOS: 14 days    [name] 04/24/2019  I have seen and examined the patient and agree with the assessment and plan as outlined.  Rexene Alberts, MD 04/24/2019 8:37 AM

## 2019-04-25 LAB — BASIC METABOLIC PANEL
Anion gap: 13 (ref 5–15)
BUN: 37 mg/dL — ABNORMAL HIGH (ref 8–23)
CO2: 35 mmol/L — ABNORMAL HIGH (ref 22–32)
Calcium: 8.5 mg/dL — ABNORMAL LOW (ref 8.9–10.3)
Chloride: 90 mmol/L — ABNORMAL LOW (ref 98–111)
Creatinine, Ser: 1.34 mg/dL — ABNORMAL HIGH (ref 0.44–1.00)
GFR calc Af Amer: 47 mL/min — ABNORMAL LOW (ref >60)
GFR calc non Af Amer: 41 mL/min — ABNORMAL LOW (ref >60)
Glucose, Bld: 86 mg/dL (ref 70–99)
Potassium: 3.5 mmol/L (ref 3.5–5.1)
Sodium: 138 mmol/L (ref 135–145)

## 2019-04-25 LAB — GLUCOSE, CAPILLARY
Glucose-Capillary: 89 mg/dL (ref 70–99)
Glucose-Capillary: 142 mg/dL — ABNORMAL HIGH (ref 70–99)
Glucose-Capillary: 98 mg/dL (ref 70–99)
Glucose-Capillary: 117 mg/dL — ABNORMAL HIGH (ref 70–99)

## 2019-04-25 LAB — PROTIME-INR
INR: 1.6 — ABNORMAL HIGH (ref 0.8–1.2)
Prothrombin Time: 18.7 seconds — ABNORMAL HIGH (ref 11.4–15.2)

## 2019-04-25 MED ORDER — POTASSIUM CHLORIDE CRYS ER 20 MEQ PO TBCR
40.0000 meq | EXTENDED_RELEASE_TABLET | Freq: Two times a day (BID) | ORAL | Status: AC
  Administered 2019-04-25 (×2): 40 meq via ORAL
  Filled 2019-04-25 (×2): qty 2

## 2019-04-25 MED ORDER — WARFARIN SODIUM 4 MG PO TABS
4.0000 mg | ORAL_TABLET | Freq: Every day | ORAL | Status: DC
  Administered 2019-04-25: 4 mg via ORAL
  Filled 2019-04-25: qty 1

## 2019-04-25 MED ORDER — GLUCERNA SHAKE PO LIQD: 237.0000 mL | Freq: Two times a day (BID) | ORAL | Status: DC

## 2019-04-25 MED ORDER — SPIRONOLACTONE 12.5 MG HALF TABLET
12.5000 mg | ORAL_TABLET | Freq: Every day | ORAL | Status: DC
  Administered 2019-04-25 – 2019-04-26 (×2): 12.5 mg via ORAL
  Filled 2019-04-25 (×2): qty 1

## 2019-04-25 MED ORDER — POTASSIUM CHLORIDE CRYS ER 20 MEQ PO TBCR: 40.0000 meq | EXTENDED_RELEASE_TABLET | Freq: Once | ORAL | Status: DC

## 2019-04-25 MED ORDER — POTASSIUM CHLORIDE CRYS ER 20 MEQ PO TBCR
20.0000 meq | EXTENDED_RELEASE_TABLET | Freq: Every day | ORAL | Status: DC
  Filled 2019-04-25: qty 1

## 2019-04-25 NOTE — Progress Notes (Signed)
Nutrition Follow up   DOCUMENTATION CODES:   Not applicable  INTERVENTION:     Glucerna Shake po BID, each supplement provides 220 kcal and 10 grams of protein   NUTRITION DIAGNOSIS:   Increased nutrient needs related to post-op healing as evidenced by estimated needs.  Ongoing  GOAL:   Patient will meet greater than or equal to 90% of their needs   Progressing   MONITOR:   Diet advancement, Vent status, Skin, TF tolerance, Weight trends, Labs, I & O's  REASON FOR ASSESSMENT:   Consult Enteral/tube feeding initiation and management  ASSESSMENT:   Patient with PMH significant for severe mitral valve prolapse, CHF, pulmonary HTN, antiphospholipid syndrome, COPD, and OSA. Presents this admission for surgery.   3/5- s/p MVR 3/6- failed extubation, re-intubated  3/10- extubated   Pt reports having a great appetite. Meal completions charted as 75-100% since admit. Complains of not having BM in 3 days. Had suppository today, will monitor for results.    Admission weight: 102.3 kg  Current weight: 98.3 kg (stable over the last week)  I/O: -11,415 ml since 3/3  UOP: 1,000 ml x 24 hrs   Medications: colace, 20 mg lasix daily, SS novolog, miralax, 20 mEq daily, senokot Labs: CBG 89-184   Diet Order:   Diet Order            Diet heart healthy/carb modified Room service appropriate? Yes; Fluid consistency: Thin  Diet effective now              EDUCATION NEEDS:   Not appropriate for education at this time  Skin:  Skin Assessment: Skin Integrity Issues: Skin Integrity Issues:: Incisions Incisions: sternum  Last BM:  3/13  Height:   Ht Readings from Last 1 Encounters:  04/11/19 5\' 8"  (1.727 m)    Weight:   Wt Readings from Last 1 Encounters:  04/25/19 98.3 kg    Ideal Body Weight:  63.6 kg  BMI:  Body mass index is 32.95 kg/m.  Estimated Nutritional Needs:   Kcal:  1800-2000 kcal  Protein:  90-105 grams  Fluid:  >/= 1.8 L/day  Mariana Single RD, LDN Clinical Nutrition Pager listed in [place]

## 2019-04-25 NOTE — Plan of Care (Signed)
Continue to monitor

## 2019-04-25 NOTE — Progress Notes (Addendum)
      Rebecca Carr       [place] [address]             [phone]      12 Days Post-Op Procedure(s) (LRB): MITRAL VALVE REPAIR (MVR) using Memo 4D 34 MM Mitral Valve. (N/A) TRANSESOPHAGEAL ECHOCARDIOGRAM (TEE) (N/A) Clipping Of Atrial Appendage using AtriCure 45 MM AtriClip. (N/A) Subjective: Sleepy this morning but asking for coffee. Set up breakfast tray for patient.   Objective: Vital signs in last 24 hours: Temp:  [97.8 F (36.6 C)-98.8 F (37.1 C)] 97.8 F (36.6 C) (03/17 0359) Pulse Rate:  [80-97] 84 (03/17 0400) Cardiac Rhythm: Normal sinus rhythm (03/17 0359) Resp:  [17-24] 17 (03/17 0400) BP: (100-124)/(55-69) 105/60 (03/17 0400) SpO2:  [97 %-100 %] 97 % (03/17 0400) Weight:  [98.3 kg] 98.3 kg (03/17 0127)     Intake/Output from previous day: 03/16 0701 - 03/17 0700 In: 490 [P.O.:490] Out: 1000 [Urine:1000] Intake/Output this shift: No intake/output data recorded.  General appearance: alert, cooperative and no distress Heart: regular rate and rhythm, S1, S2 normal, no murmur, click, rub or gallop Lungs: clear to auscultation bilaterally Abdomen: soft, non-tender; bowel sounds normal; no masses,  no organomegaly Extremities: extremities normal, atraumatic, no cyanosis or edema Wound: clean and dry  Lab Results: Recent Labs    04/24/19 0730  WBC 16.2*  HGB 8.3*  HCT 26.7*  PLT 199   BMET:  Recent Labs    04/24/19 0730 04/25/19 0450  NA 136 138  K 3.7 3.5  CL 90* 90*  CO2 36* 35*  GLUCOSE 109* 86  BUN 41* 37*  CREATININE 1.49* 1.34*  CALCIUM 8.8* 8.5*    PT/INR:  Recent Labs    04/25/19 0450  LABPROT 18.7*  INR 1.6*   ABG    Component Value Date/Time   PHART 7.482 (H) 04/20/2019 0352   HCO3 41.3 (H) 04/20/2019 0352   TCO2 43 (H) 04/20/2019 0352   O2SAT 93.0 04/20/2019 0352   CBG (last 3)  Recent Labs    04/24/19 1642 04/24/19 2101 04/25/19 0608  GLUCAP 142* 103* 89    Assessment/Plan: S/P Procedure(s)  (LRB): MITRAL VALVE REPAIR (MVR) using Memo 4D 34 MM Mitral Valve. (N/A) TRANSESOPHAGEAL ECHOCARDIOGRAM (TEE) (N/A) Clipping Of Atrial Appendage using AtriCure 45 MM AtriClip. (N/A)  1.NSR in the 80s. BP well controlled. Continue Amio, asa, statin, and low-dose BB.  2. Pulm-2L [place] with good oxygen support. Continue to wean as able. Encouraged incentive spirometer. Desats with ambulation. If she goes home will likely need home oxygen.  3. Renal-creatinine 1.34, lasix stopped 4. H and H 8.3/26.7, dropped from a few days ago, expected acute blood loss anemia 5. Endo-blood glucose well controlled 6. INR 1.6, continue coumadin 4mg  7. Post-op delirium-on zyprexa. Resolved.  Plan: HH with all resources vs. SNF. Walks 300 ft on 2L of oxygen with a stop to rest. Needs assistance in and out of bed. Would favor SNF at this time but that could certainly change. Weaning oxygen as tolerated.    LOS: 15 days    Rebecca Carr 04/25/2019    I have seen and examined the patient and agree with the assessment and plan as outlined.  Rexene Alberts, MD 04/25/2019 8:10 AM

## 2019-04-25 NOTE — Progress Notes (Signed)
[id number] Came to see pt to walk. Assisted RN to get pt from bathroom to bed as pt stated she feels impacted. Encouraged her to walk later when feeling better.  Will  return if time permits. Sats 90-95%RA. Encouraged IS. Graylon Good RN BSN 04/25/2019 1:33 PM

## 2019-04-25 NOTE — Progress Notes (Signed)
Advanced Heart Failure Rounding Note  PCP-Cardiologist: No primary care provider on file.   Subjective:    S/p complex MV repair on 3/5   Much improved today. Remains in NSR. Walking with walker. Denies SOB,orthopnea or PND. Delirium resolved.   INR 1.6    Objective:   Weight Range: 98.3 kg Body mass index is 32.95 kg/m.   Vital Signs:   Temp:  [97.8 F (36.6 C)-98.3 F (36.8 C)] 98.3 F (36.8 C) (03/17 0823) Pulse Rate:  [84-97] 92 (03/17 1400) Resp:  [17-32] 26 (03/17 1600) BP: (100-113)/(50-63) 113/57 (03/17 1100) SpO2:  [90 %-99 %] 90 % (03/17 1400) Weight:  [98.3 kg] 98.3 kg (03/17 0127) Last BM Date: (Stated a week ago, but recorded 04/21/2019?)  Weight change: Filed Weights   04/22/19 0426 04/23/19 0529 04/25/19 0127  Weight: 96.2 kg 97.4 kg 98.3 kg    Intake/Output:   Intake/Output Summary (Last 24 hours) at 04/25/2019 1852 Last data filed at 04/25/2019 0128 Gross per 24 hour  Intake 250 ml  Output 400 ml  Net -150 ml      Physical Exam  General:  Sitting in the chair. No resp difficulty General:  Well appearing. No resp difficulty HEENT: normal Neck: supple. no JVD. Carotids 2+ bilat; no bruits. No lymphadenopathy or thryomegaly appreciated. Cor: PMI nondisplaced. Regular rate & rhythm. No rubs, gallops or murmurs. Lungs: clear Abdomen: soft, nontender, nondistended. No hepatosplenomegaly. No bruits or masses. Good bowel sounds. Extremities: no cyanosis, clubbing, rash, edema Neuro: alert & orientedx3, cranial nerves grossly intact. moves all 4 extremities w/o difficulty. Affect pleasant     Telemetry   NSR 90s Personally reviewed  Labs    CBC Recent Labs    04/24/19 0730  WBC 16.2*  HGB 8.3*  HCT 26.7*  MCV 92.4  PLT 123XX123   Basic Metabolic Panel Recent Labs    04/24/19 0730 04/25/19 0450  NA 136 138  K 3.7 3.5  CL 90* 90*  CO2 36* 35*  GLUCOSE 109* 86  BUN 41* 37*  CREATININE 1.49* 1.34*  CALCIUM 8.8* 8.5*  MG 2.1   --    Liver Function Tests Recent Labs    04/24/19 0730  AST 39  ALT 49*  ALKPHOS 97  BILITOT 1.3*  PROT 5.8*  ALBUMIN 2.8*   No results for input(s): LIPASE, AMYLASE in the last 72 hours. Cardiac Enzymes No results for input(s): CKTOTAL, CKMB, CKMBINDEX, TROPONINI in the last 72 hours.  BNP: BNP (last 3 results) Recent Labs    04/24/19 0730  BNP 128.8*    ProBNP (last 3 results) No results for input(s): PROBNP in the last 8760 hours.   D-Dimer No results for input(s): DDIMER in the last 72 hours. Hemoglobin A1C No results for input(s): HGBA1C in the last 72 hours. Fasting Lipid Panel No results for input(s): CHOL, HDL, LDLCALC, TRIG, CHOLHDL, LDLDIRECT in the last 72 hours. Thyroid Function Tests No results for input(s): TSH, T4TOTAL, T3FREE, THYROIDAB in the last 72 hours.  Invalid input(s): FREET3  Other results:   Imaging    No results found.   Medications:     Scheduled Medications: . amiodarone  200 mg Oral BID  . aspirin EC  81 mg Oral QHS  . atorvastatin  40 mg Oral QHS  . budesonide (PULMICORT) nebulizer solution  0.5 mg Nebulization BID  . chlorhexidine  15 mL Mouth Rinse BID  . Chlorhexidine Gluconate Cloth  6 each Topical Daily  . [facility], Patient & Family Education   Does not apply Once  . docusate sodium  200 mg Oral Daily  . [START ON 04/26/2019] feeding supplement (GLUCERNA SHAKE)  237 mL Oral BID BM  . FLUoxetine  20 mg Oral q1800  . furosemide  20 mg Oral Daily  . insulin aspart  0-20 Units Subcutaneous TID WC  . insulin aspart  0-5 Units Subcutaneous QHS  . levothyroxine  125 mcg Oral QAC breakfast  . liothyronine  5 mcg Oral Daily  . mouth rinse  15 mL Mouth Rinse q12n4p  . metoprolol tartrate  12.5 mg Oral BID  . OLANZapine zydis  5 mg Oral Daily  . pantoprazole  40 mg Oral Daily  . polyethylene glycol  17 g Oral BID  . [START ON 04/26/2019] potassium chloride  20 mEq Oral Daily  . potassium chloride  40  mEq Oral BID  . senna-docusate  1 tablet Oral BID  . warfarin  4 mg Oral q1800  . Warfarin - Physician Dosing Inpatient   Does not apply q1800    Infusions: . sodium chloride Stopped (04/20/19 1229)    PRN Medications: sodium chloride, acetaminophen, bisacodyl, Melatonin, metoprolol tartrate, ondansetron (ZOFRAN) IV, sodium chloride flush   Assessment/Plan    1. Mitral Regurgitation, severe - ECHO/TEE with severe MR P2 prolapse - s/p complex MV repair 3/5  2. A/C Diastolic Heart Failure/pulmonary HTN - ECHO 03/2019 EF 60-65% grade II DD  - s/p MVR.  - Volume status ok. Weight up slightly - Continue lasix 20 daily. May need to increase to 40 daily   3. Acute postop hypoxic respiratory failure -Continue incentive spirometry. -Refuses nighttime BiPAP.  4. Acute blood loss anemia -Hemoglobin 8.3.   5. H/o lupus and Sjogren's disease - followed at [facility] - her neck/ jaw pain in the area of the Stensen's duct may possibly be related to Sjogren's syndrome and salivary gland involvement.  - CT unremarkable.  - Completed 3 days of cefepime.   6.  Hypokalemia/hypomagnesemia. - K 3.5  - start spiro 12.5 daily   7. PAF - She is status post LAA clipping  - converted to sinus rhythm on 3/13 - Maintaining NSR. Continue amioo 200 bid - Started on coumadin 04/19/19.  - INR 1.6  Dosing per TCTS  8. Post-op delirium -Resolved.  - continue Zyprexa   9. AKI:  - baseline SCr <1.0 - Creatinine trending down 1.4>1.67>1.49 > 1.34 - Follow BMET    Length of Stay: [place], MD  04/25/2019, 6:52 PM  Advanced Heart Failure Team Pager [phone] (M-F; 7a - 4p)  Please contact [facility] for night-coverage after hours (4p -7a ) and weekends on [email or web address]

## 2019-04-25 NOTE — Progress Notes (Signed)
CARDIAC REHAB PHASE I   PRE:  Rate/Rhythm: 93 SR  BP:  Supine: 126/68  Sitting:   Standing:    SaO2: 91%RA  MODE:  Ambulation: 200 ft   POST:  Rate/Rhythm: 105 ST  BP:  Supine:   Sitting: 107/81  Standing:    SaO2: 91%RA 1420-1450 Assisted to BR for large BM. Then pt able to walk 200 ft on RA with rolling walker. To recliner with call bell.. pt still needs much assistance to stand even with rocking especially from toilet. Encouraged IS. To chair with call bell. Feels much better now.   Graylon Good, RN BSN  04/25/2019 2:45 PM

## 2019-04-25 NOTE — TOC Initial Note (Addendum)
Transition of Care [facility]) - Initial/Assessment Note    Patient Details  Name: Rebecca Carr MRN: [id number] Date of Birth: 1950-06-07  Transition of Care [facility]) CM/SW Contact:    Carles Collet, RN Phone Number: 04/25/2019, 2:26 PM  Clinical Narrative:      Damaris Schooner w patient over the phone. She states that she plans on DC to home. She has needed DME at home. She is interested in [facility] services.   Will need HH orders.       Referral accepted by [facility]        Expected Discharge Plan: [place] Barriers to Discharge: Continued Medical Work up   Patient Goals and CMS Choice Patient states their goals for this hospitalization and ongoing recovery are:: to go home CMS Medicare.gov Compare Post Acute Care list provided to:: Patient Choice offered to / list presented to : Patient  Expected Discharge Plan and Services Expected Discharge Plan: [place]                                              Prior Living Arrangements/Services                       Activities of Daily Living Home Assistive Devices/Equipment: Eyeglasses ADL Screening (condition at time of admission) Patient's cognitive ability adequate to safely complete daily activities?: Yes Is the patient deaf or have difficulty hearing?: No Does the patient have difficulty seeing, even when wearing glasses/contacts?: No Does the patient have difficulty concentrating, remembering, or making decisions?: No Patient able to express need for assistance with ADLs?: Yes Does the patient have difficulty dressing or bathing?: No Independently performs ADLs?: Yes (appropriate for developmental age) Does the patient have difficulty walking or climbing stairs?: Yes Weakness of Legs: Both Weakness of Arms/Hands: None  Permission Sought/Granted                  Emotional Assessment              Admission diagnosis:  Severe mitral regurgitation [I34.0] S/P mitral valve  repair [id number] Patient Active Problem List   Diagnosis Date Noted  . Acute respiratory failure with hypoxia ([place])   . S/P MVR (mitral valve replacement)   . S/P mitral valve repair 04/13/2019  . COPD (chronic obstructive pulmonary disease) ([place])   . Encounter for intubation   . Obstructive sleep apnea 04/03/2019  . Mitral valve prolapse 03/28/2019  . Chest pain of uncertain etiology 0000000  . Coronary artery calcification of native artery 02/27/2019  . Acute on chronic diastolic heart failure ([place]) 01/03/2017  . NSVT (nonsustained ventricular tachycardia) ([place]) 10/01/2016  . Exertional dyspnea 10/01/2016  . Bilateral leg cramps 07/30/2016  . Severe mitral regurgitation 07/30/2016  . Rapid heart rate 07/30/2016  . Palpitations 07/30/2016  . PVC's (premature ventricular contractions) 07/30/2016  . Abnormal EKG 07/30/2016  . 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 Hx of colonic polyps 08/28/2013  . GERD 03/02/2010  . DERMATITIS, CHRONIC 03/02/2010  . FLATULENCE ERUCTATION AND GAS PAIN 03/02/2010  . DIARRHEA 03/02/2010   PCP:  Aurea Graff, PA-C Pharmacy:   [facility]  [place] [address] Phone: [phone] Fax: [phone]  [id number] - [place], [address] [place] [address] Phone: [phone] Fax: [phone]  [facility] [place] [place] - [address] [place] [address] Phone: [phone] Fax: [phone]     Social Determinants of Health (SDOH) Interventions    Readmission Risk Interventions No flowsheet data found.

## 2019-04-25 NOTE — Progress Notes (Signed)
Physical Therapy Treatment Patient Details Name: Rebecca Carr MRN: [id number] DOB: 10-23-1950 Today's Date: 04/25/2019    History of Present Illness 69 year old female with chronic respiratory failure 2/2 COPD, underlying history of severe mitral valve disease and pulmonary hypertension.  Underwent mitral valve repair on 3/5, extubated on 3/6 but developed progressive somnolence and hypercarbia and was reintubated. Extubated on 3/10.    PT Comments    Pt progressing towards goals. Improved tolerance for gait this session. Required min A to stand from normal height surface and mod A to stand from lower toilet seat. Min guard to supervision for gait with RW. Current recommendations appropriate. Will continue to follow acutely to maximize functional mobility independence and safety.     Follow Up Recommendations  Home health PT;Supervision/Assistance - 24 hour     Equipment Recommendations  None recommended by PT(Reports she has RW and 3 in1 at home)    Recommendations for Other Services       Precautions / Restrictions Precautions Precautions: Fall;Sternal Precaution Booklet Issued: No Precaution Comments: sternal Restrictions Weight Bearing Restrictions: Yes(sternal )    Mobility  Bed Mobility Overal bed mobility: Needs Assistance Bed Mobility: Supine to Sit;Sit to Supine     Supine to sit: Min assist Sit to supine: Min assist   General bed mobility comments: Min a for trunk assist. Cues for log roll technique to maintain precautions. Min a for LE assist for return to supine.   Transfers Overall transfer level: Needs assistance Equipment used: Rolling walker (2 wheeled) Transfers: Sit to/from Stand Sit to Stand: Min assist;Mod assist         General transfer comment: Cues for hand placement to maintain precautions. Min A to stand from higher surface and mod A to stand from lower toilet height.   Ambulation/Gait Ambulation/Gait assistance: Min  guard;Supervision Gait Distance (Feet): 175 Feet Assistive device: Rolling walker (2 wheeled) Gait Pattern/deviations: Step-through pattern;Decreased stride length Gait velocity: decreased   General Gait Details: Improved tolerance for gait. Cues for safe use of RW. Min guard to supervision for safety.    Stairs             Wheelchair Mobility    Modified Rankin (Stroke Patients Only)       Balance Overall balance assessment: Needs assistance Sitting-balance support: No upper extremity supported;Feet supported Sitting balance-Leahy Scale: Good     Standing balance support: Bilateral upper extremity supported Standing balance-Leahy Scale: Poor Standing balance comment: Reliant on BUE support                            Cognition Arousal/Alertness: Awake/alert Behavior During Therapy: WFL for tasks assessed/performed Overall Cognitive Status: No family/caregiver present to determine baseline cognitive functioning                                 General Comments: Pt tangential throughout session. Unsure of baseline. Cues to maintain precautions.       Exercises      General Comments General comments (skin integrity, edema, etc.): Pt's oxygen sats at 92-94% on RA during gait.       Pertinent Vitals/Pain Pain Assessment: Faces Faces Pain Scale: Hurts a little bit Pain Location: chest with mobility  Pain Descriptors / Indicators: Sore Pain Intervention(s): Monitored during session;Limited activity within patient's tolerance;Repositioned    Home Living  Prior Function            PT Goals (current goals can now be found in the care plan section) Acute Rehab PT Goals Patient Stated Goal: To improve mobility and return home PT Goal Formulation: With patient Time For Goal Achievement: 05/04/19 Potential to Achieve Goals: Good Progress towards PT goals: Progressing toward goals    Frequency    Min  3X/week      PT Plan Current plan remains appropriate    Co-evaluation              AM-PAC PT "6 Clicks" Mobility   Outcome Measure  Help needed turning from your back to your side while in a flat bed without using bedrails?: A Little Help needed moving from lying on your back to sitting on the side of a flat bed without using bedrails?: A Little Help needed moving to and from a bed to a chair (including a wheelchair)?: A Little Help needed standing up from a chair using your arms (e.g., wheelchair or bedside chair)?: A Lot Help needed to walk in hospital room?: A Little Help needed climbing 3-5 steps with a railing? : A Lot 6 Click Score: 16    End of Session   Activity Tolerance: Patient tolerated treatment well Patient left: in bed;with bed alarm set;with call bell/phone within reach Nurse Communication: Mobility status PT Visit Diagnosis: Unsteadiness on feet (R26.81)     Time: [id number] PT Time Calculation (min) (ACUTE ONLY): 17 min  Charges:  $Gait Training: 8-22 mins                     Lou Miner, DPT  Acute Rehabilitation Services  Pager: [phone] Office: [phone]    Rudean Hitt 04/25/2019, 1:30 PM

## 2019-04-25 NOTE — Care Management Important Message (Signed)
Important Message  Patient Details  Name: Rebecca Carr MRN: [id number] Date of Birth: February 12, 1950   Medicare Important Message Given:  Yes     Shelda Altes 04/25/2019, 10:16 AM

## 2019-04-25 NOTE — Progress Notes (Signed)
Pacing wires removed at this time. Patient tolerates well. Patient instructed on bedrest and verbalizes understanding. Will continue to monitor.   Emelda Fear, RN

## 2019-04-25 NOTE — Progress Notes (Signed)
Patient refused CPAP for the night  

## 2019-04-26 LAB — PROTIME-INR
INR: 1.6 — ABNORMAL HIGH (ref 0.8–1.2)
Prothrombin Time: 18.6 seconds — ABNORMAL HIGH (ref 11.4–15.2)

## 2019-04-26 LAB — BASIC METABOLIC PANEL
Anion gap: 10 (ref 5–15)
BUN: 27 mg/dL — ABNORMAL HIGH (ref 8–23)
CO2: 33 mmol/L — ABNORMAL HIGH (ref 22–32)
Calcium: 8.9 mg/dL (ref 8.9–10.3)
Chloride: 97 mmol/L — ABNORMAL LOW (ref 98–111)
Creatinine, Ser: 1.28 mg/dL — ABNORMAL HIGH (ref 0.44–1.00)
GFR calc Af Amer: 50 mL/min — ABNORMAL LOW (ref >60)
GFR calc non Af Amer: 43 mL/min — ABNORMAL LOW (ref >60)
Glucose, Bld: 105 mg/dL — ABNORMAL HIGH (ref 70–99)
Potassium: 4.3 mmol/L (ref 3.5–5.1)
Sodium: 140 mmol/L (ref 135–145)

## 2019-04-26 LAB — GLUCOSE, CAPILLARY
Glucose-Capillary: 93 mg/dL (ref 70–99)
Glucose-Capillary: 116 mg/dL — ABNORMAL HIGH (ref 70–99)

## 2019-04-26 MED ORDER — WARFARIN SODIUM 4 MG PO TABS: 4.0000 mg | ORAL_TABLET | Freq: Every day | ORAL | 1 refills | Status: DC

## 2019-04-26 MED ORDER — FUROSEMIDE 20 MG PO TABS: 20.0000 mg | ORAL_TABLET | Freq: Every day | ORAL | 1 refills | Status: DC

## 2019-04-26 MED ORDER — AMIODARONE HCL 200 MG PO TABS: 200.0000 mg | ORAL_TABLET | Freq: Two times a day (BID) | ORAL | 1 refills | Status: DC

## 2019-04-26 MED ORDER — MELATONIN 3 MG PO TABS: 3.0000 mg | ORAL_TABLET | Freq: Every evening | ORAL | 0 refills | Status: DC | PRN

## 2019-04-26 MED ORDER — METOPROLOL TARTRATE 25 MG PO TABS: 12.5000 mg | ORAL_TABLET | Freq: Two times a day (BID) | ORAL | 1 refills | Status: DC

## 2019-04-26 MED ORDER — ACETAMINOPHEN 325 MG PO TABS: 650.0000 mg | ORAL_TABLET | Freq: Four times a day (QID) | ORAL | Status: DC | PRN

## 2019-04-26 MED ORDER — POTASSIUM CHLORIDE CRYS ER 20 MEQ PO TBCR: 20.0000 meq | EXTENDED_RELEASE_TABLET | Freq: Every day | ORAL | 1 refills | Status: DC

## 2019-04-26 MED ORDER — OLANZAPINE 5 MG PO TBDP: 5.0000 mg | ORAL_TABLET | Freq: Every day | ORAL | 1 refills | Status: DC

## 2019-04-26 MED ORDER — SPIRONOLACTONE 25 MG PO TABS: 12.5000 mg | ORAL_TABLET | Freq: Every day | ORAL | 1 refills | Status: DC

## 2019-04-26 MED FILL — POTASSIUM CHLORIDE 20meqER: 20 | 30 days supply | Qty: 30 | Fill #0

## 2019-04-26 MED FILL — WARFARIN SODIUM 4 MG TABLET: 4 | 30 days supply | Qty: 30 | Fill #0

## 2019-04-26 MED FILL — SPIRONOLACTONE 25 MG TABLET: 25 | 30 days supply | Qty: 15 | Fill #0

## 2019-04-26 MED FILL — METOPROLOL TARTRATE 25 MG T: 25 | 30 days supply | Qty: 30 | Fill #0

## 2019-04-26 MED FILL — FUROSEMIDE 20 MG TAB: 20 | 30 days supply | Qty: 30 | Fill #0

## 2019-04-26 MED FILL — OLANZapine 5 MG TABS: 5 | 30 days supply | Qty: 30 | Fill #0

## 2019-04-26 MED FILL — AMIODARONE HCL 200 MG TAB: 200 | 30 days supply | Qty: 60 | Fill #0

## 2019-04-26 NOTE — Progress Notes (Signed)
MOBILITY TEAM - Progress Note   04/26/19 1200  Mobility  Activity Ambulated in room;Ambulated to bathroom  Level of Assistance Independent  Assistive Device None  Distance Ambulated (ft) 20 ft  Mobility Response Tolerated well  Mobility performed by Mobility specialist  Bed Position  (seated edge of bed to get dressed for d/c)   Pt ambulated to bathroom and in room without DME, some instability noted; recommended use of rolling walker for home, which pt agrees. With regards to sternal precautions, pt will say, "I know I shouldn't be doing this" (I.e. pulling chair closer to her) -- reinforced importance of maintaining these. SpO2 down to 90% on RA while pt performing ADL tasks; RN present and aware.  Mabeline Caras, PT, DPT Mobility Team Pager [phone]

## 2019-04-26 NOTE — Progress Notes (Addendum)
      [address]       [place] [address]             [phone]      13 Days Post-Op Procedure(s) (LRB): MITRAL VALVE REPAIR (MVR) using Memo 4D 34 MM Mitral Valve. (N/A) TRANSESOPHAGEAL ECHOCARDIOGRAM (TEE) (N/A) Clipping Of Atrial Appendage using AtriCure 45 MM AtriClip. (N/A) Subjective: Still needing oxygen at times. No other issues overnight.  Objective: Vital signs in last 24 hours: Temp:  [97.5 F (36.4 C)-98.3 F (36.8 C)] 97.5 F (36.4 C) (03/18 0440) Pulse Rate:  [80-94] 80 (03/18 0500) Cardiac Rhythm: Normal sinus rhythm;Heart block (03/18 0440) Resp:  [16-32] 18 (03/18 0500) BP: (104-121)/(50-71) 121/71 (03/18 0440) SpO2:  [90 %-100 %] 100 % (03/18 0500) Weight:  [97.9 kg] 97.9 kg (03/18 0500)     Intake/Output from previous day: 03/17 0701 - 03/18 0700 In: 250 [P.O.:250] Out: 300 [Urine:300] Intake/Output this shift: No intake/output data recorded.  General appearance: alert, cooperative and no distress Heart: regular rate and rhythm, S1, S2 normal, no murmur, click, rub or gallop Lungs: clear to auscultation bilaterally Abdomen: soft, non-tender; bowel sounds normal; no masses,  no organomegaly Extremities: extremities normal, atraumatic, no cyanosis or edema Wound: clean and dry  Lab Results: Recent Labs    04/24/19 0730  WBC 16.2*  HGB 8.3*  HCT 26.7*  PLT 199   BMET:  Recent Labs    04/24/19 0730 04/25/19 0450  NA 136 138  K 3.7 3.5  CL 90* 90*  CO2 36* 35*  GLUCOSE 109* 86  BUN 41* 37*  CREATININE 1.49* 1.34*  CALCIUM 8.8* 8.5*    PT/INR:  Recent Labs    04/26/19 0629  LABPROT 18.6*  INR 1.6*   ABG    Component Value Date/Time   PHART 7.482 (H) 04/20/2019 0352   HCO3 41.3 (H) 04/20/2019 0352   TCO2 43 (H) 04/20/2019 0352   O2SAT 93.0 04/20/2019 0352   CBG (last 3)  Recent Labs    04/25/19 1630 04/25/19 2059 04/26/19 0650  GLUCAP 98 117* 93    Assessment/Plan: S/P Procedure(s)  (LRB): MITRAL VALVE REPAIR (MVR) using Memo 4D 34 MM Mitral Valve. (N/A) TRANSESOPHAGEAL ECHOCARDIOGRAM (TEE) (N/A) Clipping Of Atrial Appendage using AtriCure 45 MM AtriClip. (N/A)   1.NSR in the80s. BP well controlled. Continue Amio, asa, statin, and low-dose BB.  2. Pulm-tolerating room air with good oxygenation.  3. Renal-creatinine 1.34,continue lasix 20mg  daily 4. H and H 8.3/26.7, dropped from a few days ago, expected acute blood loss anemia 5. Endo-blood glucose well controlled 6. INR 1.6, continue coumadin 4mg  7. Post-op delirium-on zyprexa.Resolved.  Plan: home today with [facility] PT/OT/RN. Will need oxygen at home-nursing to wean. instructions given to the patient and questions answered.    LOS: 16 days    [name] 04/26/2019   I have seen and examined the patient and agree with the assessment and plan as outlined.  D/C home on Coumadin 4 mg/day.  Rexene Alberts, MD 04/26/2019 9:09 AM

## 2019-04-26 NOTE — Progress Notes (Addendum)
Room air SPO2 88-90%,  2 LPM of O2 NCL given, able to maintain SPO2 > 92-96% at night. Auscultated lungs clear. Will monitor.  Kennyth Lose, RN

## 2019-04-26 NOTE — Progress Notes (Signed)
CARDIAC REHAB PHASE I   D/c education completed with pt and husband vis the phone. Pt educated on importance of site care and monitoring incision daily. Encouraged continued IS use, walks, and sternal precautions. Pt given in-the-tube sheet along with heart healthy diet. Reviewed restrictions and exercise guidelines. Will refer to CRP II [place] with knowledge pt needs to complete HH PT. Pt is interested in participating in Virtual Cardiac and Pulmonary Rehab. Pt advised that Virtual Cardiac and Pulmonary Rehab is provided at no cost to the patient.  Checklist:  1. Pt has smart device  ie smartphone and/or ipad for downloading an app  Yes 2. Reliable internet/wifi service    Yes 3. Understands how to use their smartphone and navigate within an app.  Yes  Pt verbalized understanding and is in agreement.  [id number] Rufina Falco, RN BSN 04/26/2019 9:35 AM

## 2019-04-26 NOTE — TOC Transition Note (Signed)
Transition of Care [facility]) - CM/SW Discharge Note   Patient Details  Name: Rebecca Carr MRN: [id number] Date of Birth: 1950-09-01  Transition of [facility]) CM/SW Contact:  Zenon Mayo, RN Phone Number: 04/26/2019, 12:35 PM   Clinical Narrative:    NCM notified Tommi Rumps with [facility] that patient is for dc today, she will also need home oxygen, she states that [employer] is ok to get the oxygen from.  NCM made referral to [facility] with [employer]. He will deliver oxygen to room prior to dc.    Final next level of care: [place] Barriers to Discharge: No Barriers Identified   Patient Goals and CMS Choice Patient states their goals for this hospitalization and ongoing recovery are:: to go home CMS Medicare.gov Compare Post Acute Care list provided to:: Patient Choice offered to / list presented to : Patient  Discharge Placement                       Discharge Plan and Services                DME Arranged: Oxygen DME Agency: [employer] Date DME Agency Contacted: 04/26/19 Time DME Agency Contacted: 1234 Representative spoke with at DME Agency: Thedore Mins HH Arranged: RN, PT, OT [facility] Agency: [place] Date [place]: 04/25/19 Time [place]: 13 Representative spoke with at [place]: [place] ([place]) Interventions     Readmission Risk Interventions No flowsheet data found.

## 2019-04-26 NOTE — Progress Notes (Signed)
SATURATION QUALIFICATIONS: (This note is used to comply with regulatory documentation for home oxygen)  Patient Saturations on Room Air at Rest = 90%  Patient Saturations on Room Air while Ambulating = 84%  Patient Saturations on 2 Liters of oxygen while Ambulating = 94%  Please briefly explain why patient needs home oxygen: Pt's sats will drop too low when walking on RA.

## 2019-04-26 NOTE — Progress Notes (Signed)
04/26/2019 2:18 PM Chest tube sutures removed.  Pt tolerated well. [place]

## 2019-04-26 NOTE — Progress Notes (Addendum)
Advanced Heart Failure Rounding Note  PCP-Cardiologist: No primary care provider on file.   Subjective:    S/p complex MV repair on 3/5   Sitting up in bed. Delirium resolved. Denies CP. No dyspnea. Wt trending back down.   Objective:   Weight Range: 97.9 kg Body mass index is 32.81 kg/m.   Vital Signs:   Temp:  [97.5 F (36.4 C)-98.3 F (36.8 C)] 97.5 F (36.4 C) (03/18 0440) Pulse Rate:  [80-94] 80 (03/18 0500) Resp:  [16-32] 18 (03/18 0500) BP: (104-121)/(50-71) 121/71 (03/18 0440) SpO2:  [90 %-100 %] 100 % (03/18 0500) Weight:  [97.9 kg] 97.9 kg (03/18 0500) Last BM Date: 04/25/19  Weight change: Filed Weights   04/23/19 0529 04/25/19 0127 04/26/19 0500  Weight: 97.4 kg 98.3 kg 97.9 kg    Intake/Output:   Intake/Output Summary (Last 24 hours) at 04/26/2019 0704 Last data filed at 04/25/2019 2020 Gross per 24 hour  Intake 250 ml  Output 300 ml  Net -50 ml      Physical Exam    General:  Well appearing, moderately obese WF. No respiratory difficulty HEENT: normal Neck: supple. no JVD. Carotids 2+ bilat; no bruits. No lymphadenopathy or thyromegaly appreciated. Cor: Sternotomy incision stable. PMI nondisplaced. Regular rate & rhythm. No rubs, gallops or murmurs. Lungs: clear Abdomen: soft, nontender, nondistended. No hepatosplenomegaly. No bruits or masses. Good bowel sounds. Extremities: no cyanosis, clubbing, rash, edema Neuro: alert & oriented x 3, cranial nerves grossly intact. moves all 4 extremities w/o difficulty. Affect pleasant.   Telemetry   NSR w/ 1st degree AVB. 80s-90s Personally reviewed  Labs    CBC Recent Labs    04/24/19 0730  WBC 16.2*  HGB 8.3*  HCT 26.7*  MCV 92.4  PLT [id number]   Basic Metabolic Panel Recent Labs    04/24/19 0730 04/25/19 0450  NA 136 138  K 3.7 3.5  CL 90* 90*  CO2 36* 35*  GLUCOSE 109* 86  BUN 41* 37*  CREATININE 1.49* 1.34*  CALCIUM 8.8* 8.5*  MG 2.1  --    Liver Function Tests Recent  Labs    04/24/19 0730  AST 39  ALT 49*  ALKPHOS 97  BILITOT 1.3*  PROT 5.8*  ALBUMIN 2.8*   No results for input(s): LIPASE, AMYLASE in the last 72 hours. Cardiac Enzymes No results for input(s): CKTOTAL, CKMB, CKMBINDEX, TROPONINI in the last 72 hours.  BNP: BNP (last 3 results) Recent Labs    04/24/19 0730  BNP 128.8*    ProBNP (last 3 results) No results for input(s): PROBNP in the last 8760 hours.   D-Dimer No results for input(s): DDIMER in the last 72 hours. Hemoglobin A1C No results for input(s): HGBA1C in the last 72 hours. Fasting Lipid Panel No results for input(s): CHOL, HDL, LDLCALC, TRIG, CHOLHDL, LDLDIRECT in the last 72 hours. Thyroid Function Tests No results for input(s): TSH, T4TOTAL, T3FREE, THYROIDAB in the last 72 hours.  Invalid input(s): FREET3  Other results:   Imaging    No results found.   Medications:     Scheduled Medications: . amiodarone  200 mg Oral BID  . aspirin EC  81 mg Oral QHS  . atorvastatin  40 mg Oral QHS  . budesonide (PULMICORT) nebulizer solution  0.5 mg Nebulization BID  . chlorhexidine  15 mL Mouth Rinse BID  . Chlorhexidine Gluconate Cloth  6 each Topical Daily  . [facility], Patient & Family Education  Does not apply Once  . docusate sodium  200 mg Oral Daily  . feeding supplement (GLUCERNA SHAKE)  237 mL Oral BID BM  . FLUoxetine  20 mg Oral q1800  . furosemide  20 mg Oral Daily  . insulin aspart  0-20 Units Subcutaneous TID WC  . insulin aspart  0-5 Units Subcutaneous QHS  . levothyroxine  125 mcg Oral QAC breakfast  . liothyronine  5 mcg Oral Daily  . mouth rinse  15 mL Mouth Rinse q12n4p  . metoprolol tartrate  12.5 mg Oral BID  . OLANZapine zydis  5 mg Oral Daily  . pantoprazole  40 mg Oral Daily  . polyethylene glycol  17 g Oral BID  . potassium chloride  20 mEq Oral Daily  . senna-docusate  1 tablet Oral BID  . spironolactone  12.5 mg Oral Daily  . warfarin  4 mg Oral q1800   . Warfarin - Physician Dosing Inpatient   Does not apply q1800    Infusions: . sodium chloride Stopped (04/20/19 1229)    PRN Medications: sodium chloride, acetaminophen, bisacodyl, Melatonin, metoprolol tartrate, ondansetron (ZOFRAN) IV, sodium chloride flush   Assessment/Plan    1. Mitral Regurgitation, severe - ECHO/TEE with severe MR P2 prolapse - s/p complex MV repair 3/5  2. A/C Diastolic Heart Failure/pulmonary HTN - ECHO 03/2019 EF 60-65% grade II DD  - s/p MVR.  - Volume status  ok.  - Continue lasix 20 daily.   3. Acute postop hypoxic respiratory failure -Continue incentive spirometry. -Refuses nighttime BiPAP.  4. Acute blood loss anemia -Hemoglobin stable, 8.3 yesterday    5. H/o lupus and Sjogren's disease - followed at [facility] - her neck/ jaw pain in the area of the Stensen's duct may possibly be related to Sjogren's syndrome and salivary gland involvement.  - CT unremarkable.  - Completed 3 days of cefepime.   6.  Hypokalemia/hypomagnesemia. - K 3.5 yesterday  - Spironolactone 12.5 mg added. - No AM labs ordered. Will order f/u BMP   7. PAF - She is status post LAA clipping  - converted to sinus rhythm on 3/13 - Maintaining NSR. Continue amio 200 bid - Started on coumadin 04/19/19.  - INR 1.6  Dosing per TCTS  8. Post-op delirium -Resolved.  -Can stop Zyprexa   9. AKI:  - baseline SCr <1.0 - Creatinine trending down 1.4>1.67>1.49 > 1.34 - Follow BMET. Labs pending.   Dispo: per CT surgery, she will be discharged home today.   Cardiac Meds for D/C Amiodarone 200 mg bid ASA 81 mg Atorvastatin 40 mg qhs Lasix 20 mg qd KCl 20 mEq qd Metoprolol 12.5 mg bid  Spironolactone 12.5 qd Warfarin (dosing per TCTS)  We will arrange post hospital f/u with Dr. Haroldine Laws. Appt info in AVS.       Length of Stay: [address] PA-C  04/26/2019, 7:04 AM  Advanced Heart Failure Team Pager [phone] (M-F; 7a - 4p)  Please  contact [facility] for night-coverage after hours (4p -7a ) and weekends on [email or web address]  Patient seen and examined with the above-signed Advanced Practice Provider and/or Housestaff. I personally reviewed laboratory data, imaging studies and relevant notes. I independently examined the patient and formulated the important aspects of the plan. I have edited the note to reflect any of my changes or salient points. I have personally discussed the plan with the patient and/or family.  Much improved. Volume status looks good. Delirium resolved. Maintaining NSR. Can go home  today on above meds.   May need home O2 if not already in place.   Glori Bickers, MD  9:43 AM

## 2019-04-28 DIAGNOSIS — Z86010 Personal history of colonic polyps: Secondary | ICD-10-CM | POA: Diagnosis not present

## 2019-04-28 DIAGNOSIS — I2510 Atherosclerotic heart disease of native coronary artery without angina pectoris: Secondary | ICD-10-CM | POA: Diagnosis not present

## 2019-04-28 DIAGNOSIS — Z9981 Dependence on supplemental oxygen: Secondary | ICD-10-CM | POA: Diagnosis not present

## 2019-04-28 DIAGNOSIS — J449 Chronic obstructive pulmonary disease, unspecified: Secondary | ICD-10-CM | POA: Diagnosis not present

## 2019-04-28 DIAGNOSIS — I272 Pulmonary hypertension, unspecified: Secondary | ICD-10-CM | POA: Diagnosis not present

## 2019-04-28 DIAGNOSIS — R002 Palpitations: Secondary | ICD-10-CM | POA: Diagnosis not present

## 2019-04-28 DIAGNOSIS — L308 Other specified dermatitis: Secondary | ICD-10-CM | POA: Diagnosis not present

## 2019-04-28 DIAGNOSIS — J9600 Acute respiratory failure, unspecified whether with hypoxia or hypercapnia: Secondary | ICD-10-CM | POA: Diagnosis not present

## 2019-04-28 DIAGNOSIS — Z9181 History of falling: Secondary | ICD-10-CM | POA: Diagnosis not present

## 2019-04-28 DIAGNOSIS — I471 Supraventricular tachycardia: Secondary | ICD-10-CM | POA: Diagnosis not present

## 2019-04-28 DIAGNOSIS — D071 Carcinoma in situ of vulva: Secondary | ICD-10-CM | POA: Diagnosis not present

## 2019-04-28 DIAGNOSIS — I2584 Coronary atherosclerosis due to calcified coronary lesion: Secondary | ICD-10-CM | POA: Diagnosis not present

## 2019-04-28 DIAGNOSIS — K219 Gastro-esophageal reflux disease without esophagitis: Secondary | ICD-10-CM | POA: Diagnosis not present

## 2019-04-28 DIAGNOSIS — I493 Ventricular premature depolarization: Secondary | ICD-10-CM | POA: Diagnosis not present

## 2019-04-28 DIAGNOSIS — G4733 Obstructive sleep apnea (adult) (pediatric): Secondary | ICD-10-CM | POA: Diagnosis not present

## 2019-04-28 DIAGNOSIS — J9610 Chronic respiratory failure, unspecified whether with hypoxia or hypercapnia: Secondary | ICD-10-CM | POA: Diagnosis not present

## 2019-04-28 DIAGNOSIS — Z48812 Encounter for surgical aftercare following surgery on the circulatory system: Secondary | ICD-10-CM | POA: Diagnosis not present

## 2019-04-28 DIAGNOSIS — J9601 Acute respiratory failure with hypoxia: Secondary | ICD-10-CM | POA: Diagnosis not present

## 2019-04-28 DIAGNOSIS — I5033 Acute on chronic diastolic (congestive) heart failure: Secondary | ICD-10-CM | POA: Diagnosis not present

## 2019-04-28 DIAGNOSIS — Z7901 Long term (current) use of anticoagulants: Secondary | ICD-10-CM | POA: Diagnosis not present

## 2019-04-28 DIAGNOSIS — Z7982 Long term (current) use of aspirin: Secondary | ICD-10-CM | POA: Diagnosis not present

## 2019-04-29 ENCOUNTER — Encounter: Payer: Self-pay | Admitting: Cardiology

## 2019-04-30 ENCOUNTER — Ambulatory Visit (INDEPENDENT_AMBULATORY_CARE_PROVIDER_SITE_OTHER): Payer: Medicare Other | Admitting: Pharmacist

## 2019-04-30 ENCOUNTER — Other Ambulatory Visit: Payer: Self-pay

## 2019-04-30 DIAGNOSIS — I9789 Other postprocedural complications and disorders of the circulatory system, not elsewhere classified: Secondary | ICD-10-CM | POA: Insufficient documentation

## 2019-04-30 DIAGNOSIS — Z7901 Long term (current) use of anticoagulants: Secondary | ICD-10-CM | POA: Insufficient documentation

## 2019-04-30 DIAGNOSIS — I4891 Unspecified atrial fibrillation: Secondary | ICD-10-CM | POA: Insufficient documentation

## 2019-04-30 DIAGNOSIS — Z9889 Other specified postprocedural states: Secondary | ICD-10-CM

## 2019-04-30 LAB — POCT INR: INR: 4.4 — AB (ref 2.0–3.0)

## 2019-04-30 NOTE — Patient Instructions (Signed)
*  Call back at [phone] for coumadin clinic at [facility]*

## 2019-05-03 ENCOUNTER — Ambulatory Visit (INDEPENDENT_AMBULATORY_CARE_PROVIDER_SITE_OTHER): Payer: Medicare Other | Admitting: Cardiovascular Disease

## 2019-05-03 DIAGNOSIS — Z9889 Other specified postprocedural states: Secondary | ICD-10-CM | POA: Diagnosis not present

## 2019-05-03 DIAGNOSIS — Z7901 Long term (current) use of anticoagulants: Secondary | ICD-10-CM | POA: Diagnosis not present

## 2019-05-03 DIAGNOSIS — I4891 Unspecified atrial fibrillation: Secondary | ICD-10-CM | POA: Diagnosis not present

## 2019-05-03 LAB — POCT INR: INR: 4.4 — AB (ref 2.0–3.0)

## 2019-05-04 ENCOUNTER — Telehealth: Payer: Self-pay

## 2019-05-04 NOTE — Telephone Encounter (Signed)
Pt called office d/t having problems sleeping. She is s/p MVR on 04/13/19. She wants to know if it would be okay for her to sleep on her side and if she could take one of her husband's Trazodone to help her sleep. Pt states she is taking Tylenol for sternal pain at night and is satisfied w/ pain control. She states she has tried taking melatonin for sleep w/o success. Advised that it is okay to sleep on her side by propping over slightly w/ a pillow for support. Instructed to try taking Tylenol PM for sleep and to call back if this is not effective. Also instructed to call [facility] for other problems or concerns.

## 2019-05-07 ENCOUNTER — Telehealth: Payer: Self-pay | Admitting: Physician Assistant

## 2019-05-07 ENCOUNTER — Ambulatory Visit: Payer: Medicare Other | Admitting: Student

## 2019-05-07 LAB — POCT INR: INR: 2.3 (ref 2.0–3.0)

## 2019-05-07 NOTE — Telephone Encounter (Signed)
Returned call to pt she states that she needs her husband to come with her to appt because she is having s/e (stuttering) from anesthesia, pt  notified of current OV occupancy restrictions d/t COVID. Verbalizes understanding.

## 2019-05-07 NOTE — Telephone Encounter (Signed)
Patient states she needs her husband to come with her to the appt on 4/1 as she is still feeling affects from the anesthesia. Patient states you can leave message.

## 2019-05-08 ENCOUNTER — Ambulatory Visit (INDEPENDENT_AMBULATORY_CARE_PROVIDER_SITE_OTHER): Payer: Medicare Other | Admitting: Pharmacist

## 2019-05-08 DIAGNOSIS — I4891 Unspecified atrial fibrillation: Secondary | ICD-10-CM | POA: Diagnosis not present

## 2019-05-08 DIAGNOSIS — Z7901 Long term (current) use of anticoagulants: Secondary | ICD-10-CM

## 2019-05-09 DIAGNOSIS — I4891 Unspecified atrial fibrillation: Secondary | ICD-10-CM | POA: Diagnosis not present

## 2019-05-09 DIAGNOSIS — Z9889 Other specified postprocedural states: Secondary | ICD-10-CM | POA: Diagnosis not present

## 2019-05-09 DIAGNOSIS — I340 Nonrheumatic mitral (valve) insufficiency: Secondary | ICD-10-CM | POA: Diagnosis not present

## 2019-05-09 DIAGNOSIS — H539 Unspecified visual disturbance: Secondary | ICD-10-CM | POA: Diagnosis not present

## 2019-05-09 DIAGNOSIS — Z09 Encounter for follow-up examination after completed treatment for conditions other than malignant neoplasm: Secondary | ICD-10-CM | POA: Diagnosis not present

## 2019-05-10 ENCOUNTER — Ambulatory Visit (INDEPENDENT_AMBULATORY_CARE_PROVIDER_SITE_OTHER): Payer: Medicare Other | Admitting: Physician Assistant

## 2019-05-10 ENCOUNTER — Encounter (HOSPITAL_COMMUNITY): Payer: Self-pay | Admitting: Internal Medicine

## 2019-05-10 ENCOUNTER — Encounter: Payer: Self-pay | Admitting: Physician Assistant

## 2019-05-10 ENCOUNTER — Other Ambulatory Visit: Payer: Self-pay

## 2019-05-10 ENCOUNTER — Ambulatory Visit (HOSPITAL_COMMUNITY)
Admit: 2019-05-10 | Discharge: 2019-05-10 | Disposition: A | Discharge status: Home / Self Care | Payer: Medicare Other | Source: Ambulatory Visit | Attending: Internal Medicine | Admitting: Internal Medicine

## 2019-05-10 VITALS — BP 110/76 | HR 76 | Wt 223.8 lb

## 2019-05-10 VITALS — BP 115/79 | HR 83 | Temp 97.2°F | Ht 68.0 in | Wt 223.6 lb

## 2019-05-10 DIAGNOSIS — Z7982 Long term (current) use of aspirin: Secondary | ICD-10-CM | POA: Diagnosis not present

## 2019-05-10 DIAGNOSIS — I4891 Unspecified atrial fibrillation: Secondary | ICD-10-CM | POA: Diagnosis not present

## 2019-05-10 DIAGNOSIS — E063 Autoimmune thyroiditis: Secondary | ICD-10-CM | POA: Insufficient documentation

## 2019-05-10 DIAGNOSIS — Z8249 Family history of ischemic heart disease and other diseases of the circulatory system: Secondary | ICD-10-CM | POA: Diagnosis not present

## 2019-05-10 DIAGNOSIS — I2584 Coronary atherosclerosis due to calcified coronary lesion: Secondary | ICD-10-CM

## 2019-05-10 DIAGNOSIS — Z952 Presence of prosthetic heart valve: Secondary | ICD-10-CM | POA: Diagnosis not present

## 2019-05-10 DIAGNOSIS — M1990 Unspecified osteoarthritis, unspecified site: Secondary | ICD-10-CM | POA: Insufficient documentation

## 2019-05-10 DIAGNOSIS — Z4682 Encounter for fitting and adjustment of non-vascular catheter: Secondary | ICD-10-CM | POA: Insufficient documentation

## 2019-05-10 DIAGNOSIS — G4733 Obstructive sleep apnea (adult) (pediatric): Secondary | ICD-10-CM | POA: Diagnosis not present

## 2019-05-10 DIAGNOSIS — R7303 Prediabetes: Secondary | ICD-10-CM | POA: Insufficient documentation

## 2019-05-10 DIAGNOSIS — I480 Paroxysmal atrial fibrillation: Secondary | ICD-10-CM | POA: Diagnosis not present

## 2019-05-10 DIAGNOSIS — I2510 Atherosclerotic heart disease of native coronary artery without angina pectoris: Secondary | ICD-10-CM | POA: Diagnosis not present

## 2019-05-10 DIAGNOSIS — E876 Hypokalemia: Secondary | ICD-10-CM | POA: Diagnosis not present

## 2019-05-10 DIAGNOSIS — Z9889 Other specified postprocedural states: Secondary | ICD-10-CM

## 2019-05-10 DIAGNOSIS — E785 Hyperlipidemia, unspecified: Secondary | ICD-10-CM | POA: Diagnosis not present

## 2019-05-10 DIAGNOSIS — J449 Chronic obstructive pulmonary disease, unspecified: Secondary | ICD-10-CM | POA: Diagnosis not present

## 2019-05-10 DIAGNOSIS — Z87891 Personal history of nicotine dependence: Secondary | ICD-10-CM | POA: Insufficient documentation

## 2019-05-10 DIAGNOSIS — E039 Hypothyroidism, unspecified: Secondary | ICD-10-CM | POA: Diagnosis not present

## 2019-05-10 DIAGNOSIS — F419 Anxiety disorder, unspecified: Secondary | ICD-10-CM | POA: Insufficient documentation

## 2019-05-10 DIAGNOSIS — I340 Nonrheumatic mitral (valve) insufficiency: Secondary | ICD-10-CM | POA: Diagnosis not present

## 2019-05-10 DIAGNOSIS — D649 Anemia, unspecified: Secondary | ICD-10-CM

## 2019-05-10 DIAGNOSIS — Z79899 Other long term (current) drug therapy: Secondary | ICD-10-CM

## 2019-05-10 DIAGNOSIS — K2100 Gastro-esophageal reflux disease with esophagitis, without bleeding: Secondary | ICD-10-CM | POA: Insufficient documentation

## 2019-05-10 DIAGNOSIS — I341 Nonrheumatic mitral (valve) prolapse: Secondary | ICD-10-CM | POA: Insufficient documentation

## 2019-05-10 DIAGNOSIS — Z7901 Long term (current) use of anticoagulants: Secondary | ICD-10-CM | POA: Insufficient documentation

## 2019-05-10 DIAGNOSIS — I5032 Chronic diastolic (congestive) heart failure: Secondary | ICD-10-CM | POA: Diagnosis not present

## 2019-05-10 NOTE — Progress Notes (Signed)
[facility]  NOTE  TCTS: Roxy Manns Primary Cardiologist: Ellyn Hack  HPI:  Marienne Hanish is a 69 y.o. female with a hx of antiphospholipid syndrome, COPD, GERD, Hashimoto's thyroiditis, HLD, hypothyroidism, OSA and severe MR s/p recent mitral valve repair by Dr. Roxy Manns on 04/13/2019.    Patient had a echocardiogram on 03/12/2019 with Dr. Ellyn Hack that showed EF 60 to 65%, grade 2 DD, significant posterior mitral valve leaflet prolapse with moderate to severe MR.  Myoview obtained on 03/11/2018 showed EF 65 to 70%, no ischemia or infarction. Patient underwent TEE on 04/10/2019 that showed normal LV size and function, severe biatrial enlargement, myxomatous proliferation of the posterior mitral valve leaflet was flail P2 component of the posterior mitral valve leaflet with associated severe eccentric mitral valve regurgitation directed towards the septum.  Hypermobile intra-atrial septum with PFO by agitated saline contrast injection. R/L heart cath performed on the same day showed normal coronary arteries, cardiac output 5.2, cardiac index 2.37 PA 67/27 (43) PCWP 33 with v = 62.    During the hospitalization, patient was seen by heart failure service for medication optimization prior to possible valve surgery.  She ultimately underwent mitral valve repair with complex valvuloplasty using a Sorin Memo 4D ring annuloplasty size 34 mm, artificial Gore-Tex neocord placement x6, clipping of left atrial appendage by Dr. Roxy Manns on 04/13/2019.  Patient was initially extubated however reintubated due to respiratory distress.  She was treated with inhaled nitric oxide.  Postprocedure, he was treated with IV Lasix for volume overload.  She did have postop atrial fibrillation and required amiodarone drip during the hospitalization.  After she was transitioned to oral amiodarone, she continued to be in and out of atrial fibrillation throughout hospitalization.  Coumadin was started on 04/19/2019.  Diuretic was eventually weaned  down to 20 mg Lasix daily prior to discharge. Post-op also had hallucinations treated successfully with Zyprexa.  Here for post-hospital f/u. Feels ok. Getting around slowly but able to do all ADLS. No edema. No palpitations. No orthopnea or PND. No fevers or chills. Sternal site healing well. Chest a bit sore.     Past Medical History:  Diagnosis Date  . Antiphospholipid syndrome ([place])   . Anxiety   . Arthritis   . Clotting disorder ([place])   . COPD (chronic obstructive pulmonary disease) ([employer])   . DES exposure in utero   . Diverticulosis of colon   . Dizziness   . Environmental and seasonal allergies   . GERD (gastroesophageal reflux disease)   . 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  Marland Kitchen Hashimoto's thyroiditis   . Heart palpitations   . Hiatal hernia   . History of adenomatous polyp of colon    hyperplastic 2015  and tubular adenoma 2005  . History of esophageal dilatation    of stricture  . History of esophagitis    LA class , grade B and Gastritis  . History of pituitary tumor    dx 1990's - asymptomatic /  per pt last scan 2005 approx.  resolved   . Hyperlipidemia   . Hypothyroidism   . Leg pain, bilateral   . Mild intermittent asthma   . MTHFR mutation ([place])   . OSA (obstructive sleep apnea)    non-compliant cpap  . Prediabetes   . S/P mitral valve repair 04/13/2019   Complex valvuloplasty including triangular resection of  flail posterior leaflet + artificial Gore-tex neochords x6 and 34 mm Memo 4D ring annuloplasty  . SUI (stress urinary incontinence, female)   . VIN III (vulvar intraepithelial neoplasia III)   . Wears glasses     Current Outpatient Medications  Medication Sig Dispense Refill  . acetaminophen (TYLENOL) 325 MG tablet Take 2 tablets (650 mg total) by mouth every 6 (six) hours as needed (back pain).    Marland Kitchen albuterol (ACCUNEB)  0.63 MG/3ML nebulizer solution Take 1 ampule by nebulization every 6 (six) hours as needed for wheezing.    Marland Kitchen amiodarone (PACERONE) 200 MG tablet Take 1 tablet (200 mg total) by mouth 2 (two) times daily. 60 tablet 1  . aspirin EC 81 MG tablet Take 81 mg by mouth at bedtime. Reported on 07/16/2015    . atorvastatin (LIPITOR) 40 MG tablet Take 40 mg by mouth at bedtime.     . Cyanocobalamin (VITAMIN B-12) 5000 MCG LOZG Take 5,000 mcg by mouth daily.    Marland Kitchen FLUoxetine (PROZAC) 20 MG capsule Take 20 mg by mouth daily at 6 PM. (1700)    . fluticasone (FLONASE) 50 MCG/ACT nasal spray Place 2 sprays into both nostrils at bedtime.     . furosemide (LASIX) 20 MG tablet Take 1 tablet (20 mg total) by mouth daily. 30 tablet 1  . levothyroxine (SYNTHROID) 125 MCG tablet Take 125 mcg by mouth daily before breakfast.    . liothyronine (CYTOMEL) 5 MCG tablet Take 5 mcg by mouth daily.    . Melatonin 3 MG TABS Take 1 tablet (3 mg total) by mouth at bedtime as needed (sleep).  0  . metoprolol tartrate (LOPRESSOR) 25 MG tablet Take 0.5 tablets (12.5 mg total) by mouth 2 (two) times daily. 30 tablet 1  . Multiple Vitamin (MULTIVITAMIN WITH MINERALS) TABS tablet Take 1 tablet by mouth daily. Multivitamin Essentials    . OLANZapine zydis (ZYPREXA) 5 MG disintegrating tablet Take 1 tablet (5 mg total) by mouth daily. 30 tablet 1  . omeprazole (PRILOSEC) 40 MG capsule Take 40 mg by mouth daily.    . potassium chloride SA (KLOR-CON) 20 MEQ tablet Take 1 tablet (20 mEq total) by mouth daily. 30 tablet 1  . warfarin (COUMADIN) 4 MG tablet Take 4 mg by mouth daily. 1/2 TABLET 6 DAYS// 1 TABLET 1 DAY     No current facility-administered medications for this encounter.    Allergies  Allergen Reactions  . Iodine Anaphylaxis  . Iohexol Anaphylaxis     Desc: Patient states she is allergic to iodine and had a "code blue" incident after an injection for some type of imaging study.   . Penicillins Rash    PCN IN LARGE DOSES.  Did it involve swelling of the face/tongue/throat, SOB, or low BP? No Did it involve sudden or severe rash/hives, skin peeling, or any reaction on the inside of your mouth or nose? No Did you need to seek medical attention at a hospital or doctor's office? No When did it last happen? More than 10 years ago If all above answers are "NO", may proceed with cephalosporin use.       Social History   Socioeconomic History  . Marital status: Married    Spouse name: Jenny Reichmann  . Number of children: 1  . Years of education: Not on file  . Highest education level: Not on file  Occupational History  . Occupation: CUSTOMER SERVICE    Employer: [employer]    Comment: Is about  to retire  Tobacco Use  . Smoking status: Former Smoker    Packs/day: 1.00    Years: 48.00    Pack years: 48.00    Types: Cigarettes    Quit date: 05/10/2014    Years since quitting: 5.0  . Smokeless tobacco: Never Used  . Tobacco comment: using nicorrette  Substance and Sexual Activity  . Alcohol use: Yes    Alcohol/week: 2.0 standard drinks    Types: 2 Standard drinks or equivalent per week    Comment: Social - > is now essentially quit  . Drug use: No  . Sexual activity: Not on file  Other Topics Concern  . Not on file  Social History Narrative   No routine exercise. Describes herself as "lazy "   Social Determinants of Health   Financial Resource Strain:   . Difficulty of Paying Living Expenses:   Food Insecurity:   . Worried About Charity fundraiser in the Last Year:   . Arboriculturist in the Last Year:   Transportation Needs:   . Film/video editor (Medical):   Marland Kitchen Lack of Transportation (Non-Medical):   Physical Activity:   . Days of Exercise per Week:   . Minutes of Exercise per Session:   Stress:   . Feeling of Stress :   Social Connections:   . Frequency of Communication with Friends and Family:   . Frequency of Social Gatherings with Friends and Family:   . Attends Religious  Services:   . Active Member of Clubs or Organizations:   . Attends Archivist Meetings:   Marland Kitchen Marital Status:   Intimate Partner Violence:   . Fear of Current or Ex-Partner:   . Emotionally Abused:   Marland Kitchen Physically Abused:   . Sexually Abused:       Family History  Problem Relation Age of Onset  . Pancreatic cancer Father   . Colon cancer Father   . Heart attack Mother   . Heart disease Mother   . Heart attack Brother   . COPD Brother   . Dementia Brother   . Colon cancer Maternal Grandfather   . Rectal cancer Neg Hx   . Stomach cancer Neg Hx   . Esophageal cancer Neg Hx     Vitals:   05/10/19 1246  BP: 110/76  Pulse: 76  SpO2: 95%  Weight: 101.5 kg (223 lb 12.8 oz)    PHYSICAL EXAM: General:  Well appearing. No respiratory difficulty HEENT: normal Neck: supple. no JVD. Carotids 2+ bilat; no bruits. No lymphadenopathy or thryomegaly appreciated. Cor: Sternum stable. Well approximated. PMI nondisplaced. Regular rate & rhythm. No rubs, gallops or murmurs. Lungs: clear Abdomen: obese soft, nontender, nondistended. No hepatosplenomegaly. No bruits or masses. Good bowel sounds. Extremities: no cyanosis, clubbing, rash, edema Neuro: alert & oriented x 3, cranial nerves grossly intact. moves all 4 extremities w/o difficulty. Affect pleasant.  ECG: NSR 77 1AVB 24ms No ST-T wave abnormalities. Personally reviewed    ASSESSMENT & PLAN:  1. Severe MR - s/p mini MV repair with Dr. Roxy Manns 3/21 - doing great post-op. Volume status looks good - I removed a chest tube stitch - sees TCTS next week - consider CR - Needs SBE prophylaxis with all procedures  2. Chronic diastolic HF - much improved s/p MVR - volume looks good. Continue current regimen  3. Post-op AF - now in NSR - cut amio to 200 daily.  - can likely stop when she sees Dr.  Roxy Manns next week   Doing very well post-op. Can f/u with Dr. Ellyn Hack. We will be available to help as needed.   Glori Bickers, MD  2:20 PM

## 2019-05-10 NOTE — Progress Notes (Addendum)
Cardiology Office Note:    Date:  05/12/2019   ID:  Rebecca Carr, DOB Jul 10, 1950, MRN [id number]  PCP:  Rebecca Graff, PA-C  Cardiologist:  Rebecca Hew, MD  Electrophysiologist:  None   Referring MD: Rebecca Graff, PA-C   Chief Complaint  Patient presents with  . Follow-up    seen for Dr. Ellyn Carr    History of Present Illness:    Rebecca Carr is a 69 y.o. female with a hx of antiphospholipid syndrome, COPD, GERD, Hashimoto's thyroiditis, HLD, hypothyroidism, OSA and recent mitral valve repair.  Patient had a echocardiogram on 03/12/2019 that showed EF 60 to 65%, grade 2 DD, significant posterior mitral valve leaflet prolapse with moderate to severe MR.  Myoview obtained on 03/11/2018 showed EF 65 to 70%, no ischemia or infarction.  During her previous study with Dr. Ellyn Carr, she described frequent palpitation, tachycardia and dyspnea on exertion.  She was started on Lasix and beta-blocker was increased.  Patient underwent TEE on 04/10/2019 that showed normal LV size and function, severe biatrial enlargement, myxomatous proliferation of the posterior mitral valve leaflet was flail P2 component of the posterior mitral valve leaflet with associated severe eccentric mitral valve regurgitation directed towards the septum.  Hypermobile intra-atrial septum with PFO by agitated saline contrast injection.  Left and right heart cath performed on the same day showed normal coronary arteries, cardiac output 5.2, cardiac index 2.37.  During the hospitalization, patient was seen by heart failure service for medication optimization prior to possible valve surgery.  She ultimately underwent mitral valve repair with complex valvuloplasty using a Sorin Memo 4D ring annuloplasty size 34 mm, artificial Gore-Tex neocord placement x6, clipping of left atrial appendage by Dr. Roxy Carr on 04/13/2019.  Patient was initially extubated however reintubated due to respiratory distress.  She was treated with inhaled  nitric oxide.  Postprocedure, he was treated with IV Lasix for volume overload.  She did have postop atrial fibrillation and required placement of amiodarone drip during the hospitalization.  After she was transitioned to oral amiodarone, she continued to be in and out of atrial fibrillation throughout hospitalization.  Coumadin was started on 04/19/2019.  Diuretic was eventually weaned down to 20 mg Lasix daily prior to discharge.  Patient presents today for cardiology visit. She continues to have some chest soreness, however her sternotomy scar is very well-healed.  She is getting back to some degree of her activity level and has been working with a home physical therapist.  She has no lower extremity edema, orthopnea or PND.  Unfortunately her Carr concern was dizziness.  Even prior to the surgery, she has some orthostatic dizziness, this has significantly worsened since then.  I recommend a basic metabolic panel to make sure she is not being dehydrated.  I also recommended discontinuing the spironolactone as I do not think she is tolerating this medication.  I temporarily decided to leave her on the current diuretic even though this may be the main culprit for her orthostatic dizziness.  In the future, I may decided to switch her Lasix to as needed only.  I also instructed her to continue 200 mg twice daily of amiodarone for 2 more weeks then decrease to 200 mg daily started on 05/24/2019.  She is aware that she will require annual lab work.  On the next follow-up, she will need a TSH and liver function test given amiodarone therapy.  Past Medical History:  Diagnosis Date  . Antiphospholipid syndrome ([place])   .  Anxiety   . Arthritis   . Clotting disorder ([place])   . COPD (chronic obstructive pulmonary disease) ([place])   . DES exposure in utero   . Diverticulosis of colon   . Dizziness   . Environmental and seasonal allergies   . GERD (gastroesophageal reflux disease)   . 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  Marland Kitchen Hashimoto's thyroiditis   . Heart palpitations   . Hiatal hernia   . History of adenomatous polyp of colon    hyperplastic 2015  and tubular adenoma 2005  . History of esophageal dilatation    of stricture  . History of esophagitis    LA class , grade B and Gastritis  . History of pituitary tumor    dx 1990's - asymptomatic /  per pt last scan 2005 approx.  resolved   . Hyperlipidemia   . Hypothyroidism   . Leg pain, bilateral   . Mild intermittent asthma   . MTHFR mutation ([place])   . OSA (obstructive sleep apnea)    non-compliant cpap  . Prediabetes   . 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 SUI (stress urinary incontinence, female)   . VIN III (vulvar intraepithelial neoplasia III)   . Wears glasses     Past Surgical History:  Procedure Laterality Date  . BUBBLE STUDY  04/10/2019   Procedure: BUBBLE STUDY;  Surgeon: Sueanne Margarita, MD;  Location: [facility] ENDOSCOPY;  Service: Cardiovascular;;  . CARDIAC EVENT MONITOR  July-August 2018   Normal sinus rhythm with sinus tachycardia.  Average heart rate 91 bpm.  Occasional PVCs.  One 12 and another 6 beat run of wide-complex tachycardia.  2 morphology PVCs.PVCs noted, but not during longer episode of WCT.  Marland Kitchen CESAREAN SECTION  1994  . CLIPPING OF ATRIAL APPENDAGE N/A 04/13/2019   Procedure: Clipping Of Atrial Appendage using AtriCure 45 MM AtriClip.;  Surgeon: Rexene Alberts, MD;  Location: [place];  Service: Open Heart Surgery;  Laterality: N/A;  . CO2 LASER APPLICATION N/A 06/13/3974   Procedure: CO2 LASER APPLICATION ;  Surgeon: Everitt Amber, MD;  Location: [facility];  Service: Gynecology;  Laterality: N/A;  . COLONOSCOPY,  ESOPHAGOGASTRODUODENOSCOPY (EGD) AND ESOPHAGEAL DILATION  10-04-2013  . LAPAROSCOPIC CHOLECYSTECTOMY  1995  . LASER ABLATION CONDOLAMATA N/A 07/03/2015   Procedure: CO2 LASER OF VULVA ;  Surgeon: Everitt Amber, MD;  Location: [facility];  Service: Gynecology;  Laterality: N/A;  . MITRAL VALVE REPAIR N/A 04/13/2019   Procedure: MITRAL VALVE REPAIR (MVR) using Memo 4D 34 MM Mitral Valve.;  Surgeon: Rexene Alberts, MD;  Location: [place];  Service: Open Heart Surgery;  Laterality: N/A;  . NM MYOVIEW LTD  10/2016   LOW RISK.  No ischemia or infarction.  EF 54%.  Marland Kitchen RIGHT/LEFT HEART CATH AND CORONARY ANGIOGRAPHY N/A 04/10/2019   Procedure: RIGHT/LEFT HEART CATH AND CORONARY ANGIOGRAPHY;  Surgeon: Leonie Man, MD;  Location: [place] [facility];  Service: Cardiovascular;  Laterality: N/A;  . TEE WITHOUT CARDIOVERSION N/A 04/10/2019   Procedure: TRANSESOPHAGEAL ECHOCARDIOGRAM (TEE);  Surgeon: Sueanne Margarita, MD;  Location: [facility] ENDOSCOPY;  Service: Cardiovascular;  Laterality: N/A;  . TEE WITHOUT CARDIOVERSION N/A 04/13/2019   Procedure: TRANSESOPHAGEAL ECHOCARDIOGRAM (  TEE);  Surgeon: Rexene Alberts, MD;  Location: [place];  Service: Open Heart Surgery;  Laterality: N/A;  . TRANSTHORACIC ECHOCARDIOGRAM  11/2010   Normal LV Size & Fxn - EF 55-60%. Mild-Mod MR. Normal DF.  Marland Kitchen TRANSTHORACIC ECHOCARDIOGRAM  08/2016   EF 60 of 65%.  Severe LVH.  Pseudo-normal filling (grade 2 diastolic dysfunction).  MAC with calcified mobile attachments.  Posterior leaflet prolapse.  Mild regurgitation.  Moderate LA dilation.    Current Medications: Current Meds  Medication Sig  . acetaminophen (TYLENOL) 325 MG tablet Take 2 tablets (650 mg total) by mouth every 6 (six) hours as needed (back pain).  Marland Kitchen albuterol (ACCUNEB) 0.63 MG/3ML nebulizer solution Take 1 ampule by nebulization every 6 (six) hours as needed for wheezing.  Marland Kitchen amiodarone (PACERONE) 200 MG tablet Take 1 tablet (200 mg total) by mouth 2 (two) times  daily.  Marland Kitchen aspirin EC 81 MG tablet Take 81 mg by mouth at bedtime. Reported on 07/16/2015  . atorvastatin (LIPITOR) 40 MG tablet Take 40 mg by mouth at bedtime.   . Cyanocobalamin (VITAMIN B-12) 5000 MCG LOZG Take 5,000 mcg by mouth daily.  Marland Kitchen FLUoxetine (PROZAC) 20 MG capsule Take 20 mg by mouth daily at 6 PM. (1700)  . fluticasone (FLONASE) 50 MCG/ACT nasal spray Place 2 sprays into both nostrils at bedtime.   . furosemide (LASIX) 20 MG tablet Take 1 tablet (20 mg total) by mouth daily.  Marland Kitchen levothyroxine (SYNTHROID) 125 MCG tablet Take 125 mcg by mouth daily before breakfast.  . liothyronine (CYTOMEL) 5 MCG tablet Take 5 mcg by mouth daily.  . Melatonin 3 MG TABS Take 1 tablet (3 mg total) by mouth at bedtime as needed (sleep).  . metoprolol tartrate (LOPRESSOR) 25 MG tablet Take 0.5 tablets (12.5 mg total) by mouth 2 (two) times daily.  . Multiple Vitamin (MULTIVITAMIN WITH MINERALS) TABS tablet Take 1 tablet by mouth daily. Multivitamin Essentials  . OLANZapine zydis (ZYPREXA) 5 MG disintegrating tablet Take 1 tablet (5 mg total) by mouth daily.  Marland Kitchen omeprazole (PRILOSEC) 40 MG capsule Take 40 mg by mouth daily.  . potassium chloride SA (KLOR-CON) 20 MEQ tablet Take 1 tablet (20 mEq total) by mouth daily.  Marland Kitchen warfarin (COUMADIN) 4 MG tablet Take 4 mg by mouth daily. 1/2 TABLET 6 DAYS// 1 TABLET 1 DAY  . [DISCONTINUED] ALBUTEROL IN Inhale 1-2 puffs into the lungs every 6 (six) hours as needed.   . [DISCONTINUED] omeprazole (PRILOSEC) 40 MG capsule TAKE 1 CAPSULE(40 MG) BY MOUTH TWICE DAILY (Patient taking differently: Take 40 mg by mouth daily before breakfast. )  . [DISCONTINUED] spironolactone (ALDACTONE) 25 MG tablet Take 0.5 tablets (12.5 mg total) by mouth daily.  . [DISCONTINUED] warfarin (COUMADIN) 4 MG tablet Take 1 tablet (4 mg total) by mouth daily at 6 PM.     Allergies:   Iodine, Iohexol, and Penicillins   Social History   Socioeconomic History  . Marital status: Married    Spouse  name: Rebecca Carr  . Number of children: 1  . Years of education: Not on file  . Highest education level: Not on file  Occupational History  . Occupation: CUSTOMER SERVICE    Employer: [employer]    Comment: Is about to retire  Tobacco Use  . Smoking status: Former Smoker    Packs/day: 1.00    Years: 48.00    Pack years: 48.00    Types: Cigarettes    Quit date: 05/10/2014    Years  since quitting: 5.0  . Smokeless tobacco: Never Used  . Tobacco comment: using nicorrette  Substance and Sexual Activity  . Alcohol use: Yes    Alcohol/week: 2.0 standard drinks    Types: 2 Standard drinks or equivalent per week    Comment: Social - > is now essentially quit  . Drug use: No  . Sexual activity: Not on file  Other Topics Concern  . Not on file  Social History Narrative   No routine exercise. Describes herself as "lazy "   Social Determinants of Health   Financial Resource Strain:   . Difficulty of Paying Living Expenses:   Food Insecurity:   . Worried About Charity fundraiser in the Last Year:   . Arboriculturist in the Last Year:   Transportation Needs:   . Film/video editor (Medical):   Marland Kitchen Lack of Transportation (Non-Medical):   Physical Activity:   . Days of Exercise per Week:   . Minutes of Exercise per Session:   Stress:   . Feeling of Stress :   Social Connections:   . Frequency of Communication with Friends and Family:   . Frequency of Social Gatherings with Friends and Family:   . Attends Religious Services:   . Active Member of Clubs or Organizations:   . Attends Archivist Meetings:   Marland Kitchen Marital Status:      Family History: The patient's family history includes COPD in her brother; Colon cancer in her father and maternal grandfather; Dementia in her brother; Heart attack in her brother and mother; Heart disease in her mother; Pancreatic cancer in her father. There is no history of Rectal cancer, Stomach cancer, or Esophageal cancer.  ROS:   Please  see the history of present illness.     All other systems reviewed and are negative.  EKGs/Labs/Other Studies Reviewed:    The following studies were reviewed today:  MVR 04/13/2019 Procedure:        Mitral Valve Repair             Complex valvuloplasty including triangular resection of posterior leaflet             Artificial Gore-tex neochord placement x6             Suture plications of posterior leaflets clefts x2             Sorin Memo 4D ring annuloplasty (size 41m, catalog #[id number], serial [id number]             Clipping of left atrial appendage (Atricure Pro245 left atrial clip, size 45 mm)  EKG:  EKG is not ordered today.  Heart rate is very regular, she is maintaining sinus rhythm based on physical exam  Recent Labs: 04/16/2019: TSH 5.506 04/24/2019: ALT 49; B Natriuretic Peptide 128.8; Magnesium 2.1 05/10/2019: BUN 22; Creatinine, Ser 1.15; Hemoglobin 9.4; Platelets 314; Potassium 4.8; Sodium 143  Recent Lipid Panel No results found for: CHOL, TRIG, HDL, CHOLHDL, VLDL, LDLCALC, LDLDIRECT  Physical Exam:    VS:  BP 115/79   Pulse 83   Temp (!) 97.2 F (36.2 C)   Ht _0  (1.727 m)   Wt 223 lb 9.6 oz (101.4 kg)   SpO2 93%   BMI 34.00 kg/m     Wt Readings from Last 3 Encounters:  05/10/19 223 lb 12.8 oz (101.5 kg)  05/10/19 223 lb 9.6 oz (101.4 kg)  04/26/19 215 lb 12.8 oz (97.9 kg)  GEN:  Well nourished, well developed in no acute distress HEENT: Normal NECK: No JVD; No carotid bruits LYMPHATICS: No lymphadenopathy CARDIAC: RRR, no murmurs, rubs, gallops RESPIRATORY:  Clear to auscultation without rales, wheezing or rhonchi  ABDOMEN: Soft, non-tender, non-distended MUSCULOSKELETAL:  No edema; No deformity  SKIN: Warm and dry NEUROLOGIC:  Alert and oriented x 3 PSYCHIATRIC:  Normal affect   ASSESSMENT:    1. S/P mitral valve repair   2. Anemia, unspecified type   3. Hypokalemia   4. Medication management   5. Hyperlipidemia LDL goal <100   6.  Hypothyroidism, unspecified type   7. PAF (paroxysmal atrial fibrillation) (HCC)    PLAN:    In order of problems listed above:  1. Mitral valve repair: She is healing quite well.  I do not hear any significant heart murmur on physical exam.  Sternotomy scar well-healed without any drainage or redness  2. Anemia: Obtain CBC  3. Hypokalemia: Obtain basic metabolic panel  4. PAF: Initially she had postop atrial fibrillation, however this recurred multiple times during the hospitalization.  She is maintaining sinus rhythm based on physical exam today.  I recommended continue on the amiodarone therapy however in 2 weeks, she will decrease amiodarone to 200 mg daily.  She will need TSH and liver function test on the next follow-up  5. Hyperlipidemia: On Lipitor  6. Hypothyroidism: On Synthroid   Medication Adjustments/Labs and Tests Ordered: Current medicines are reviewed at length with the patient today.  Concerns regarding medicines are outlined above.  Orders Placed This Encounter  Procedures  . CBC  . Basic metabolic panel  . TSH  . Hepatic function panel   No orders of the defined types were placed in this encounter.   Patient Instructions  Medication Instructions:   Continue current Amiodarone at current dose and starting 05/24/2019 start taking it daily  STOP SPIRONOLACTONE *If you need a refill on your cardiac medications before your next appointment, please call your pharmacy*  Lab Work: Your physician recommends that you return for lab work TODAY:  BMET  CBC  You will need to have lab work done on the day you return for your 2 month follow up:  HEPATIC (LIVER) FUNCTION  TSH  If you have labs (blood work) drawn today and your tests are completely normal, you will receive your results only by: Marland Kitchen MyChart Message (if you have MyChart) OR . A paper copy in the mail If you have any lab test that is abnormal or we need to change your treatment, we will call you  to review the results.  Testing/Procedures: NONE ordered at this time of appointment   Follow-Up: At [facility], you and your health needs are our priority.  As part of our continuing mission to provide you with exceptional heart care, we have created designated Provider Care Teams.  These Care Teams include your primary Cardiologist (physician) and Advanced Practice Providers (APPs -  Physician Assistants and Nurse Practitioners) who all work together to provide you with the care you need, when you need it.   Your next appointment:   2 month(s)  The format for your next appointment:   In Person  Provider:   Glenetta Hew, MD  Other Instructions      Signed, Almyra Deforest, [place]  05/12/2019 11:30 PM    [place] [facility]

## 2019-05-10 NOTE — Patient Instructions (Signed)
Follow up with [place] AS NEEDED  Keep follow up with Dr Wilhemina Cash as scheduled

## 2019-05-10 NOTE — Patient Instructions (Signed)
Medication Instructions:   Continue current Amiodarone at current dose and starting 05/24/2019 start taking it daily  STOP SPIRONOLACTONE *If you need a refill on your cardiac medications before your next appointment, please call your pharmacy*  Lab Work: Your physician recommends that you return for lab work TODAY:  BMET  CBC  You will need to have lab work done on the day you return for your 2 month follow up:  HEPATIC (LIVER) FUNCTION  TSH  If you have labs (blood work) drawn today and your tests are completely normal, you will receive your results only by: Marland Kitchen MyChart Message (if you have MyChart) OR . A paper copy in the mail If you have any lab test that is abnormal or we need to change your treatment, we will call you to review the results.  Testing/Procedures: NONE ordered at this time of appointment   Follow-Up: At [facility], you and your health needs are our priority.  As part of our continuing mission to provide you with exceptional heart care, we have created designated Provider Care Teams.  These Care Teams include your primary Cardiologist (physician) and Advanced Practice Providers (APPs -  Physician Assistants and Nurse Practitioners) who all work together to provide you with the care you need, when you need it.   Your next appointment:   2 month(s)  The format for your next appointment:   In Person  Provider:   Glenetta Hew, MD  Other Instructions

## 2019-05-11 LAB — BASIC METABOLIC PANEL
BUN/Creatinine Ratio: 19 (ref 12–28)
BUN: 22 mg/dL (ref 8–27)
CO2: 24 mmol/L (ref 20–29)
Calcium: 9 mg/dL (ref 8.7–10.3)
Chloride: 103 mmol/L (ref 96–106)
Creatinine, Ser: 1.15 mg/dL — ABNORMAL HIGH (ref 0.57–1.00)
GFR calc Af Amer: 56 mL/min/{1.73_m2} — ABNORMAL LOW (ref >59)
GFR calc non Af Amer: 49 mL/min/{1.73_m2} — ABNORMAL LOW (ref >59)
Glucose: 99 mg/dL (ref 65–99)
Potassium: 4.8 mmol/L (ref 3.5–5.2)
Sodium: 143 mmol/L (ref 134–144)

## 2019-05-11 LAB — CBC
Hematocrit: 30.1 % — ABNORMAL LOW (ref 34.0–46.6)
Hemoglobin: 9.4 g/dL — ABNORMAL LOW (ref 11.1–15.9)
MCH: 28.4 pg (ref 26.6–33.0)
MCHC: 31.2 g/dL — ABNORMAL LOW (ref 31.5–35.7)
MCV: 91 fL (ref 79–97)
Platelets: 314 10*3/uL (ref 150–450)
RBC: 3.31 x10E6/uL — ABNORMAL LOW (ref 3.77–5.28)
RDW: 13 % (ref 11.7–15.4)
WBC: 10 10*3/uL (ref 3.4–10.8)

## 2019-05-11 NOTE — Progress Notes (Signed)
Anemia improving slightly, kidney function and electrolyte ok

## 2019-05-12 ENCOUNTER — Encounter: Payer: Self-pay | Admitting: Physician Assistant

## 2019-05-15 ENCOUNTER — Ambulatory Visit (INDEPENDENT_AMBULATORY_CARE_PROVIDER_SITE_OTHER): Payer: Medicare Other | Admitting: Pharmacist

## 2019-05-15 DIAGNOSIS — I4891 Unspecified atrial fibrillation: Secondary | ICD-10-CM | POA: Diagnosis not present

## 2019-05-15 DIAGNOSIS — Z9889 Other specified postprocedural states: Secondary | ICD-10-CM

## 2019-05-15 DIAGNOSIS — Z7901 Long term (current) use of anticoagulants: Secondary | ICD-10-CM

## 2019-05-15 LAB — POCT INR: INR: 1.6 — AB (ref 2.0–3.0)

## 2019-05-18 ENCOUNTER — Other Ambulatory Visit: Payer: Self-pay | Admitting: Thoracic Surgery (Cardiothoracic Vascular Surgery)

## 2019-05-18 DIAGNOSIS — Z9889 Other specified postprocedural states: Secondary | ICD-10-CM

## 2019-05-21 ENCOUNTER — Ambulatory Visit
Admission: RE | Admit: 2019-05-21 | Discharge: 2019-05-21 | Disposition: A | Discharge status: Home / Self Care | Payer: Medicare Other | Source: Ambulatory Visit | Attending: Thoracic Surgery (Cardiothoracic Vascular Surgery) | Admitting: Thoracic Surgery (Cardiothoracic Vascular Surgery)

## 2019-05-21 ENCOUNTER — Other Ambulatory Visit: Payer: Self-pay | Admitting: Physician Assistant

## 2019-05-21 ENCOUNTER — Ambulatory Visit: Payer: Self-pay | Admitting: Thoracic Surgery (Cardiothoracic Vascular Surgery)

## 2019-05-21 ENCOUNTER — Ambulatory Visit (INDEPENDENT_AMBULATORY_CARE_PROVIDER_SITE_OTHER): Payer: Self-pay | Admitting: Physician Assistant

## 2019-05-21 ENCOUNTER — Other Ambulatory Visit: Payer: Self-pay

## 2019-05-21 VITALS — BP 133/82 | HR 72 | Temp 97.2°F | Resp 20 | Ht 68.0 in | Wt 222.0 lb

## 2019-05-21 DIAGNOSIS — Z952 Presence of prosthetic heart valve: Secondary | ICD-10-CM | POA: Diagnosis not present

## 2019-05-21 DIAGNOSIS — J90 Pleural effusion, not elsewhere classified: Secondary | ICD-10-CM | POA: Diagnosis not present

## 2019-05-21 DIAGNOSIS — Z9889 Other specified postprocedural states: Secondary | ICD-10-CM

## 2019-05-21 DIAGNOSIS — I340 Nonrheumatic mitral (valve) insufficiency: Secondary | ICD-10-CM

## 2019-05-21 MED ORDER — OLANZAPINE 5 MG PO TABS: 5.0000 mg | ORAL_TABLET | Freq: Every day | ORAL | 0 refills | Status: DC

## 2019-05-21 MED ORDER — FUROSEMIDE 20 MG PO TABS: 20.0000 mg | ORAL_TABLET | Freq: Every day | ORAL | 1 refills | Status: DC

## 2019-05-21 MED ORDER — POTASSIUM CHLORIDE ER 10 MEQ PO TBCR: 10.0000 meq | EXTENDED_RELEASE_TABLET | Freq: Every day | ORAL | 1 refills | Status: DC

## 2019-05-21 NOTE — Patient Instructions (Signed)
You may return to driving an automobile as long as you are no longer requiring oral narcotic pain relievers during the daytime.  It would be wise to start driving only short distances during the daylight and gradually increase from there as you feel comfortable.  You may continue to gradually increase your physical activity as tolerated.  Refrain from any heavy lifting or strenuous use of your arms and shoulders until at least 8 weeks from the time of your surgery, and avoid activities that cause increased pain in your chest on the side of your surgical incision.  Otherwise you may continue to increase activities without any particular limitations.  Increase the intensity and duration of physical activity gradually.   Endocarditis is a potentially serious infection of heart valves or inside lining of the heart.  It occurs more commonly in patients with diseased heart valves (such as patient's with aortic or mitral valve disease) and in patients who have undergone heart valve repair or replacement.  Certain surgical and dental procedures may put you at risk, such as dental cleaning, other dental procedures, or any surgery involving the respiratory, urinary, gastrointestinal tract, gallbladder or prostate gland.   To minimize your chances for develooping endocarditis, maintain good oral health and seek prompt medical attention for any infections involving the mouth, teeth, gums, skin or urinary tract.    Always notify your doctor or dentist about your underlying heart valve condition before having any invasive procedures. You will need to take antibiotics before certain procedures, including all routine dental cleanings or other dental procedures.  Your cardiologist or dentist should prescribe these antibiotics for you to be taken ahead of time.

## 2019-05-21 NOTE — Progress Notes (Signed)
HPI: Patient returns for routine postoperative follow-up having undergone on MV Repair by Dr. Roxy Manns on 04/13/2019.   The patient's early postoperative recovery while in the hospital was notable for re-intubation due to hypercarbia.  She developed Atrial Fibrillation which converted to NSR with use of Amiodarone.  She was treated with empiric antibiotics for suspected pneumonia.  Finally, she had issues with post operative delirium.  She was discharged home with continued PT/OT.  Since hospital discharge she has followed up with Dr. Sung Amabile at which time he felt her heart failure was under good control.  Her most recent INR is subtherapeutic at 1.6.  She is scheduled for repeat level on 05/22/2019.  The patient states she is doing okay since surgery.  She states that due to [place] she isn't doing a whole lot.  States she hasn't been walking much.  She continues to have some discomfort along her sternotomy.  She also states she continues to work with PT/OT as she is not comfortable with stairs and is fearful getting up to use the bathroom at night.    Current Outpatient Medications  Medication Sig Dispense Refill  . acetaminophen (TYLENOL) 325 MG tablet Take 2 tablets (650 mg total) by mouth every 6 (six) hours as needed (back pain).    Marland Kitchen albuterol (ACCUNEB) 0.63 MG/3ML nebulizer solution Take 1 ampule by nebulization every 6 (six) hours as needed for wheezing.    Marland Kitchen amiodarone (PACERONE) 200 MG tablet Take 1 tablet (200 mg total) by mouth 2 (two) times daily. 60 tablet 1  . aspirin EC 81 MG tablet Take 81 mg by mouth at bedtime. Reported on 07/16/2015    . atorvastatin (LIPITOR) 40 MG tablet Take 40 mg by mouth at bedtime.     . Cyanocobalamin (VITAMIN B-12) 5000 MCG LOZG Take 5,000 mcg by mouth daily.    Marland Kitchen FLUoxetine (PROZAC) 20 MG capsule Take 20 mg by mouth daily at 6 PM. (1700)    . fluticasone (FLONASE) 50 MCG/ACT nasal spray Place 2 sprays into both nostrils at bedtime.     . furosemide (LASIX) 20 MG  tablet Take 1 tablet (20 mg total) by mouth daily. 30 tablet 1  . levothyroxine (SYNTHROID) 125 MCG tablet Take 125 mcg by mouth daily before breakfast.    . liothyronine (CYTOMEL) 5 MCG tablet Take 5 mcg by mouth daily.    . Melatonin 3 MG TABS Take 1 tablet (3 mg total) by mouth at bedtime as needed (sleep).  0  . metoprolol tartrate (LOPRESSOR) 25 MG tablet Take 0.5 tablets (12.5 mg total) by mouth 2 (two) times daily. 30 tablet 1  . Multiple Vitamin (MULTIVITAMIN WITH MINERALS) TABS tablet Take 1 tablet by mouth daily. Multivitamin Essentials    . OLANZapine zydis (ZYPREXA) 5 MG disintegrating tablet Take 1 tablet (5 mg total) by mouth daily. 30 tablet 1  . omeprazole (PRILOSEC) 40 MG capsule Take 40 mg by mouth daily.    . potassium chloride SA (KLOR-CON) 20 MEQ tablet Take 1 tablet (20 mEq total) by mouth daily. 30 tablet 1  . warfarin (COUMADIN) 4 MG tablet Take 4 mg by mouth daily. 1/2 TABLET 6 DAYS// 1 TABLET 1 DAY     No current facility-administered medications for this visit.    Physical Exam:  BP 133/82 (BP Location: Right Arm, Patient Position: Sitting, Cuff Size: Normal)   Pulse 72   Temp (!) 97.2 F (36.2 C) (Temporal)   Resp 20   Ht 5\' 8"  (1.727  m)   Wt 222 lb (100.7 kg)   SpO2 94% Comment: ra  BMI 33.75 kg/m   Gen: no apparent distress Heart: RRR Lungs: CTA bilaterally Ext: no edema Incisions: healing well  Diagnostic Tests:  CXR: continued atelectasis bilaterally, no significant pleural effusions present  A/P:  1. S/P MV Repair- overall patient is doing okay.. per AHF her heart failure is stable 2. Activity- educated patient on importance of being up and walking around... This can be done as laps inside her home or even her yard.  She was instructed to do this at least 3 times daily and increase as her body allows.  She was referred for cardiac rehab 3. Continue PT/OT as patient requires, for assessment soon 4. Dispo- patient stable, educated on importance  of being up and moving about, cardiac rehab referral place, RX given for lasix, potassium... Zyprexa(will need to f/u with PCP for additional refills) RTC in July with Dr. Geraldo Docker, PA-C [facility] [phone]

## 2019-05-23 ENCOUNTER — Ambulatory Visit (INDEPENDENT_AMBULATORY_CARE_PROVIDER_SITE_OTHER): Payer: Medicare Other | Admitting: Pharmacist Clinician (PhC)/ Clinical Pharmacy Specialist

## 2019-05-23 DIAGNOSIS — Z7901 Long term (current) use of anticoagulants: Secondary | ICD-10-CM | POA: Diagnosis not present

## 2019-05-23 DIAGNOSIS — Z9889 Other specified postprocedural states: Secondary | ICD-10-CM

## 2019-05-23 DIAGNOSIS — I4891 Unspecified atrial fibrillation: Secondary | ICD-10-CM | POA: Diagnosis not present

## 2019-05-23 LAB — POCT INR: INR: 2.3 (ref 2.0–3.0)

## 2019-05-28 DIAGNOSIS — Z9981 Dependence on supplemental oxygen: Secondary | ICD-10-CM | POA: Diagnosis not present

## 2019-05-28 DIAGNOSIS — L308 Other specified dermatitis: Secondary | ICD-10-CM | POA: Diagnosis not present

## 2019-05-28 DIAGNOSIS — Z9181 History of falling: Secondary | ICD-10-CM | POA: Diagnosis not present

## 2019-05-28 DIAGNOSIS — Z48812 Encounter for surgical aftercare following surgery on the circulatory system: Secondary | ICD-10-CM | POA: Diagnosis not present

## 2019-05-28 DIAGNOSIS — Z7901 Long term (current) use of anticoagulants: Secondary | ICD-10-CM | POA: Diagnosis not present

## 2019-05-28 DIAGNOSIS — I471 Supraventricular tachycardia: Secondary | ICD-10-CM | POA: Diagnosis not present

## 2019-05-28 DIAGNOSIS — I2584 Coronary atherosclerosis due to calcified coronary lesion: Secondary | ICD-10-CM | POA: Diagnosis not present

## 2019-05-28 DIAGNOSIS — J9610 Chronic respiratory failure, unspecified whether with hypoxia or hypercapnia: Secondary | ICD-10-CM | POA: Diagnosis not present

## 2019-05-28 DIAGNOSIS — J9601 Acute respiratory failure with hypoxia: Secondary | ICD-10-CM | POA: Diagnosis not present

## 2019-05-28 DIAGNOSIS — I2510 Atherosclerotic heart disease of native coronary artery without angina pectoris: Secondary | ICD-10-CM | POA: Diagnosis not present

## 2019-05-28 DIAGNOSIS — K219 Gastro-esophageal reflux disease without esophagitis: Secondary | ICD-10-CM | POA: Diagnosis not present

## 2019-05-28 DIAGNOSIS — G4733 Obstructive sleep apnea (adult) (pediatric): Secondary | ICD-10-CM | POA: Diagnosis not present

## 2019-05-28 DIAGNOSIS — J449 Chronic obstructive pulmonary disease, unspecified: Secondary | ICD-10-CM | POA: Diagnosis not present

## 2019-05-28 DIAGNOSIS — I493 Ventricular premature depolarization: Secondary | ICD-10-CM | POA: Diagnosis not present

## 2019-05-28 DIAGNOSIS — R002 Palpitations: Secondary | ICD-10-CM | POA: Diagnosis not present

## 2019-05-28 DIAGNOSIS — D071 Carcinoma in situ of vulva: Secondary | ICD-10-CM | POA: Diagnosis not present

## 2019-05-28 DIAGNOSIS — I272 Pulmonary hypertension, unspecified: Secondary | ICD-10-CM | POA: Diagnosis not present

## 2019-05-28 DIAGNOSIS — Z7982 Long term (current) use of aspirin: Secondary | ICD-10-CM | POA: Diagnosis not present

## 2019-05-28 DIAGNOSIS — Z86010 Personal history of colonic polyps: Secondary | ICD-10-CM | POA: Diagnosis not present

## 2019-05-28 DIAGNOSIS — I5033 Acute on chronic diastolic (congestive) heart failure: Secondary | ICD-10-CM | POA: Diagnosis not present

## 2019-05-29 ENCOUNTER — Ambulatory Visit (INDEPENDENT_AMBULATORY_CARE_PROVIDER_SITE_OTHER): Payer: Medicare Other | Admitting: Pharmacist

## 2019-05-29 DIAGNOSIS — Z9889 Other specified postprocedural states: Secondary | ICD-10-CM

## 2019-05-29 DIAGNOSIS — Z7901 Long term (current) use of anticoagulants: Secondary | ICD-10-CM

## 2019-05-29 DIAGNOSIS — I4891 Unspecified atrial fibrillation: Secondary | ICD-10-CM | POA: Diagnosis not present

## 2019-05-29 LAB — POCT INR: INR: 2.3 (ref 2.0–3.0)

## 2019-06-01 DIAGNOSIS — H2513 Age-related nuclear cataract, bilateral: Secondary | ICD-10-CM | POA: Diagnosis not present

## 2019-06-01 DIAGNOSIS — H5213 Myopia, bilateral: Secondary | ICD-10-CM | POA: Diagnosis not present

## 2019-06-05 ENCOUNTER — Ambulatory Visit (INDEPENDENT_AMBULATORY_CARE_PROVIDER_SITE_OTHER): Payer: Medicare Other | Admitting: Pharmacist Clinician (PhC)/ Clinical Pharmacy Specialist

## 2019-06-05 DIAGNOSIS — Z9889 Other specified postprocedural states: Secondary | ICD-10-CM

## 2019-06-05 DIAGNOSIS — Z7901 Long term (current) use of anticoagulants: Secondary | ICD-10-CM

## 2019-06-05 DIAGNOSIS — I4891 Unspecified atrial fibrillation: Secondary | ICD-10-CM | POA: Diagnosis not present

## 2019-06-05 LAB — POCT INR: INR: 1.8 — AB (ref 2.0–3.0)

## 2019-06-06 ENCOUNTER — Telehealth: Payer: Self-pay | Admitting: Cardiology

## 2019-06-06 NOTE — Telephone Encounter (Signed)
Pt called back stated that she is interested in coming off coumadin will route to pharmd

## 2019-06-06 NOTE — Telephone Encounter (Signed)
Patient has questions about whether she can come off of Coumadin. Offered to make sooner appointment for patient to discuss with provider however she has an appointment on 07/12/2019 and states she will discuss with Dr. Ellyn Hack at that time. Patient had a question about resuming a medication for Lupus, advised patient to contact her rheumatologist who originally prescribed the medication. Patient voiced understanding.

## 2019-06-06 NOTE — Telephone Encounter (Signed)
New Message:    Pt said she had open heart surgery on 04-11-19. Pt wants to know what to do about her meiicine, some of it was stopped and some she was put on.

## 2019-06-06 NOTE — Telephone Encounter (Signed)
Patient understand need for Dr Ellyn Hack to complete assessment prior to discontinue any medication.   HH completed and f/u coumadin visit given for 06/22/2019 at 11:15am

## 2019-06-08 ENCOUNTER — Telehealth: Payer: Self-pay | Admitting: Cardiology

## 2019-06-08 NOTE — Telephone Encounter (Signed)
   Pt is calling would like to ask if Dr. Ellyn Hack can send a release to [employer] for her oxygen tank since she is not using it  please advise

## 2019-06-10 NOTE — Telephone Encounter (Signed)
Usually, I would defer oxygen to either PCP or pulmonary medicine.  However, she is not using it and she wants to turn it in, than that is fine.  Glenetta Hew, MD

## 2019-06-11 ENCOUNTER — Other Ambulatory Visit: Payer: Self-pay | Admitting: Physician Assistant

## 2019-06-12 ENCOUNTER — Telehealth: Payer: Self-pay

## 2019-06-12 NOTE — Telephone Encounter (Signed)
Patient contacted the office requesting removal and discharge of medical oxygen at the home.  Patient stated that she never used it, and didn't need it. Faxed request to [facility].

## 2019-06-15 ENCOUNTER — Telehealth: Payer: Self-pay

## 2019-06-15 NOTE — Telephone Encounter (Signed)
Message received from home health PT this morning, reporting that pt fell one day last week. Pt is s/p MV repair on 04/13/19 by Dr. Roxy Manns. TC to pt. She reports tripping over something and says she "landed on all fours" w/ most of her weight applied to R wrist, then she rolled over and hit her head on the floor--"not hard." Denies having any neurological issues. Pt states she didn't believe the fall/injury was significant enough to get evaluated. Her only complaint now is that her R wrist is a little sore. She denies sternal pain/issues. Told pt that Dr. Roxy Manns would be notified of this event. Instructed to report any further problems or concerns. Informed that she would be notified if Dr. Roxy Manns wants to evaluate her or order tests d/t her fall; otherwise, she is scheduled for f/u in July.

## 2019-06-19 ENCOUNTER — Other Ambulatory Visit: Payer: Self-pay | Admitting: Physician Assistant

## 2019-06-19 ENCOUNTER — Telehealth: Payer: Self-pay

## 2019-06-19 NOTE — Telephone Encounter (Signed)
Notified by Dr. Roxy Manns that pt should f/u w/ her PCP regarding her fall that was discussed last week. TC to pt to notify of this. She verbalizes understanding. She questions how long she needs to be on Coumadin d/t needing to have cataract repair soon. Advised to consult her cardiologist or PCP about this.

## 2019-06-21 ENCOUNTER — Telehealth: Payer: Self-pay | Admitting: Cardiology

## 2019-06-21 NOTE — Telephone Encounter (Signed)
PCP office called to find out what her last INR was. Information was provided.

## 2019-06-22 ENCOUNTER — Other Ambulatory Visit: Payer: Self-pay

## 2019-06-22 ENCOUNTER — Ambulatory Visit (INDEPENDENT_AMBULATORY_CARE_PROVIDER_SITE_OTHER): Payer: Medicare Other | Admitting: Pharmacist

## 2019-06-22 DIAGNOSIS — Z7901 Long term (current) use of anticoagulants: Secondary | ICD-10-CM

## 2019-06-22 DIAGNOSIS — Z9889 Other specified postprocedural states: Secondary | ICD-10-CM

## 2019-06-22 DIAGNOSIS — I4891 Unspecified atrial fibrillation: Secondary | ICD-10-CM | POA: Diagnosis not present

## 2019-06-22 LAB — POCT INR: INR: 1.4 — AB (ref 2.0–3.0)

## 2019-06-22 NOTE — Patient Instructions (Addendum)
INCREASE warfarin 4mg  daily except for 1/2 tablet every Sunday, Tuesday and Thursday.  Repeat INR in 2 weeks.  Blood glucose 80; BP 110/82 Take furosemide 20mg  every other day for 3 days, then back to daily

## 2019-06-25 ENCOUNTER — Other Ambulatory Visit: Payer: Self-pay | Admitting: Physician Assistant

## 2019-06-25 ENCOUNTER — Telehealth: Payer: Self-pay

## 2019-06-25 NOTE — Telephone Encounter (Signed)
Pt calls office to report that her iron level is low and to also request a refill of Zyprexa. Instructed her to contact her PCP regarding these issues. She states that her PCP has recently moved and she has not been seen by new provider in this practice; therefore, they are refusing to call in a refill of Zyprexa. Pt is concerned because she is now completely out of this medication and states she knows she is supposed to taper off of it. Reviewed E. Barrett's (PA) notes from 05/21/19, which indicates that pt was instructed that she would need to get Zyprexa refilled by PCP. Discussed this situation w/ Dr. Roxy Manns, who declines refilling Zyprexa. Encouraged pt to contact PCP's office again to explain her current situation/set up appointment as soon as possible.

## 2019-06-26 DIAGNOSIS — Z8659 Personal history of other mental and behavioral disorders: Secondary | ICD-10-CM | POA: Diagnosis not present

## 2019-07-02 ENCOUNTER — Telehealth: Payer: Self-pay | Admitting: Internal Medicine

## 2019-07-02 NOTE — Telephone Encounter (Signed)
LVM for Patient to return my call to schedule the HST pick up

## 2019-07-02 NOTE — Telephone Encounter (Signed)
Looks like this was once you put back in the folder maybe.

## 2019-07-03 NOTE — Telephone Encounter (Signed)
Patient has been schedule to pick up HST machine on 06/03 but was also  Wanting to know if she needed to keep appt with Dr. Annamaria Boots on 05/27. I sent message to Dr. Annamaria Boots and Horris Latino to answer this question

## 2019-07-03 NOTE — Telephone Encounter (Signed)
Called and spoke with patient regarding message about appt on 5/27. Advised patient that we will need to move up her appt out 30 days  because she is picking up her HST machine on 6/3. Patient stated she would need to contact our office back to reschdule her appt.

## 2019-07-05 ENCOUNTER — Ambulatory Visit: Payer: Medicare Other | Admitting: Internal Medicine

## 2019-07-06 ENCOUNTER — Telehealth: Payer: Self-pay | Admitting: Cardiology

## 2019-07-06 NOTE — Telephone Encounter (Signed)
Patient reports no active bleeding, only dry blood in the morning.  Reiterated recommendations given by nurse and encouraged to call back if bleeding noted.

## 2019-07-06 NOTE — Telephone Encounter (Signed)
Spoke with patient and she had a "hunk of dry blood" come out of her nose this morning, not bleeding but a little blood spot on tissue. Advised patient to try using normal saline and coating inside of nose with neosporin and will forward to Pharm D for review

## 2019-07-12 ENCOUNTER — Ambulatory Visit (INDEPENDENT_AMBULATORY_CARE_PROVIDER_SITE_OTHER): Payer: Medicare Other | Admitting: Pharmacist

## 2019-07-12 ENCOUNTER — Encounter: Payer: Self-pay | Admitting: Cardiology

## 2019-07-12 ENCOUNTER — Other Ambulatory Visit: Payer: Self-pay

## 2019-07-12 ENCOUNTER — Ambulatory Visit: Payer: Medicare Other

## 2019-07-12 ENCOUNTER — Ambulatory Visit (INDEPENDENT_AMBULATORY_CARE_PROVIDER_SITE_OTHER): Payer: Medicare Other | Admitting: Cardiology

## 2019-07-12 VITALS — BP 110/72 | HR 69 | Ht 68.0 in | Wt 225.0 lb

## 2019-07-12 DIAGNOSIS — I340 Nonrheumatic mitral (valve) insufficiency: Secondary | ICD-10-CM

## 2019-07-12 DIAGNOSIS — I5033 Acute on chronic diastolic (congestive) heart failure: Secondary | ICD-10-CM

## 2019-07-12 DIAGNOSIS — I341 Nonrheumatic mitral (valve) prolapse: Secondary | ICD-10-CM

## 2019-07-12 DIAGNOSIS — Z9889 Other specified postprocedural states: Secondary | ICD-10-CM

## 2019-07-12 DIAGNOSIS — R0600 Dyspnea, unspecified: Secondary | ICD-10-CM

## 2019-07-12 DIAGNOSIS — I2510 Atherosclerotic heart disease of native coronary artery without angina pectoris: Secondary | ICD-10-CM

## 2019-07-12 DIAGNOSIS — Z7901 Long term (current) use of anticoagulants: Secondary | ICD-10-CM

## 2019-07-12 DIAGNOSIS — I4891 Unspecified atrial fibrillation: Secondary | ICD-10-CM

## 2019-07-12 DIAGNOSIS — I2584 Coronary atherosclerosis due to calcified coronary lesion: Secondary | ICD-10-CM

## 2019-07-12 DIAGNOSIS — R002 Palpitations: Secondary | ICD-10-CM | POA: Diagnosis not present

## 2019-07-12 DIAGNOSIS — R0609 Other forms of dyspnea: Secondary | ICD-10-CM

## 2019-07-12 LAB — POCT INR: INR: 1.9 — AB (ref 2.0–3.0)

## 2019-07-12 MED ORDER — FUROSEMIDE 20 MG PO TABS: 20.0000 mg | ORAL_TABLET | Freq: Every day | ORAL | 3 refills | Status: DC | PRN

## 2019-07-12 NOTE — Patient Instructions (Signed)
Medication Instructions:    FOR THE NEXT 2 WEEKS  -TAKE LASIX  3 DAYS A WEEK  , THEN GO TO TAKE LASIX AS NEEDED DAILY  *If you need a refill on your cardiac medications before your next appointment, please call your pharmacy*   Lab Work: NOT NEEDED If you have labs (blood work) drawn today and your tests are completely normal, you will receive your results only by: Marland Kitchen MyChart Message (if you have MyChart) OR . A paper copy in the mail If you have any lab test that is abnormal or we need to change your treatment, we will call you to review the results.   Testing/Procedures: NOT NEEDED   Follow-Up: At [facility], you and your health needs are our priority.  As part of our continuing mission to provide you with exceptional heart care, we have created designated Provider Care Teams.  These Care Teams include your primary Cardiologist (physician) and Advanced Practice Providers (APPs -  Physician Assistants and Nurse Practitioners) who all work together to provide you with the care you need, when you need it.    Your next appointment:   3 month(s)  The format for your next appointment:   In Person  Provider:   Glenetta Hew, MD

## 2019-07-12 NOTE — Progress Notes (Signed)
Primary Care Provider: Garth Bigness (Inactive) Cardiologist: Glenetta Hew, MD Electrophysiologist: None  Clinic Note: Chief Complaint  Patient presents with  . Follow-up    1 month  . Cardiac Valve Problem    Status post MVR    HPI:    Rebecca Carr - "Baker Janus"  is a 69 y.o. female with a PMH below who presents today for her initial postop visit with me after her mitral valve surgery.  She has a history of antiphospholipid syndrome, COPD GERD Hashimoto's HLD and OSA with history of rapidly progressing SEVERE MR.    Recent Hospitalizations:  s/p MITRAL VALVE REPAIR by Dr. Roxy Manns March 5,2021..    April 10, 2019: Right and Left Heart Cath --> severely elevated PA pressures with PCWP of 33 and a V wave of 62 mmHg.  She was actually admitted under the heart failure service and was seen by Dr. Roxy Manns and for urgent mitral valve repair.   04/13/2019-mitral valve repair using Sorin memo 4D ring annuloplasty size 34 mm, artificial Gore-Tex neocord replacement x6 and clipping of the left atrial appendage. ->  After initial extubation she was reintubated for recurrent respiratory failure.  She was treated with inhaled nitric oxide and IV Lasix for volume overload.  She then was put on amiodarone for atrial fibrillation now transition to oral loading dose.  Started on warfarin.  Lasix 20 mg daily.  Rebecca Carr was last seen on 05/10/2019 by Dr. Haroldine Laws for follow-up.  Indicating that she was slowly getting better ADLs but no significant edema or palpitations.  No PND orthopnea.  Chest is sore from postop.  Discussed SBE prophylaxis.  Amiodarone 200 daily.  Plan to stop once seen by Dr. Roxy Manns  Reviewed  CV studies:    The following studies were reviewed today: (if available, images/films reviewed: From Epic Chart or Care Everywhere) . March 12, 2019-2D Echo: EF 60-65% with significant posterior mitral valve leaflet prolapse with moderate severe MR.   o Myoview showed no  ischemia.   . April 10, 2019-TEE: EF 60 to 65%.  No R WMA.  Mildly reduced RV function.  Severe LA dilation.  Severe RA dilation.  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  Severe MR with eccentric anterior directed jet.  PV flow show systolic reversal.  Mild to moderate TR.  Mild aortic valve sclerosis but no stenosis. . April 10, 2019-RLCP: Angiographically normal coronary arteries.  Moderate-severe pulmonary hypertension with severe MR.  PAP-mean 67/27 mmHg - 44 mmHg, PCWP 26/62 mmHg-(V wave 62 mmHg).  RVP-EDP 66/14 mmHg - 22 mmHg, RAP mean 20 mmHg.  AoP-MAP 96/60 mmHg - 76 mmHg.  Fick CO-CI: 5.24-2.37  April 13, 2019 -  MVR (Dr. Roxy Manns)  TEE: EF > 65% Preop-myxomatous MV with severe prolapse -flail P2 leaflet with apparent ruptured chordae..  Severe MR-anterior.  Pulm V flow reversal.--> Post-OP: EF 40 to 50%.  No R WMA.  Mild LV dilation.  MV now repaired.  Trivial to no residual MR.  No MS (peak gradient 7 mmHg, mean to mmHg).  Annular ring intact with no leak.  Trivial TR.  Interval History:   [name] returns here today for her postop follow-up with me.  Her amiodarone was recently discontinued by Dr. Roxy Manns.  She feels remarkably better.  Breathing has notably improved.  Less less swelling.  Able to sleep without significant orthopnea or PND.  She has been walking a whole lot more of late, still feels  her heart rate go up little bit, but is able to do this without any significant dyspnea besides what would be expected due to deconditioning.  Easily able to do ADLs and started to be much more active with exercise.  Besides having some mild right-sided chest discomfort, surgery, she actually is doing amazingly well.  No palpitations.  Significant improved dyspnea.  No PND orthopnea.  Edema essentially gone.  CV Review of Symptoms (Summary) Cardiovascular ROS: positive for - Still has some mild musculoskeletal chest pains from surgery;  edema seem to be well controlled.. negative  for - dyspnea on exertion, irregular heartbeat, orthopnea, palpitations, paroxysmal nocturnal dyspnea, rapid heart rate, shortness of breath or Syncope/near syncope, TIA/amaurosis fugax, claudication  The patient does not have symptoms concerning for COVID-19 infection (fever, chills, cough, or new shortness of breath).  The patient is practicing social distancing & Masking.    REVIEWED OF SYSTEMS   Review of Systems  Constitutional: Negative for malaise/fatigue (Slowly regaining energy level.) and weight loss.  HENT: Negative for congestion and nosebleeds.   Respiratory: Negative for cough, shortness of breath and wheezing.   Cardiovascular: Positive for leg swelling (Notably improved ).  Gastrointestinal: Negative for abdominal pain, blood in stool and melena.  Genitourinary: Negative for hematuria.  Musculoskeletal: Negative for joint pain.  Neurological: Negative for dizziness, focal weakness, weakness and headaches.  Psychiatric/Behavioral: Negative.    I have reviewed and (if needed) personally updated the patient's problem list, medications, allergies, past medical and surgical history, social and family history.   PAST MEDICAL HISTORY   Past Medical History:  Diagnosis Date  . Antiphospholipid syndrome ([place])   . Anxiety   . Arthritis   . Clotting disorder ([place])   . COPD (chronic obstructive pulmonary disease) ([place])   . DES exposure in utero   . Diverticulosis of colon   . Dizziness   . Environmental and seasonal allergies   . GERD (gastroesophageal reflux disease)   . 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  Marland Kitchen Hashimoto's thyroiditis   . Heart palpitations   . Hiatal hernia   . History of adenomatous polyp of colon    hyperplastic 2015  and tubular adenoma 2005  . History of esophageal dilatation    of stricture  . History of esophagitis     LA class , grade B and Gastritis  . History of pituitary tumor    dx 1990's - asymptomatic /  per pt last scan 2005 approx.  resolved   . Hyperlipidemia   . Hypothyroidism   . Leg pain, bilateral   . Mild intermittent asthma   . MTHFR mutation ([place])   . OSA (obstructive sleep apnea)    non-compliant cpap  . Prediabetes   . 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 SUI (stress urinary incontinence, female)   . VIN III (vulvar intraepithelial neoplasia III)   . Wears glasses     PAST SURGICAL HISTORY   Past Surgical History:  Procedure Laterality Date  . BUBBLE STUDY  04/10/2019   Procedure: BUBBLE STUDY;  Surgeon: Sueanne Margarita, MD;  Location: [facility] ENDOSCOPY;  Service: Cardiovascular;;  . CARDIAC EVENT MONITOR  July-August 2018   Normal sinus rhythm with sinus tachycardia.  Average heart  rate 91 bpm.  Occasional PVCs.  One 12 and another 6 beat run of wide-complex tachycardia.  2 morphology PVCs.PVCs noted, but not during longer episode of WCT.  Marland Kitchen CESAREAN SECTION  1994  . CLIPPING OF ATRIAL APPENDAGE N/A 04/13/2019   Procedure: Clipping Of Atrial Appendage using AtriCure 45 MM AtriClip.;  Surgeon: Rexene Alberts, MD;  Location: [place];  Service: Open Heart Surgery;  Laterality: N/A;  . CO2 LASER APPLICATION N/A 9/51/8841   Procedure: CO2 LASER APPLICATION ;  Surgeon: Everitt Amber, MD;  Location: [facility];  Service: Gynecology;  Laterality: N/A;  . COLONOSCOPY, ESOPHAGOGASTRODUODENOSCOPY (EGD) AND ESOPHAGEAL DILATION  10-04-2013  . LAPAROSCOPIC CHOLECYSTECTOMY  1995  . LASER ABLATION CONDOLAMATA N/A 07/03/2015   Procedure: CO2 LASER OF VULVA ;  Surgeon: Everitt Amber, MD;  Location: [facility];  Service: Gynecology;  Laterality: N/A;  . MITRAL VALVE REPAIR N/A 04/13/2019   Procedure: MITRAL VALVE REPAIR (MVR) using  Memo 4D 34 MM Mitral Valve.;  Surgeon: Rexene Alberts, MD;  Location: [place];  Service: Open Heart Surgery;  Laterality: N/A;  . NM MYOVIEW LTD  10/2016   LOW RISK.  No ischemia or infarction.  EF 54%.  Marland Kitchen RIGHT/LEFT HEART CATH AND CORONARY ANGIOGRAPHY N/A 04/10/2019   Procedure: RIGHT/LEFT HEART CATH AND CORONARY ANGIOGRAPHY;  Surgeon: Leonie Man, MD:: Angiographically normal coronary arteries.  Moderate-severe pulmonary hypertension with severe MR.  PAP-mean 67/27 mmHg - 44 mmHg, PCWP 26/62 mmHg-(V wave 62 mmHg).  RVP-EDP 66/14 mmHg - 22 mmHg, RAP mean 20 mmHg.  AoP-MAP 96/60 mmHg - 76 mmHg.  Fick CO-CI: 5.24-2.37. --> admitted for diuresis & MVR.  . TEE WITHOUT CARDIOVERSION N/A 04/10/2019   Procedure: TRANSESOPHAGEAL ECHOCARDIOGRAM (TEE);  Surgeon: Sueanne Margarita, MD;  Location: [facility] ENDOSCOPY::  EF 60 to 65%.  No R WMA.  Mildly reduced RV function.  Severe LA dilation.  Severe RA dilation.  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  Severe MR with eccentric anterior directed jet.  PV flow show systolic reversal.  Mild to moderate TR.  Mild AoV sclerosis w/o AS  . TEE WITHOUT CARDIOVERSION N/A 04/13/2019   Procedure: TRANSESOPHAGEAL ECHOCARDIOGRAM (TEE);  Surgeon: Rexene Alberts, MD;  Location: [facility] OR: EF > 65% Preop-myxomatous MV with severe prolapse -flail P2 leaflet w/ ruptured chordae.  Severe MR-anterior.  Pulm V flow reversal.--> Post-OP: EF 40-50%.  No R WMA.  Mild LV dilation.  MV now repaired.  Trivial/no MR.  No MS (peak 7 mmHg, mean 5mHg).  Annular ring intact - no leak  . TRANSTHORACIC ECHOCARDIOGRAM  03/2019   EF 60-65% with significant posterior mitral valve leaflet prolapse with moderate severe MR.    . TRANSTHORACIC ECHOCARDIOGRAM  08/2016   EF 60 of 65%.  Severe LVH.  Pseudo-normal filling (grade 2 diastolic dysfunction).  MAC with calcified mobile attachments.  Posterior leaflet prolapse.  Mild regurgitation.  Moderate LA dilation.    MEDICATIONS/ALLERGIES    Current Meds  Medication Sig  . acetaminophen (TYLENOL) 325 MG tablet Take 2 tablets (650 mg total) by mouth every 6 (six) hours as needed (back pain).  .Marland Kitchenalbuterol (ACCUNEB) 0.63 MG/3ML nebulizer solution Take 1 ampule by nebulization every 6 (six) hours as needed for wheezing.  .Marland Kitchenaspirin EC 81 MG tablet Take 81 mg by mouth at bedtime. Reported on 07/16/2015  . atorvastatin (LIPITOR) 40 MG tablet Take 40 mg by mouth at bedtime.   . Cyanocobalamin (VITAMIN B-12)  5000 MCG LOZG Take 5,000 mcg by mouth daily.  Marland Kitchen FLUoxetine (PROZAC) 20 MG capsule Take 20 mg by mouth daily at 6 PM. (1700)  . fluticasone (FLONASE) 50 MCG/ACT nasal spray Place 2 sprays into both nostrils at bedtime.   . furosemide (LASIX) 20 MG tablet Take 1 tablet (20 mg total) by mouth daily as needed.  Marland Kitchen levothyroxine (SYNTHROID) 125 MCG tablet Take 125 mcg by mouth daily before breakfast.  . liothyronine (CYTOMEL) 5 MCG tablet Take 5 mcg by mouth daily.  . metoprolol tartrate (LOPRESSOR) 25 MG tablet Take 0.5 tablets (12.5 mg total) by mouth 2 (two) times daily.  . Multiple Vitamin (MULTIVITAMIN WITH MINERALS) TABS tablet Take 1 tablet by mouth daily. Multivitamin Essentials  . OLANZapine (ZYPREXA) 2.5 MG tablet Take 2.5 mg by mouth daily.  Marland Kitchen omeprazole (PRILOSEC) 40 MG capsule Take 40 mg by mouth in the morning and at bedtime.   . potassium chloride (KLOR-CON) 10 MEQ tablet TAKE 1 TABLET(10 MEQ) BY MOUTH DAILY  . warfarin (COUMADIN) 4 MG tablet Take 4 mg by mouth daily. 1/2 TABLET 6 DAYS// 1 TABLET 1 DAY ..OR AS DIRECTED  . [DISCONTINUED] furosemide (LASIX) 20 MG tablet TAKE 1 TABLET(20 MG) BY MOUTH DAILY    Allergies  Allergen Reactions  . Iodine Anaphylaxis  . Iohexol Anaphylaxis     Desc: Patient states she is allergic to iodine and had a "code blue" incident after an injection for some type of imaging study.   . Penicillins Rash    PCN IN LARGE DOSES. Did it involve swelling of the face/tongue/throat, SOB, or low BP?  No Did it involve sudden or severe rash/hives, skin peeling, or any reaction on the inside of your mouth or nose? No Did you need to seek medical attention at a hospital or doctor's office? No When did it last happen? More than 10 years ago If all above answers are "NO", may proceed with cephalosporin use.     SOCIAL HISTORY/FAMILY HISTORY   Reviewed in Epic:  Pertinent findings: Nothing new.  OBJCTIVE -PE, EKG, labs   Wt Readings from Last 3 Encounters:  07/12/19 225 lb (102.1 kg)  05/21/19 222 lb (100.7 kg)  05/10/19 223 lb 12.8 oz (101.5 kg)    Physical Exam: BP 110/72   Pulse 69   Ht _0  (1.727 m)   Wt 225 lb (102.1 kg)   BMI 34.21 kg/m  Physical Exam  Constitutional: She is oriented to person, place, and time. She appears well-developed and well-nourished.  Mildly obese.  But well appearing postop.  HENT:  Head: Normocephalic and atraumatic.  Neck: No hepatojugular reflux and no JVD present. Carotid bruit is not present.  Cardiovascular: Normal rate, regular rhythm, normal heart sounds and intact distal pulses.  No extrasystoles are present. PMI is not displaced. Exam reveals no gallop and no friction rub.  No murmur heard. Sternal wound clean dry and intact.  Well-healing.  Pulmonary/Chest: Effort normal and breath sounds normal. No respiratory distress. She has no wheezes. She has no rales.  Musculoskeletal:        General: Edema (Trivial) present. Normal range of motion.     Cervical back: Normal range of motion and neck supple.  Neurological: She is alert and oriented to person, place, and time.  Psychiatric: She has a normal mood and affect. Her behavior is normal. Judgment and thought content normal.  Vitals reviewed.    Adult ECG Report  Rate: 69;  Rhythm: normal sinus rhythm  and Normal axis, normal durations.;   Narrative Interpretation: Normal EKG  Recent Labs:    No results found for: CHOL, HDL, LDLCALC, LDLDIRECT, TRIG, CHOLHDL Lab Results   Component Value Date   CREATININE 1.15 (H) 05/10/2019   BUN 22 05/10/2019   NA 143 05/10/2019   K 4.8 05/10/2019   CL 103 05/10/2019   CO2 24 05/10/2019   Lab Results  Component Value Date   TSH 5.506 (H) 04/16/2019    ASSESSMENT/PLAN    Problem List Items Addressed This Visit    Severe mitral regurgitation (Chronic)    Severe MR very well leaflet of myxomatous mitral valve.   S/p MVR.  Minimal if anything from a murmur heard on exam at this point.  JVD resolved.  Significant elevated pulmonary pressures were probably all related to the MR.        Relevant Medications   furosemide (LASIX) 20 MG tablet   Acute on chronic diastolic heart failure (HCC) (Chronic)    Clearly related to flail leaflet of the mitral valve now status post repair.  She was acutely ill requiring hospitalization following her cardiac catheterization.  She was actually progressively diuresed and treated with inodilators to tune her up prior to her mitral valve repair.  Now barely requiring any diuretic. --> We will have her reduce Lasix to 3 days a week for the next 2 weeks and then change to as needed.      Relevant Medications   furosemide (LASIX) 20 MG tablet   Palpitations (Chronic)    No longer having the spells on current dose of metoprolol post mitral repair.  Really only on minimal dose of beta-blocker now.      Coronary artery calcification of native artery (Chronic)    Nonischemic Myoview, confirmed by relatively normal coronaries seen cath.  For her ongoing risk factors, continue aspirin, beta-blocker and statin.      Relevant Medications   furosemide (LASIX) 20 MG tablet   Mitral valve prolapse (Chronic)    Developed severe MR with flail leaflet of P2 leaflet secondary to chordae rupture --> urgent MVR  F/u Echo in 6-12 months to reassess.      Relevant Medications   furosemide (LASIX) 20 MG tablet   Exertional dyspnea    At this point able to seem that her exertional  dyspnea was clearly related to the mitral valve disease.  Edema seems relatively well controlled.  Her severely elevated pulmonary pressures are all resolved.  We will change furosemide to 3 days a week for the next several weeks and then to as needed.      Relevant Orders   EKG 12-Lead (Completed)   S/P mitral valve repair - Primary    She had very complex mitral valve repair by Dr. Roxy Manns -> recently seen for follow-up.  Had some postop A. fib, now off of amiodarone.  We will probably recheck echocardiogram in about 6 to 12 months to reevaluate.      Relevant Orders   EKG 12-Lead (Completed)   Atrial fibrillation ([place])    She had A. fib around the time of her mitral surgery.  Need to determine if there is any recurrence.  For now she is already on warfarin anticoagulation postop.  Is on low-dose metoprolol.  Follow-up for symptoms of recurrent A. fib.  May not need long-term warfarin.      Relevant Medications   furosemide (LASIX) 20 MG tablet   Other Relevant Orders   EKG 12-Lead (Completed)  Long term (current) use of anticoagulants    Now she is currently on warfarin. Would probably hold aspirin while she is on warfarin postop.  Need to determine if there is any recurrence of her postop A. fib which would warrant longer-term treatment with warfarin.          COVID-19 Education: The signs and symptoms of COVID-19 were discussed with the patient and how to seek care for testing (follow up with PCP or arrange E-visit).   The importance of social distancing and COVID-19 vaccination was discussed today.  I spent a total of 30mnutes with the patient. >  50% of the time was spent in direct patient consultation.  Additional time spent with chart review  / charting (studies, outside notes, etc): 16 Total Time: 38 min   Current medicines are reviewed at length with the patient today.  (+/- concerns) none  Notice: This dictation was prepared with Dragon dictation along with  smaller phrase technology. Any transcriptional errors that result from this process are unintentional and may not be corrected upon review.  Patient Instructions / Medication Changes & Studies & Tests Ordered   Patient Instructions  Medication Instructions:    FOR THE NEXT 2 WEEKS  -TAKE LASIX  3 DAYS A WEEK  , THEN GO TO TAKE LASIX AS NEEDED DAILY  *If you need a refill on your cardiac medications before your next appointment, please call your pharmacy*   Lab Work: NOT NEEDED If you have labs (blood work) drawn today and your tests are completely normal, you will receive your results only by: .Marland KitchenMyChart Message (if you have MyChart) OR . A paper copy in the mail If you have any lab test that is abnormal or we need to change your treatment, we will call you to review the results.   Testing/Procedures: NOT NEEDED   Follow-Up: [facility] and your health needs are our priority.  As part of our continuing mission to provide you with exceptional heart care, we have created designated Provider Care Teams.  These Care Teams include your primary Cardiologist (physician) and Advanced Practice Providers (APPs -  Physician Assistants and Nurse Practitioners) who all work together to provide you with the care you need, when you need it.    Your next appointment:   3 month(s)  The format for your next appointment:   In Person  Provider:   DGlenetta Hew MD       Studies Ordered:   Orders Placed This Encounter  Procedures  . EKG 12-Lead     DGlenetta Hew M.D., M.S. Interventional Cardiologist   Pager # [phone] # [phone] [address] [place] [address]  Thank you for choosing [facility]!

## 2019-07-14 ENCOUNTER — Other Ambulatory Visit: Payer: Self-pay | Admitting: Physician Assistant

## 2019-07-17 ENCOUNTER — Encounter: Payer: Self-pay | Admitting: Cardiology

## 2019-07-17 NOTE — Assessment & Plan Note (Signed)
Severe MR very well leaflet of myxomatous mitral valve.   S/p MVR.  Minimal if anything from a murmur heard on exam at this point.  JVD resolved.  Significant elevated pulmonary pressures were probably all related to the MR.

## 2019-07-17 NOTE — Assessment & Plan Note (Signed)
Clearly related to flail leaflet of the mitral valve now status post repair.  She was acutely ill requiring hospitalization following her cardiac catheterization.  She was actually progressively diuresed and treated with inodilators to tune her up prior to her mitral valve repair.  Now barely requiring any diuretic. --> We will have her reduce Lasix to 3 days a week for the next 2 weeks and then change to as needed.

## 2019-07-17 NOTE — Assessment & Plan Note (Signed)
Developed severe MR with flail leaflet of P2 leaflet secondary to chordae rupture --> urgent MVR  F/u Echo in 6-12 months to reassess.

## 2019-07-17 NOTE — Assessment & Plan Note (Signed)
Now she is currently on warfarin. Would probably hold aspirin while she is on warfarin postop.  Need to determine if there is any recurrence of her postop A. fib which would warrant longer-term treatment with warfarin.

## 2019-07-17 NOTE — Assessment & Plan Note (Signed)
She had very complex mitral valve repair by Dr. Roxy Manns -> recently seen for follow-up.  Had some postop A. fib, now off of amiodarone.  We will probably recheck echocardiogram in about 6 to 12 months to reevaluate.

## 2019-07-17 NOTE — Assessment & Plan Note (Signed)
She had A. fib around the time of her mitral surgery.  Need to determine if there is any recurrence.  For now she is already on warfarin anticoagulation postop.  Is on low-dose metoprolol.  Follow-up for symptoms of recurrent A. fib.  May not need long-term warfarin.

## 2019-07-17 NOTE — Assessment & Plan Note (Signed)
Nonischemic Myoview, confirmed by relatively normal coronaries seen cath.  For her ongoing risk factors, continue aspirin, beta-blocker and statin.

## 2019-07-17 NOTE — Assessment & Plan Note (Signed)
No longer having the spells on current dose of metoprolol post mitral repair.  Really only on minimal dose of beta-blocker now.

## 2019-07-17 NOTE — Assessment & Plan Note (Signed)
At this point able to seem that her exertional dyspnea was clearly related to the mitral valve disease.  Edema seems relatively well controlled.  Her severely elevated pulmonary pressures are all resolved.  We will change furosemide to 3 days a week for the next several weeks and then to as needed.

## 2019-07-19 ENCOUNTER — Other Ambulatory Visit: Payer: Self-pay | Admitting: Cardiology

## 2019-07-19 DIAGNOSIS — R760 Raised antibody titer: Secondary | ICD-10-CM | POA: Diagnosis not present

## 2019-07-19 DIAGNOSIS — Z79899 Other long term (current) drug therapy: Secondary | ICD-10-CM | POA: Diagnosis not present

## 2019-07-19 DIAGNOSIS — M359 Systemic involvement of connective tissue, unspecified: Secondary | ICD-10-CM | POA: Diagnosis not present

## 2019-07-19 MED ORDER — WARFARIN SODIUM 4 MG PO TABS: ORAL_TABLET | ORAL | 1 refills | Status: DC

## 2019-07-19 NOTE — Telephone Encounter (Signed)
*  STAT* If patient is at the pharmacy, call can be transferred to refill team.   1. Which medications need to be refilled? (please list name of each medication and dose if known)  warfarin (COUMADIN) 4 MG tablet  2. Which pharmacy/location (including street and city if local pharmacy) is medication to be sent to? [place], [address] [place]  3. Do they need a 30 day or 90 day supply? 90 with refills   Pt is out of medication

## 2019-07-20 ENCOUNTER — Telehealth: Payer: Self-pay | Admitting: Cardiology

## 2019-07-20 NOTE — Telephone Encounter (Signed)
New Message   Pt is calling to check on medication refill

## 2019-07-23 ENCOUNTER — Telehealth: Payer: Self-pay | Admitting: Internal Medicine

## 2019-07-23 ENCOUNTER — Encounter: Payer: Self-pay | Admitting: Internal Medicine

## 2019-07-23 NOTE — Telephone Encounter (Signed)
Dr. Annamaria Boots you saw this patient on 04/03/19 and order the home sleep test.  She was in the hospital the 1st time I tried to call her 3/18. Patient had open heart surgery and wanted to see her doctor and then would call me back 4/19. Patient wanted to see what her bills were going to be from being in the hospital then would call me back 4/20.  I left a message for her to call me again on 07/02/2019.  She did finally call me back and we had her scheduled to pick up HST machine on 07/12/19 and she called and CXL your return appt and the appt to pick up HST machine. I have sent her a letter per protocol

## 2019-07-26 ENCOUNTER — Other Ambulatory Visit: Payer: Self-pay | Admitting: Gastroenterology

## 2019-07-30 NOTE — Telephone Encounter (Signed)
Pt calling to get her hst scheduled. Pt can be reached at [phone]

## 2019-07-31 DIAGNOSIS — H2513 Age-related nuclear cataract, bilateral: Secondary | ICD-10-CM | POA: Diagnosis not present

## 2019-07-31 NOTE — Telephone Encounter (Signed)
I called and spoke with Rebecca Carr and she has now been rescheduled to pick up HST machine on 08/08/2019 @ 11:00am

## 2019-08-01 ENCOUNTER — Other Ambulatory Visit: Payer: Self-pay

## 2019-08-01 ENCOUNTER — Ambulatory Visit (INDEPENDENT_AMBULATORY_CARE_PROVIDER_SITE_OTHER): Payer: Medicare Other | Admitting: Pharmacist

## 2019-08-01 DIAGNOSIS — Z954 Presence of other heart-valve replacement: Secondary | ICD-10-CM | POA: Diagnosis not present

## 2019-08-01 DIAGNOSIS — Z9889 Other specified postprocedural states: Secondary | ICD-10-CM | POA: Diagnosis not present

## 2019-08-01 DIAGNOSIS — Z7901 Long term (current) use of anticoagulants: Secondary | ICD-10-CM

## 2019-08-01 LAB — POCT INR: INR: 2.2 (ref 2.0–3.0)

## 2019-08-03 DIAGNOSIS — L720 Epidermal cyst: Secondary | ICD-10-CM | POA: Diagnosis not present

## 2019-08-03 DIAGNOSIS — D1801 Hemangioma of skin and subcutaneous tissue: Secondary | ICD-10-CM | POA: Diagnosis not present

## 2019-08-03 DIAGNOSIS — L570 Actinic keratosis: Secondary | ICD-10-CM | POA: Diagnosis not present

## 2019-08-16 DIAGNOSIS — J0100 Acute maxillary sinusitis, unspecified: Secondary | ICD-10-CM | POA: Diagnosis not present

## 2019-08-19 ENCOUNTER — Other Ambulatory Visit: Payer: Self-pay | Admitting: Cardiology

## 2019-08-20 ENCOUNTER — Encounter: Payer: Self-pay | Admitting: Thoracic Surgery (Cardiothoracic Vascular Surgery)

## 2019-08-20 ENCOUNTER — Ambulatory Visit (INDEPENDENT_AMBULATORY_CARE_PROVIDER_SITE_OTHER): Payer: Self-pay | Admitting: Thoracic Surgery (Cardiothoracic Vascular Surgery)

## 2019-08-20 ENCOUNTER — Other Ambulatory Visit: Payer: Self-pay

## 2019-08-20 ENCOUNTER — Other Ambulatory Visit: Payer: Self-pay | Admitting: Thoracic Surgery (Cardiothoracic Vascular Surgery)

## 2019-08-20 VITALS — BP 113/73 | HR 74 | Temp 97.7°F | Resp 20 | Ht 68.0 in | Wt 227.0 lb

## 2019-08-20 DIAGNOSIS — I340 Nonrheumatic mitral (valve) insufficiency: Secondary | ICD-10-CM

## 2019-08-20 DIAGNOSIS — Z9889 Other specified postprocedural states: Secondary | ICD-10-CM

## 2019-08-20 NOTE — Patient Instructions (Signed)
Stop taking warfarin.  Continue to take all other medications including aspirin 81 mg/day  You may resume unrestricted physical activity without any particular limitations at this time.  Endocarditis is a potentially serious infection of heart valves or inside lining of the heart.  It occurs more commonly in patients with diseased heart valves (such as patient's with aortic or mitral valve disease) and in patients who have undergone heart valve repair or replacement.  Certain surgical and dental procedures may put you at risk, such as dental cleaning, other dental procedures, or any surgery involving the respiratory, urinary, gastrointestinal tract, gallbladder or prostate gland.   To minimize your chances for develooping endocarditis, maintain good oral health and seek prompt medical attention for any infections involving the mouth, teeth, gums, skin or urinary tract.    Always notify your doctor or dentist about your underlying heart valve condition before having any invasive procedures. You will need to take antibiotics before certain procedures, including all routine dental cleanings or other dental procedures.  Your cardiologist or dentist should prescribe these antibiotics for you to be taken ahead of time.

## 2019-08-20 NOTE — Progress Notes (Signed)
[address]       [place] [address]             [phone]     CARDIOTHORACIC SURGERY OFFICE NOTE  Referring Provider is Leonie Man, MD PCP is Aurea Graff, PA-C (Inactive)   HPI:  Patient is a 69 year old obese female with multiple medical problems including lupus who was admitted to the hospital with an acute exacerbation of chronic diastolic congestive heart failure and ultimately underwent mitral valve repair on April 13, 2019 for mitral valve prolapse with severe symptomatic primary mitral regurgitation.  The patient's early postoperative recovery was notable for transient hypercarbic respiratory failure which resolved quickly.  She also developed postoperative atrial fibrillation which converted back to sinus rhythm on amiodarone.  She was discharged from the hospital in sinus rhythm on long-term warfarin anticoagulation.  She was last seen here in our office on May 21, 2019 at which time she was doing well.  Since then she has been seen in follow-up by Dr. Ellyn Hack.  She has not had any recurrence of atrial fibrillation.  She has not had a follow-up echocardiogram performed.  She returns to our office today and reports that she is doing very well.  She states that her breathing is dramatically better than it was prior to her surgery.  She is physically active and back to doing everything that she wants.  She does still take oral Lasix once a day and will develop some lower extremity edema if she stops.  She has minimal residual soreness in her upper chest related to her sternotomy incision, which she states was exacerbated when she lifted a heavy object recently.  She denies any sensation of clicking or motion.  She has not had fevers or chills.  Her breathing is quite good.  She is delighted with her progress.   Current Outpatient Medications  Medication Sig Dispense Refill  . acetaminophen (TYLENOL) 325 MG tablet Take 2 tablets (650 mg total) by  mouth every 6 (six) hours as needed (back pain).    Marland Kitchen albuterol (ACCUNEB) 0.63 MG/3ML nebulizer solution Take 1 ampule by nebulization every 6 (six) hours as needed for wheezing.    Marland Kitchen aspirin EC 81 MG tablet Take 81 mg by mouth at bedtime. Reported on 07/16/2015    . atorvastatin (LIPITOR) 40 MG tablet Take 40 mg by mouth at bedtime.     . Cyanocobalamin (VITAMIN B-12) 5000 MCG LOZG Take 5,000 mcg by mouth daily.    Marland Kitchen FLUoxetine (PROZAC) 20 MG capsule Take 20 mg by mouth daily at 6 PM. (1700)    . fluticasone (FLONASE) 50 MCG/ACT nasal spray Place 2 sprays into both nostrils at bedtime.     . furosemide (LASIX) 20 MG tablet Take 1 tablet (20 mg total) by mouth daily as needed. 90 tablet 3  . levothyroxine (SYNTHROID) 125 MCG tablet Take 125 mcg by mouth daily before breakfast.    . liothyronine (CYTOMEL) 5 MCG tablet Take 5 mcg by mouth daily.    . metoprolol tartrate (LOPRESSOR) 25 MG tablet Take 0.5 tablets (12.5 mg total) by mouth 2 (two) times daily. 30 tablet 1  . Multiple Vitamin (MULTIVITAMIN WITH MINERALS) TABS tablet Take 1 tablet by mouth daily. Multivitamin Essentials    . OLANZapine (ZYPREXA) 2.5 MG tablet Take 2.5 mg by mouth daily.    Marland Kitchen omeprazole (PRILOSEC) 40 MG capsule Take 40 mg by mouth in the morning and at bedtime.     Marland Kitchen  potassium chloride (KLOR-CON) 10 MEQ tablet TAKE 1 TABLET(10 MEQ) BY MOUTH DAILY 90 tablet 3   No current facility-administered medications for this visit.      Physical Exam:   BP 113/73   Pulse 74   Temp 97.7 F (36.5 C) (Skin)   Resp 20   Ht 5\' 8"  (1.727 m)   Wt 227 lb (103 kg)   SpO2 95% Comment: RA  BMI 34.52 kg/m   General:  Obese but well-appearing  Chest:   Clear to auscultation  CV:   Regular rate and rhythm without murmur  Incisions:  Well-healed, sternum is stable  Abdomen:  Soft nontender  Extremities:  Warm and well-perfused, no edema  Diagnostic Tests:  EKG: NSR (07/12/2019)    Impression:  Patient is doing well  approximately 4 months status post mitral valve repair  Plan:  I have instructed the patient to go ahead and stop taking warfarin anticoagulation.  She should remain on aspirin 81 mg daily indefinitely.  The patient has been reminded regarding the importance of dental hygiene and the lifelong need for antibiotic prophylaxis for all dental cleanings and other related invasive procedures.  The patient will continue to follow-up intermittently with Dr. Ellyn Hack.  We will make sure that a routine follow-up echocardiogram has been ordered.  She will return to our office for routine follow-up next March, approximately 1 year following her surgery.  I spent in excess of 15 minutes during the conduct of this office consultation and >50% of this time involved direct face-to-face encounter with the patient for counseling and/or coordination of their care.    Valentina Gu. Roxy Manns, MD 08/20/2019 11:58 AM

## 2019-09-04 DIAGNOSIS — J40 Bronchitis, not specified as acute or chronic: Secondary | ICD-10-CM | POA: Diagnosis not present

## 2019-09-07 ENCOUNTER — Other Ambulatory Visit (HOSPITAL_COMMUNITY): Payer: Medicare Other

## 2019-09-10 ENCOUNTER — Ambulatory Visit (HOSPITAL_COMMUNITY): Payer: Medicare Other | Attending: Cardiology

## 2019-09-10 ENCOUNTER — Other Ambulatory Visit: Payer: Self-pay

## 2019-09-10 DIAGNOSIS — I340 Nonrheumatic mitral (valve) insufficiency: Secondary | ICD-10-CM | POA: Insufficient documentation

## 2019-09-10 HISTORY — PX: TRANSTHORACIC ECHOCARDIOGRAM: SHX275

## 2019-09-11 LAB — ECHOCARDIOGRAM COMPLETE
Area-P 1/2: 2.39 cm2
MV M vel: 4.37 m/s
MV Peak grad: 76.4 mmHg
Radius: 0.6 cm
S' Lateral: 3.7 cm

## 2019-09-18 ENCOUNTER — Other Ambulatory Visit: Payer: Self-pay | Admitting: Gastroenterology

## 2019-09-19 ENCOUNTER — Telehealth: Payer: Self-pay | Admitting: Gastroenterology

## 2019-09-19 NOTE — Telephone Encounter (Signed)
Informed patient that she has not been seen in over 2 years and did transfer her care to another GI physician last year according to our records. Patient states she did not see another GI doctor. Informed patient that I can see in our system she saw [facility] on 03/01/18 and since she transferred care we can no longer refill medication Dr. Fuller Plan is not prescribing. Patient states she only saw that doctor once and does not even remember seeing them last year. Patient would like to come back to see Dr. Fuller Plan. Informed patient she has to request to be his patient again and get all her records first from [facility]. Patient agreed and will call back.

## 2019-09-19 NOTE — Telephone Encounter (Signed)
Pt is requesting a refill on her omeprazole.

## 2019-09-25 DIAGNOSIS — H04123 Dry eye syndrome of bilateral lacrimal glands: Secondary | ICD-10-CM | POA: Diagnosis not present

## 2019-10-03 DIAGNOSIS — H25811 Combined forms of age-related cataract, right eye: Secondary | ICD-10-CM | POA: Diagnosis not present

## 2019-10-03 DIAGNOSIS — H2511 Age-related nuclear cataract, right eye: Secondary | ICD-10-CM | POA: Diagnosis not present

## 2019-10-12 ENCOUNTER — Encounter: Payer: Self-pay | Admitting: Cardiology

## 2019-10-12 ENCOUNTER — Ambulatory Visit (INDEPENDENT_AMBULATORY_CARE_PROVIDER_SITE_OTHER): Payer: Medicare Other | Admitting: Cardiology

## 2019-10-12 ENCOUNTER — Other Ambulatory Visit: Payer: Self-pay

## 2019-10-12 VITALS — BP 116/78 | HR 70 | Ht 68.0 in | Wt 229.4 lb

## 2019-10-12 DIAGNOSIS — I4891 Unspecified atrial fibrillation: Secondary | ICD-10-CM

## 2019-10-12 DIAGNOSIS — I2510 Atherosclerotic heart disease of native coronary artery without angina pectoris: Secondary | ICD-10-CM

## 2019-10-12 DIAGNOSIS — Z9889 Other specified postprocedural states: Secondary | ICD-10-CM

## 2019-10-12 DIAGNOSIS — I340 Nonrheumatic mitral (valve) insufficiency: Secondary | ICD-10-CM

## 2019-10-12 DIAGNOSIS — R002 Palpitations: Secondary | ICD-10-CM

## 2019-10-12 DIAGNOSIS — R000 Tachycardia, unspecified: Secondary | ICD-10-CM | POA: Diagnosis not present

## 2019-10-12 DIAGNOSIS — I5032 Chronic diastolic (congestive) heart failure: Secondary | ICD-10-CM | POA: Diagnosis not present

## 2019-10-12 DIAGNOSIS — I2584 Coronary atherosclerosis due to calcified coronary lesion: Secondary | ICD-10-CM

## 2019-10-12 MED ORDER — AMOXICILLIN 500 MG PO CAPS: ORAL_CAPSULE | ORAL | 3 refills | Status: DC

## 2019-10-12 NOTE — Progress Notes (Signed)
Primary Care Provider: Garth Bigness (Inactive) Cardiologist: Glenetta Hew, MD Electrophysiologist: None  Clinic Note: Chief Complaint  Patient presents with  . Follow-up    28-month second postop  . Mitral Valve Prolapse    Status post MVR    HPI:    Rebecca Carr- "Rebecca Carr  is a 69y.o. 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   s/p MITRAL VALVE REPAIR by Dr. OwRoxy Mannsarch 5,2021.  04/10/2019: [facility] with a PCWP of 33 millimercury and a V wave 62 mmHg -> treated medically and then taken for urgent MVR with Dr. OwRoxy Manns3/06/2019: MVR - Sorin Memo 4D ring Annuloplasty (34 mm), artificial Gore-Tex-neocord replacement x 6, LAA clipping.  Post-extubation acute respiratory failure - re-intubated --> IV diuresis, Inhaled NO --> post-op Afib - Rx with Amiodarone & Warfarin.   Recent Hospitalizations:  None since MVR  Rebecca Majoras last seen on July 12, 2019 for first follow-up with me.  Amiodarone has been discontinued by Dr. OwRoxy Manns She is feeling remarkably better.  Breathing notably improved.  Much less swelling.  Able to sleep without significant apnea PND.  Walking a lot more.  Still noted that her heart rate would go up every now and then but admitted to significant deconditioning.  Notably improved edema.  Energy level slowly improving.  Reviewed  CV studies:    The following studies were reviewed today: (if available, images/films reviewed: From Epic Chart or Care Everywhere)   TTE (09/10/2019): LVEF 50-55%.  Low normal.  Mild concentric LVH.  Unable to assess diastolic function.  Mildly  elevated PA P with mildly dilated PA.  Moderate LA dilation.  Mild-moderate MR but no MS.  Mean mitral gradient 3.5 mmHg at 79 bpm.  Prosthetic annuloplasty ring noted.  Mild aortic sclerosis with no stenosis.  Interval History:   Rebecca Willigeturns here today for her second follow-up visit with me as a 3-34-monthllow-up.  She says that she is definitely feeling a whole lot better.  She has had both her eyes done with cataract surgery.  Happy that she can see.  She has not had any Carr complications postop.  Rarely feels her heart rate going up.  She occasionally will feel a leg pinprick-like sensation along where her incision was but otherwise no chest pain.  Not really noticing much in the way of any palpitations.  She is not very active, but is walk around a lot more than she used to.  She is taking her Lasix every day and maybe once or twice a month will take an extra dose.  Energy level is notably improved and breathing has dramatically improved since her surgery.  Easily able to do ADLs and now starting to walk more.  Sleeping relatively well as far as orthopnea PND.  CV Review of Symptoms (Summary) Cardiovascular ROS: positive for - Minimal residual musculoskeletal pain postop. negative for - chest pain, dyspnea on exertion, edema, irregular heartbeat, orthopnea, palpitations, paroxysmal nocturnal dyspnea, rapid heart rate, shortness of breath or Lightheadedness or dizziness, syncope/near syncope, TIA/amaurosis fugax, claudication  The patient DOES NOT have symptoms concerning for COVID-19 infection (  fever, chills, cough, or new shortness of breath).  The patient is practicing social distancing & Masking.    REVIEWED OF SYSTEMS   Review of Systems  Constitutional: Negative for malaise/fatigue (Slowly returning back to normal energy level.) and weight loss.  HENT: Negative for congestion and nosebleeds.   Respiratory: Negative for cough, shortness of breath and wheezing.     Cardiovascular: Positive for leg swelling (Notably improved ).  Gastrointestinal: Negative for abdominal pain, blood in stool and melena.  Genitourinary: Negative for hematuria.  Musculoskeletal: Negative for joint pain.  Skin:       She has a cyst or boil on her back that may need to be lanced and drained.  Asked about antibiotics as result.  Neurological: Negative for dizziness, focal weakness, weakness and headaches.  Psychiatric/Behavioral: Negative.  Negative for memory loss. The patient does not have insomnia.    I have reviewed and (if needed) personally updated the patient's problem list, medications, allergies, past medical and surgical history, social and family history.   PAST MEDICAL HISTORY   Past Medical History:  Diagnosis Date  . Antiphospholipid syndrome ([place])   . Anxiety   . Arthritis   . Clotting disorder ([place])   . COPD (chronic obstructive pulmonary disease) ([place])   . DES exposure in utero   . Diverticulosis of colon   . Dizziness   . Environmental and seasonal allergies   . GERD (gastroesophageal reflux disease)   . 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  Marland Kitchen Hashimoto's thyroiditis   . Hiatal hernia   . History of adenomatous polyp of colon    hyperplastic 2015  and tubular adenoma 2005  . History of esophageal dilatation    of stricture  . History of esophagitis    LA class , grade B and Gastritis  . History of pituitary tumor    dx 1990's - asymptomatic /  per pt last scan 2005 approx.  resolved   . Hyperlipidemia   . Hypothyroidism   . Leg pain, bilateral   . Mild intermittent asthma   . MTHFR mutation ([place])   . OSA (obstructive sleep apnea)    non-compliant cpap  . Prediabetes   . S/P mitral valve repair 04/13/2019   Complex valvuloplasty including triangular resection of flail posterior leaflet + artificial Gore-tex  neochords x6 and 34 mm Memo 4D ring annuloplasty  . SUI (stress urinary incontinence, female)   . VIN III (vulvar intraepithelial neoplasia III)   . Wears glasses     PAST SURGICAL HISTORY   Past Surgical History:  Procedure Laterality Date  . BUBBLE STUDY  04/10/2019   Procedure: BUBBLE STUDY;  Surgeon: Sueanne Margarita, MD;  Location: [facility] ENDOSCOPY;  Service: Cardiovascular;;  . CARDIAC EVENT MONITOR  08/2016   Normal sinus rhythm with sinus tachycardia.  Average heart rate 91 bpm.  Occasional PVCs.  One 12 and another 6 beat run of wide-complex tachycardia.  2 morphology PVCs.PVCs noted, but not during longer episode of WCT.  Marland Kitchen CESAREAN SECTION  1994  . CLIPPING OF ATRIAL APPENDAGE N/A 04/13/2019   Procedure: Clipping Of Atrial Appendage using AtriCure 45 MM AtriClip.;  Surgeon: Rexene Alberts, MD;  Location: [place];  Service: Open Heart Surgery;  Laterality: N/A;  . CO2 LASER APPLICATION N/A 6/44/0347   Procedure: CO2 LASER APPLICATION ;  Surgeon: Everitt Amber, MD;  Location: [place];  Service: Gynecology;  Laterality: N/A;  . COLONOSCOPY, ESOPHAGOGASTRODUODENOSCOPY (EGD) AND ESOPHAGEAL DILATION  10-04-2013  . LAPAROSCOPIC CHOLECYSTECTOMY  1995  . LASER ABLATION CONDOLAMATA N/A 07/03/2015   Procedure: CO2 LASER OF VULVA ;  Surgeon: Everitt Amber, MD;  Location: [facility];  Service: Gynecology;  Laterality: N/A;  . MITRAL VALVE REPAIR N/A 04/13/2019   Procedure: MITRAL VALVE REPAIR (MVR) using Memo 4D 34 MM Mitral Valve.;  Surgeon: Rexene Alberts, MD;  Location: [place];  Service: Open Heart Surgery;  Laterality: N/A;  . NM MYOVIEW LTD  10/2016   LOW RISK.  No ischemia or infarction.  EF 54%.  Marland Kitchen RIGHT/LEFT HEART CATH AND CORONARY ANGIOGRAPHY N/A 04/10/2019   Procedure: RIGHT/LEFT HEART CATH AND CORONARY ANGIOGRAPHY;  Surgeon: Leonie Man, MD:: Angiographically normal coronary arteries.  Moderate-severe pulmonary hypertension with severe MR.  PAP-mean 67/27  mmHg - 44 mmHg, PCWP 26/62 mmHg-(V wave 62 mmHg).  RVP-EDP 66/14 mmHg - 22 mmHg, RAP mean 20 mmHg.  AoP-MAP 96/60 mmHg - 76 mmHg.  Fick CO-CI: 5.24-2.37. --> admitted for diuresis & MVR.  . TEE WITHOUT CARDIOVERSION N/A 04/10/2019   Procedure: TRANSESOPHAGEAL ECHOCARDIOGRAM (TEE);  Surgeon: Sueanne Margarita, MD;  [facility] ENDOSCOPY::  EF 60 to 65%.  No R WMA.  Mildly reduced RV function.  Severe LA dilation.  Severe RA dilation.  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  Severe MR with eccentric anterior directed jet.  PV flow show systolic reversal.  Mild to moderate TR.  Mild AoV sclerosis w/o AS  . TEE WITHOUT CARDIOVERSION N/A 04/13/2019   Procedure: TRANSESOPHAGEAL ECHOCARDIOGRAM (TEE);  Surgeon: Rexene Alberts, MD;  Location: [facility] OR: EF > 65% Preop-myxomatous MV with severe prolapse -flail P2 leaflet w/ ruptured chordae.  Severe MR-anterior.  Pulm V flow reversal.--> Post-OP: EF 40-50%.  No R WMA.  Mild LV dilation.  MV now repaired.  Trivial/no MR.  No MS (peak 7 mmHg, mean 79mHg).  Annular ring intact - no leak  . TRANSTHORACIC ECHOCARDIOGRAM  03/2019   EF 60-65% with significant posterior mitral valve leaflet prolapse with moderate severe MR.    . TRANSTHORACIC ECHOCARDIOGRAM  08/2016   EF 60 of 65%.  Severe LVH.  Pseudo-normal filling (grade 2 diastolic dysfunction).  MAC with calcified mobile attachments.  Posterior leaflet prolapse.  Mild regurgitation.  Moderate LA dilation.  . TRANSTHORACIC ECHOCARDIOGRAM  09/10/2019   s/p MVR:  LVEF 50-55%.  Low normal.  Mild concentric LVH.  Unable to assess diastolic function.  Mildly elevated PA P with mildly dilated PA.  Moderate LA dilation.  Mild-moderate MR but no MS.  Mean mitral gradient 3.5 mmHg at 79 bpm.  Prosthetic annuloplasty ring noted.  Mild aortic sclerosis with no stenosis.   . April 10, 2019-TEE: EF 60 to 65%.  No R WMA.  Mildly reduced RV function.  Severe LA dilation.  Severe RA dilation.  Myxomatous mitral valve with P2  scallop flail leaflet.  Severe MR with eccentric anterior directed jet.  PV flow show systolic reversal.  Mild to moderate TR.  Mild aortic valve sclerosis but no stenosis. . April 10, 2019-RLCP: Angiographically normal coronary arteries.  Moderate-severe pulmonary hypertension with severe MR.  PAP-mean 67/27 mmHg - 44 mmHg, PCWP 26/62 mmHg-(V wave 62 mmHg).  RVP-EDP 66/14 mmHg - 22 mmHg, RAP mean 20 mmHg.  AoP-MAP 96/60 mmHg - 76 mmHg.  Fick CO-CI: 5.24-2.37  April 13, 2019 -  MVR (Dr. Roxy Manns)  TEE: EF > 65% Preop-myxomatous MV with severe prolapse -flail P2 leaflet with apparent ruptured chordae..  Severe MR-anterior.  Pulm V flow reversal.--> Post-OP: EF 40 to 50%.  No R WMA.  Mild LV dilation.  MV now repaired.  Trivial to no residual MR.  No MS (peak gradient 7 mmHg, mean to mmHg).  Annular ring intact with no leak.  Trivial TR.   MEDICATIONS/ALLERGIES   Current Meds  Medication Sig  . acetaminophen (TYLENOL) 325 MG tablet Take 2 tablets (650 mg total) by mouth every 6 (six) hours as needed (back pain).  Marland Kitchen aspirin EC 81 MG tablet Take 81 mg by mouth at bedtime. Reported on 07/16/2015  . atorvastatin (LIPITOR) 40 MG tablet Take 40 mg by mouth at bedtime.   . Cyanocobalamin (VITAMIN B-12) 5000 MCG LOZG Take 5,000 mcg by mouth daily.  Marland Kitchen FLUoxetine (PROZAC) 20 MG capsule Take 20 mg by mouth daily at 6 PM. (1700)  . fluticasone (FLONASE) 50 MCG/ACT nasal spray Place 2 sprays into both nostrils at bedtime.   . furosemide (LASIX) 20 MG tablet Take 1 tablet (20 mg total) by mouth daily as needed.  . hydroxychloroquine (PLAQUENIL) 200 MG tablet TAKE 1 TABLET(200 MG) BY MOUTH TWICE DAILY  . levothyroxine (SYNTHROID) 125 MCG tablet Take 125 mcg by mouth daily before breakfast.  . liothyronine (CYTOMEL) 5 MCG tablet Take 5 mcg by mouth daily.  . metoprolol tartrate (LOPRESSOR) 25 MG tablet Take 0.5 tablets (12.5 mg total) by mouth 2 (two) times daily.  Marland Kitchen moxifloxacin (VIGAMOX) 0.5 % ophthalmic solution     . Multiple Vitamin (MULTIVITAMIN WITH MINERALS) TABS tablet Take 1 tablet by mouth daily. Multivitamin Essentials  . omeprazole (PRILOSEC) 40 MG capsule Take 40 mg by mouth in the morning and at bedtime.   . potassium chloride (KLOR-CON) 10 MEQ tablet TAKE 1 TABLET(10 MEQ) BY MOUTH DAILY    Allergies  Allergen Reactions  . Iodine Anaphylaxis  . Iohexol Anaphylaxis     Desc: Patient states she is allergic to iodine and had a "code blue" incident after an injection for some type of imaging study.   . Penicillins Rash    PCN IN LARGE DOSES. Did it involve swelling of the face/tongue/throat, SOB, or low BP? No Did it involve sudden or severe rash/hives, skin peeling, or any reaction on the inside of your mouth or nose? No Did you need to seek medical attention at a hospital or doctor's office? No When did it last happen? More than 10 years ago If all above answers are "NO", may proceed with cephalosporin use.     SOCIAL HISTORY/FAMILY HISTORY   Reviewed in Epic:  Pertinent findings: No   OBJCTIVE -PE, EKG, labs   Wt Readings from Last 3 Encounters:  10/12/19 229 lb 6.4 oz (104.1 kg)  08/20/19 227 lb (103 kg)  07/12/19 225 lb (102.1 kg)    Physical Exam: BP 116/78   Pulse 70   Ht _0  (1.727 m)   Wt 229 lb 6.4 oz (104.1 kg)   SpO2 98%   BMI 34.88 kg/m  Physical Exam Vitals reviewed.  Constitutional:      General: She is not in acute distress.    Appearance: Normal appearance. She is well-developed. She is obese. She is not ill-appearing.     Comments: Mildly obese.  Well-groomed.  Healthy-appearing  HENT:     Head: Normocephalic and atraumatic.  Neck:     Vascular: No carotid  bruit, hepatojugular reflux or JVD.  Cardiovascular:     Rate and Rhythm: Normal rate and regular rhythm.  No extrasystoles are present.    Chest Wall: PMI is not displaced.     Pulses: Intact distal pulses.     Heart sounds: Normal heart sounds. No murmur heard.  No friction rub. No  gallop.   Pulmonary:     Effort: Pulmonary effort is normal. No respiratory distress.     Breath sounds: Normal breath sounds. No wheezing or rales.  Musculoskeletal:        General: No swelling (Trivial bilateral LE swelling). Normal range of motion.     Cervical back: Normal range of motion and neck supple.  Neurological:     General: No focal deficit present.     Mental Status: She is alert and oriented to person, place, and time.  Psychiatric:        Behavior: Behavior normal.        Thought Content: Thought content normal.        Judgment: Judgment normal.     Adult ECG Report N/A  Recent Labs:    No results found for: CHOL, HDL, LDLCALC, LDLDIRECT, TRIG, CHOLHDL Lab Results  Component Value Date   CREATININE 1.15 (H) 05/10/2019   BUN 22 05/10/2019   NA 143 05/10/2019   K 4.8 05/10/2019   CL 103 05/10/2019   CO2 24 05/10/2019   Lab Results  Component Value Date   TSH 5.506 (H) 04/16/2019    ASSESSMENT/PLAN    Problem List Items Addressed This Visit    Severe mitral regurgitation due to severe cusp prolapse - Primary (Chronic)    Pretty significant progression of her mitral fixation of the last year with profound symptoms.  Now status post MVR.  Her end-stage condition was complicated by severe diastolic heart failure with elevated pulmonary pressures.  Follow-up echocardiogram shows well-functioning valve with no stenosis or regurgitation.  Breathing is notably improved postop.  Plan:  We will continue low-dose beta-blocker to maintain heart rate and blood pressure control. Follow-up echo next year. Discussed antibiotic prophylaxis.-Amoxicillin (as needed prescription provided)       Relevant Orders   ECHOCARDIOGRAM COMPLETE   Chronic diastolic heart failure (HCC) (Chronic)    Notably improved symptoms now with reassuring findings on follow-up echocardiogram.  She is now only requiring low-dose Lasix daily with infrequent as needed dosing.  Blood pressure  is very well controlled on low-dose Lopressor.  Plan: Continue low-dose furosemide with standing dose plus as needed.      Relevant Orders   ECHOCARDIOGRAM COMPLETE   Palpitations (Chronic)    Symptoms seem to be totally improved now post MVR and on metoprolol.  On minimal beta-blocker dose now.      Relevant Orders   ECHOCARDIOGRAM COMPLETE   Coronary artery calcification of native artery (Chronic)    Nonischemic Myoview with relatively normal coronaries on cardiac catheterization.  Routine prophylactic management with aspirin and statin along with beta-blocker.      Relevant Orders   ECHOCARDIOGRAM COMPLETE   S/P mitral valve repair (Chronic)    Very difficult procedure with complex valvuloplasty including triangular resection of flail posterior leaflet and artificial Gore-Tex neochords x6 with a 34 mm memo D ring angioplasty. Valve working well on follow-up echocardiogram.  Would be due for follow-up next year.  Discussed importance of antibiotic prophylaxis. -> She does have the cyst on her back which if they are planning I&D would definitely benefit from prophylactic  antibiotics.  -> If procedure will be performed, I think would be best to provide her with periprocedural antibiotics (amoxicillin -2 g p.o. 1 hour preop or if indicated IV antibiotics such as ampicillin or ceftriaxone)      Relevant Orders   ECHOCARDIOGRAM COMPLETE   Rapid heart rate    No longer having significant issues with tachycardia-bigeminy PVCs etc.  On very low-dose beta-blocker.      Atrial fibrillation (HCC)    Postop A. fib, no longer having any issues.  No longer on warfarin or amiodarone.  Only on aspirin.  Monitor for recurrence.  She does have left atrial enlargement.          COVID-19 Education: The signs and symptoms of COVID-19 were discussed with the patient and how to seek care for testing (follow up with PCP or arrange E-visit).   The importance of social distancing and COVID-19  vaccination was discussed today.  I spent a total of 28 minutes with the patient. >  50% of the time was spent in direct patient consultation.  Additional time spent with chart review  / charting (studies, outside notes, etc): 14 Total Time: 42 min   Current medicines are reviewed at length with the patient today.  (+/- concerns) none  Notice: This dictation was prepared with Dragon dictation along with smaller phrase technology. Any transcriptional errors that result from this process are unintentional and may not be corrected upon review.  Patient Instructions / Medication Changes & Studies & Tests Ordered   Patient Instructions  Medication Instructions:  Continue same medications *If you need a refill on your cardiac medications before your next appointment, please call your pharmacy*   Lab Work: None ordered   Testing/Procedures: Schedule Echo in March   Follow-Up: At [facility], you and your health needs are our priority.  As part of our continuing mission to provide you with exceptional heart care, we have created designated Provider Care Teams.  These Care Teams include your primary Cardiologist (physician) and Advanced Practice Providers (APPs -  Physician Assistants and Nurse Practitioners) who all work together to provide you with the care you need, when you need it.  We recommend signing up for the patient portal called "MyChart".  Sign up information is provided on this After Visit Summary.  MyChart is used to connect with patients for Virtual Visits (Telemedicine).  Patients are able to view lab/test results, encounter notes, upcoming appointments, etc.  Non-urgent messages can be sent to your provider as well.   To learn more about what you can do with MyChart, go to [email or web address].    Your next appointment: March after Echo   The format for your next appointment: Office   Provider:  Dr.Charish Schroepfer    Studies Ordered:   Orders Placed This Encounter    Procedures  . ECHOCARDIOGRAM COMPLETE     Glenetta Hew, M.D., M.S. Interventional Cardiologist   Pager # [phone] Phone # [phone] [address] [place], [place] [address]   Thank you for choosing Heartcare at [facility]!!

## 2019-10-12 NOTE — Patient Instructions (Signed)
Medication Instructions:  Continue same medications *If you need a refill on your cardiac medications before your next appointment, please call your pharmacy*   Lab Work: None ordered   Testing/Procedures: Schedule Echo in March   Follow-Up: At [facility], you and your health needs are our priority.  As part of our continuing mission to provide you with exceptional heart care, we have created designated Provider Care Teams.  These Care Teams include your primary Cardiologist (physician) and Advanced Practice Providers (APPs -  Physician Assistants and Nurse Practitioners) who all work together to provide you with the care you need, when you need it.  We recommend signing up for the patient portal called "MyChart".  Sign up information is provided on this After Visit Summary.  MyChart is used to connect with patients for Virtual Visits (Telemedicine).  Patients are able to view lab/test results, encounter notes, upcoming appointments, etc.  Non-urgent messages can be sent to your provider as well.   To learn more about what you can do with MyChart, go to [email or web address].    Your next appointment: March after Echo   The format for your next appointment: Office   Provider:  Dr.Harding

## 2019-10-16 ENCOUNTER — Telehealth: Payer: Self-pay

## 2019-10-16 ENCOUNTER — Telehealth: Payer: Self-pay | Admitting: Cardiology

## 2019-10-16 NOTE — Telephone Encounter (Signed)
Patient contacted the office to ask if she still needed to take Potassium.  She is s/p MVRepair with Dr. Roxy Manns 04/13/19.  She stated that her kidney function was elevated and wondered if she needed to continue taking it.  Advised patient to contact her Cardiologist, Dr. Ellyn Hack for advise to continue or stop it.  She is currently taking 20 mg lasix daily as needed.

## 2019-10-16 NOTE — Telephone Encounter (Signed)
Pt called and states that she has been diagnosed with a kidney issue and it has gotten worse, wants to know if she takes furosemide (LASIX) 20 MG tablet on an as needed basis, does she need to stop taking potassium chloride (KLOR-CON) 10 MEQ tablet. Heard that if you have kidney issue, you should not be taking potassium chloride (KLOR-CON) 10 MEQ tablet. Please call to confirm

## 2019-10-16 NOTE — Telephone Encounter (Signed)
Called patient back about her message. Patient wants to know if she needs to be taking her potassium daily due to her Creatinine being 1.17 on her lab work with [employer]. Patient was also concerned about taking lasix. Informed patient she is suppose to take lasix as needed, and if she is taking lasix she needs to take potassium with it. Informed patient that her potassium is ordered daily, so the doctor would have to give the order to take it as needed. Patient last K was 4.2. Patient is concerned because she has been diagnosed with kidney issues. Will forward to Dr. Ellyn Hack for advisement.

## 2019-10-18 NOTE — Telephone Encounter (Signed)
The potassium is to be taken when she takes Lasix both her as needed.  Glenetta Hew, MD

## 2019-10-21 ENCOUNTER — Encounter: Payer: Self-pay | Admitting: Cardiology

## 2019-10-21 NOTE — Assessment & Plan Note (Signed)
Postop A. fib, no longer having any issues.  No longer on warfarin or amiodarone.  Only on aspirin.  Monitor for recurrence.  She does have left atrial enlargement.

## 2019-10-21 NOTE — Assessment & Plan Note (Signed)
Very difficult procedure with complex valvuloplasty including triangular resection of flail posterior leaflet and artificial Gore-Tex neochords x6 with a 34 mm memo D ring angioplasty. Valve working well on follow-up echocardiogram.  Would be due for follow-up next year.  Discussed importance of antibiotic prophylaxis. -> She does have the cyst on her back which if they are planning I&D would definitely benefit from prophylactic antibiotics.  -> If procedure will be performed, I think would be best to provide her with periprocedural antibiotics (amoxicillin -2 g p.o. 1 hour preop or if indicated IV antibiotics such as ampicillin or ceftriaxone)

## 2019-10-21 NOTE — Assessment & Plan Note (Signed)
Notably improved symptoms now with reassuring findings on follow-up echocardiogram.  She is now only requiring low-dose Lasix daily with infrequent as needed dosing.  Blood pressure is very well controlled on low-dose Lopressor.  Plan: Continue low-dose furosemide with standing dose plus as needed.

## 2019-10-21 NOTE — Assessment & Plan Note (Signed)
Symptoms seem to be totally improved now post MVR and on metoprolol.  On minimal beta-blocker dose now.

## 2019-10-21 NOTE — Assessment & Plan Note (Addendum)
Pretty significant progression of her mitral fixation of the last year with profound symptoms.  Now status post MVR.  Her end-stage condition was complicated by severe diastolic heart failure with elevated pulmonary pressures.  Follow-up echocardiogram shows well-functioning valve with no stenosis or regurgitation.  Breathing is notably improved postop.  Plan:  We will continue low-dose beta-blocker to maintain heart rate and blood pressure control. Follow-up echo next year. Discussed antibiotic prophylaxis.-Amoxicillin (as needed prescription provided)

## 2019-10-21 NOTE — Assessment & Plan Note (Signed)
No longer having significant issues with tachycardia-bigeminy PVCs etc.  On very low-dose beta-blocker.

## 2019-10-21 NOTE — Assessment & Plan Note (Signed)
Nonischemic Myoview with relatively normal coronaries on cardiac catheterization.  Routine prophylactic management with aspirin and statin along with beta-blocker.

## 2019-10-22 NOTE — Telephone Encounter (Signed)
Spoke with patient. She is aware to only use potassium and lasix together as needed. Patient verbalized understanding

## 2019-10-23 NOTE — Telephone Encounter (Signed)
Patient's records from [facility] have been put on your desk to review. Please advise if you want to accept patient back.

## 2019-10-23 NOTE — Telephone Encounter (Signed)
Records from [facility] reviewed. She can return if she would like however I may not have any appts available for several weeks. Please offer her an APP visit if this will allow a quicker appt. Since she has changed gastroenterologists she needs to get GI med refills from her current GI provider, Rowe Clack, PA, at [place] or her PCP. We can provide GI med refills once she has re-established her care with our practice.

## 2019-10-24 ENCOUNTER — Other Ambulatory Visit: Payer: Self-pay | Admitting: Cardiology

## 2019-10-24 DIAGNOSIS — Z23 Encounter for immunization: Secondary | ICD-10-CM | POA: Diagnosis not present

## 2019-10-25 ENCOUNTER — Other Ambulatory Visit: Payer: Self-pay | Admitting: Cardiology

## 2019-10-26 NOTE — Telephone Encounter (Signed)
Pt is requesting a refill on her omeprazole, pt is scheduled with an APP for 10/20

## 2019-10-26 NOTE — Telephone Encounter (Signed)
See previous note from Dr. Fuller Plan. Patient needs to call current GI doctor for refills until we see her on 10/20. Thanks.

## 2019-11-01 DIAGNOSIS — L720 Epidermal cyst: Secondary | ICD-10-CM | POA: Diagnosis not present

## 2019-11-01 DIAGNOSIS — L910 Hypertrophic scar: Secondary | ICD-10-CM | POA: Diagnosis not present

## 2019-11-06 ENCOUNTER — Other Ambulatory Visit: Payer: Self-pay | Admitting: Gastroenterology

## 2019-11-07 ENCOUNTER — Telehealth: Payer: Self-pay | Admitting: Gastroenterology

## 2019-11-07 NOTE — Telephone Encounter (Signed)
Please see previous phone note. Once again, Dr. Fuller Plan does not want to refill until scheduled appt. Patient needs to request refills from previous GI doctor.

## 2019-11-07 NOTE — Telephone Encounter (Signed)
Hi Rebecca Carr!  I spoke with patient. Patient is aware and understands to request refills from previous GI doctor.. Thank you

## 2019-11-21 ENCOUNTER — Encounter: Payer: Self-pay | Admitting: Internal Medicine

## 2019-11-27 ENCOUNTER — Ambulatory Visit: Payer: Medicare Other | Admitting: Physician Assistant

## 2019-11-30 DIAGNOSIS — E7800 Pure hypercholesterolemia, unspecified: Secondary | ICD-10-CM | POA: Diagnosis not present

## 2019-11-30 DIAGNOSIS — E039 Hypothyroidism, unspecified: Secondary | ICD-10-CM | POA: Diagnosis not present

## 2019-11-30 DIAGNOSIS — M329 Systemic lupus erythematosus, unspecified: Secondary | ICD-10-CM | POA: Diagnosis not present

## 2019-11-30 DIAGNOSIS — Z8639 Personal history of other endocrine, nutritional and metabolic disease: Secondary | ICD-10-CM | POA: Diagnosis not present

## 2019-11-30 DIAGNOSIS — J449 Chronic obstructive pulmonary disease, unspecified: Secondary | ICD-10-CM | POA: Diagnosis not present

## 2019-11-30 DIAGNOSIS — D631 Anemia in chronic kidney disease: Secondary | ICD-10-CM | POA: Diagnosis not present

## 2019-11-30 DIAGNOSIS — Z1589 Genetic susceptibility to other disease: Secondary | ICD-10-CM | POA: Diagnosis not present

## 2019-11-30 DIAGNOSIS — N393 Stress incontinence (female) (male): Secondary | ICD-10-CM | POA: Diagnosis not present

## 2019-11-30 DIAGNOSIS — N1831 Chronic kidney disease, stage 3a: Secondary | ICD-10-CM | POA: Diagnosis not present

## 2019-12-04 DIAGNOSIS — M359 Systemic involvement of connective tissue, unspecified: Secondary | ICD-10-CM | POA: Diagnosis not present

## 2019-12-04 DIAGNOSIS — K219 Gastro-esophageal reflux disease without esophagitis: Secondary | ICD-10-CM | POA: Diagnosis not present

## 2019-12-04 DIAGNOSIS — Z0000 Encounter for general adult medical examination without abnormal findings: Secondary | ICD-10-CM | POA: Diagnosis not present

## 2019-12-04 DIAGNOSIS — E039 Hypothyroidism, unspecified: Secondary | ICD-10-CM | POA: Diagnosis not present

## 2019-12-04 DIAGNOSIS — Z23 Encounter for immunization: Secondary | ICD-10-CM | POA: Diagnosis not present

## 2019-12-04 DIAGNOSIS — F439 Reaction to severe stress, unspecified: Secondary | ICD-10-CM | POA: Diagnosis not present

## 2019-12-04 DIAGNOSIS — I341 Nonrheumatic mitral (valve) prolapse: Secondary | ICD-10-CM | POA: Diagnosis not present

## 2019-12-04 DIAGNOSIS — J309 Allergic rhinitis, unspecified: Secondary | ICD-10-CM | POA: Diagnosis not present

## 2019-12-04 DIAGNOSIS — E782 Mixed hyperlipidemia: Secondary | ICD-10-CM | POA: Diagnosis not present

## 2019-12-05 ENCOUNTER — Other Ambulatory Visit: Payer: Self-pay | Admitting: Internal Medicine

## 2019-12-05 ENCOUNTER — Encounter: Payer: Self-pay | Admitting: Gastroenterology

## 2019-12-05 ENCOUNTER — Ambulatory Visit (INDEPENDENT_AMBULATORY_CARE_PROVIDER_SITE_OTHER): Payer: Medicare Other | Admitting: Gastroenterology

## 2019-12-05 VITALS — BP 120/60 | HR 80 | Ht 67.32 in | Wt 231.0 lb

## 2019-12-05 DIAGNOSIS — I2584 Coronary atherosclerosis due to calcified coronary lesion: Secondary | ICD-10-CM

## 2019-12-05 DIAGNOSIS — E039 Hypothyroidism, unspecified: Secondary | ICD-10-CM | POA: Diagnosis not present

## 2019-12-05 DIAGNOSIS — R109 Unspecified abdominal pain: Secondary | ICD-10-CM

## 2019-12-05 DIAGNOSIS — E782 Mixed hyperlipidemia: Secondary | ICD-10-CM | POA: Diagnosis not present

## 2019-12-05 DIAGNOSIS — K219 Gastro-esophageal reflux disease without esophagitis: Secondary | ICD-10-CM | POA: Diagnosis not present

## 2019-12-05 DIAGNOSIS — N1831 Chronic kidney disease, stage 3a: Secondary | ICD-10-CM

## 2019-12-05 DIAGNOSIS — Z0000 Encounter for general adult medical examination without abnormal findings: Secondary | ICD-10-CM | POA: Diagnosis not present

## 2019-12-05 DIAGNOSIS — I2510 Atherosclerotic heart disease of native coronary artery without angina pectoris: Secondary | ICD-10-CM

## 2019-12-05 MED ORDER — OMEPRAZOLE 40 MG PO CPDR: 40.0000 mg | DELAYED_RELEASE_CAPSULE | Freq: Two times a day (BID) | ORAL | 4 refills | Status: AC

## 2019-12-05 NOTE — Progress Notes (Signed)
12/05/2019 Rebecca Carr [id number] 1950/02/17   HISTORY OF PRESENT ILLNESS: This is a 69 year old female who is a patient of Dr. Lynne Leader.  She is here today for follow-up of her GERD and to have her omeprazole refilled.  She takes omeprazole 40 mg twice daily.  It controls her symptoms well.  Somehow she had been referred to a GI practice at [facility] for complaints of dysphagia in 2020.  They performed an esophagram that was really unremarkable and showed no evidence of stricture, etc.  Those results are in [place].  Nonetheless, she says that she wants to continue following here.  During my examination she reports some tenderness in the left side of her abdomen.  She says that she has had pain on the left side of her abdomen for years.  More so bothersome with certain movements.  She is up-to-date with colonoscopy.  She says that it has been present for years and she is "still here", indicating this likely nothing serious.   Past Medical History:  Diagnosis Date  . Antiphospholipid syndrome ([place])   . Anxiety   . Arthritis   . Clotting disorder ([place])   . COPD (chronic obstructive pulmonary disease) ([place])   . DES exposure in utero   . Diverticulosis of colon   . Dizziness   . Environmental and seasonal allergies   . GERD (gastroesophageal reflux disease)   . 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  Marland Kitchen Hashimoto's thyroiditis   . Hiatal hernia   . History of adenomatous polyp of colon    hyperplastic 2015  and tubular adenoma 2005  . History of esophageal dilatation    of stricture  . History of esophagitis    LA class , grade B and Gastritis  . History of pituitary tumor    dx 1990's - asymptomatic /  per pt last scan 2005 approx.  resolved   . Hyperlipidemia   . Hypothyroidism   . Leg pain, bilateral   . Mild intermittent asthma   . MTHFR  mutation   . OSA (obstructive sleep apnea)    non-compliant cpap  . Prediabetes   . 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 SUI (stress urinary incontinence, female)   . VIN III (vulvar intraepithelial neoplasia III)   . Wears glasses    Past Surgical History:  Procedure Laterality Date  . BUBBLE STUDY  04/10/2019   Procedure: BUBBLE STUDY;  Surgeon: Sueanne Margarita, MD;  Location: [facility] ENDOSCOPY;  Service: Cardiovascular;;  . CARDIAC EVENT MONITOR  08/2016   Normal sinus rhythm with sinus tachycardia.  Average heart rate 91 bpm.  Occasional PVCs.  One 12 and another 6 beat run of wide-complex tachycardia.  2 morphology PVCs.PVCs noted, but not during longer episode of WCT.  Marland Kitchen CESAREAN SECTION  1994  . CLIPPING OF ATRIAL APPENDAGE N/A 04/13/2019   Procedure: Clipping Of Atrial Appendage using AtriCure 45 MM AtriClip.;  Surgeon: Rexene Alberts, MD;  Location: [place];  Service: Open Heart Surgery;  Laterality: N/A;  . CO2 LASER APPLICATION N/A 06/16/3265   Procedure: CO2 LASER APPLICATION ;  Surgeon: Everitt Amber, MD;  Location: [facility];  Service: Gynecology;  Laterality: N/A;  . COLONOSCOPY,  ESOPHAGOGASTRODUODENOSCOPY (EGD) AND ESOPHAGEAL DILATION  10-04-2013  . LAPAROSCOPIC CHOLECYSTECTOMY  1995  . LASER ABLATION CONDOLAMATA N/A 07/03/2015   Procedure: CO2 LASER OF VULVA ;  Surgeon: Everitt Amber, MD;  Location: [facility];  Service: Gynecology;  Laterality: N/A;  . MITRAL VALVE REPAIR N/A 04/13/2019   Procedure: MITRAL VALVE REPAIR (MVR) using Memo 4D 34 MM Mitral Valve.;  Surgeon: Rexene Alberts, MD;  Location: [place];  Service: Open Heart Surgery;  Laterality: N/A;  . NM MYOVIEW LTD  10/2016   LOW RISK.  No ischemia or infarction.  EF 54%.  Marland Kitchen RIGHT/LEFT HEART CATH AND CORONARY ANGIOGRAPHY N/A 04/10/2019   Procedure:  RIGHT/LEFT HEART CATH AND CORONARY ANGIOGRAPHY;  Surgeon: Leonie Man, MD:: Angiographically normal coronary arteries.  Moderate-severe pulmonary hypertension with severe MR.  PAP-mean 67/27 mmHg - 44 mmHg, PCWP 26/62 mmHg-(V wave 62 mmHg).  RVP-EDP 66/14 mmHg - 22 mmHg, RAP mean 20 mmHg.  AoP-MAP 96/60 mmHg - 76 mmHg.  Fick CO-CI: 5.24-2.37. --> admitted for diuresis & MVR.  . TEE WITHOUT CARDIOVERSION N/A 04/10/2019   Procedure: TRANSESOPHAGEAL ECHOCARDIOGRAM (TEE);  Surgeon: Sueanne Margarita, MD;  Location: [facility] ENDOSCOPY::  EF 60 to 65%.  No R WMA.  Mildly reduced RV function.  Severe LA dilation.  Severe RA dilation.  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  Severe MR with eccentric anterior directed jet.  PV flow show systolic reversal.  Mild to moderate TR.  Mild AoV sclerosis w/o AS  . TEE WITHOUT CARDIOVERSION N/A 04/13/2019   Procedure: TRANSESOPHAGEAL ECHOCARDIOGRAM (TEE);  Surgeon: Rexene Alberts, MD;  Location: [facility] OR: EF > 65% Preop-myxomatous MV with severe prolapse -flail P2 leaflet w/ ruptured chordae.  Severe MR-anterior.  Pulm V flow reversal.--> Post-OP: EF 40-50%.  No R WMA.  Mild LV dilation.  MV now repaired.  Trivial/no MR.  No MS (peak 7 mmHg, mean 55mHg).  Annular ring intact - no leak  . TRANSTHORACIC ECHOCARDIOGRAM  03/2019   EF 60-65% with significant posterior mitral valve leaflet prolapse with moderate severe MR.    . TRANSTHORACIC ECHOCARDIOGRAM  08/2016   EF 60 of 65%.  Severe LVH.  Pseudo-normal filling (grade 2 diastolic dysfunction).  MAC with calcified mobile attachments.  Posterior leaflet prolapse.  Mild regurgitation.  Moderate LA dilation.  . TRANSTHORACIC ECHOCARDIOGRAM  09/10/2019   s/p MVR:  LVEF 50-55%.  Low normal.  Mild concentric LVH.  Unable to assess diastolic function.  Mildly elevated PA P with mildly dilated PA.  Moderate LA dilation.  Mild-moderate MR but no MS.  Mean mitral gradient 3.5 mmHg at 79 bpm.  Prosthetic annuloplasty ring noted.   Mild aortic sclerosis with no stenosis.    reports that she quit smoking about 5 years ago. Her smoking use included cigarettes. She has a 48.00 pack-year smoking history. She has never used smokeless tobacco. She reports current alcohol use of about 2.0 standard drinks of alcohol per week. She reports that she does not use drugs. family history includes COPD in her brother; Colon cancer in her father and maternal grandfather; Dementia in her brother; Heart attack in her brother and mother; Heart disease in her mother; Pancreatic cancer in her father. Allergies  Allergen Reactions  . Iodine Anaphylaxis  . Iohexol Anaphylaxis     Desc: Patient states she is allergic to iodine and had a "code blue" incident after an injection for some type of imaging study.   . Penicillins Rash  PCN IN LARGE DOSES. Did it involve swelling of the face/tongue/throat, SOB, or low BP? No Did it involve sudden or severe rash/hives, skin peeling, or any reaction on the inside of your mouth or nose? No Did you need to seek medical attention at a hospital or doctor's office? No When did it last happen? More than 10 years ago If all above answers are "NO", may proceed with cephalosporin use.       Outpatient Encounter Medications as of 12/05/2019  Medication Sig  . acetaminophen (TYLENOL) 325 MG tablet Take 2 tablets (650 mg total) by mouth every 6 (six) hours as needed (back pain).  Marland Kitchen amoxicillin (AMOXIL) 500 MG capsule Take 2 grams ( 4 capsules )   1 hour before dental work  . aspirin EC 81 MG tablet Take 81 mg by mouth at bedtime. Reported on 07/16/2015  . atorvastatin (LIPITOR) 40 MG tablet Take 40 mg by mouth at bedtime.   . Cyanocobalamin (VITAMIN B-12) 5000 MCG LOZG Take 5,000 mcg by mouth daily.  Marland Kitchen FLUoxetine (PROZAC) 20 MG capsule Take 20 mg by mouth daily at 6 PM. (1700)  . fluticasone (FLONASE) 50 MCG/ACT nasal spray Place 2 sprays into both nostrils at bedtime.   . furosemide (LASIX) 20 MG tablet  Take 1 tablet (20 mg total) by mouth daily as needed.  . hydroxychloroquine (PLAQUENIL) 200 MG tablet TAKE 1 TABLET(200 MG) BY MOUTH TWICE DAILY  . levothyroxine (SYNTHROID) 125 MCG tablet Take 125 mcg by mouth daily before breakfast.  . liothyronine (CYTOMEL) 5 MCG tablet Take 5 mcg by mouth daily.  . metoprolol tartrate (LOPRESSOR) 25 MG tablet TAKE 1/2 TABLET BY MOUTH EVERY MORNING AND 1 TABLET BY MOUTH EVERY EVENING. MAY TAKE EXTRA DOSE IF NEEDED  . moxifloxacin (VIGAMOX) 0.5 % ophthalmic solution   . Multiple Vitamin (MULTIVITAMIN WITH MINERALS) TABS tablet Take 1 tablet by mouth daily. Multivitamin Essentials  . omeprazole (PRILOSEC) 40 MG capsule Take 1 capsule (40 mg total) by mouth in the morning and at bedtime.  . potassium chloride (KLOR-CON) 10 MEQ tablet TAKE 1 TABLET(10 MEQ) BY MOUTH DAILY  . [DISCONTINUED] omeprazole (PRILOSEC) 40 MG capsule Take 40 mg by mouth in the morning and at bedtime.    No facility-administered encounter medications on file as of 12/05/2019.    REVIEW OF SYSTEMS  : All other systems reviewed and negative except where noted in the History of Present Illness.   PHYSICAL EXAM: BP 120/60   Pulse 80   Ht 5' 7.32" (1.71 m)   Wt 231 lb (104.8 kg)   SpO2 95%   BMI 35.83 kg/m  General: Well developed white female in no acute distress Head: Normocephalic and atraumatic Eyes:  Sclerae anicteric, conjunctiva pink. Ears: Normal auditory acuity Lungs: Clear throughout to auscultation; no W/R/R. Heart: Regular rate and rhythm; no M/R/G. Abdomen: Soft, non-distended.  BS present.  Mild TTP on the left side. Musculoskeletal: Symmetrical with no gross deformities  Skin: No lesions on visible extremities Extremities: No edema  Neurological: Alert oriented x 4, grossly non-focal Psychological:  Alert and cooperative. Normal mood and affect  ASSESSMENT AND PLAN: *GERD: Well-controlled on omeprazole 40 mg twice daily.  We will send prescription with enough  refills for a year. *Left-sided abdominal pain: Reports that it has been present for years.  More so with movement.  Possibly musculoskeletal source.  Doubt any serious cause due to longevity.  Discussed that we could consider CT scan.  She deferred for now.  CC:  No ref. provider found

## 2019-12-05 NOTE — Patient Instructions (Addendum)
If you are age 69 or older, your body mass index should be between 23-30. Your Body mass index is 35.83 kg/m. If this is out of the aforementioned range listed, please consider follow up with your Primary Care Provider.  If you are age 71 or younger, your body mass index should be between 19-25. Your Body mass index is 35.83 kg/m. If this is out of the aformentioned range listed, please consider follow up with your Primary Care Provider.   Refilled Omeprazole 40 mg twice daily.

## 2019-12-06 NOTE — Progress Notes (Signed)
Reviewed and agree with management plan.  Melana Hingle T. Zalayah Pizzuto, MD FACG [facility]  

## 2019-12-11 ENCOUNTER — Ambulatory Visit
Admission: RE | Admit: 2019-12-11 | Discharge: 2019-12-11 | Disposition: A | Discharge status: Home / Self Care | Payer: Medicare Other | Source: Ambulatory Visit | Attending: Internal Medicine | Admitting: Internal Medicine

## 2019-12-11 DIAGNOSIS — N183 Chronic kidney disease, stage 3 unspecified: Secondary | ICD-10-CM | POA: Diagnosis not present

## 2019-12-11 DIAGNOSIS — N1831 Chronic kidney disease, stage 3a: Secondary | ICD-10-CM

## 2020-01-17 DIAGNOSIS — Z1231 Encounter for screening mammogram for malignant neoplasm of breast: Secondary | ICD-10-CM | POA: Diagnosis not present

## 2020-01-17 DIAGNOSIS — Z124 Encounter for screening for malignant neoplasm of cervix: Secondary | ICD-10-CM | POA: Diagnosis not present

## 2020-01-17 DIAGNOSIS — Z6836 Body mass index (BMI) 36.0-36.9, adult: Secondary | ICD-10-CM | POA: Diagnosis not present

## 2020-01-17 DIAGNOSIS — Z779 Other contact with and (suspected) exposures hazardous to health: Secondary | ICD-10-CM | POA: Diagnosis not present

## 2020-01-24 DIAGNOSIS — N183 Chronic kidney disease, stage 3 unspecified: Secondary | ICD-10-CM | POA: Diagnosis not present

## 2020-01-24 DIAGNOSIS — M359 Systemic involvement of connective tissue, unspecified: Secondary | ICD-10-CM | POA: Diagnosis not present

## 2020-01-24 DIAGNOSIS — R760 Raised antibody titer: Secondary | ICD-10-CM | POA: Diagnosis not present

## 2020-01-24 DIAGNOSIS — Z79899 Other long term (current) drug therapy: Secondary | ICD-10-CM | POA: Diagnosis not present

## 2020-01-25 DIAGNOSIS — L853 Xerosis cutis: Secondary | ICD-10-CM | POA: Diagnosis not present

## 2020-01-25 DIAGNOSIS — L700 Acne vulgaris: Secondary | ICD-10-CM | POA: Diagnosis not present

## 2020-01-25 DIAGNOSIS — L910 Hypertrophic scar: Secondary | ICD-10-CM | POA: Diagnosis not present

## 2020-01-25 DIAGNOSIS — L821 Other seborrheic keratosis: Secondary | ICD-10-CM | POA: Diagnosis not present

## 2020-04-03 DIAGNOSIS — L821 Other seborrheic keratosis: Secondary | ICD-10-CM | POA: Diagnosis not present

## 2020-04-03 DIAGNOSIS — L649 Androgenic alopecia, unspecified: Secondary | ICD-10-CM | POA: Diagnosis not present

## 2020-04-03 DIAGNOSIS — L570 Actinic keratosis: Secondary | ICD-10-CM | POA: Diagnosis not present

## 2020-04-04 ENCOUNTER — Telehealth: Payer: Self-pay | Admitting: Cardiology

## 2020-04-04 MED ORDER — AMOXICILLIN 500 MG PO CAPS: ORAL_CAPSULE | ORAL | 3 refills | Status: DC

## 2020-04-04 NOTE — Telephone Encounter (Signed)
Refill sent to the pharmacy electronically.  

## 2020-04-04 NOTE — Telephone Encounter (Signed)
*  STAT* If patient is at the pharmacy, call can be transferred to refill team.   1. Which medications need to be refilled? (please list name of each medication and dose if known) amoxicillin (AMOXIL) 500 MG capsule  2. Which pharmacy/location (including street and city if local pharmacy) is medication to be sent to? [place], [address]  3. Do they need a 30 day or 90 day supply? Patient is requesting 4 capsules

## 2020-04-07 DIAGNOSIS — H04123 Dry eye syndrome of bilateral lacrimal glands: Secondary | ICD-10-CM | POA: Diagnosis not present

## 2020-04-09 HISTORY — PX: TRANSTHORACIC ECHOCARDIOGRAM: SHX275

## 2020-04-10 ENCOUNTER — Other Ambulatory Visit: Payer: Self-pay

## 2020-04-10 ENCOUNTER — Ambulatory Visit (HOSPITAL_COMMUNITY): Payer: Medicare Other | Attending: Cardiology

## 2020-04-10 DIAGNOSIS — I2584 Coronary atherosclerosis due to calcified coronary lesion: Secondary | ICD-10-CM | POA: Insufficient documentation

## 2020-04-10 DIAGNOSIS — I340 Nonrheumatic mitral (valve) insufficiency: Secondary | ICD-10-CM | POA: Insufficient documentation

## 2020-04-10 DIAGNOSIS — Z9889 Other specified postprocedural states: Secondary | ICD-10-CM | POA: Diagnosis not present

## 2020-04-10 DIAGNOSIS — R002 Palpitations: Secondary | ICD-10-CM

## 2020-04-10 DIAGNOSIS — I2510 Atherosclerotic heart disease of native coronary artery without angina pectoris: Secondary | ICD-10-CM | POA: Diagnosis not present

## 2020-04-10 DIAGNOSIS — I5032 Chronic diastolic (congestive) heart failure: Secondary | ICD-10-CM | POA: Diagnosis not present

## 2020-04-10 LAB — ECHOCARDIOGRAM COMPLETE
Area-P 1/2: 2.42 cm2
MV VTI: 1.18 cm2
S' Lateral: 3.1 cm

## 2020-04-11 IMAGING — DX DG CHEST 1V PORT
1 series · 1 of 1 positions shown · non-contrast
Comparison: 04/13/2019 and earlier.

CLINICAL DATA: Postop day 1 mitral valve repair and clipping of the
LEFT atrial appendage.

EXAM:
PORTABLE CHEST 1 VIEW

[chest ap]
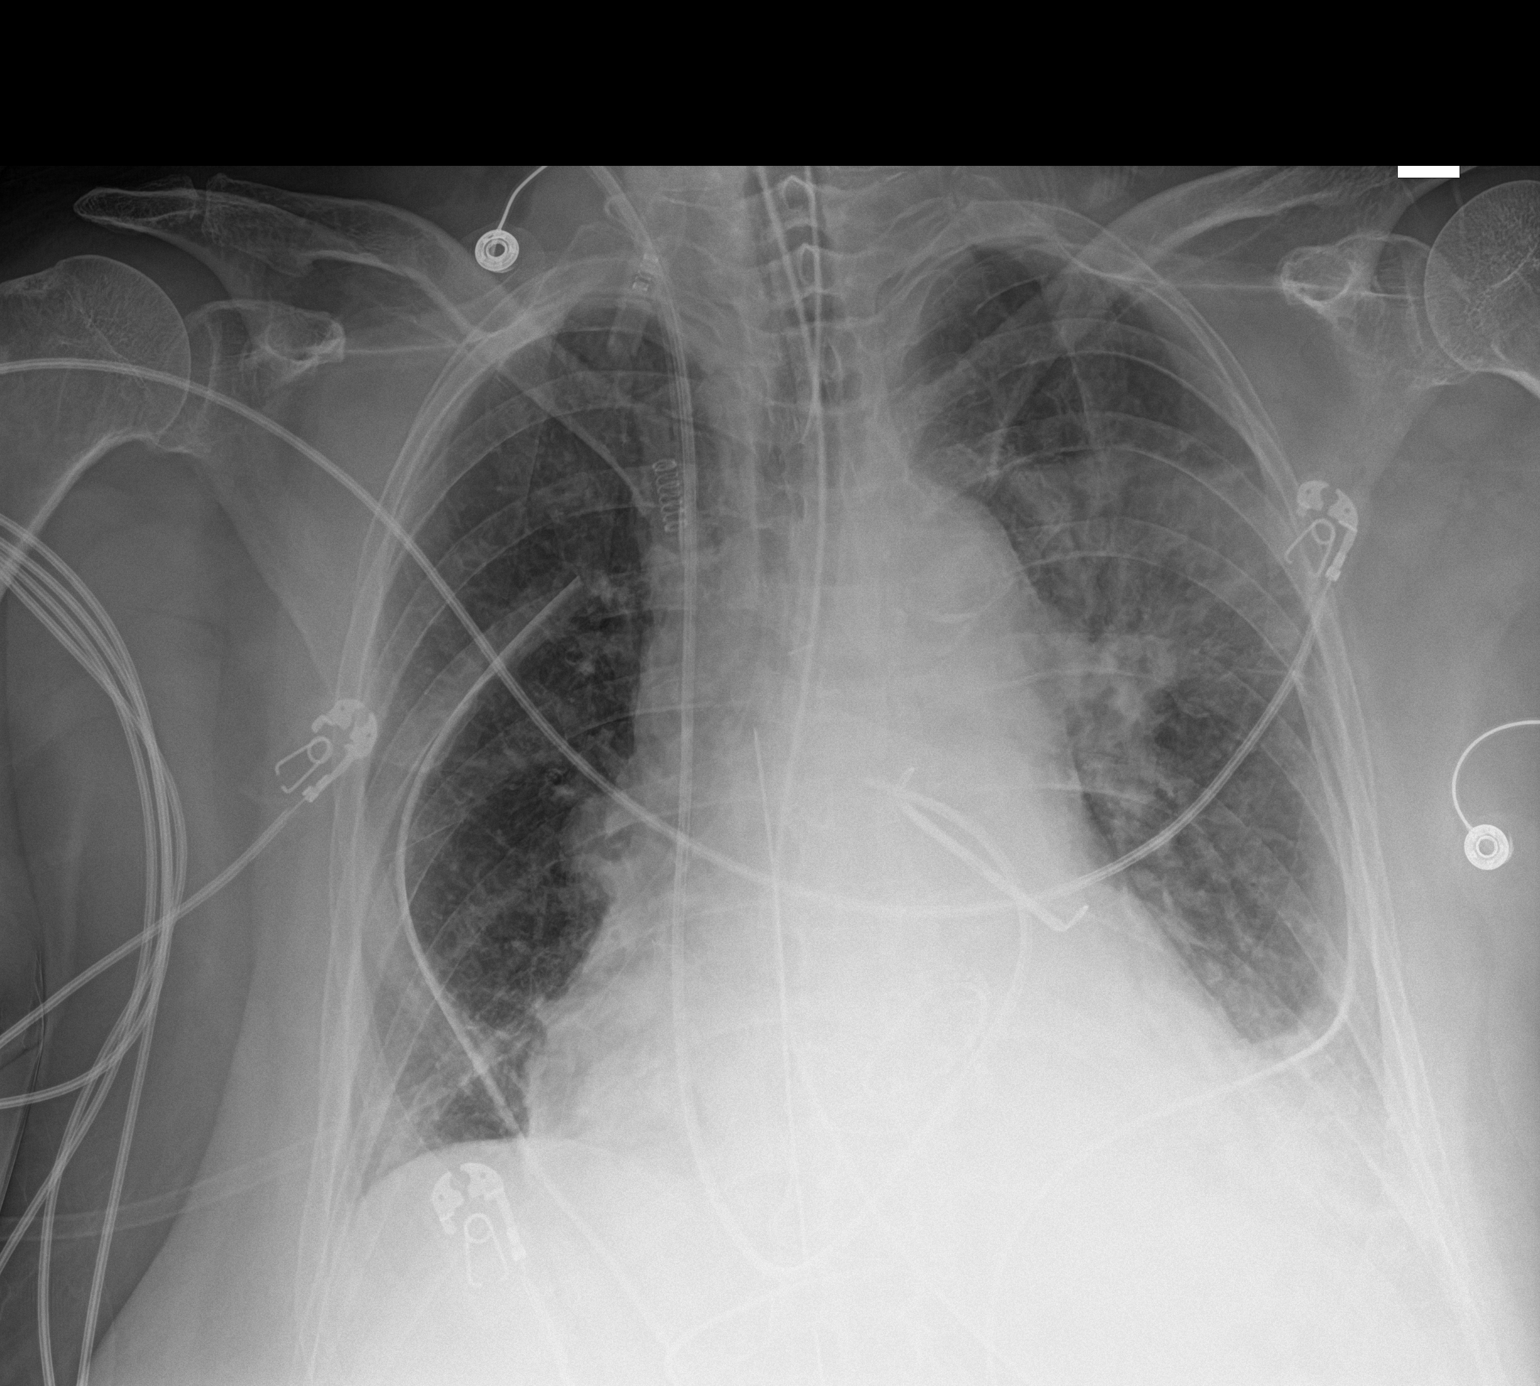

[1 of 1 positions shown; findings below may reference images not displayed]

FINDINGS: Endotracheal tube tip in satisfactory position projecting
approximately 6 cm above the carina. Swan-Ganz catheter tip projects
at the expected location the main pulmonary trunk. NG/OG tube
courses below the diaphragm into the stomach. BILATERAL chest tubes
remain in place with no pneumothorax.

Mitral valve annuloplasty and LEFT atrial appendage clipping.
Cardiac silhouette moderately enlarged, unchanged. New focal
airspace opacity in the LEFT UPPER LOBE. Stable dense consolidation
in the LEFT LOWER LOBE. Improved aeration in the RIGHT lung base.
IMPRESSION: 1. Support apparatus satisfactory.
2. No pneumothorax.
3. New atelectasis and/or pneumonia in the LEFT UPPER LOBE and
stable dense atelectasis in the LEFT LOWER LOBE. Improved aeration
in the RIGHT lung base.

## 2020-04-14 ENCOUNTER — Ambulatory Visit (INDEPENDENT_AMBULATORY_CARE_PROVIDER_SITE_OTHER): Payer: Medicare Other | Admitting: Cardiology

## 2020-04-14 ENCOUNTER — Encounter: Payer: Self-pay | Admitting: Cardiology

## 2020-04-14 ENCOUNTER — Other Ambulatory Visit: Payer: Self-pay

## 2020-04-14 VITALS — BP 125/77 | HR 67 | Ht 68.0 in | Wt 245.2 lb

## 2020-04-14 DIAGNOSIS — R002 Palpitations: Secondary | ICD-10-CM

## 2020-04-14 DIAGNOSIS — Z9889 Other specified postprocedural states: Secondary | ICD-10-CM | POA: Diagnosis not present

## 2020-04-14 DIAGNOSIS — R0600 Dyspnea, unspecified: Secondary | ICD-10-CM | POA: Diagnosis not present

## 2020-04-14 DIAGNOSIS — I493 Ventricular premature depolarization: Secondary | ICD-10-CM | POA: Diagnosis not present

## 2020-04-14 DIAGNOSIS — I340 Nonrheumatic mitral (valve) insufficiency: Secondary | ICD-10-CM | POA: Diagnosis not present

## 2020-04-14 DIAGNOSIS — G4733 Obstructive sleep apnea (adult) (pediatric): Secondary | ICD-10-CM

## 2020-04-14 DIAGNOSIS — I5032 Chronic diastolic (congestive) heart failure: Secondary | ICD-10-CM | POA: Diagnosis not present

## 2020-04-14 DIAGNOSIS — I2584 Coronary atherosclerosis due to calcified coronary lesion: Secondary | ICD-10-CM

## 2020-04-14 DIAGNOSIS — R0609 Other forms of dyspnea: Secondary | ICD-10-CM

## 2020-04-14 DIAGNOSIS — I2510 Atherosclerotic heart disease of native coronary artery without angina pectoris: Secondary | ICD-10-CM | POA: Diagnosis not present

## 2020-04-14 IMAGING — DX DG ABDOMEN 1V
1 series · 1 of 1 positions shown · non-contrast
Comparison: Two days ago

CLINICAL DATA: Orogastric tube placement

EXAM:
ABDOMEN - 1 VIEW

[abdomen]
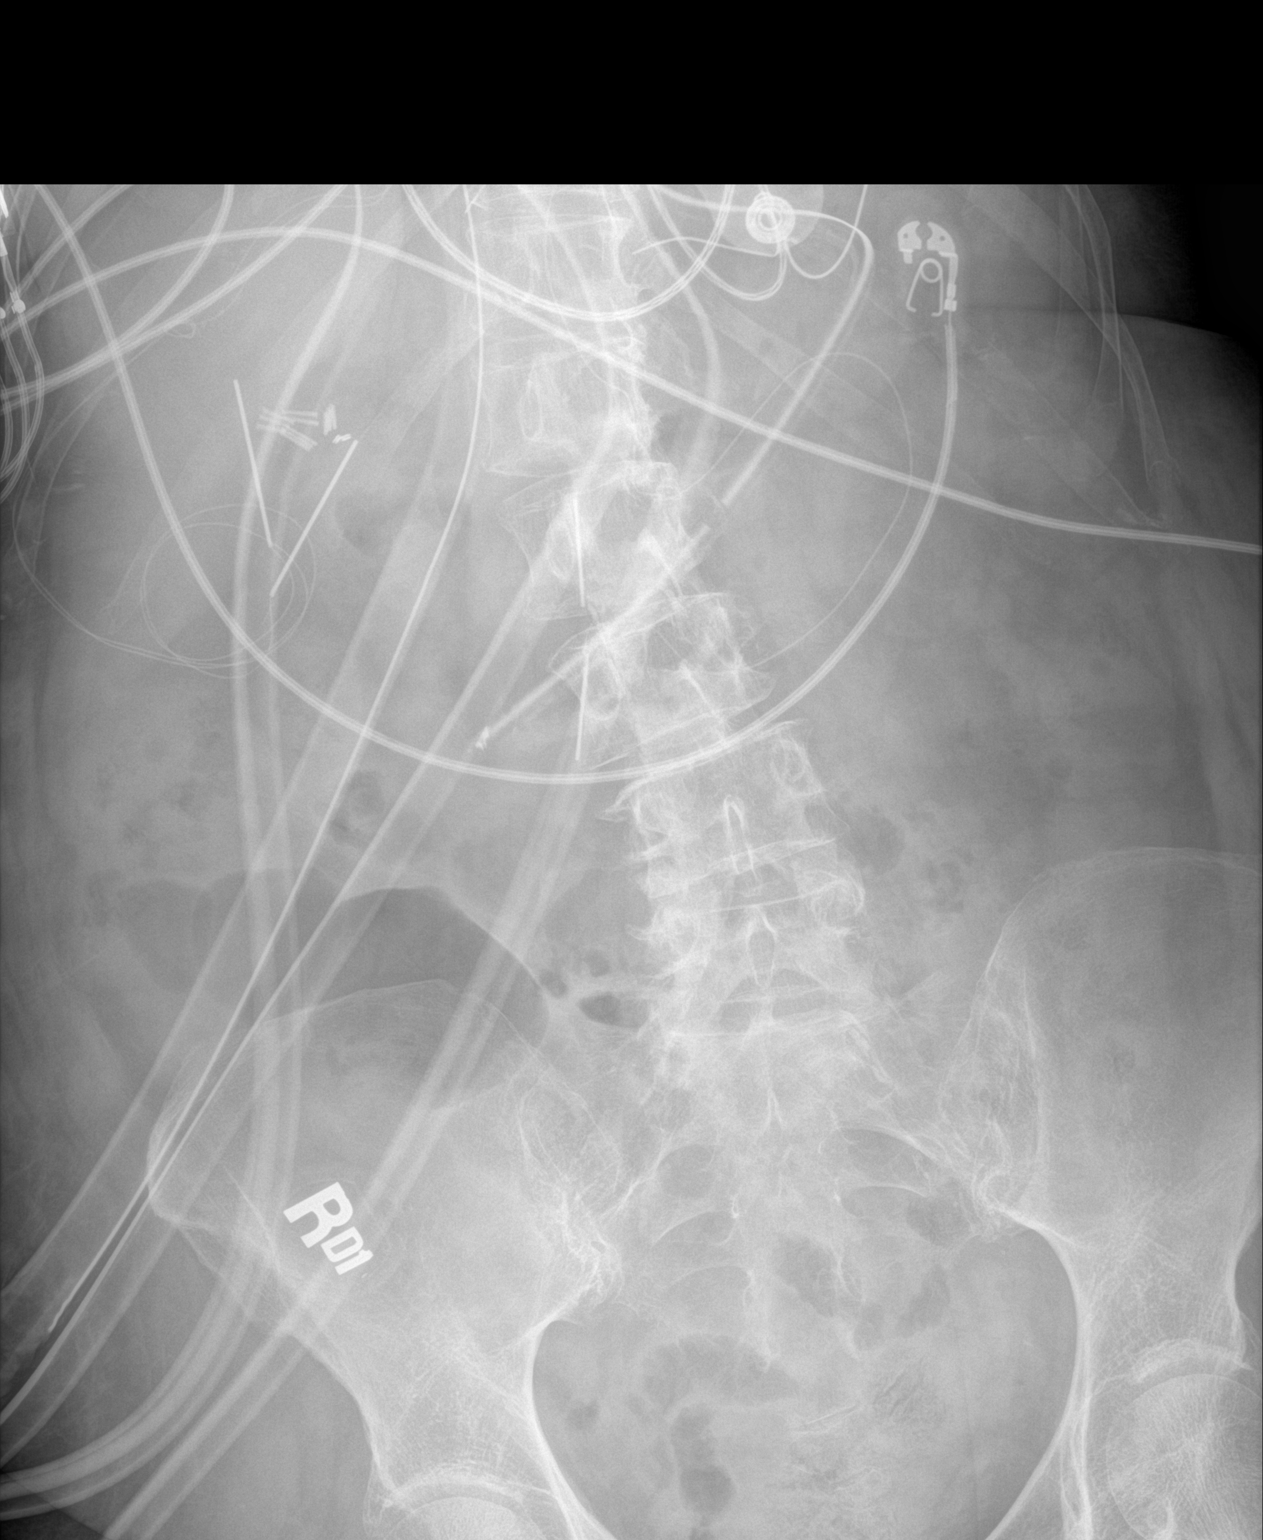

[1 of 1 positions shown; findings below may reference images not displayed]

FINDINGS: Orogastric tube with tip at the distal stomach. Cholecystectomy
clips.

Normal bowel gas pattern. No abnormal stool retention. Extensive
artifact from support apparatus.
IMPRESSION: 1. Orogastric tube tip is at the distal stomach.
2. Normal bowel gas pattern.

## 2020-04-14 NOTE — Patient Instructions (Signed)
Medication Instructions:   No changes *If you need a refill on your cardiac medications before your next appointment, please call your pharmacy*   Lab Work:  Not needed   Testing/Procedures:  Not needed  Follow-Up: At [facility], you and your health needs are our priority.  As part of our continuing mission to provide you with exceptional heart care, we have created designated Provider Care Teams.  These Care Teams include your primary Cardiologist (physician) and Advanced Practice Providers (APPs -  Physician Assistants and Nurse Practitioners) who all work together to provide you with the care you need, when you need it.     Your next appointment:   6 month(s)  The format for your next appointment:   In Person  Provider:   Glenetta Hew, MD   Other Instructions You have been referred to [facility] - for obstructive sleep apnea, dyspnea

## 2020-04-14 NOTE — Progress Notes (Signed)
Primary Care Provider: Garth Bigness (Inactive) Cardiologist: Glenetta Hew, MD Electrophysiologist: None  Clinic Note: Chief Complaint  Patient presents with  . Follow-up    6 months.  Mostly notes low energy levels and occasional shortness of breath.  . Mitral Valve Prolapse    Status post MVR last March.  Follow-up echocardiogram results shows stable findings.   ===================================  ASSESSMENT/PLAN   Problem List Items Addressed This Visit    Exertional dyspnea    With EF now back to normal and no real residual mitral disease postoperatively combined with normal coronary arteries by cath last year, it is difficult to say that her dyspnea is related to cardiac etiology.  Probably underlying pulmonary disease versus obesity and deconditioning.  Continue to follow-up with pulmonary medicine.      Relevant Orders   EKG 12-Lead (Completed)   Ambulatory referral to Pulmonology   Severe mitral regurgitation due to severe cusp prolapse (Chronic)    She had pretty rapid progression of disease became quite symptomatic.  Had severe mitral valve disease with MVP and regurgitation, now status post MVR.  EF stable, atrial sizes improved.  Pressure stable.  Follow-up echocardiogram shows normal functioning valve.      Relevant Orders   EKG 12-Lead (Completed)   Chronic diastolic heart failure (HCC) (Chronic)    Notably improved symptoms after MVR.  Remains on low-dose diuretic.  Has not required any afterload reduction.  Blood pressure looks pretty well controlled on current dose of metoprolol.  Continue low-dose furosemide with additional dosing as needed for worsening edema or dyspnea.Marland Kitchen      PVC's (premature ventricular contractions) (Chronic)    Overall stable.  Her beta-blocker dose has been reduced significantly and palpitations seem to be under control.  She is taking Toprol 5 mg in the morning and 25 mg in the evening with PRN additional dose.       Relevant Orders   EKG 12-Lead (Completed)   Palpitations (Chronic)    Totally resolved.  Remains on low-dose twice daily metoprolol.      Coronary artery calcification of native artery (Chronic)    She had negative Myoview and not normal coronaries on cardiac catheterization last year.  Restart medications with statin and beta-blocker along with aspirin.      Relevant Orders   EKG 12-Lead (Completed)   Obstructive sleep apnea (Chronic)    Need to continue follow-up with pulmonary medicine for CPAP.      S/P mitral valve repair - Primary (Chronic)    1 year out from very complex valvuloplasty for severe mitral prolapse with mitral regurgitation.  Follow-up echocardiogram shows stable results.  No MR or significant MS.  Would probably recheck echo at this time next year, then if stable, will check maybe every 3 years.  Plan: SBE prophylaxis--has standing prescription for amoxicillin 2 g 1 hour preop   Mild residual P HTN findings with mild diastolic function.    She has baseline fatigue and exertional dyspnea, likely related to deconditioning and lack of exercise.          ===================================  HPI:    Rebecca Carr is a 70 y.o. female with a PMH notable for history of MVR for Severe MITRAL REGURGITATION Along with Antiphospholipid Antibody Syndrome and COPD who presents today for 75-monthfollow-up.  CARDIAC HISTORY  Progressive MVP -> Severe /symptomatic MR noted in February 2021 --> identified as Barlow's Type Myxomatous Change Intra-Op  04/10/2019-TEE: EF 60 to 65%.  No R WMA.  Severe LA dilation.  Severe RA dilation.  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  Severe MR with eccentric anterior directed jet.  PV flow show systolic reversal. ; R&LHC - severely elevated PAP, PCP 33 mmHg with V wave of 62 mmHg from MR. -  04/10/3019: Admitted to Adv [facility] following Pre-Op R&LHC / TEE ---> Aggressively diuresed with inotropic support -> urgent  MVR  09/09/4479 -complicated MVR (Dr. Roxy Manns)  [place] Memo 4D ring Annuloplasty (34 mm), artificial Gore-Tex-neocord replacement x 6, LAA clipping.  ? Post-extubation acute respiratory failure - re-intubated --> IV diuresis, Inhaled NO --> post-op Afib - Rx with Amiodarone & Warfarin (BOTH subsequently stopped) ? TTE 09/10/19: EF 50-55%.  Mild-Mod MR- no MS. Stable annuloplasty ring.   Other diagnosis: Anticardiolipin antibody syndrome and COPD.  [name] was last seen on October 12, 2019 -> was doing well.  Was feeling a lot better.  Had both cataracts done.  Rarely feels palpitations.  Occasional pinprick chest discomfort.  Not walking as much as he used to.  Twice a month taking extra dose of Lasix.  Energy level notably improved.  Hoping to pick up exercise level. -> NO LONGER ON AMIODARONE OR WARFARIN.   Echocardiogram ordered.  Recent Hospitalizations: None  Reviewed  CV studies:    The following studies were reviewed today: (if available, images/films reviewed: From Epic Chart or Care Everywhere) . Echocardiogram 04/09/2020: Normal EF 55 to 60%.  No RWMA.  Mild basal septal LVH.  Unable to assess diastolic function.  Unable to assess PA P.  S/p MVR w/ 34 mm annuloplasty ring and artificial Gore-Tex neocord x6 --> Mild MR.  No MS.  Mean gradient 4 mmHg.  No AI.  Mild AoV sclerosis.  Normal atrial sizes.   Interval History:   Rebecca Carr presents here today for 90-month follow-up doing okay overall.  She has been off of warfarin and amiodarone since September, no bleeding issues or clots, and no recurrent episodes of palpitations.  For the most part she is doing okay, just notes lack of energy.  Occasional exercise intolerance/dyspnea.  She is gotten definitely out of shape, not exercising as much as he should.  She is off and on chest discomfort/tightness but not with exertion.  Mostly at rest.  Twinging symptoms.  Unfortunately, she is also not using CPAP that much. She is  successfully quit smoking, but is still chewing lots of the Nicorette gum.  No palpitations/rapid irregular heartbeats.  CV Review of Symptoms (Summary): positive for - dyspnea on exertion and Reduced energy level and exercise intolerance.  Intermittent musculoskeletal type chest discomfort. negative for - edema, irregular heartbeat, orthopnea, palpitations, paroxysmal nocturnal dyspnea, rapid heart rate, shortness of breath or Lightheadedness, dizziness or syncope/near syncope, TIA/amaurosis fugax, claudication   The patient does not have symptoms concerning for COVID-19 infection (fever, chills, cough, or new shortness of breath).   REVIEWED OF SYSTEMS   Review of Systems  Constitutional: Positive for malaise/fatigue. Negative for weight loss (She is actually gained weight.  Indicating sedentary behavior.).  HENT: Negative for congestion and nosebleeds.   Respiratory: Negative for cough and shortness of breath.   Cardiovascular: Positive for chest pain (Mild musculoskeletal symptoms.) and leg swelling (Stable, improved).  Gastrointestinal: Negative for blood in stool and melena.  Genitourinary: Negative for frequency and hematuria.  Musculoskeletal: Positive for joint pain. Negative for back pain and falls.  Skin:       The boil  on her back is been excised.  Neurological: Negative for dizziness, focal weakness and weakness (Feels generally weak).  Psychiatric/Behavioral: Negative for depression and memory loss. The patient is not nervous/anxious and does not have insomnia.     I have reviewed and (if needed) personally updated the patient's problem list, medications, allergies, past medical and surgical history, social and family history.   PAST MEDICAL HISTORY   Past Medical History:  Diagnosis Date  . Antiphospholipid syndrome ([place])   . Anxiety   . Arthritis   . Clotting disorder ([place])   . COPD (chronic obstructive pulmonary disease) ([place])   . DES exposure in utero   .  Diverticulosis of colon   . Dizziness   . Environmental and seasonal allergies   . GERD (gastroesophageal reflux disease)   . 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  Marland Kitchen Hashimoto's thyroiditis   . Hiatal hernia   . History of adenomatous polyp of colon    hyperplastic 2015  and tubular adenoma 2005  . History of esophageal dilatation    of stricture  . History of esophagitis    LA class , grade B and Gastritis  . History of pituitary tumor    dx 1990's - asymptomatic /  per pt last scan 2005 approx.  resolved   . Hyperlipidemia   . Hypothyroidism   . Leg pain, bilateral   . Mild intermittent asthma   . MTHFR mutation   . OSA (obstructive sleep apnea)    non-compliant cpap  . Prediabetes   . 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 SUI (stress urinary incontinence, female)   . VIN III (vulvar intraepithelial neoplasia III)   . Wears glasses     PAST SURGICAL HISTORY   Past Surgical History:  Procedure Laterality Date  . BUBBLE STUDY  04/10/2019   Procedure: BUBBLE STUDY;  Surgeon: Sueanne Margarita, MD;  Location: [facility] ENDOSCOPY;  Service: Cardiovascular;;  . CARDIAC EVENT MONITOR  08/2016   Normal sinus rhythm with sinus tachycardia.  Average heart rate 91 bpm.  Occasional PVCs.  One 12 and another 6 beat run of wide-complex tachycardia.  2 morphology PVCs.PVCs noted, but not during longer episode of WCT.  Marland Kitchen CESAREAN SECTION  1994  . CLIPPING OF ATRIAL APPENDAGE N/A 04/13/2019   Procedure: Clipping Of Atrial Appendage using AtriCure 45 MM AtriClip.;  Surgeon: Rexene Alberts, MD;  Location: [place];  Service: Open Heart Surgery;  Laterality: N/A;  . CO2 LASER APPLICATION N/A 5/42/7062   Procedure: CO2 LASER  APPLICATION ;  Surgeon: Everitt Amber, MD;  Location: [facility];  Service: Gynecology;  Laterality: N/A;  . COLONOSCOPY, ESOPHAGOGASTRODUODENOSCOPY (EGD) AND ESOPHAGEAL DILATION  10-04-2013  . LAPAROSCOPIC CHOLECYSTECTOMY  1995  . LASER ABLATION CONDOLAMATA N/A 07/03/2015   Procedure: CO2 LASER OF VULVA ;  Surgeon: Everitt Amber, MD;  Location: [facility];  Service: Gynecology;  Laterality: N/A;  . MITRAL VALVE REPAIR N/A 04/13/2019   Procedure: MITRAL VALVE REPAIR (MVR) using Memo 4D 34 MM Mitral Valve.;  Surgeon: Rexene Alberts, MD;  Location: [place];  Service: Open Heart Surgery;  Laterality: N/A;  . NM MYOVIEW LTD  10/2016   LOW RISK.  No ischemia or  infarction.  EF 54%.  Marland Kitchen RIGHT/LEFT HEART CATH AND CORONARY ANGIOGRAPHY N/A 04/10/2019   Procedure: RIGHT/LEFT HEART CATH AND CORONARY ANGIOGRAPHY;  Surgeon: Leonie Man, MD:: Angiographically normal coronary arteries.  Moderate-severe pulmonary hypertension with severe MR.  PAP-mean 67/27 mmHg - 44 mmHg, PCWP 26/62 mmHg-(V wave 62 mmHg).  RVP-EDP 66/14 mmHg - 22 mmHg, RAP mean 20 mmHg.  AoP-MAP 96/60 mmHg - 76 mmHg.  Fick CO-CI: 5.24-2.37. --> admitted for diuresis & MVR.  . TEE WITHOUT CARDIOVERSION N/A 04/10/2019   Procedure: TRANSESOPHAGEAL ECHOCARDIOGRAM (TEE);  Surgeon: Sueanne Margarita, MD;  Location: [facility] ENDOSCOPY::  EF 60 to 65%.  No R WMA.  Mildly reduced RV function.  Severe LA dilation.  Severe RA dilation.  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  Severe MR with eccentric anterior directed jet.  PV flow show systolic reversal.  Mild to moderate TR.  Mild AoV sclerosis w/o AS  . TEE WITHOUT CARDIOVERSION N/A 04/13/2019   Procedure: TRANSESOPHAGEAL ECHOCARDIOGRAM (TEE);  Surgeon: Rexene Alberts, MD;  Location: [facility] OR: EF > 65% Preop-myxomatous MV with severe prolapse -flail P2 leaflet w/ ruptured chordae.  Severe MR-anterior.  Pulm V flow reversal.--> Post-OP: EF 40-50%.  No R WMA.  Mild LV dilation.  MV  now repaired.  Trivial/no MR.  No MS (peak 7 mmHg, mean 60mmHg).  Annular ring intact - no leak  . TRANSTHORACIC ECHOCARDIOGRAM  03/2019   EF 60 to 65%.  Elevated LAP and LVEDP.  GRII DD.  Severe LA dilation and moderate RA dilation.  Severe myxomatous proliferation of posterior MV leaflet with moderate calcification.  Anterior MV is mildly thickened.  Significant MVP of posterior leaflet at least moderate if not severe MR, directed towards IAS.  Mild MS-MVG of 5 mmHg.  Dilated IVC w/o Respiratory variability - RAP ~15 mmHg  . TRANSTHORACIC ECHOCARDIOGRAM  04/09/2020   Normal EF 55 to 60%.  Normal wall motion.  Mild basal septal LVH, normal atrial sizes. Unable to assess diastolic function or PAP. s/p MVR with 34 mm annuloplasty ring and artificial neocord x 6.  Mild MR with no MS (mean gradient 4 mmHg).  No AI.  Mild aortic valve sclerosis...  . TRANSTHORACIC ECHOCARDIOGRAM  09/10/2019   s/p MVR:  LVEF 50-55%.  Low normal.  Mild conc LVH.  Unable to assess DFxn.  Mildly elevated PA P with mildly dilated PA.  Moderate LA dilation.  Mild-moderate MR but no MS.  Mean MVG 3.5 mmHg at 79 bpm.  Prosthetic annuloplasty ring noted.  Mild aortic sclerosis with no stenosis.   . TRANSTHORACIC ECHOCARDIOGRAM  04/09/2020   1 yr Post-OP MVR:  Normal EF 55 to 60%.  No RWMA.  Mild basal septal LVH.  Unable to assess diastolic function.  Unable to assess PA P.  S/p MVR w/ 34 mm annuloplasty ring and artificial Gore-Tex neocord x6 --> Mild MR.  No MS.  Mean gradient 4 mmHg.  No AI.  Mild AoV sclerosis.    Immunization History  Administered Date(s) Administered  . H1N1 11/29/2007  . Influenza, High Dose Seasonal PF 12/24/2015, 11/03/2016, 11/03/2016, 10/21/2017, 10/26/2018  . Pneumococcal Conjugate-13 10/02/2015, 03/24/2018  . Pneumococcal Polysaccharide-23 11/08/2013  . Tdap 08/09/2011  . Zoster 10/21/2015    MEDICATIONS/ALLERGIES   Current Meds  Medication Sig  . amoxicillin (AMOXIL) 500 MG capsule Take 2  grams ( 4 capsules )   1 hour before dental work  . aspirin EC 81 MG tablet Take 81 mg by  mouth at bedtime. Reported on 07/16/2015  . atorvastatin (LIPITOR) 40 MG tablet Take 40 mg by mouth at bedtime.   Marland Kitchen azelastine (ASTELIN) 0.1 % nasal spray Place 1 spray into both nostrils 2 (two) times daily.  . Cyanocobalamin (VITAMIN B-12) 5000 MCG LOZG Take 5,000 mcg by mouth daily.  Marland Kitchen FLUoxetine (PROZAC) 20 MG capsule Take 20 mg by mouth daily at 6 PM. (1700)  . fluticasone (FLONASE) 50 MCG/ACT nasal spray Place 2 sprays into both nostrils at bedtime.   . furosemide (LASIX) 20 MG tablet Take 1 tablet (20 mg total) by mouth daily as needed.  . hydroxychloroquine (PLAQUENIL) 200 MG tablet See admin instructions.  Marland Kitchen levothyroxine (SYNTHROID) 125 MCG tablet Take 125 mcg by mouth daily before breakfast.  . liothyronine (CYTOMEL) 5 MCG tablet Take 5 mcg by mouth daily.  . metoprolol tartrate (LOPRESSOR) 25 MG tablet TAKE 1/2 TABLET BY MOUTH EVERY MORNING AND 1 TABLET BY MOUTH EVERY EVENING. MAY TAKE EXTRA DOSE IF NEEDED  . Multiple Vitamin (MULTIVITAMIN WITH MINERALS) TABS tablet Take 1 tablet by mouth daily. Multivitamin Essentials  . omeprazole (PRILOSEC) 40 MG capsule Take 1 capsule (40 mg total) by mouth in the morning and at bedtime.  . potassium chloride (KLOR-CON) 10 MEQ tablet TAKE 1 TABLET(10 MEQ) BY MOUTH DAILY  . [DISCONTINUED] acetaminophen (TYLENOL) 325 MG tablet Take 2 tablets (650 mg total) by mouth every 6 (six) hours as needed (back pain).  . [DISCONTINUED] moxifloxacin (VIGAMOX) 0.5 % ophthalmic solution     Allergies  Allergen Reactions  . Iodine Anaphylaxis  . Iohexol Anaphylaxis     Desc: Patient states she is allergic to iodine and had a "code blue" incident after an injection for some type of imaging study.   . Penicillins Rash    PCN IN LARGE DOSES. Did it involve swelling of the face/tongue/throat, SOB, or low BP? No Did it involve sudden or severe rash/hives, skin peeling, or  any reaction on the inside of your mouth or nose? No Did you need to seek medical attention at a hospital or doctor's office? No When did it last happen? More than 10 years ago If all above answers are "NO", may proceed with cephalosporin use.     SOCIAL HISTORY/FAMILY HISTORY   Reviewed in Epic:  Pertinent findings:  Social History   Tobacco Use  . Smoking status: Former Smoker    Packs/day: 1.00    Years: 48.00    Pack years: 48.00    Types: Cigarettes    Quit date: 05/10/2014    Years since quitting: 6.0  . Smokeless tobacco: Never Used  . Tobacco comment: using nicorrette  Vaping Use  . Vaping Use: Never used  Substance Use Topics  . Alcohol use: Yes    Alcohol/week: 2.0 standard drinks    Types: 2 Standard drinks or equivalent per week    Comment: Social - > is now essentially quit  . Drug use: No   Social History   Social History Narrative   No routine exercise. Describes herself as "lazy "    OBJCTIVE -PE, EKG, labs   Wt Readings from Last 3 Encounters:  04/21/20 243 lb 12.8 oz (110.6 kg)  04/14/20 245 lb 3.2 oz (111.2 kg)  12/05/19 231 lb (104.8 kg)    Physical Exam: BP 125/77   Pulse 67   Ht $R'5\' 8"'Ks$  (1.727 m)   Wt 245 lb 3.2 oz (111.2 kg)   SpO2 94%   BMI 37.28 kg/m  Physical Exam Vitals reviewed.  Constitutional:      General: She is not in acute distress.    Appearance: Normal appearance. She is obese. She is not ill-appearing or toxic-appearing.  HENT:     Head: Normocephalic and atraumatic.  Neck:     Vascular: No carotid bruit, hepatojugular reflux or JVD.  Cardiovascular:     Rate and Rhythm: Normal rate and regular rhythm.  No extrasystoles are present.    Chest Wall: PMI is not displaced.     Pulses: Normal pulses.     Heart sounds: S1 normal and S2 normal. Heart sounds are distant. No murmur heard. No friction rub. No gallop.   Pulmonary:     Effort: Pulmonary effort is normal. No respiratory distress.     Breath sounds: Normal  breath sounds.  Chest:     Chest wall: No tenderness.  Musculoskeletal:        General: Swelling (Trivial bilateral ankle edema) present. Normal range of motion.     Cervical back: Normal range of motion and neck supple.  Skin:    General: Skin is warm and dry.  Neurological:     General: No focal deficit present.     Mental Status: She is alert and oriented to person, place, and time.     Motor: No weakness.  Psychiatric:        Mood and Affect: Mood normal.        Behavior: Behavior normal.        Thought Content: Thought content normal.        Judgment: Judgment normal.        Adult ECG Report  Rate: 67 ;  Rhythm: normal sinus rhythm; normal axis, intervals and durations.  Narrative Interpretation: Normal EKG  Recent Labs: Reviewed in Care Everywhere. [facility] Related to Comprehensive Metabolic Panel Component 26/37/85 10/08/19 08/29/19 07/19/19 06/21/19 10/26/18  Glucose 88 156High 127High 128High 106High 86  BUN _0 Creatinine 1.15 1.17High 1.17High 1.24High 1.29High 0.94  eGFR If NonAfrican American 49 48Low 48Low 45Low 43Low 63  eGFR If African American 56  55Low  55Low  52Low  49Low  72  BUN/Creatinine Ratio _1 Sodium 139 140 139 139 139 139  Potassium 4.8 4.2 4.5 4.2 4.5 4.8  Chloride 101 102 99 100 99 103  CO2 _2 CALCIUM 9.4 9.3 9.2 9.0 9.2 9.6  Total Protein 7.2 -- -- 7.1 7.4 7.0  Albumin, Serum 4.4 -- -- 4.3 4.5 4.4  Globulin, Total 2.8 -- -- 2.8 2.9 2.6  Total Bilirubin 0.6 -- -- 0.4 0.4 0.7  Alkaline Phosphatase 103  -- -- 156High 141High 105  AST 27 -- -- 41High 21 25  ALT (SGPT) 27 -- -- 39High 18 31   10/08/2019: Anti dsDNA antibody <8.0 (normal range); Complements:  C3 143 (normal), C4 29 (normal)  07/19/2019: ESR 53 (high); CRP 4 (normal)  Cardiolipin IgG, IgA, IgM Component 10/12/18 02/09/18  Anticardiolipin Ab,Igg,Qn <9  <9   Anticardiolipin  Ab,IgM,Qn 26  37High   Anticardiolipin Ab,IgA,Qn <9  <9    No results found for: CHOL, HDL, LDLCALC, LDLDIRECT, TRIG, CHOLHDL Lab Results  Component Value Date   CREATININE 1.15 (H) 05/10/2019   BUN 22 05/10/2019   NA 143 05/10/2019   K 4.8 05/10/2019   CL 103 05/10/2019   CO2 24 05/10/2019   CBC Latest Ref Rng & Units 05/10/2019  04/24/2019 04/21/2019  WBC 3.4 - 10.8 x10E3/uL 10.0 16.2(H) 15.5(H)  Hemoglobin 11.1 - 15.9 g/dL 9.4(L) 8.3(L) 9.1(L)  Hematocrit 34.0 - 46.6 % 30.1(L) 26.7(L) 29.1(L)  Platelets 150 - 450 x10E3/uL 314 199 199    Lab Results  Component Value Date   TSH 5.506 (H) 04/16/2019    ==================================================  COVID-19 Education: The signs and symptoms of COVID-19 were discussed with the patient and how to seek care for testing (follow up with PCP or arrange E-visit).   The importance of social distancing and COVID-19 vaccination was discussed today. The patient is practicing social distancing & Masking.   I spent a total of 56mnutes with the patient spent in direct patient consultation.  Additional time spent with chart review  / charting (studies, outside notes, etc): 12 min Total Time: 37 min   Current medicines are reviewed at length with the patient today.  (+/- concerns) N/A  This visit occurred during the SARS-CoV-2 public health emergency.  Safety protocols were in place, including screening questions prior to the visit, additional usage of staff PPE, and extensive cleaning of exam room while observing appropriate contact time as indicated for disinfecting solutions.  Notice: This dictation was prepared with Dragon dictation along with smaller phrase technology. Any transcriptional errors that result from this process are unintentional and may not be corrected upon review.  Patient Instructions / Medication Changes & Studies & Tests Ordered   Patient Instructions  Medication Instructions:   No changes *If you need a  refill on your cardiac medications before your next appointment, please call your pharmacy*   Lab Work:  Not needed   Testing/Procedures:  Not needed  Follow-Up: [facility].  As part of our continuing mission to provide you with exceptional heart care, we have created designated Provider Care Teams.  These Care Teams include your primary Cardiologist (physician) and Advanced Practice Providers (APPs -  Physician Assistants and Nurse Practitioners) who all work together to provide you with the care you need, when you need it.     Your next appointment:   6 month(s)  The format for your next appointment:   In Person  Provider:   DGlenetta Hew MD   Other Instructions [facility], dyspnea    Studies Ordered:   Orders Placed This Encounter  Procedures  . Ambulatory referral to Pulmonology  . EKG 12-Lead     DGlenetta Hew M.D., M.S. Interventional Cardiologist   Pager # [phone] # [phone] [place] [address]  [facility]!

## 2020-04-19 IMAGING — DX DG CHEST 1V
1 series · 1 of 1 positions shown · non-contrast
Comparison: 04/20/2019

CLINICAL DATA: Postoperative

EXAM:
CHEST  1 VIEW

[chest]
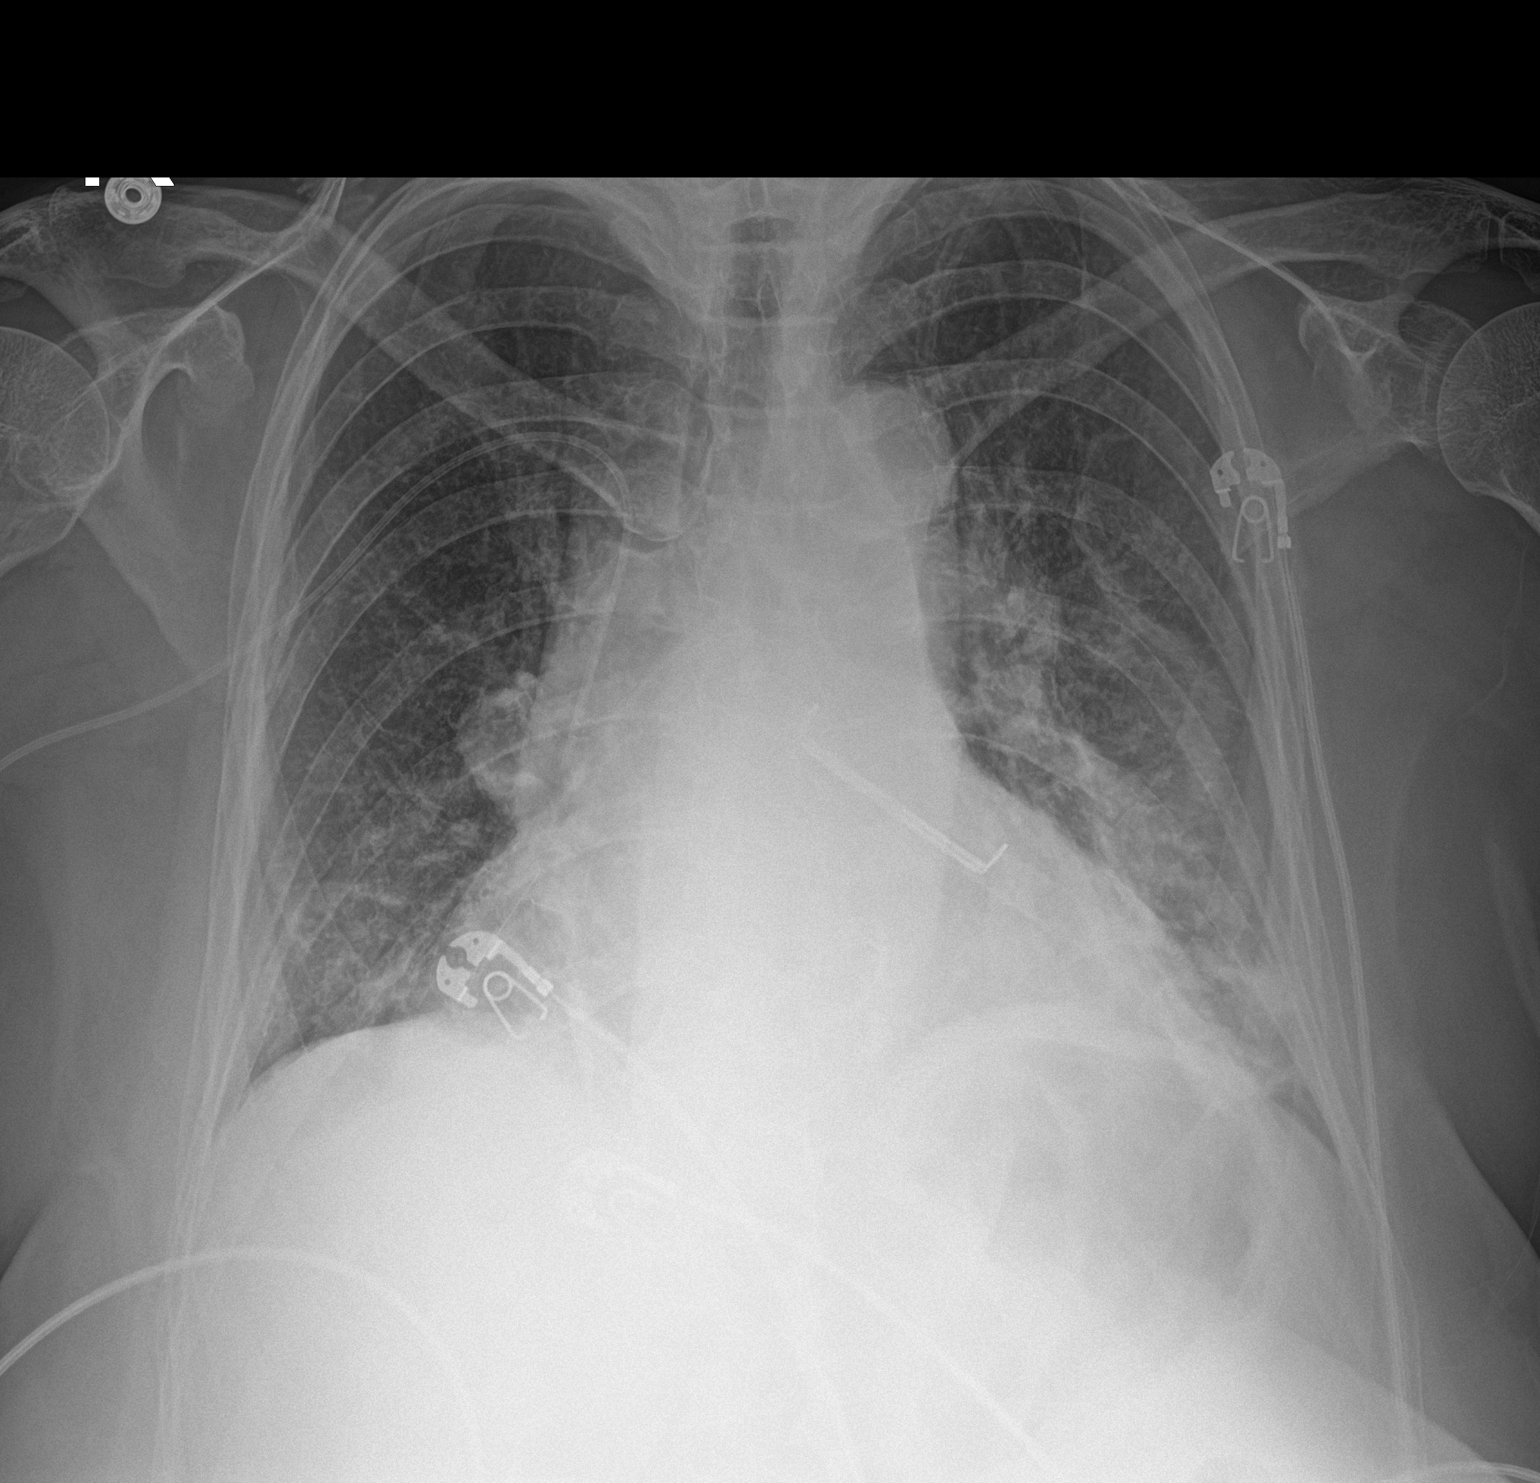

[1 of 1 positions shown; findings below may reference images not displayed]

FINDINGS: Slight interval improvement in heterogeneous airspace opacity of the
lung bases, which may reflect improved atelectasis. Cardiomegaly
with mitral valve prosthesis and left atrial appendage clip. Right
upper extremity PICC.
IMPRESSION: Slight interval improvement in bibasilar airspace opacities, which
may reflect improved atelectasis.

## 2020-04-21 ENCOUNTER — Other Ambulatory Visit: Payer: Self-pay

## 2020-04-21 ENCOUNTER — Ambulatory Visit (INDEPENDENT_AMBULATORY_CARE_PROVIDER_SITE_OTHER): Payer: Medicare Other | Admitting: Thoracic Surgery (Cardiothoracic Vascular Surgery)

## 2020-04-21 ENCOUNTER — Encounter: Payer: Self-pay | Admitting: Thoracic Surgery (Cardiothoracic Vascular Surgery)

## 2020-04-21 VITALS — BP 142/83 | HR 75 | Temp 97.7°F | Resp 20 | Ht 68.0 in | Wt 243.8 lb

## 2020-04-21 DIAGNOSIS — I2584 Coronary atherosclerosis due to calcified coronary lesion: Secondary | ICD-10-CM | POA: Diagnosis not present

## 2020-04-21 DIAGNOSIS — Z9889 Other specified postprocedural states: Secondary | ICD-10-CM

## 2020-04-21 DIAGNOSIS — I2510 Atherosclerotic heart disease of native coronary artery without angina pectoris: Secondary | ICD-10-CM

## 2020-04-21 NOTE — Patient Instructions (Signed)

## 2020-04-21 NOTE — Progress Notes (Signed)
[address]       [place] [address]             [phone]     CARDIOTHORACIC SURGERY OFFICE NOTE  Primary Cardiologist is Glenetta Hew, MD PCP is Aurea Graff, PA-C (Inactive)   HPI:  Patient is a 70 year old obese female with multiple medical problems including lupus who underwent mitral valve repair on April 13, 2019 for mitral valve prolapse with severe symptomatic primary mitral regurgitation.  The patient's early postoperative recovery was notable for postoperative atrial fibrillation which converted back to sinus rhythm on amiodarone.  She was discharged from the hospital in sinus rhythm on long-term warfarin anticoagulation.  She was last seen here in our office on August 20, 2019 at which time she was doing well.  Since then she has been followed carefully by Dr. Ellyn Hack.  She is no longer taking amiodarone or Coumadin.  Recent follow-up echocardiogram performed April 10, 2020 revealed normal left ventricular systolic function with intact mitral valve repair and only mild residual mitral regurgitation.  Patient reports that overall she is doing exceptionally well.  She feels "much improved" in comparison with her she felt prior to surgery.  She states that she only gets short of breath with strenuous physical exertion and this does not limit her to any significant degree.  She has been having some increased pain recently from her lupus.  Overall she is very pleased with her outcome.   Current Outpatient Medications  Medication Sig Dispense Refill  . acetaminophen (TYLENOL) 325 MG tablet Take 2 tablets (650 mg total) by mouth every 6 (six) hours as needed (back pain).    Marland Kitchen amoxicillin (AMOXIL) 500 MG capsule Take 2 grams ( 4 capsules )   1 hour before dental work 4 capsule 3  . aspirin EC 81 MG tablet Take 81 mg by mouth at bedtime. Reported on 07/16/2015    . atorvastatin (LIPITOR) 40 MG tablet Take 40 mg by mouth at bedtime.     Marland Kitchen azelastine (ASTELIN) 0.1  % nasal spray Place 1 spray into both nostrils 2 (two) times daily.    . Cyanocobalamin (VITAMIN B-12) 5000 MCG LOZG Take 5,000 mcg by mouth daily.    Marland Kitchen FLUoxetine (PROZAC) 20 MG capsule Take 20 mg by mouth daily at 6 PM. (1700)    . fluticasone (FLONASE) 50 MCG/ACT nasal spray Place 2 sprays into both nostrils at bedtime.     . furosemide (LASIX) 20 MG tablet Take 1 tablet (20 mg total) by mouth daily as needed. 90 tablet 3  . hydroxychloroquine (PLAQUENIL) 200 MG tablet See admin instructions.    Marland Kitchen levothyroxine (SYNTHROID) 125 MCG tablet Take 125 mcg by mouth daily before breakfast.    . liothyronine (CYTOMEL) 5 MCG tablet Take 5 mcg by mouth daily.    . metoprolol tartrate (LOPRESSOR) 25 MG tablet TAKE 1/2 TABLET BY MOUTH EVERY MORNING AND 1 TABLET BY MOUTH EVERY EVENING. MAY TAKE EXTRA DOSE IF NEEDED 160 tablet 3  . moxifloxacin (VIGAMOX) 0.5 % ophthalmic solution     . Multiple Vitamin (MULTIVITAMIN WITH MINERALS) TABS tablet Take 1 tablet by mouth daily. Multivitamin Essentials    . omeprazole (PRILOSEC) 40 MG capsule Take 1 capsule (40 mg total) by mouth in the morning and at bedtime. 180 capsule 4  . potassium chloride (KLOR-CON) 10 MEQ tablet TAKE 1 TABLET(10 MEQ) BY MOUTH DAILY 90 tablet 3   No current facility-administered medications for  this visit.      Physical Exam:   There were no vitals taken for this visit.  General:  Well-appearing  Chest:   Clear to auscultation  CV:   Regular rate and rhythm without murmur  Incisions:  Completely healed  Abdomen:  Soft nontender  Extremities:  Warm and well-perfused  Diagnostic Tests:   ECHOCARDIOGRAM REPORT       Patient Name:  Rebecca Carr Date of Exam: 04/10/2020  Medical Rec #: [id number]    Height:    67.3 in  Accession #:  [id number]    Weight:    231.0 lb  Date of Birth: 1950-09-07    BSA:     2.157 m  Patient Age:  70 years     BP:      125/79 mmHg  Patient Gender: F         HR:      69 bpm.  Exam Location: [facility]   Procedure: 2D Echo, 3D Echo, Cardiac Doppler and Color Doppler   Indications:  I05.9 MVD    History:    Patient has prior history of Echocardiogram examinations,  most         recent 09/10/2019. CHF, COPD, Signs/Symptoms:Palpitations  and         Dizziness/Lightheadedness; Risk Factors:Sleep Apnea and  HLD.         04/13/2019: MVR - Sorin Memo 4D ring Annuloplasty (34 mm),         artificial Gore-Tex-neocord replacement x 6, LAA clipping.           Mitral Valve: 38 Sorin Memo 4D annuloplasty Ring valve is         present in the mitral position. Procedure Date: 04/13/2019.    Sonographer:  Marygrace Drought RCS  Referring Phys: [place]    1. Left ventricular ejection fraction, by estimation, is 55 to 60%. The  left ventricle has normal function. The left ventricle has no regional  wall motion abnormalities. There is mild left ventricular hypertrophy of  the basal-septal segment. Left  ventricular diastolic function could not be evaluated.  2. Right ventricular systolic function is normal. The right ventricular  size is normal. Tricuspid regurgitation signal is inadequate for assessing  PA pressure.  3. The mitral valve has been repaired/replacedS/P Sorin Memo 4D ring  Annuloplasty (34 mm) and artificial Gore-Tex-neocord replacement x 6. Mild  mitral valve regurgitation. No evidence of mitral stenosis. The mean  mitral valve gradient is 4.0 mmHg. There  is a 53 Sorin Memo 4D annuloplasty Ring present in the mitral position.  Procedure Date: 04/13/2019.  4. The aortic valve is tricuspid. Aortic valve regurgitation is not  visualized. Mild aortic valve sclerosis is present, with no evidence of  aortic valve stenosis.  5. The inferior vena cava is normal in size with greater than 50%  respiratory variability, suggesting right  atrial pressure of 3 mmHg.   FINDINGS  Left Ventricle: Left ventricular ejection fraction, by estimation, is 55  to 60%. The left ventricle has normal function. The left ventricle has no  regional wall motion abnormalities. The left ventricular internal cavity  size was normal in size. There is  mild left ventricular hypertrophy of the basal-septal segment. Left  ventricular diastolic function could not be evaluated due to mitral valve  repair. Left ventricular diastolic function could not be evaluated.   Right Ventricle: The right ventricular size is normal. No increase in  right ventricular wall  thickness. Right ventricular systolic function is  normal. Tricuspid regurgitation signal is inadequate for assessing PA  pressure.   Left Atrium: Left atrial size was normal in size.   Right Atrium: Right atrial size was normal in size.   Pericardium: There is no evidence of pericardial effusion.   Mitral Valve: S/P Sorin Memo 4D ring Annuloplasty (34 mm) and artificial  Gore-Tex-neocord replacement x 6. The mitral valve has been  repaired/replaced. Mild mitral valve regurgitation. There is a 5 Sorin  Memo 4D annuloplasty Ring present in the mitral  position. Procedure Date: 04/13/2019. No evidence of mitral valve stenosis.  MV peak gradient, 9.4 mmHg. The mean mitral valve gradient is 4.0 mmHg.   Tricuspid Valve: The tricuspid valve is normal in structure. Tricuspid  valve regurgitation is not demonstrated. No evidence of tricuspid  stenosis.   Aortic Valve: The aortic valve is tricuspid. Aortic valve regurgitation is  not visualized. Mild aortic valve sclerosis is present, with no evidence  of aortic valve stenosis.   Pulmonic Valve: The pulmonic valve was normal in structure. Pulmonic valve  regurgitation is not visualized. No evidence of pulmonic stenosis.   Aorta: The aortic root is normal in size and structure.   Venous: The inferior vena cava is normal in size with  greater than 50%  respiratory variability, suggesting right atrial pressure of 3 mmHg.   IAS/Shunts: No atrial level shunt detected by color flow Doppler.     LEFT VENTRICLE  PLAX 2D  LVIDd:     4.60 cm Diastology  LVIDs:     3.10 cm LV e' medial:  4.79 cm/s  LV PW:     0.90 cm LV E/e' medial: 27.8  LV IVS:    1.20 cm LV e' lateral:  7.29 cm/s  LVOT diam:   2.00 cm LV E/e' lateral: 18.2  LV SV:     57  LV SV Index:  27  LVOT Area:   3.14 cm               3D Volume EF:             3D EF:    52 %             LV EDV:    124 ml             LV ESV:    60 ml             LV SV:    65 ml   RIGHT VENTRICLE  RV Basal diam: 3.10 cm  RV S prime:   7.94 cm/s  TAPSE (M-mode): 1.4 cm   LEFT ATRIUM       Index    RIGHT ATRIUM      Index  LA diam:    3.20 cm 1.48 cm/m RA Area:   12.50 cm  LA Vol (A2C):  72.6 ml 33.65 ml/m RA Volume:  29.60 ml 13.72 ml/m  LA Vol (A4C):  61.6 ml 28.55 ml/m  LA Biplane Vol: 68.7 ml 31.85 ml/m  AORTIC VALVE  LVOT Vmax:  86.80 cm/s  LVOT Vmean: 64.100 cm/s  LVOT VTI:  0.183 m    AORTA  Ao Root diam: 3.30 cm  Ao Asc diam: 3.30 cm   MITRAL VALVE  MV Area (PHT): 2.42 cm   SHUNTS  MV Area VTI:  1.18 cm   Systemic VTI: 0.18 m  MV Peak grad: 9.4 mmHg   Systemic Diam: 2.00 cm  MV  Mean grad: 4.0 mmHg  MV Vmax:    1.54 m/s  MV Vmean:   95.0 cm/s  MV Decel Time: 314 msec  MV E velocity: 133.00 cm/s  MV A velocity: 105.00 cm/s  MV E/A ratio: 1.27   Fransico Him MD  Electronically signed by Fransico Him MD  Signature Date/Time: 04/10/2020/1:21:31 PM     Impression:  Patient is doing very well approximately 1 year status post mitral valve repair  Plan:  We have not recommended any change the patient's current medications.  The patient has been reminded regarding the importance of  dental hygiene and the lifelong need for antibiotic prophylaxis for all dental cleanings and other related invasive procedures.  The patient will continue to follow-up intermittently with Dr. Ellyn Hack and return to our office in the future only should specific problems or questions arise.  I spent in excess of 15 minutes during the conduct of this office consultation and >50% of this time involved direct face-to-face encounter with the patient for counseling and/or coordination of their care.    Valentina Gu. Roxy Manns, MD 04/21/2020 11:41 AM

## 2020-04-25 ENCOUNTER — Telehealth: Payer: Self-pay | Admitting: Cardiology

## 2020-04-25 NOTE — Telephone Encounter (Signed)
Spoke with patient regarding Thursday 05/22/20 at 10:30 am with Dr. Annamaria Boots at [place], [address]----phone [phone].  Will mail information to patient and it is available in My Chart.  Patient voiced her understanding.

## 2020-05-11 ENCOUNTER — Encounter: Payer: Self-pay | Admitting: Cardiology

## 2020-05-11 NOTE — Assessment & Plan Note (Signed)
She had negative Myoview and not normal coronaries on cardiac catheterization last year.  Restart medications with statin and beta-blocker along with aspirin.

## 2020-05-11 NOTE — Assessment & Plan Note (Signed)
Totally resolved.  Remains on low-dose twice daily metoprolol.

## 2020-05-11 NOTE — Assessment & Plan Note (Signed)
She had pretty rapid progression of disease became quite symptomatic.  Had severe mitral valve disease with MVP and regurgitation, now status post MVR.  EF stable, atrial sizes improved.  Pressure stable.  Follow-up echocardiogram shows normal functioning valve.

## 2020-05-11 NOTE — Assessment & Plan Note (Signed)
Overall stable.  Her beta-blocker dose has been reduced significantly and palpitations seem to be under control.  She is taking Toprol 5 mg in the morning and 25 mg in the evening with PRN additional dose.

## 2020-05-11 NOTE — Assessment & Plan Note (Addendum)
1 year out from very complex valvuloplasty for severe mitral prolapse with mitral regurgitation.  Follow-up echocardiogram shows stable results.  No MR or significant MS.  Would probably recheck echo at this time next year, then if stable, will check maybe every 3 years.  Plan: SBE prophylaxis--has standing prescription for amoxicillin 2 g 1 hour preop   Mild residual P HTN findings with mild diastolic function.    She has baseline fatigue and exertional dyspnea, likely related to deconditioning and lack of exercise.

## 2020-05-11 NOTE — Assessment & Plan Note (Signed)
With EF now back to normal and no real residual mitral disease postoperatively combined with normal coronary arteries by cath last year, it is difficult to say that her dyspnea is related to cardiac etiology.  Probably underlying pulmonary disease versus obesity and deconditioning.  Continue to follow-up with pulmonary medicine.

## 2020-05-11 NOTE — Assessment & Plan Note (Signed)
Notably improved symptoms after MVR.  Remains on low-dose diuretic.  Has not required any afterload reduction.  Blood pressure looks pretty well controlled on current dose of metoprolol.  Continue low-dose furosemide with additional dosing as needed for worsening edema or dyspnea.Marland Kitchen

## 2020-05-11 NOTE — Assessment & Plan Note (Signed)
Need to continue follow-up with pulmonary medicine for CPAP.

## 2020-05-15 ENCOUNTER — Institutional Professional Consult (permissible substitution): Payer: Medicare Other | Admitting: Internal Medicine

## 2020-05-18 IMAGING — DX DG CHEST 2V
2 series · 2 of 2 positions shown · non-contrast
Comparison: April 24, 2019

CLINICAL DATA: Status post mitral valve replacement

EXAM:
CHEST - 2 VIEW

[dg chest 2 view (1 of 2)]
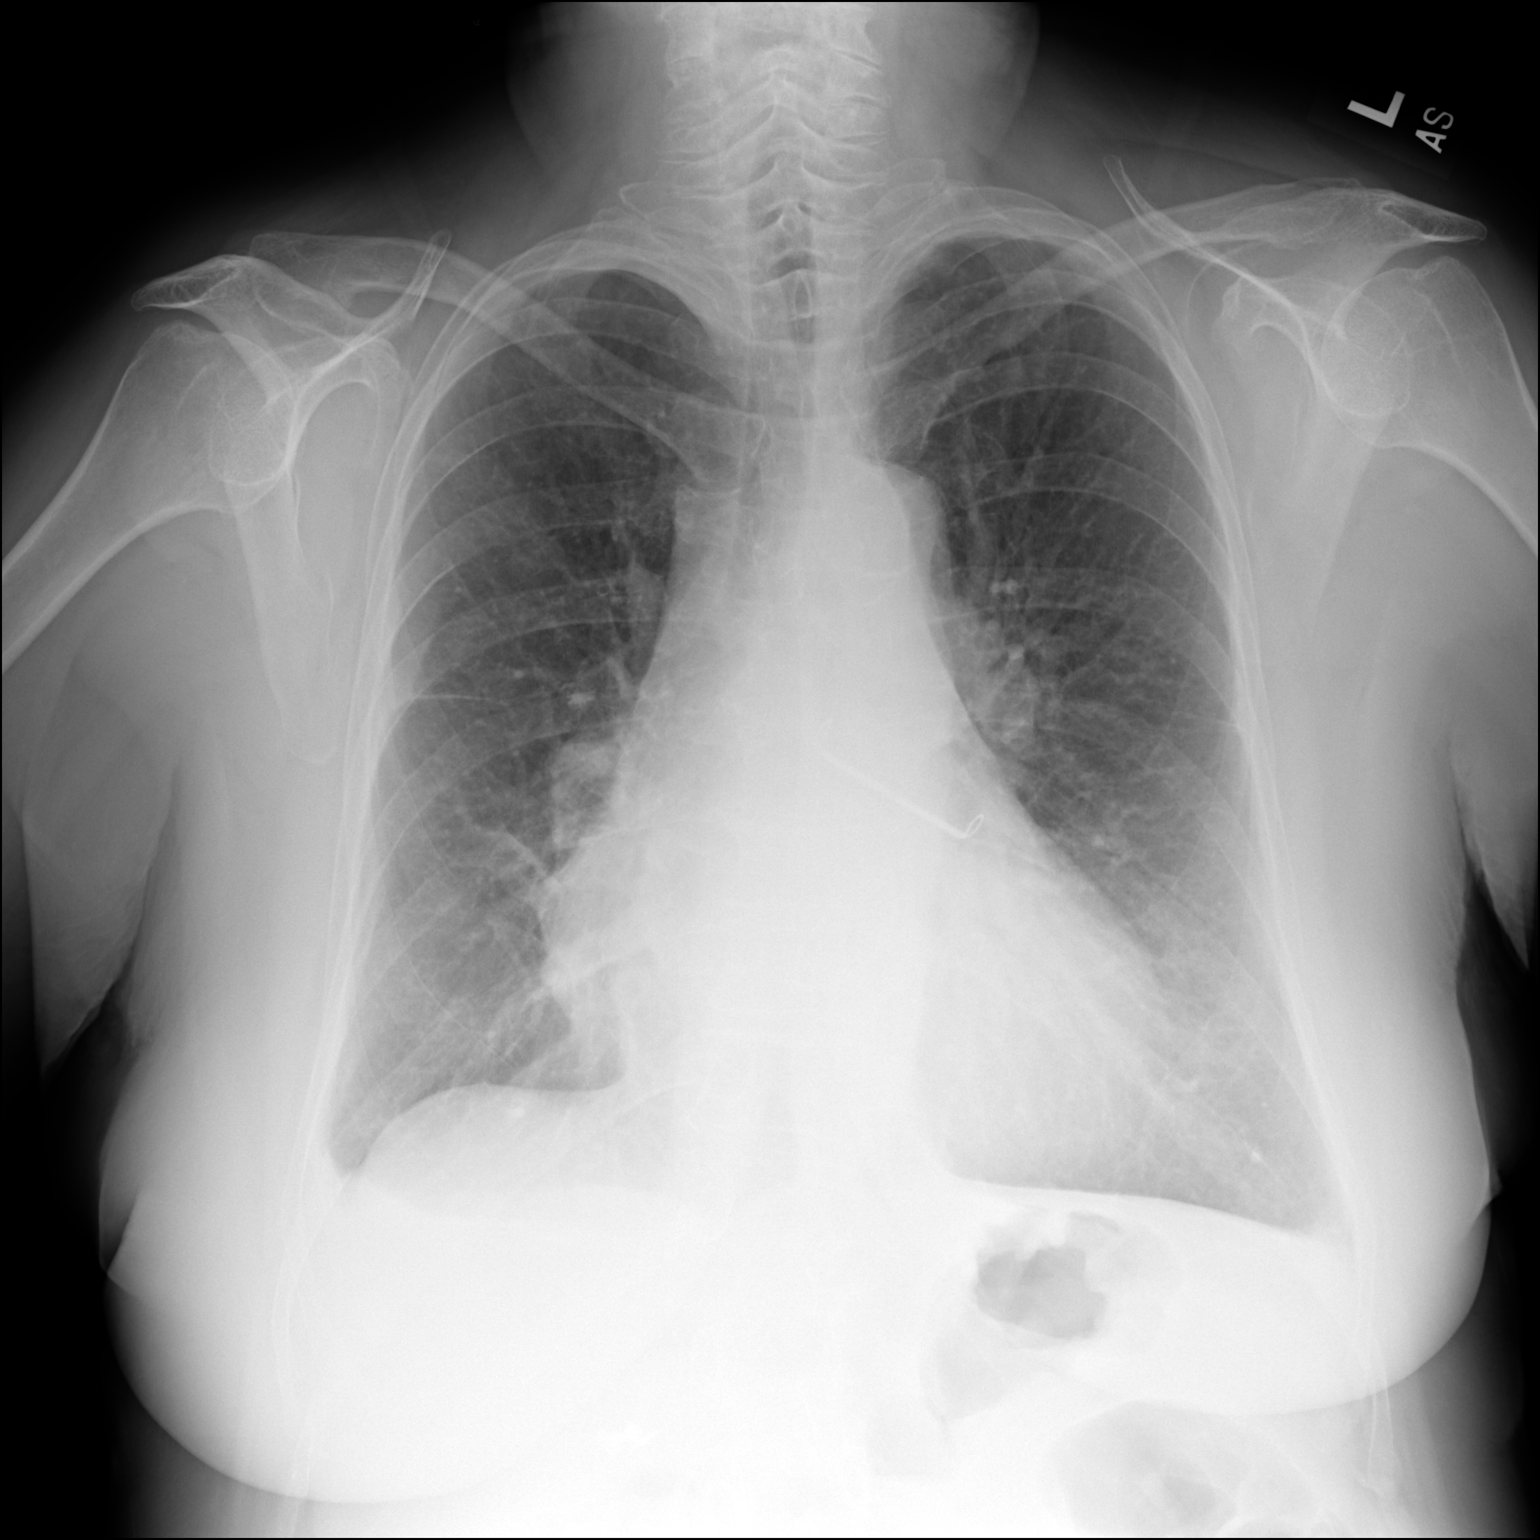

[dg chest 2 view (2 of 2)]
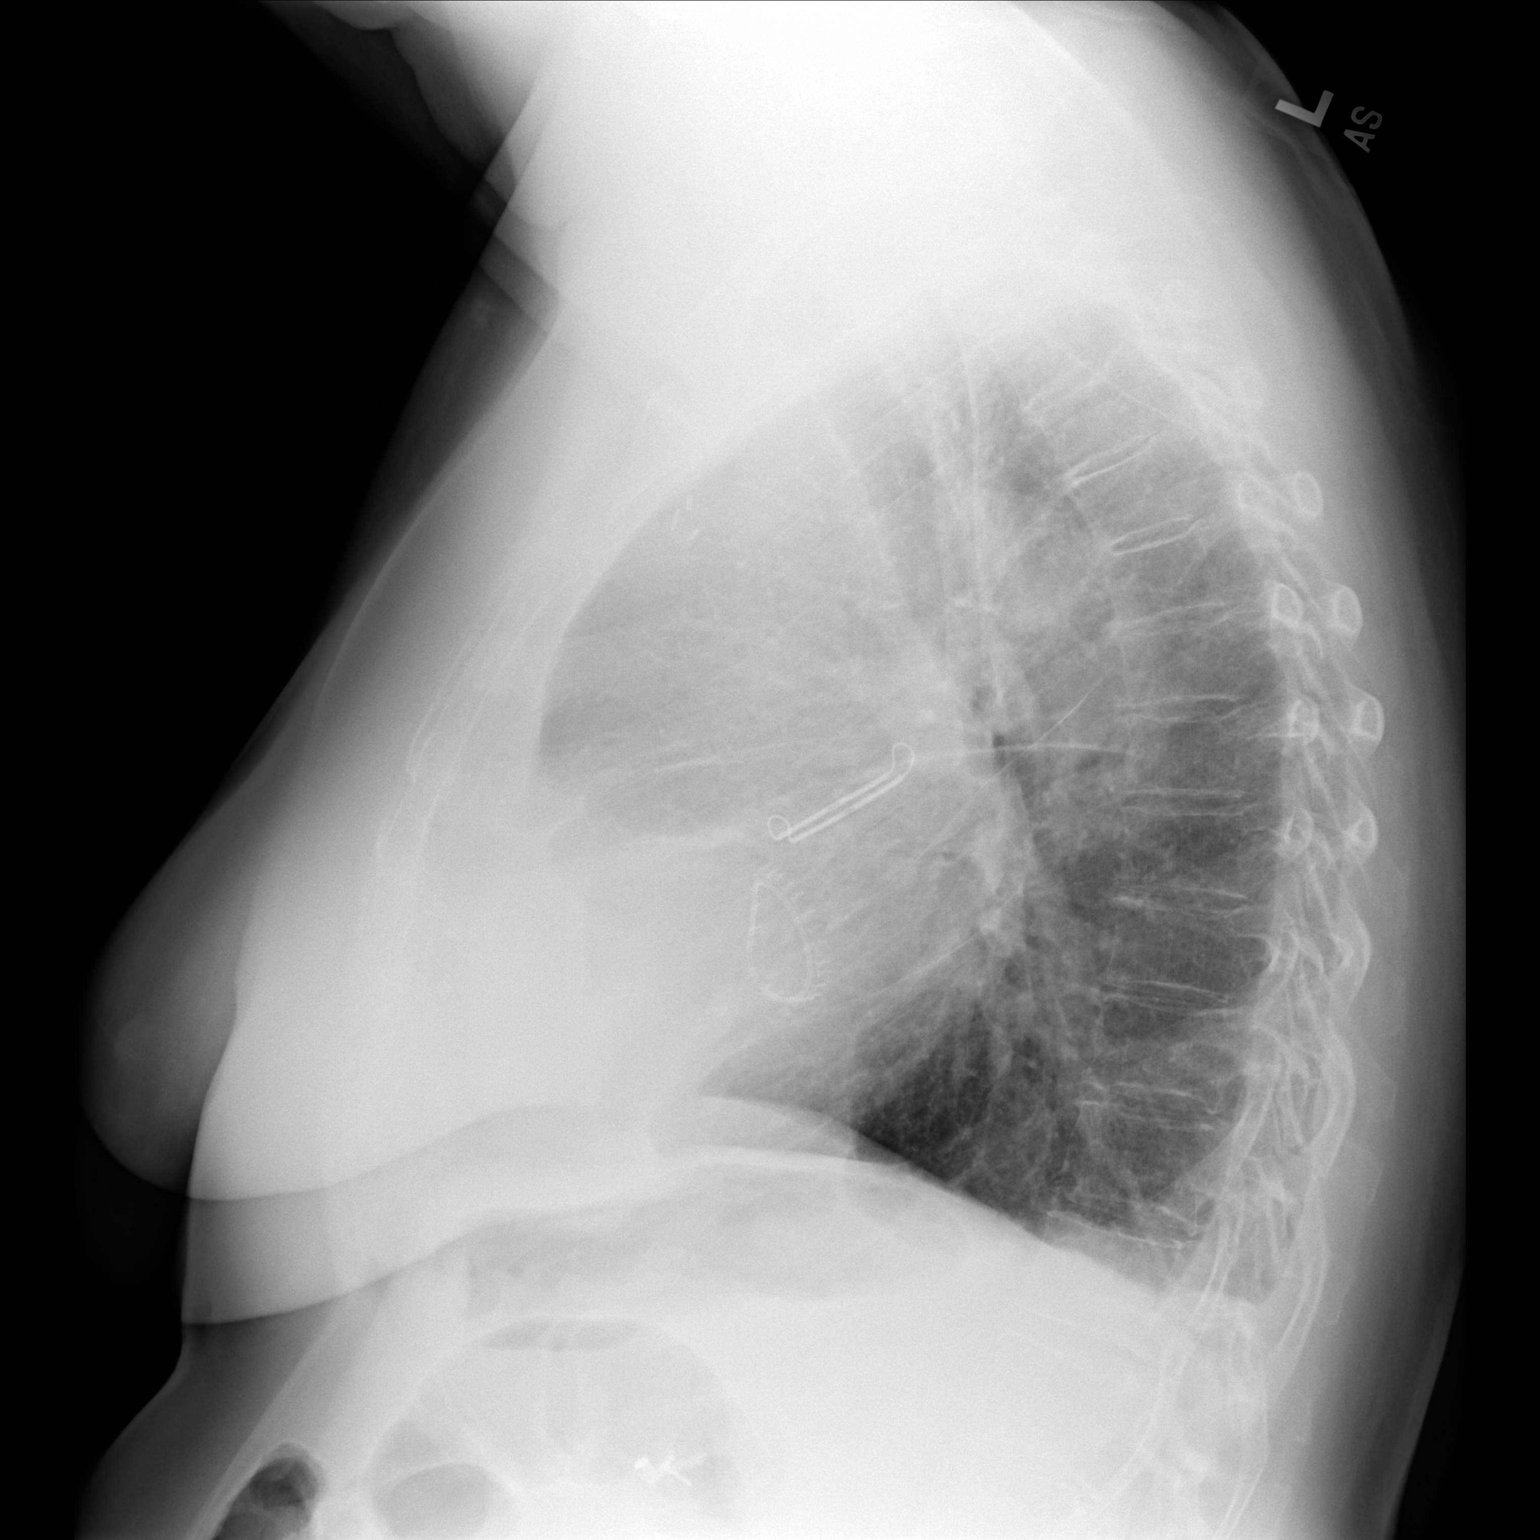

[2 of 2 positions shown; findings below may reference images not displayed]

FINDINGS: There is a small right pleural effusion with mild right middle lobe
atelectatic change. The lungs elsewhere are clear. Heart is upper
normal in size with pulmonary vascularity normal. Patient is status
post mitral valve replacement. There is left atrial appendage clamp.
No adenopathy. No pneumothorax. No bone lesions.
IMPRESSION: Small right pleural effusion with mild right middle lobe
atelectasis. Lungs elsewhere clear. Heart upper normal in size.
Status post mitral valve replacement and left atrial appendage clamp
placement. No adenopathy.

## 2020-05-22 ENCOUNTER — Ambulatory Visit: Payer: Medicare Other | Admitting: Internal Medicine

## 2020-06-04 DIAGNOSIS — E039 Hypothyroidism, unspecified: Secondary | ICD-10-CM | POA: Diagnosis not present

## 2020-06-04 DIAGNOSIS — F439 Reaction to severe stress, unspecified: Secondary | ICD-10-CM | POA: Diagnosis not present

## 2020-06-04 DIAGNOSIS — J309 Allergic rhinitis, unspecified: Secondary | ICD-10-CM | POA: Diagnosis not present

## 2020-06-04 DIAGNOSIS — E782 Mixed hyperlipidemia: Secondary | ICD-10-CM | POA: Diagnosis not present

## 2020-06-04 DIAGNOSIS — K219 Gastro-esophageal reflux disease without esophagitis: Secondary | ICD-10-CM | POA: Diagnosis not present

## 2020-06-04 DIAGNOSIS — I4891 Unspecified atrial fibrillation: Secondary | ICD-10-CM | POA: Diagnosis not present

## 2020-06-04 DIAGNOSIS — Z8659 Personal history of other mental and behavioral disorders: Secondary | ICD-10-CM | POA: Diagnosis not present

## 2020-06-04 DIAGNOSIS — N183 Chronic kidney disease, stage 3 unspecified: Secondary | ICD-10-CM | POA: Diagnosis not present

## 2020-06-06 NOTE — Progress Notes (Signed)
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  Subsequent CPAP to 12. Now referred back by Dr Ellyn Hack with concern of Dyspnea on Exertin.  PFT 04/11/19- Severe obstruction, severe restriction, insignificant response to BD, Normal TLC, Increased Diffusion relative to alveolar volume  ===========================================================  04/03/19- 70 yoF former smoker for sleep evaluation. Self referred on recommendation by a friend. 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 "Took a while" to get used to CPAP, nasal pillows. With onset of Connective Tissue Disease and Sicca, she began having dryness of yes and nose/ nosebleeds, aggravated by CPAP. With onset of Covid epidemic last year, she became afraid to use her CPAP and hasn't used it in a year. Had tried nasal saline gel and oral wetting solution.   ENT surgery limited to tonsils.     Quit smoking in 2016, but still chews nicotine gum. Denies cough, occ mild wheeze. Inhalers no help. No O2. Very limited by DOE and rapid heart beat on simple ADLS at home. Can drive. Hx of pneumonia, chronic bronchitis. CT chest at [facility] showed pleural effusions, CAD. Cardiology follows for moderately severe mitral regurg. Pending further w/u for possible surgical repair. Has had PFT at [facility]- seeking records.   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  Subsequent CPAP to 12/ [facility].. Now referred back by Dr Ellyn Hack with concern of Dyspnea on Exertion.  PFT 04/11/19- Severe obstruction, severe restriction, insignificant response to BD, Normal TLC, Increased Diffusion relative to alveolar volume.  -Proair hfa, Flonase,  Body weight today-246 lbs Covid vax-3 Phizer Flu vax- had ------Reports chest congestion and cough for past 2 days.  Allergic rhinitis in Spring pollen season- eyes gummy and lids sticking. Flonase and AH not helping.  Has not been using CPAP. 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  Her machine is about 70 years old. Discussed alternatives. She is very interested in oral appliance and agrees to update sleep study. Shows sternotomy scar from mitral valve surgery. Breathing much better since that was done.   ROS-see HPI   + = positive Constitutional:    weight loss, night sweats, fevers, chills, fatigue, lassitude. HEENT:    headaches, difficulty swallowing, tooth/dental problems, sore throat,      + sneezing, itching, ear ache, +nasal congestion, post nasal drip, snoring CV:    chest pain, orthopnea, PND, swelling in lower extremities, anasarca,                                  dizziness, +palpitations Resp:   +shortness of breath with exertion or at rest.                productive cough,   non-productive cough, coughing up of  blood.              change in color of mucus.  wheezing.   Skin:    rash or lesions. GI:  +heartburn, indigestion, abdominal pain, nausea, vomiting, diarrhea,                 change in bowel habits, loss of appetite GU: dysuria, change in color of urine, no urgency or frequency.   flank  pain. MS:   joint pain, stiffness, decreased range of motion, back pain. Neuro-     nothing unusual Psych:  change in mood or affect.  depression or +anxiety.   memory loss.  OBJ- Physical Exam General- Alert, Oriented, Affect-appropriate, Distress- none acute, +overweight Skin- rash-none, lesions- none, excoriation- none Lymphadenopathy- none Head- atraumatic            Eyes- Gross vision intact, PERRLA, conjunctivae and secretions clear            Ears- Hearing, canals-normal            Nose- Clear, no-Septal dev, mucus, polyps, erosion, perforation             Throat- Mallampati III , mucosa clear , drainage- none, tonsils- atrophic, + teeth Neck- flexible , trachea midline, no stridor , thyroid nl, carotid no bruit Chest - symmetrical excursion , unlabored           Heart/CV- RRR ,  Murmur-  none , no gallop  , no rub, nl s1 s2                           - JVD- none , edema , stasis changes- none, varices- none           Lung- clear to P&A, wheeze- none, cough- none , dullness+ bases, rub- none           Chest wall- + sternotomy scar Abd-  Br/ Gen/ Rectal- Not done, not indicated Extrem- cyanosis- none, clubbing, none, atrophy- none, strength- nl Neuro- grossly intact to observation

## 2020-06-09 ENCOUNTER — Other Ambulatory Visit: Payer: Self-pay

## 2020-06-09 ENCOUNTER — Ambulatory Visit (INDEPENDENT_AMBULATORY_CARE_PROVIDER_SITE_OTHER): Payer: Medicare Other | Admitting: Internal Medicine

## 2020-06-09 ENCOUNTER — Encounter: Payer: Self-pay | Admitting: Internal Medicine

## 2020-06-09 VITALS — BP 114/70 | HR 73 | Temp 97.9°F | Ht 68.0 in | Wt 246.0 lb

## 2020-06-09 DIAGNOSIS — J3089 Other allergic rhinitis: Secondary | ICD-10-CM | POA: Diagnosis not present

## 2020-06-09 DIAGNOSIS — J302 Other seasonal allergic rhinitis: Secondary | ICD-10-CM

## 2020-06-09 DIAGNOSIS — I2584 Coronary atherosclerosis due to calcified coronary lesion: Secondary | ICD-10-CM

## 2020-06-09 DIAGNOSIS — J449 Chronic obstructive pulmonary disease, unspecified: Secondary | ICD-10-CM

## 2020-06-09 DIAGNOSIS — I2510 Atherosclerotic heart disease of native coronary artery without angina pectoris: Secondary | ICD-10-CM | POA: Diagnosis not present

## 2020-06-09 DIAGNOSIS — G4733 Obstructive sleep apnea (adult) (pediatric): Secondary | ICD-10-CM | POA: Diagnosis not present

## 2020-06-09 MED ORDER — METHYLPREDNISOLONE ACETATE 80 MG/ML IJ SUSP
80.0000 mg | Freq: Once | INTRAMUSCULAR | Status: AC
  Administered 2020-06-09: 80 mg via INTRAMUSCULAR

## 2020-06-09 NOTE — Patient Instructions (Signed)
Order- schedule HST   Dx OSA  Please call us about 2 weeks after your sleep test for results and recommendations. If appropriate, we may be able to go ahead and refer you for an oral appliance, or look at refitting CPAP with your machine.   Order- schedule PFT    Dx COPD mixed type  Order- depo 80     Dx Seasonal and perennial allergic rhinitis  Please call if we can help

## 2020-06-09 NOTE — Assessment & Plan Note (Signed)
She needs to requalilfy. Then if appropriate, she may be more comfortable with a fitted oral appliance. Treatment options dicussed Plan- Home Sleep Test. Call for results and possible referral for OAP.

## 2020-06-28 ENCOUNTER — Other Ambulatory Visit: Payer: Self-pay | Admitting: Physician Assistant

## 2020-07-01 ENCOUNTER — Telehealth: Payer: Self-pay | Admitting: Cardiology

## 2020-07-01 NOTE — Telephone Encounter (Signed)
*  STAT* If patient is at the pharmacy, call can be transferred to refill team.   1. Which medications need to be refilled? (please list name of each medication and dose if known) furosemide (LASIX) 20 MG tablet  2. Which pharmacy/location (including street and city if local pharmacy) is medication to be sent to? [place], [address] AT [address]  3. Do they need a 30 day or 90 day supply? 90 day   Patient has 1 tablet left

## 2020-07-02 DIAGNOSIS — R6884 Jaw pain: Secondary | ICD-10-CM | POA: Diagnosis not present

## 2020-07-02 MED ORDER — FUROSEMIDE 20 MG PO TABS: 20.0000 mg | ORAL_TABLET | Freq: Every day | ORAL | 3 refills | Status: DC | PRN

## 2020-07-02 NOTE — Telephone Encounter (Signed)
Was calling to let patient know that I sent in refills for his Furosemide over to the pharmacy.

## 2020-07-04 DIAGNOSIS — H04123 Dry eye syndrome of bilateral lacrimal glands: Secondary | ICD-10-CM | POA: Diagnosis not present

## 2020-07-04 DIAGNOSIS — Z79899 Other long term (current) drug therapy: Secondary | ICD-10-CM | POA: Diagnosis not present

## 2020-07-09 ENCOUNTER — Ambulatory Visit: Payer: Medicare Other

## 2020-07-09 ENCOUNTER — Other Ambulatory Visit: Payer: Self-pay

## 2020-07-09 DIAGNOSIS — G4733 Obstructive sleep apnea (adult) (pediatric): Secondary | ICD-10-CM

## 2020-07-11 DIAGNOSIS — G4733 Obstructive sleep apnea (adult) (pediatric): Secondary | ICD-10-CM | POA: Diagnosis not present

## 2020-07-23 DIAGNOSIS — M26609 Unspecified temporomandibular joint disorder, unspecified side: Secondary | ICD-10-CM | POA: Diagnosis not present

## 2020-07-23 DIAGNOSIS — H9203 Otalgia, bilateral: Secondary | ICD-10-CM | POA: Diagnosis not present

## 2020-07-24 DIAGNOSIS — N183 Chronic kidney disease, stage 3 unspecified: Secondary | ICD-10-CM | POA: Diagnosis not present

## 2020-07-24 DIAGNOSIS — Z79899 Other long term (current) drug therapy: Secondary | ICD-10-CM | POA: Diagnosis not present

## 2020-07-24 DIAGNOSIS — R760 Raised antibody titer: Secondary | ICD-10-CM | POA: Diagnosis not present

## 2020-07-24 DIAGNOSIS — M359 Systemic involvement of connective tissue, unspecified: Secondary | ICD-10-CM | POA: Diagnosis not present

## 2020-07-31 ENCOUNTER — Other Ambulatory Visit: Payer: Self-pay | Admitting: Physician Assistant

## 2020-08-28 DIAGNOSIS — J069 Acute upper respiratory infection, unspecified: Secondary | ICD-10-CM | POA: Diagnosis not present

## 2020-08-28 DIAGNOSIS — U071 COVID-19: Secondary | ICD-10-CM | POA: Diagnosis not present

## 2020-09-08 ENCOUNTER — Other Ambulatory Visit (HOSPITAL_COMMUNITY): Payer: Medicare Other

## 2020-09-10 ENCOUNTER — Ambulatory Visit: Payer: Medicare Other | Admitting: Internal Medicine

## 2020-09-16 ENCOUNTER — Other Ambulatory Visit: Payer: Self-pay | Admitting: Physician Assistant

## 2020-09-17 ENCOUNTER — Telehealth: Payer: Self-pay | Admitting: Cardiology

## 2020-09-17 MED ORDER — POTASSIUM CHLORIDE ER 10 MEQ PO TBCR: 10.0000 meq | EXTENDED_RELEASE_TABLET | Freq: Every day | ORAL | 1 refills | Status: DC

## 2020-09-17 NOTE — Telephone Encounter (Signed)
Refills has been sent to the pharmacy. 

## 2020-09-17 NOTE — Telephone Encounter (Signed)
*  STAT* If patient is at the pharmacy, call can be transferred to refill team.   1. Which medications need to be refilled? (please list name of each medication and dose if known) potassium chloride (KLOR-CON) 10 MEQ tablet  2. Which pharmacy/location (including street and city if local pharmacy) is medication to be sent to? [place], [address] AT [address]  3. Do they need a 30 day or 90 day supply? [place]

## 2020-09-22 ENCOUNTER — Ambulatory Visit (INDEPENDENT_AMBULATORY_CARE_PROVIDER_SITE_OTHER): Payer: Medicare Other | Admitting: Internal Medicine

## 2020-09-22 ENCOUNTER — Encounter: Payer: Self-pay | Admitting: Acute Care

## 2020-09-22 ENCOUNTER — Ambulatory Visit (INDEPENDENT_AMBULATORY_CARE_PROVIDER_SITE_OTHER): Payer: Medicare Other | Admitting: Acute Care

## 2020-09-22 ENCOUNTER — Other Ambulatory Visit: Payer: Self-pay

## 2020-09-22 VITALS — BP 124/70 | HR 70 | Temp 97.3°F | Ht 68.0 in | Wt 251.4 lb

## 2020-09-22 DIAGNOSIS — J449 Chronic obstructive pulmonary disease, unspecified: Secondary | ICD-10-CM | POA: Diagnosis not present

## 2020-09-22 DIAGNOSIS — I2584 Coronary atherosclerosis due to calcified coronary lesion: Secondary | ICD-10-CM | POA: Diagnosis not present

## 2020-09-22 DIAGNOSIS — R0600 Dyspnea, unspecified: Secondary | ICD-10-CM

## 2020-09-22 DIAGNOSIS — G4733 Obstructive sleep apnea (adult) (pediatric): Secondary | ICD-10-CM

## 2020-09-22 DIAGNOSIS — U071 COVID-19: Secondary | ICD-10-CM | POA: Diagnosis not present

## 2020-09-22 DIAGNOSIS — I2510 Atherosclerotic heart disease of native coronary artery without angina pectoris: Secondary | ICD-10-CM

## 2020-09-22 DIAGNOSIS — R0609 Other forms of dyspnea: Secondary | ICD-10-CM

## 2020-09-22 DIAGNOSIS — G4734 Idiopathic sleep related nonobstructive alveolar hypoventilation: Secondary | ICD-10-CM | POA: Diagnosis not present

## 2020-09-22 DIAGNOSIS — Z87891 Personal history of nicotine dependence: Secondary | ICD-10-CM

## 2020-09-22 LAB — PULMONARY FUNCTION TEST
DL/VA % pred: 98 %
DL/VA: 3.94 ml/min/mmHg/L
DLCO cor % pred: 75 %
DLCO cor: 16.79 ml/min/mmHg
DLCO unc % pred: 75 %
DLCO unc: 16.79 ml/min/mmHg
FEF 25-75 Post: 1.58 L/sec
FEF 25-75 Pre: 0.85 L/sec
FEF2575-%Change-Post: 85 %
FEF2575-%Pred-Post: 73 %
FEF2575-%Pred-Pre: 39 %
FEV1-%Change-Post: 20 %
FEV1-%Pred-Post: 62 %
FEV1-%Pred-Pre: 51 %
FEV1-Post: 1.65 L
FEV1-Pre: 1.37 L
FEV1FVC-%Change-Post: 0 %
FEV1FVC-%Pred-Pre: 92 %
FEV6-%Change-Post: 19 %
FEV6-%Pred-Post: 69 %
FEV6-%Pred-Pre: 58 %
FEV6-Post: 2.33 L
FEV6-Pre: 1.95 L
FEV6FVC-%Change-Post: 0 %
FEV6FVC-%Pred-Post: 104 %
FEV6FVC-%Pred-Pre: 104 %
FVC-%Change-Post: 19 %
FVC-%Pred-Post: 66 %
FVC-%Pred-Pre: 55 %
FVC-Post: 2.33 L
FVC-Pre: 1.95 L
Post FEV1/FVC ratio: 71 %
Post FEV6/FVC ratio: 100 %
Pre FEV1/FVC ratio: 70 %
Pre FEV6/FVC Ratio: 100 %
RV % pred: 118 %
RV: 2.84 L
TLC % pred: 87 %
TLC: 4.93 L

## 2020-09-22 NOTE — Progress Notes (Signed)
PFT done today. 

## 2020-09-22 NOTE — Progress Notes (Signed)
History of Present Illness Rebecca Carr is a 70 y.o. female former smoker with COPD, and recent diagnosis of Covid 19 5 weeks ago. She is followed by Dr. Annamaria Boots.    09/22/2020 Pt. Presents for follow up. She has been scheduled to see Dr. Annamaria Boots, but she was unable to come to the appointment as she had been diagnosed with Covid. She was treated with Paxlovid  and an antibiotic. She states she recovered well. No serious s/s with the exception of bad headache. She does not use maintenance inhalers. She does not often use her rescue inhaler. She does endorse some dyspnea on exertion, but she feels this is due to being overweight and deconditioned. She is a former smoker who quit in 2016, with a 48 pack year smoking history. She does still use nicotine gum for cravings. Sats  today on RA were 95%.  PFT's show moderate to severe obstruction, with good BD response. DLCO of 75%.  She had a home sleep study done 07/2020, but had never received her results. I reviewed them with her today, confirming mild OSA, with desaturations. She has a CPAP machine that she has not been using. She is in agreement to resuming treatment. She does complain of not sleeping through the night and morning headaches at times.  Pt. Is interested in using an inhaler, but wants to call her insurance to see which inhaler is preferred by her insurance. Once she discovers which one is preferred we will send in a prescription.  Pt. Is also interested in lung cancer screening.    Test Results: PFT's 09/2020          07/2020 Home Sleep Study           CBC Latest Ref Rng & Units 05/10/2019 04/24/2019 04/21/2019  WBC 3.4 - 10.8 x10E3/uL 10.0 16.2(H) 15.5(H)  Hemoglobin 11.1 - 15.9 g/dL 9.4(L) 8.3(L) 9.1(L)  Hematocrit 34.0 - 46.6 % 30.1(L) 26.7(L) 29.1(L)  Platelets 150 - 450 x10E3/uL 314 199 199    BMP Latest Ref Rng & Units 05/10/2019 04/26/2019 04/25/2019  Glucose 65 - 99 mg/dL 99 105(H) 86  BUN 8 - 27 mg/dL 22 27(H)  37(H)  Creatinine 0.57 - 1.00 mg/dL 1.15(H) 1.28(H) 1.34(H)  BUN/Creat Ratio 12 - 28 19 - -  Sodium 134 - 144 mmol/L 143 140 138  Potassium 3.5 - 5.2 mmol/L 4.8 4.3 3.5  Chloride 96 - 106 mmol/L 103 97(L) 90(L)  CO2 20 - 29 mmol/L 24 33(H) 35(H)  Calcium 8.7 - 10.3 mg/dL 9.0 8.9 8.5(L)    BNP    Component Value Date/Time   BNP 128.8 (H) 04/24/2019 0730    ProBNP No results found for: PROBNP  PFT    Component Value Date/Time   FEV1PRE 1.37 09/22/2020 1110   FEV1POST 1.65 09/22/2020 1110   FVCPRE 1.95 09/22/2020 1110   FVCPOST 2.33 09/22/2020 1110   TLC 4.93 09/22/2020 1110   DLCOUNC 16.79 09/22/2020 1110   PREFEV1FVCRT 70 09/22/2020 1110   PSTFEV1FVCRT 71 09/22/2020 1110    No results found.   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   Hashimoto's thyroiditis    Hiatal hernia    History of adenomatous polyp of colon    hyperplastic 2015  and tubular adenoma 2005   History of esophageal dilatation    of stricture   History of esophagitis    LA class , grade B and Gastritis   History of pituitary tumor    dx 1990's - asymptomatic /  per pt last scan 2005 approx.  resolved    Hyperlipidemia    Hypothyroidism    Leg pain, bilateral    Mild intermittent asthma    MTHFR mutation    OSA (obstructive sleep apnea)    non-compliant cpap   Prediabetes    S/P mitral valve repair 04/13/2019   Complex valvuloplasty including triangular resection of flail posterior leaflet + artificial Gore-tex neochords x6  and 34 mm Memo 4D ring annuloplasty   SUI (stress urinary incontinence, female)    VIN III (vulvar intraepithelial neoplasia III)    Wears glasses      Social History   Tobacco Use   Smoking status: Former    Packs/day: 1.00    Years: 48.00    Pack years: 48.00    Types: Cigarettes    Quit date: 05/10/2014    Years since quitting: 6.3   Smokeless tobacco: Never   Tobacco comments:    using nicorrette  Vaping Use   Vaping Use: Never used  Substance Use Topics   Alcohol use: Yes    Alcohol/week: 2.0 standard drinks    Types: 2 Standard drinks or equivalent per week    Comment: Social - > is now essentially quit   Drug use: No    [name] reports that she quit smoking about 6 years ago. Her smoking use included cigarettes. She has a 48.00 pack-year smoking history. She has never used smokeless tobacco. She reports current alcohol use of about 2.0 standard drinks per week. She reports that she does not use drugs.  Tobacco Cessation: Counseling given: Yes Tobacco comments: using nicorrette   Past surgical hx, Family hx, Social hx all reviewed.  Current Outpatient Medications on File Prior to Visit  Medication Sig   Albuterol Sulfate (PROAIR RESPICLICK) [id number] (90 Base) MCG/ACT AEPB Inhale into the lungs.   aspirin EC 81 MG tablet Take 81 mg by mouth at bedtime. Reported on 07/16/2015   atorvastatin (LIPITOR) 40 MG tablet Take 40 mg by mouth at bedtime.    azelastine (ASTELIN) 0.1 % nasal spray Place 1 spray into both nostrils 2 (two) times daily.   Cyanocobalamin (VITAMIN B-12) 5000 MCG LOZG Take 5,000 mcg by mouth daily.   FLUoxetine (PROZAC) 20 MG capsule Take 20 mg by mouth daily at 6 PM. (1700)   fluticasone (FLONASE) 50 MCG/ACT nasal spray Place 2 sprays into both nostrils at bedtime.    furosemide (LASIX) 20 MG tablet Take 1 tablet (20 mg total) by mouth daily as needed.   hydroxychloroquine (PLAQUENIL) 200 MG tablet 100 mg 2 (two) times daily.   levothyroxine (SYNTHROID)  125 MCG tablet Take 125 mcg by mouth daily before breakfast.   liothyronine (CYTOMEL) 5 MCG tablet Take 5 mcg by mouth daily.   metoprolol tartrate (LOPRESSOR) 25 MG tablet TAKE 1/2 TABLET BY MOUTH EVERY MORNING AND 1 TABLET BY MOUTH EVERY EVENING. MAY TAKE EXTRA DOSE IF NEEDED   Multiple Vitamin (  MULTIVITAMIN WITH MINERALS) TABS tablet Take 1 tablet by mouth daily. Multivitamin Essentials   nicotine polacrilex (NICORETTE) 2 MG gum Take 2 mg by mouth as needed for smoking cessation.   OLANZapine (ZYPREXA) 2.5 MG tablet 1 tablet on the tongue and allow to dissolve   omeprazole (PRILOSEC) 40 MG capsule Take 1 capsule (40 mg total) by mouth in the morning and at bedtime.   potassium chloride (KLOR-CON) 10 MEQ tablet Take 1 tablet (10 mEq total) by mouth daily.   No current facility-administered medications on file prior to visit.     Allergies  Allergen Reactions   Iodine Anaphylaxis   Iohexol Anaphylaxis     Desc: Patient states she is allergic to iodine and had a "code blue" incident after an injection for some type of imaging study.    Penicillins Rash    PCN IN LARGE DOSES. Did it involve swelling of the face/tongue/throat, SOB, or low BP? No Did it involve sudden or severe rash/hives, skin peeling, or any reaction on the inside of your mouth or nose? No Did you need to seek medical attention at a hospital or doctor's office? No When did it last happen? More than 10 years ago    If all above answers are "NO", may proceed with cephalosporin use.     Review Of Systems:  Constitutional:   No  weight loss, night sweats,  Fevers, chills, fatigue, or  lassitude.  HEENT:   No headaches,  Difficulty swallowing,  Tooth/dental problems, or  Sore throat,                No sneezing, itching, ear ache, nasal congestion, post nasal drip,   CV:  No chest pain,  Orthopnea, PND, swelling in lower extremities, anasarca, dizziness, palpitations, syncope.   GI  No heartburn, indigestion, abdominal  pain, nausea, vomiting, diarrhea, change in bowel habits, loss of appetite, bloody stools.   Resp: + shortness of breath with exertion none  at rest.  No excess mucus, no productive cough,  No non-productive cough,  No coughing up of blood.  No change in color of mucus.  No wheezing.  No chest wall deformity  Skin: no rash or lesions.  GU: no dysuria, change in color of urine, no urgency or frequency.  No flank pain, no hematuria   MS:  No joint pain or swelling.  No decreased range of motion.  No back pain.  Psych:  No change in mood or affect. No depression or anxiety.  No memory loss.   Vital Signs BP 124/70 (BP Location: Left Arm, Cuff Size: Normal)   Pulse 70   Temp (!) 97.3 F (36.3 C) (Oral)   Ht '5\' 8"'$  (1.727 m)   Wt 251 lb 6.4 oz (114 kg)   SpO2 95%   BMI 38.23 kg/m    Physical Exam:  General- No distress,  A&Ox3, pleasant ENT: No sinus tenderness, TM clear, pale nasal mucosa, no oral exudate,no post nasal drip, no LAN Cardiac: S1, S2, regular rate and rhythm, no murmur Chest: No wheeze/ rales/ dullness; no accessory muscle use, no nasal flaring, no sternal retractions Abd.: Soft Non-tender, ND, BS +, Body mass index is 38.23 kg/m. Ext: No clubbing cyanosis, edema Neuro:  normal strength, MAE x 4, A&O x 3 Skin: No rashes, NO lesions, warm and dry Psych: normal mood and behavior   Assessment/Plan COPD Recent COVID 19  OSA with hypoxemia>> Does not sleep through the night Former smoker Plan  am glad  you have recovered from your Covid.  Your PFT's do still show obstruction They do show good response to broncho dilators. Continue using your Albuterol inhaler as needed for shortness of breath or wheezing.  We will do Alpha 1 testing. ( Butterfly needle in hand if possible)  Continue using nicorette replacement as needed.  Let us know if you would like to use a maintenance inhaler for COPD.  Call your insurance and see what the preferred maintenance inhalers  for  COPD are . Follow up with Dr. Annamaria Boots in 3 months  Your home sleep test shows you have mild OSA, with oxygen desaturations. Start wearing your CPAP machine . Goal is 4 hours a night We will check with [employer] to see if they can set your machine to Auto set at 5-15 cm pressure.  Please place orders for all new equipment  and supplies Please call patient and help set up machine ( She has machine but has not used it for several years) Follow up in 3 months with down load with Dr. Annamaria Boots. We will make sure you are enrolled in [employer]. We will refer you to Lung Cancer Screening. You will get a call to get this scheduled.  Please contact office for sooner follow up if symptoms do not improve or worsen or seek emergency care     I spent 40 minutes dedicated to the care of this patient on the date of this encounter to include pre-visit review of records, face-to-face time with the patient discussing conditions above, post visit ordering of testing, clinical documentation with the electronic health record, making appropriate referrals as documented, and communicating necessary information to the patient's healthcare team.    Magdalen Spatz, NP 09/22/2020  12:40 PM

## 2020-09-22 NOTE — Patient Instructions (Addendum)
It is good to see you today. I am glad you have recovered from your Covid.  Your PFT's do still show obstruction They do show good response to broncho dilators. Continue using your Albuterol inhaler as needed for shortness of breath or wheezing.  We will do Alpha 1 testing. ( Butterfly needle in hand if possible)  Continue using nicorette replacement as needed.  Let us know if you would like to use a maintenance inhaler for COPD.  Call your insurance and see what the preferred maintenance inhalers  for COPD are . Follow up with Dr. Annamaria Boots in 3 months  Your home sleep test shows you have mild OSA, with oxygen desaturations. Start wearing your CPAP machine . Goal is 4 hours a night We will check with [employer] to see if they can set your machine to Auto set at 5-15 cm pressure.  Please place orders for all new equipment  and supplies Please call patient and help set up machine ( She has machine but has not used it for several years) Follow up in 3 months with down load with Dr. Annamaria Boots. We will make sure you are enrolled in [employer]. We will refer you to Lung Cancer Screening. You will get a call to get this scheduled.  Please contact office for sooner follow up if symptoms do not improve or worsen or seek emergency care

## 2020-10-01 LAB — ALPHA-1 ANTITRYPSIN PHENOTYPE: A-1 Antitrypsin, Ser: 166 mg/dL (ref 83–199)

## 2020-10-16 ENCOUNTER — Telehealth: Payer: Self-pay | Admitting: Cardiology

## 2020-10-16 NOTE — Telephone Encounter (Signed)
This is a [employer] requirement for her to have detailed dental procedures b/c she has a prosthetic heart valve.   We need to fill the Rx.  Rebecca Carr  .Amoxicillin 500 mg - take 4 tabs (2g - '2000mg'$ ) 30-60 min prior to dental procedure - this should be a standing PRN Rx for her.  Only needs 4 tabs - no refills.  [place]

## 2020-10-16 NOTE — Telephone Encounter (Signed)
*  STAT* If patient is at the pharmacy, call can be transferred to refill team.   1. Which medications need to be refilled? (please list name of each medication and dose if known)  amoxicillin (AMOXIL) 500 MG capsule  2. Which pharmacy/location (including street and city if local pharmacy) is medication to be sent to? [place], [address] AT [place]  3. Do they need a 30 day or 90 day supply?   Patient is requesting 2-4 capsules for dental work.

## 2020-10-17 MED ORDER — AMOXICILLIN 500 MG PO CAPS: ORAL_CAPSULE | ORAL | 2 refills | Status: DC

## 2020-10-22 DIAGNOSIS — D485 Neoplasm of uncertain behavior of skin: Secondary | ICD-10-CM | POA: Diagnosis not present

## 2020-10-22 DIAGNOSIS — K115 Sialolithiasis: Secondary | ICD-10-CM | POA: Diagnosis not present

## 2020-10-28 ENCOUNTER — Other Ambulatory Visit: Payer: Self-pay | Admitting: *Deleted

## 2020-10-28 DIAGNOSIS — Z87891 Personal history of nicotine dependence: Secondary | ICD-10-CM

## 2020-11-03 ENCOUNTER — Encounter: Payer: Self-pay | Admitting: Acute Care

## 2020-11-03 ENCOUNTER — Telehealth (INDEPENDENT_AMBULATORY_CARE_PROVIDER_SITE_OTHER): Payer: Medicare Other | Admitting: Acute Care

## 2020-11-03 DIAGNOSIS — Z87891 Personal history of nicotine dependence: Secondary | ICD-10-CM | POA: Diagnosis not present

## 2020-11-03 NOTE — Patient Instructions (Signed)
Thank you for participating in the [facility]. It was our pleasure to meet you today. We will call you with the results of your scan within the next few days. Your scan will be assigned a Lung RADS category score by the physicians reading the scans.  This Lung RADS score determines follow up scanning.  See below for description of categories, and follow up screening recommendations. We will be in touch to schedule your follow up screening annually or based on recommendations of our providers. We will fax a copy of your scan results to your Primary Care Physician, or the physician who referred you to the program, to ensure they have the results. Please call the office if you have any questions or concerns regarding your scanning experience or results.  Our office number is [phone]. Please speak with Denise Phelps, RN. She is our Lung Cancer Screening RN. If she is unavailable when you call, please have the office staff send her a message. She will return your call at her earliest convenience. Remember, if your scan is normal, we will scan you annually as long as you continue to meet the criteria for the program. (Age 55-77, Current smoker or smoker who has quit within the last 15 years). If you are a smoker, remember, quitting is the single most powerful action that you can take to decrease your risk of lung cancer and other pulmonary, breathing related problems. We know quitting is hard, and we are here to help.  Please let us know if there is anything we can do to help you meet your goal of quitting. If you are a former smoker, congratulations. We are proud of you! Remain smoke free! Remember you can refer friends or family members through the number above.  We will screen them to make sure they meet criteria for the program. Thank you for helping us take better care of you by participating in Lung Screening.  Lung RADS Categories:  Lung RADS 1: no nodules  or definitely non-concerning nodules.  Recommendation is for a repeat annual scan in 12 months.  Lung RADS 2:  nodules that are non-concerning in appearance and behavior with a very low likelihood of becoming an active cancer. Recommendation is for a repeat annual scan in 12 months.  Lung RADS 3: nodules that are probably non-concerning , includes nodules with a low likelihood of becoming an active cancer.  Recommendation is for a 6-month repeat screening scan. Often noted after an upper respiratory illness. We will be in touch to make sure you have no questions, and to schedule your 6-month scan.  Lung RADS 4 A: nodules with concerning findings, recommendation is most often for a follow up scan in 3 months or additional testing based on our provider's assessment of the scan. We will be in touch to make sure you have no questions and to schedule the recommended 3 month follow up scan.  Lung RADS 4 B:  indicates findings that are concerning. We will be in touch with you to schedule additional diagnostic testing based on our provider's  assessment of the scan.   

## 2020-11-03 NOTE — Progress Notes (Signed)
Virtual Visit via Video Note  I connected with Rebecca Carr on 11/03/20 at 10:00 AM EDT by a video enabled telemedicine application and verified that I am speaking with the correct person using two identifiers.  Location: Patient: At Home Provider: [facility], [place], [place], [address]    I discussed the limitations of evaluation and management by telemedicine and the availability of in person appointments. The patient expressed understanding and agreed to proceed.   Shared Decision Making Visit Lung Cancer Screening Program [phone])   Eligibility: Age 70 y.o. Pack Years Smoking History Calculation 47 pack years (# packs/per year x # years smoked) Recent History of coughing up blood  no Unexplained weight loss? no ( >Than 15 pounds within the last 6 months ) Prior History Lung / other cancer no (Diagnosis within the last 5 years already requiring surveillance chest CT Scans). Smoking Status Former Smoker Former Smokers: Years since quit: 5 years  Quit Date: 2017  Visit Components: Discussion included one or more decision making aids. yes Discussion included risk/benefits of screening. yes Discussion included potential follow up diagnostic testing for abnormal scans. yes Discussion included meaning and risk of over diagnosis. yes Discussion included meaning and risk of False Positives. yes Discussion included meaning of total radiation exposure. yes  Counseling Included: Importance of adherence to annual lung cancer LDCT screening. yes Impact of comorbidities on ability to participate in the program. yes Ability and willingness to under diagnostic treatment. yes  Smoking Cessation Counseling: Current Smokers:  Discussed importance of smoking cessation. yes Information about tobacco cessation classes and interventions provided to patient. yes Patient provided with "ticket" for LDCT Scan. NA Symptomatic Patient. no  Counseling\ NA Diagnosis Code: Tobacco Use  Z72.0 Asymptomatic Patient yes  Counseling (Intermediate counseling: > three minutes counseling) L2440 Former Smokers:  Discussed the importance of maintaining cigarette abstinence. yes Diagnosis Code: Personal History of Nicotine Dependence. N02.725 Information about tobacco cessation classes and interventions provided to patient. Yes Patient provided with "ticket" for LDCT Scan. NA Written Order for Lung Cancer Screening with LDCT placed in Epic. Yes (CT Chest Lung Cancer Screening Low Dose W/O CM) [id number] Z12.2-Screening of respiratory organs Z87.891-Personal history of nicotine dependence  I spent 25 minutes of face to face time with Rebecca Carr discussing the risks and benefits of lung cancer screening. We viewed a power point together that explained in detail the above noted topics. We took the time to pause the power point at intervals to allow for questions to be asked and answered to ensure understanding. We discussed that Rebecca.  had taken the single most powerful action possible to decrease her risk of developing lung cancer when she quit smoking. I counseled her to remain smoke free, and to contact me if she ever had the desire to smoke again so that I can provide resources and tools to help support the effort to remain smoke free. We discussed the time and location of the scan, and that either  Doroteo Glassman RN or I will call with the results within  24-48 hours of receiving them. She has my card and contact information in the event she needs to speak with me, in addition to a copy of the power point we reviewed as a resource. She verbalized understanding of all of the above and had no further questions upon leaving the office.     I explained to the patient that there has been a high incidence of coronary artery disease noted on these  exams. I explained that this is a non-gated exam therefore degree or severity cannot be determined. This patient is on statin therapy. I have asked the  patient to follow-up with their PCP regarding any incidental finding of coronary artery disease and management with diet or medication as they feel is clinically indicated. The patient verbalized understanding of the above and had no further questions.  She is followed by cardiology     Magdalen Spatz, NP 11/03/20

## 2020-11-06 ENCOUNTER — Ambulatory Visit: Payer: Medicare Other

## 2020-11-09 ENCOUNTER — Encounter: Payer: Self-pay | Admitting: Gastroenterology

## 2020-11-19 ENCOUNTER — Other Ambulatory Visit: Payer: Self-pay

## 2020-11-19 ENCOUNTER — Ambulatory Visit (INDEPENDENT_AMBULATORY_CARE_PROVIDER_SITE_OTHER): Payer: Medicare Other

## 2020-11-19 DIAGNOSIS — Z87891 Personal history of nicotine dependence: Secondary | ICD-10-CM

## 2020-11-19 DIAGNOSIS — Z122 Encounter for screening for malignant neoplasm of respiratory organs: Secondary | ICD-10-CM

## 2020-11-27 ENCOUNTER — Other Ambulatory Visit: Payer: Self-pay | Admitting: *Deleted

## 2020-11-27 DIAGNOSIS — Z87891 Personal history of nicotine dependence: Secondary | ICD-10-CM

## 2020-11-27 NOTE — Progress Notes (Signed)
I have called the patient with the results of her low dose CT. I explained that her scan was read as a Lung  RADS 3, nodules that are probably benign findings, short term follow up suggested: includes nodules with a low likelihood of becoming a clinically active cancer. Radiology recommends a 6 month repeat LDCT follow up.  They recommend 6 month follow up low dose CT. She is in agreement with this.  I did let her know that there was notation of CAD on her scan. She had open heart surgery 1 year ago, and is followed by cardiology. Langley Gauss, 6 month follow up, please fax results to PCP. Thanks so much.

## 2020-12-03 DIAGNOSIS — J449 Chronic obstructive pulmonary disease, unspecified: Secondary | ICD-10-CM | POA: Diagnosis not present

## 2020-12-03 DIAGNOSIS — N1831 Chronic kidney disease, stage 3a: Secondary | ICD-10-CM | POA: Diagnosis not present

## 2020-12-03 DIAGNOSIS — M329 Systemic lupus erythematosus, unspecified: Secondary | ICD-10-CM | POA: Diagnosis not present

## 2020-12-03 DIAGNOSIS — E039 Hypothyroidism, unspecified: Secondary | ICD-10-CM | POA: Diagnosis not present

## 2020-12-03 DIAGNOSIS — E7800 Pure hypercholesterolemia, unspecified: Secondary | ICD-10-CM | POA: Diagnosis not present

## 2020-12-04 ENCOUNTER — Telehealth: Payer: Self-pay | Admitting: Cardiology

## 2020-12-04 DIAGNOSIS — Z1239 Encounter for other screening for malignant neoplasm of breast: Secondary | ICD-10-CM | POA: Diagnosis not present

## 2020-12-04 DIAGNOSIS — I341 Nonrheumatic mitral (valve) prolapse: Secondary | ICD-10-CM | POA: Diagnosis not present

## 2020-12-04 DIAGNOSIS — E782 Mixed hyperlipidemia: Secondary | ICD-10-CM | POA: Diagnosis not present

## 2020-12-04 DIAGNOSIS — Z23 Encounter for immunization: Secondary | ICD-10-CM | POA: Diagnosis not present

## 2020-12-04 DIAGNOSIS — K219 Gastro-esophageal reflux disease without esophagitis: Secondary | ICD-10-CM | POA: Diagnosis not present

## 2020-12-04 DIAGNOSIS — Z0000 Encounter for general adult medical examination without abnormal findings: Secondary | ICD-10-CM | POA: Diagnosis not present

## 2020-12-04 DIAGNOSIS — Z1211 Encounter for screening for malignant neoplasm of colon: Secondary | ICD-10-CM | POA: Diagnosis not present

## 2020-12-04 DIAGNOSIS — I4891 Unspecified atrial fibrillation: Secondary | ICD-10-CM | POA: Diagnosis not present

## 2020-12-04 DIAGNOSIS — F439 Reaction to severe stress, unspecified: Secondary | ICD-10-CM | POA: Diagnosis not present

## 2020-12-04 DIAGNOSIS — N183 Chronic kidney disease, stage 3 unspecified: Secondary | ICD-10-CM | POA: Diagnosis not present

## 2020-12-04 DIAGNOSIS — M359 Systemic involvement of connective tissue, unspecified: Secondary | ICD-10-CM | POA: Diagnosis not present

## 2020-12-04 DIAGNOSIS — E039 Hypothyroidism, unspecified: Secondary | ICD-10-CM | POA: Diagnosis not present

## 2020-12-04 NOTE — Telephone Encounter (Signed)
Rebecca Carr is calling stating she had a scan done on her lungs and was advised to call her heart Doctor and have him look over it.

## 2020-12-06 NOTE — Telephone Encounter (Signed)
I did take a look at the Chest CT the comments on coronary artery calcification.  Coronary calcification is indicative of active atherosclerotic disease.  Last year we did a heart catheterization that did not show any significant stenosis.  This would indicate that the calcification is most likely still in the lining of the arteries and not causing problems.  We would still want to treat as if she has atherosclerosis, but I do not think we need any further testing.  Glenetta Hew, MD

## 2020-12-09 NOTE — Telephone Encounter (Signed)
Pt notified and voiced understanding 

## 2020-12-09 NOTE — Telephone Encounter (Signed)
Called to notify pt. No answer. Left msg to call back.  

## 2020-12-18 DIAGNOSIS — Z20822 Contact with and (suspected) exposure to covid-19: Secondary | ICD-10-CM | POA: Diagnosis not present

## 2021-01-08 ENCOUNTER — Encounter: Payer: Self-pay | Admitting: Gastroenterology

## 2021-01-08 ENCOUNTER — Ambulatory Visit (INDEPENDENT_AMBULATORY_CARE_PROVIDER_SITE_OTHER): Payer: Medicare Other | Admitting: Gastroenterology

## 2021-01-08 VITALS — BP 112/72 | HR 78 | Ht 68.0 in | Wt 250.0 lb

## 2021-01-08 DIAGNOSIS — I2510 Atherosclerotic heart disease of native coronary artery without angina pectoris: Secondary | ICD-10-CM | POA: Diagnosis not present

## 2021-01-08 DIAGNOSIS — Z800 Family history of malignant neoplasm of digestive organs: Secondary | ICD-10-CM | POA: Diagnosis not present

## 2021-01-08 DIAGNOSIS — K219 Gastro-esophageal reflux disease without esophagitis: Secondary | ICD-10-CM | POA: Diagnosis not present

## 2021-01-08 DIAGNOSIS — Z86010 Personal history of colonic polyps: Secondary | ICD-10-CM

## 2021-01-08 DIAGNOSIS — I2584 Coronary atherosclerosis due to calcified coronary lesion: Secondary | ICD-10-CM | POA: Diagnosis not present

## 2021-01-08 DIAGNOSIS — R1310 Dysphagia, unspecified: Secondary | ICD-10-CM

## 2021-01-08 MED ORDER — PLENVU 140 G PO SOLR: 1.0000 | Freq: Once | ORAL | 0 refills | Status: AC

## 2021-01-08 NOTE — Progress Notes (Signed)
    History of Present Illness: This is a 70 year old female who relates solid food dysphagia for about 4 months.  Barium esophagram on 04/14/2018 showed a small hiatal hernia, reflux at the EGJ and no delay of a 12.5 mm barium tablet.  EGD with dilation in 08/2017 for dysphagia without a stricture and an empiric dilation was performed and the patient states she had relief of dysphagia symptoms for almost 3 years.   Current Medications, Allergies, Past Medical History, Past Surgical History, Family History and Social History were reviewed in [employer] record.   Physical Exam: General: Well developed, well nourished, no acute distress Head: Normocephalic and atraumatic Eyes: Sclerae anicteric, EOMI Ears: Normal auditory acuity Mouth: Not examined, mask on during Covid-19 pandemic Lungs: Clear throughout to auscultation Heart: Regular rate and rhythm; no murmurs, rubs or bruits Abdomen: Soft, non tender and non distended. No masses, hepatosplenomegaly or hernias noted. Normal Bowel sounds Rectal: Deferred to colonoscopy Musculoskeletal: Symmetrical with no gross deformities  Pulses:  Normal pulses noted Extremities: No clubbing, cyanosis, edema or deformities noted Neurological: Alert oriented x 4, grossly nonfocal Psychological:  Alert and cooperative. Normal mood and affect   Assessment and Recommendations:  1 . Personal history of adenomatous colon polyps. Family history of colon cancer.  Schedule surveillance colonoscopy. The risks (including bleeding, perforation, infection, missed lesions, medication reactions and possible hospitalization or surgery if complications occur), benefits, and alternatives to colonoscopy with possible biopsy and possible polypectomy were discussed with the patient and they consent to proceed.   2.  Dysphagia with solids that previously responded to empiric dilation.  GERD.  Follow antireflux measures and continue omeprazole 40 mg p.o.  daily with second dose in the evening daily as needed.  Schedule EGD with possible dilation. The risks (including bleeding, perforation, infection, missed lesions, medication reactions and possible hospitalization or surgery if complications occur), benefits, and alternatives to endoscopy with possible biopsy and possible dilation were discussed with the patient and they consent to proceed.

## 2021-01-08 NOTE — Patient Instructions (Signed)
You have been scheduled for an endoscopy and colonoscopy. Please follow the written instructions given to you at your visit today. Please pick up your prep supplies at the pharmacy within the next 1-3 days. If you use inhalers (even only as needed), please bring them with you on the day of your procedure.  Due to recent changes in healthcare laws, you may see the results of your imaging and laboratory studies on MyChart before your provider has had a chance to review them.  We understand that in some cases there may be results that are confusing or concerning to you. Not all laboratory results come back in the same time frame and the provider may be waiting for multiple results in order to interpret others.  Please give [place] 48 hours in order for your provider to thoroughly review all the results before contacting the office for clarification of your results.   The [facility] providers would like to encourage you to use [facility] to communicate with providers for non-urgent requests or questions.  Due to long hold times on the telephone, sending your provider a message by [facility] may be a faster and more efficient way to get a response.  Please allow 48 business hours for a response.  Please remember that this is for non-urgent requests.

## 2021-01-27 DIAGNOSIS — M359 Systemic involvement of connective tissue, unspecified: Secondary | ICD-10-CM | POA: Diagnosis not present

## 2021-01-27 DIAGNOSIS — R760 Raised antibody titer: Secondary | ICD-10-CM | POA: Diagnosis not present

## 2021-01-27 DIAGNOSIS — Z79899 Other long term (current) drug therapy: Secondary | ICD-10-CM | POA: Diagnosis not present

## 2021-01-28 DIAGNOSIS — R350 Frequency of micturition: Secondary | ICD-10-CM | POA: Diagnosis not present

## 2021-01-28 DIAGNOSIS — Z6839 Body mass index (BMI) 39.0-39.9, adult: Secondary | ICD-10-CM | POA: Diagnosis not present

## 2021-01-28 DIAGNOSIS — Z779 Other contact with and (suspected) exposures hazardous to health: Secondary | ICD-10-CM | POA: Diagnosis not present

## 2021-01-28 DIAGNOSIS — Z1231 Encounter for screening mammogram for malignant neoplasm of breast: Secondary | ICD-10-CM | POA: Diagnosis not present

## 2021-01-28 DIAGNOSIS — E039 Hypothyroidism, unspecified: Secondary | ICD-10-CM | POA: Diagnosis not present

## 2021-01-28 DIAGNOSIS — Z124 Encounter for screening for malignant neoplasm of cervix: Secondary | ICD-10-CM | POA: Diagnosis not present

## 2021-01-28 DIAGNOSIS — N958 Other specified menopausal and perimenopausal disorders: Secondary | ICD-10-CM | POA: Diagnosis not present

## 2021-01-28 DIAGNOSIS — M329 Systemic lupus erythematosus, unspecified: Secondary | ICD-10-CM | POA: Diagnosis not present

## 2021-01-28 DIAGNOSIS — M8588 Other specified disorders of bone density and structure, other site: Secondary | ICD-10-CM | POA: Diagnosis not present

## 2021-02-12 ENCOUNTER — Telehealth: Payer: Self-pay | Admitting: Cardiology

## 2021-02-12 ENCOUNTER — Other Ambulatory Visit: Payer: Self-pay

## 2021-02-12 MED ORDER — AMOXICILLIN 500 MG PO CAPS: ORAL_CAPSULE | ORAL | 2 refills | Status: AC

## 2021-02-12 NOTE — Telephone Encounter (Signed)
Patient called stating she is having a procedure done.  She needs her antibiotics called in to [facility] in [place].

## 2021-02-12 NOTE — Telephone Encounter (Signed)
Amoxicillin on file already for dental procedures.  Sent over to pharmacy.  Called patient LVM, advised it was sent and to which pharmacy-  Advised to call back if questions/concerns,.

## 2021-02-18 ENCOUNTER — Other Ambulatory Visit: Payer: Self-pay

## 2021-02-18 ENCOUNTER — Encounter: Payer: Self-pay | Admitting: Gastroenterology

## 2021-02-18 ENCOUNTER — Ambulatory Visit (AMBULATORY_SURGERY_CENTER): Payer: Medicare Other | Admitting: Gastroenterology

## 2021-02-18 VITALS — BP 106/62 | HR 62 | Temp 96.8°F | Resp 14 | Ht 68.0 in | Wt 250.0 lb

## 2021-02-18 DIAGNOSIS — R1310 Dysphagia, unspecified: Secondary | ICD-10-CM

## 2021-02-18 DIAGNOSIS — Z86010 Personal history of colonic polyps: Secondary | ICD-10-CM | POA: Diagnosis not present

## 2021-02-18 DIAGNOSIS — K449 Diaphragmatic hernia without obstruction or gangrene: Secondary | ICD-10-CM | POA: Diagnosis not present

## 2021-02-18 DIAGNOSIS — Z800 Family history of malignant neoplasm of digestive organs: Secondary | ICD-10-CM

## 2021-02-18 DIAGNOSIS — K219 Gastro-esophageal reflux disease without esophagitis: Secondary | ICD-10-CM

## 2021-02-18 MED ORDER — SODIUM CHLORIDE 0.9 % IV SOLN: 500.0000 mL | Freq: Once | INTRAVENOUS | Status: DC

## 2021-02-18 NOTE — Progress Notes (Signed)
Sedate, gd SR, tolerated procedure well, VSS, report to RN 

## 2021-02-18 NOTE — Op Note (Signed)
[facility] Patient Name: Rebecca Carr Procedure Date: 02/18/2021 1:08 PM MRN: [id number] Endoscopist: Ladene Artist , MD Age: 71 Referring MD:  Date of Birth: 1950-10-26 Gender: Female Account #: [id number] Procedure:                Colonoscopy Indications:              Surveillance: Personal history of adenomatous                            polyps on last colonoscopy 3 years ago, Family                            history of colon cancer, first degree relative. Medicines:                Monitored Anesthesia Care Procedure:                Pre-Anesthesia Assessment:                           - Prior to the procedure, a History and Physical                            was performed, and patient medications and                            allergies were reviewed. The patient's tolerance of                            previous anesthesia was also reviewed. The risks                            and benefits of the procedure and the sedation                            options and risks were discussed with the patient.                            All questions were answered, and informed consent                            was obtained. Prior Anticoagulants: The patient has                            taken no previous anticoagulant or antiplatelet                            agents. ASA Grade Assessment: II - A patient with                            mild systemic disease. After reviewing the risks                            and benefits, the patient was deemed in  satisfactory condition to undergo the procedure.                           After obtaining informed consent, the colonoscope                            was passed under direct vision. Throughout the                            procedure, the patient's blood pressure, pulse, and                            oxygen saturations were monitored continuously. The                            CF [id number] #[id number]  was introduced through the anus                            and advanced to the the cecum, identified by                            appendiceal orifice and ileocecal valve. The                            ileocecal valve, appendiceal orifice, and rectum                            were photographed. The quality of the bowel                            preparation was adequate. The colonoscopy was                            performed without difficulty. The patient tolerated                            the procedure well. Scope In: 2:09:29 PM Scope Out: 2:20:56 PM Scope Withdrawal Time: 0 hours 9 minutes 16 seconds  Total Procedure Duration: 0 hours 11 minutes 27 seconds  Findings:                 The perianal and digital rectal examinations were                            normal.                           Two small localized angiodysplastic lesions without                            bleeding were found in the cecum.                           Multiple medium-mouthed diverticula were found in  the sigmoid colon, descending colon and transverse                            colon. There was no evidence of diverticular                            bleeding.                           The exam was otherwise without abnormality on                            direct and retroflexion views. Complications:            No immediate complications. Estimated blood loss:                            None. Estimated Blood Loss:     Estimated blood loss: none. Impression:               - Two small cecal AVMs.                           - Moderate diverticulosis in the sigmoid colon, in                            the descending colon and in the transverse colon.                           - The examination was otherwise normal on direct                            and retroflexion views.                           - No specimens collected. Recommendation:           - Repeat colonoscopy in 5  years for surveillance.                           - Patient has a contact number available for                            emergencies. The signs and symptoms of potential                            delayed complications were discussed with the                            patient. Return to normal activities tomorrow.                            Written discharge instructions were provided to the                            patient.                           -  High fiber diet.                           - Continue present medications. Ladene Artist, MD 02/18/2021 2:34:30 PM This report has been signed electronically.

## 2021-02-18 NOTE — Patient Instructions (Signed)
Handouts on hiatal hernia, diverticulosis, and post dilation diet today  Await pathology results  YOU HAD AN ENDOSCOPIC PROCEDURE TODAY AT [facility]:   Refer to the procedure report that was given to you for any specific questions about what was found during the examination.  If the procedure report does not answer your questions, please call your gastroenterologist to clarify.  If you requested that your care partner not be given the details of your procedure findings, then the procedure report has been included in a sealed envelope for you to review at your convenience later.  YOU SHOULD EXPECT: Some feelings of bloating in the abdomen. Passage of more gas than usual.  Walking can help get rid of the air that was put into your GI tract during the procedure and reduce the bloating. If you had a lower endoscopy (such as a colonoscopy or flexible sigmoidoscopy) you may notice spotting of blood in your stool or on the toilet paper. If you underwent a bowel prep for your procedure, you may not have a normal bowel movement for a few days.  Please Note:  You might notice some irritation and congestion in your nose or some drainage.  This is from the oxygen used during your procedure.  There is no need for concern and it should clear up in a day or so.  SYMPTOMS TO REPORT IMMEDIATELY:  Following lower endoscopy (colonoscopy or flexible sigmoidoscopy):  Excessive amounts of blood in the stool  Significant tenderness or worsening of abdominal pains  Swelling of the abdomen that is new, acute  Fever of 100F or higher  Following upper endoscopy (EGD)  Vomiting of blood or coffee ground material  New chest pain or pain under the shoulder blades  Painful or persistently difficult swallowing  New shortness of breath  Fever of 100F or higher  Black, tarry-looking stools  For urgent or emergent issues, a gastroenterologist can be reached at any hour by calling [phone]. Do not  use MyChart messaging for urgent concerns.    DIET:  Dilation diet today, but then you may proceed to your regular diet.  Drink plenty of fluids but you should avoid alcoholic beverages for 24 hours.  ACTIVITY:  You should plan to take it easy for the rest of today and you should NOT DRIVE or use heavy machinery until tomorrow (because of the sedation medicines used during the test).    FOLLOW UP: Our staff will call the number listed on your records 48-72 hours following your procedure to check on you and address any questions or concerns that you may have regarding the information given to you following your procedure. If we do not reach you, we will leave a message.  We will attempt to reach you two times.  During this call, we will ask if you have developed any symptoms of COVID 19. If you develop any symptoms (ie: fever, flu-like symptoms, shortness of breath, cough etc.) before then, please call [phone].  If you test positive for Covid 19 in the 2 weeks post procedure, please call and report this information to [place].    If any biopsies were taken you will be contacted by phone or by letter within the next 1-3 weeks.  Please call us at [phone] if you have not heard about the biopsies in 3 weeks.    SIGNATURES/CONFIDENTIALITY: You and/or your care partner have signed paperwork which will be entered into your electronic medical record.  These signatures attest to  the fact that that the information above on your After Visit Summary has been reviewed and is understood.  Full responsibility of the confidentiality of this discharge information lies with you and/or your care-partner.

## 2021-02-18 NOTE — Progress Notes (Signed)
History & Physical  Primary Care Physician:  Garth Bigness (Inactive) Primary Gastroenterologist: Lucio Edward, MD  CHIEF COMPLAINT:  Personal history of colon polyps, family history of colon cancer, GERD and dysphagia  HPI: Rebecca Carr is a 71 y.o. female with a personal history of adenomatous colon polyps, family history of colon cancer, GERD and dysphagia for colonoscopy and EGD.here for colonoscopy.    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   Hashimoto's thyroiditis    Hiatal hernia    History of adenomatous polyp of colon    hyperplastic 2015  and tubular adenoma 2005   History of esophageal dilatation    of stricture   History of esophagitis    LA class , grade B and Gastritis   History of pituitary tumor    dx 1990's - asymptomatic /  per pt last scan 2005 approx.  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  Average heart rate 91 bpm.  Occasional PVCs.  One 12 and another 6 beat run of wide-complex tachycardia.  2 morphology PVCs.PVCs noted, but not during longer episode of WCT.   CESAREAN SECTION  1994   CLIPPING OF ATRIAL APPENDAGE N/A 04/13/2019   Procedure: Clipping Of Atrial Appendage using AtriCure 45 MM AtriClip.;  Surgeon: Rexene Alberts, MD;  Location: [facility];  Service: Open Heart Surgery;  Laterality: N/A;   CO2 LASER APPLICATION N/A 2/56/3893   Procedure: CO2 LASER APPLICATION ;  Surgeon: Everitt Amber, MD;  Location: [facility];  Service: Gynecology;  Laterality: N/A;   COLONOSCOPY, ESOPHAGOGASTRODUODENOSCOPY (EGD) AND ESOPHAGEAL DILATION  10-04-2013   LAPAROSCOPIC CHOLECYSTECTOMY  1995   LASER ABLATION CONDOLAMATA N/A 07/03/2015   Procedure: CO2 LASER OF VULVA ;  Surgeon: Everitt Amber, MD;  Location: [facility];  Service: Gynecology;  Laterality: N/A;   MITRAL VALVE REPAIR N/A 04/13/2019   Procedure: MITRAL VALVE REPAIR (MVR) using Memo 4D 34 MM Mitral Valve.;  Surgeon: Rexene Alberts, MD;  Location: [facility];  Service: Open Heart Surgery;  Laterality:  N/A;   NM MYOVIEW LTD  10/2016   LOW RISK.  No ischemia or infarction.  EF 54%.   RIGHT/LEFT HEART CATH AND CORONARY ANGIOGRAPHY N/A 04/10/2019   Procedure: RIGHT/LEFT HEART CATH AND CORONARY ANGIOGRAPHY;  Surgeon: Leonie Man, MD:: Angiographically normal coronary arteries.  Moderate-severe pulmonary hypertension with severe MR.  PAP-mean 67/27 mmHg - 44 mmHg, PCWP 26/62 mmHg-(V wave 62 mmHg).  RVP-EDP 66/14 mmHg - 22 mmHg, RAP mean 20 mmHg.  AoP-MAP 96/60 mmHg - 76 mmHg.  Fick CO-CI: 5.24-2.37. --> admitted for diuresis & MVR.   TEE WITHOUT CARDIOVERSION N/A 04/10/2019   Procedure:  TRANSESOPHAGEAL ECHOCARDIOGRAM (TEE);  Surgeon: Sueanne Margarita, MD;  Location: [facility] ENDOSCOPY::  EF 60 to 65%.  No R WMA.  Mildly reduced RV function.  Severe LA dilation.  Severe RA dilation.  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  Severe MR with eccentric anterior directed jet.  PV flow show systolic reversal.  Mild to moderate TR.  Mild AoV sclerosis w/o AS   TEE WITHOUT CARDIOVERSION N/A 04/13/2019   Procedure: TRANSESOPHAGEAL ECHOCARDIOGRAM (TEE);  Surgeon: Rexene Alberts, MD;  Location: [facility] OR: EF > 65% Preop-myxomatous MV with severe prolapse -flail P2 leaflet w/ ruptured chordae.  Severe MR-anterior.  Pulm V flow reversal.--> Post-OP: EF 40-50%.  No R WMA.  Mild LV dilation.  MV now repaired.  Trivial/no MR.  No MS (peak 7 mmHg, mean 22mmHg).  Annular ring intact - no leak   TRANSTHORACIC ECHOCARDIOGRAM  03/2019   EF 60 to 65%.  Elevated LAP and LVEDP.  GRII DD.  Severe LA dilation and moderate RA dilation.  Severe myxomatous proliferation of posterior MV leaflet with moderate calcification.  Anterior MV is mildly thickened.  Significant MVP of posterior leaflet at least moderate if not severe MR, directed towards IAS.  Mild MS-MVG of 5 mmHg.  Dilated IVC w/o Respiratory variability - RAP ~15 mmHg   TRANSTHORACIC ECHOCARDIOGRAM  04/09/2020   Normal EF 55 to 60%.  Normal wall motion.  Mild basal septal LVH, normal atrial sizes. Unable to assess diastolic function or PAP. s/p MVR with 34 mm annuloplasty ring and artificial neocord x 6.  Mild MR with no MS (mean gradient 4 mmHg).  No AI.  Mild aortic valve sclerosis...   TRANSTHORACIC ECHOCARDIOGRAM  09/10/2019   s/p MVR:  LVEF 50-55%.  Low normal.  Mild conc LVH.  Unable to assess DFxn.  Mildly elevated PA P with mildly dilated PA.  Moderate LA dilation.  Mild-moderate MR but no MS.  Mean MVG 3.5 mmHg at 79 bpm.  Prosthetic annuloplasty ring noted.  Mild aortic sclerosis with no stenosis.    TRANSTHORACIC ECHOCARDIOGRAM  04/09/2020   1  yr Post-OP MVR:  Normal EF 55 to 60%.  No RWMA.  Mild basal septal LVH.  Unable to assess diastolic function.  Unable to assess PA P.  S/p MVR w/ 34 mm annuloplasty ring and artificial Gore-Tex neocord x6 --> Mild MR.  No MS.  Mean gradient 4 mmHg.  No AI.  Mild AoV sclerosis.    Prior to Admission medications   Medication Sig Start Date End Date Taking? Authorizing Provider  amoxicillin (AMOXIL) 500 MG capsule Take 4 capsules 1 hour prior to dental appointment 02/12/21  Yes Leonie Man, MD  aspirin EC 81 MG tablet Take 81 mg by mouth at bedtime. Reported on 07/16/2015   Yes [provider]  atorvastatin (LIPITOR) 40 MG tablet Take 40 mg  by mouth at bedtime.    Yes [provider]  azelastine (ASTELIN) 0.1 % nasal spray Place 1 spray into both nostrils 2 (two) times daily. 04/08/20  Yes [provider]  Cyanocobalamin (VITAMIN B-12) 5000 MCG LOZG Take 5,000 mcg by mouth daily.   Yes [provider]  FLUoxetine (PROZAC) 40 MG capsule 1 capsule   Yes [provider]  fluticasone (FLONASE) 50 MCG/ACT nasal spray Place 2 sprays into both nostrils at bedtime.    Yes [provider]  furosemide (LASIX) 20 MG tablet Take 1 tablet (20 mg total) by mouth daily as needed. 07/02/20  Yes Leonie Man, MD  hydroxychloroquine (PLAQUENIL) 200 MG tablet 100 mg 2 (two) times daily. 03/21/20  Yes [provider]  levothyroxine (SYNTHROID) 125 MCG tablet Take 125 mcg by mouth daily before breakfast.   Yes [provider]  liothyronine (CYTOMEL) 5 MCG tablet Take 5 mcg by mouth daily. 07/07/17  Yes [provider]  metoprolol tartrate (LOPRESSOR) 25 MG tablet TAKE 1/2 TABLET BY MOUTH EVERY MORNING AND 1 TABLET BY MOUTH EVERY EVENING. MAY TAKE EXTRA DOSE IF NEEDED 10/26/19  Yes Leonie Man, MD  Multiple Vitamin (MULTIVITAMIN WITH MINERALS) TABS tablet Take 1 tablet by mouth daily. Multivitamin Essentials   Yes [provider]   nicotine polacrilex (COMMIT) 2 MG lozenge Take 2 mg by mouth as needed for smoking cessation.   Yes [provider]  omeprazole (PRILOSEC) 40 MG capsule Take 1 capsule (40 mg total) by mouth in the morning and at bedtime. 12/05/19  Yes Zehr, Laban Emperor, PA-C  potassium chloride (KLOR-CON) 10 MEQ tablet Take 1 tablet (10 mEq total) by mouth daily. 09/17/20  Yes Leonie Man, MD  Albuterol Sulfate (PROAIR RESPICLICK) 893 (90 Base) MCG/ACT AEPB Inhale into the lungs.    [provider]    Current Outpatient Medications  Medication Sig Dispense Refill   amoxicillin (AMOXIL) 500 MG capsule Take 4 capsules 1 hour prior to dental appointment 4 capsule 2   aspirin EC 81 MG tablet Take 81 mg by mouth at bedtime. Reported on 07/16/2015     atorvastatin (LIPITOR) 40 MG tablet Take 40 mg by mouth at bedtime.      azelastine (ASTELIN) 0.1 % nasal spray Place 1 spray into both nostrils 2 (two) times daily.     Cyanocobalamin (VITAMIN B-12) 5000 MCG LOZG Take 5,000 mcg by mouth daily.     FLUoxetine (PROZAC) 40 MG capsule 1 capsule     fluticasone (FLONASE) 50 MCG/ACT nasal spray Place 2 sprays into both nostrils at bedtime.      furosemide (LASIX) 20 MG tablet Take 1 tablet (20 mg total) by mouth daily as needed. 90 tablet 3   hydroxychloroquine (PLAQUENIL) 200 MG tablet 100 mg 2 (two) times daily.     levothyroxine (SYNTHROID) 125 MCG tablet Take 125 mcg by mouth daily before breakfast.     liothyronine (CYTOMEL) 5 MCG tablet Take 5 mcg by mouth daily.     metoprolol tartrate (LOPRESSOR) 25 MG tablet TAKE 1/2 TABLET BY MOUTH EVERY MORNING AND 1 TABLET BY MOUTH EVERY EVENING. MAY TAKE EXTRA DOSE IF NEEDED 160 tablet 3   Multiple Vitamin (MULTIVITAMIN WITH MINERALS) TABS tablet Take 1 tablet by mouth daily. Multivitamin Essentials     nicotine polacrilex (COMMIT) 2 MG lozenge Take 2 mg by mouth as needed for smoking cessation.     omeprazole (PRILOSEC) 40 MG capsule Take 1 capsule (40  mg  total) by mouth in the morning and at bedtime. 180 capsule 4   potassium chloride (KLOR-CON) 10 MEQ tablet Take 1 tablet (10 mEq total) by mouth daily. 90 tablet 1   Albuterol Sulfate (PROAIR RESPICLICK) 485 (90 Base) MCG/ACT AEPB Inhale into the lungs.     Current Facility-Administered Medications  Medication Dose Route Frequency Provider Last Rate Last Admin   0.9 %  sodium chloride infusion  500 mL Intravenous Once Ladene Artist, MD        Allergies as of 02/18/2021 - Review Complete 02/18/2021  Allergen Reaction Noted   Iodine Anaphylaxis 11/05/2008   Iohexol Anaphylaxis 10/01/2006   Penicillins Rash 12/18/2010    Family History  Problem Relation Age of Onset   Pancreatic cancer Father    Colon cancer Father    Heart attack Mother    Heart disease Mother    Heart attack Brother    COPD Brother    Dementia Brother    Colon cancer Maternal Grandfather    Rectal cancer Neg Hx    Stomach cancer Neg Hx    Esophageal cancer Neg Hx     Social History   Socioeconomic History   Marital status: Married    Spouse name: John   Number of children: 1   Years of education: Not on file   Highest education level: Not on file  Occupational History   Occupation: CUSTOMER SERVICE    Employer: [employer]    Comment: Is about to retire   Occupation: Retired  Tobacco Use   Smoking status: Former    Packs/day: 1.00    Years: 48.00    Pack years: 48.00    Types: Cigarettes    Quit date: 2017    Years since quitting: 6.0   Smokeless tobacco: Never   Tobacco comments:    using nicorrette  Vaping Use   Vaping Use: Never used  Substance and Sexual Activity   Alcohol use: Yes    Alcohol/week: 2.0 standard drinks    Types: 2 Standard drinks or equivalent per week    Comment: Social - > is now essentially quit   Drug use: No   Sexual activity: Not on file  Other Topics Concern   Not on file  Social History Narrative   No routine exercise. Describes herself as "lazy "    Social Determinants of Health   Financial Resource Strain: Not on file  Food Insecurity: Not on file  Transportation Needs: Not on file  Physical Activity: Not on file  Stress: Not on file  Social Connections: Not on file  Intimate Partner Violence: Not on file    Review of Systems:  All systems reviewed an negative except where noted in HPI.  Gen: Denies any fever, chills, sweats, anorexia, fatigue, weakness, malaise, weight loss, and sleep disorder CV: Denies chest pain, angina, palpitations, syncope, orthopnea, PND, peripheral edema, and claudication. Resp: Denies dyspnea at rest, dyspnea with exercise, cough, sputum, wheezing, coughing up blood, and pleurisy. GI: Denies vomiting blood, jaundice, and fecal incontinence.   Denies dysphagia or odynophagia. GU : Denies urinary burning, blood in urine, urinary frequency, urinary hesitancy, nocturnal urination, and urinary incontinence. MS: Denies joint pain, limitation of movement, and swelling, stiffness, low back pain, extremity pain. Denies muscle weakness, cramps, atrophy.  Derm: Denies rash, itching, dry skin, hives, moles, warts, or unhealing ulcers.  Psych: Denies depression, anxiety, memory loss, suicidal ideation, hallucinations, paranoia, and confusion. Heme: Denies bruising, bleeding, and enlarged lymph  nodes. Neuro:  Denies any headaches, dizziness, paresthesias. Endo:  Denies any problems with DM, thyroid, adrenal function.   Physical Exam: Vital signs in last 24 hours: General:  Alert, well-developed, in NAD Head:  Normocephalic and atraumatic. Eyes:  Sclera clear, no icterus.   Conjunctiva pink. Ears:  Normal auditory acuity. Mouth:  No deformity or lesions.  Neck:  Supple; no masses . Lungs:  Clear throughout to auscultation.   No wheezes, crackles, or rhonchi. No acute distress. Heart:  Regular rate and rhythm; no murmurs. Abdomen:  Soft, nondistended, nontender. No masses, hepatomegaly. No obvious masses.   Normal bowel .    Rectal:  Deferred   Msk:  Symmetrical without gross deformities.. Pulses:  Normal pulses noted. Extremities:  Without edema. Neurologic:  Alert and  oriented x4;  grossly normal neurologically. Skin:  Intact without significant lesions or rashes. Cervical Nodes:  No significant cervical adenopathy. Psych:  Alert and cooperative. Normal mood and affect.   Impression / Plan:   Personal history of adenomatous colon polyps, family history of colon cancer, GERD and dysphagia for colonoscopy and EGD.   Pricilla Riffle. Fuller Plan  02/18/2021, 1:58 PM See Shea Evans, [facility], to contact our on call provider

## 2021-02-18 NOTE — Progress Notes (Signed)
Pt's states no medical or surgical changes since previsit or office visit. 

## 2021-02-18 NOTE — Progress Notes (Signed)
Called to room to assist during endoscopic procedure.  Patient ID and intended procedure confirmed with present staff. Received instructions for my participation in the procedure from the performing physician.  

## 2021-02-18 NOTE — Op Note (Signed)
[facility] Patient Name: Rebecca Carr Procedure Date: 02/18/2021 1:07 PM MRN: [id number] Endoscopist: Ladene Artist , MD Age: 71 Referring MD:  Date of Birth: 1950-06-06 Gender: Female Account #: [id number] Procedure:                Upper GI endoscopy Indications:              Dysphagia, Gastroesophageal reflux disease Medicines:                Monitored Anesthesia Care Procedure:                Pre-Anesthesia Assessment:                           - Prior to the procedure, a History and Physical                            was performed, and patient medications and                            allergies were reviewed. The patient's tolerance of                            previous anesthesia was also reviewed. The risks                            and benefits of the procedure and the sedation                            options and risks were discussed with the patient.                            All questions were answered, and informed consent                            was obtained. Prior Anticoagulants: The patient has                            taken no previous anticoagulant or antiplatelet                            agents. ASA Grade Assessment: II - A patient with                            mild systemic disease. After reviewing the risks                            and benefits, the patient was deemed in                            satisfactory condition to undergo the procedure.                           After obtaining informed consent, the endoscope was  passed under direct vision. Throughout the                            procedure, the patient's blood pressure, pulse, and                            oxygen saturations were monitored continuously. The                            GIF [id number] #[id number] was introduced through the                            mouth, and advanced to the second part of duodenum.                            The upper GI  endoscopy was accomplished without                            difficulty. The patient tolerated the procedure                            well. Scope In: Scope Out: Findings:                 No endoscopic abnormality was evident in the                            esophagus to explain the patient's complaint of                            dysphagia. A guidewire was placed and the scope was                            withdrawn. Dilation was performed with a Savary                            dilator with no resistance at 17 mm. Biopsies were                            taken with a cold forceps for histology. Biopsies                            were taken with a cold forceps for histology.                           A small hiatal hernia was present.                           The exam of the stomach was otherwise normal.                           The duodenal bulb and second portion of the  duodenum were normal. Complications:            No immediate complications. Estimated Blood Loss:     Estimated blood loss was minimal. Impression:               - No endoscopic esophageal abnormality to explain                            patient's dysphagia. Dilated. Biopsied.                           - Small hiatal hernia.                           - Normal duodenal bulb and second portion of the                            duodenum. Recommendation:           - Patient has a contact number available for                            emergencies. The signs and symptoms of potential                            delayed complications were discussed with the                            patient. Return to normal activities tomorrow.                            Written discharge instructions were provided to the                            patient.                           - Clear liquid diet for 2 hours, then advance as                            tolerated to soft diet today.                            - Resume prior diet tomorrow.                           - Follow antireflux measures long term.                           - Continue present medications.                           - Await pathology results. Ladene Artist, MD 02/18/2021 2:38:14 PM This report has been signed electronically.

## 2021-02-20 ENCOUNTER — Telehealth: Payer: Self-pay | Admitting: *Deleted

## 2021-02-20 NOTE — Telephone Encounter (Signed)
°  Follow up Call-  Call back number 02/18/2021  Post procedure Call Back phone  # [phone]  Permission to leave phone message Yes  Some recent data might be hidden     Patient questions:  Do you have a fever, pain , or abdominal swelling? No. Pain Score  0 *  Have you tolerated food without any problems? Yes.    Have you been able to return to your normal activities? Yes.    Do you have any questions about your discharge instructions: Diet   No. Medications  No. Follow up visit  No.  Do you have questions or concerns about your Care? No.  Actions: * If pain score is 4 or above: No action needed, pain <4.

## 2021-03-02 ENCOUNTER — Encounter: Payer: Self-pay | Admitting: Gastroenterology

## 2021-03-02 DIAGNOSIS — Z20822 Contact with and (suspected) exposure to covid-19: Secondary | ICD-10-CM | POA: Diagnosis not present

## 2021-03-09 DIAGNOSIS — Z20828 Contact with and (suspected) exposure to other viral communicable diseases: Secondary | ICD-10-CM | POA: Diagnosis not present

## 2021-03-16 DIAGNOSIS — Z1152 Encounter for screening for COVID-19: Secondary | ICD-10-CM | POA: Diagnosis not present

## 2021-03-16 DIAGNOSIS — Z20828 Contact with and (suspected) exposure to other viral communicable diseases: Secondary | ICD-10-CM | POA: Diagnosis not present

## 2021-03-21 ENCOUNTER — Other Ambulatory Visit: Payer: Self-pay | Admitting: Cardiology

## 2021-03-23 DIAGNOSIS — Z20822 Contact with and (suspected) exposure to covid-19: Secondary | ICD-10-CM | POA: Diagnosis not present

## 2021-03-26 DIAGNOSIS — Z79899 Other long term (current) drug therapy: Secondary | ICD-10-CM | POA: Diagnosis not present

## 2021-03-26 DIAGNOSIS — H2512 Age-related nuclear cataract, left eye: Secondary | ICD-10-CM | POA: Diagnosis not present

## 2021-03-26 DIAGNOSIS — H04123 Dry eye syndrome of bilateral lacrimal glands: Secondary | ICD-10-CM | POA: Diagnosis not present

## 2021-04-10 DIAGNOSIS — Z20822 Contact with and (suspected) exposure to covid-19: Secondary | ICD-10-CM | POA: Diagnosis not present

## 2021-04-20 DIAGNOSIS — Z20822 Contact with and (suspected) exposure to covid-19: Secondary | ICD-10-CM | POA: Diagnosis not present

## 2021-04-25 ENCOUNTER — Other Ambulatory Visit: Payer: Self-pay | Admitting: Cardiology

## 2021-05-13 DIAGNOSIS — R309 Painful micturition, unspecified: Secondary | ICD-10-CM | POA: Diagnosis not present

## 2021-05-13 DIAGNOSIS — N760 Acute vaginitis: Secondary | ICD-10-CM | POA: Diagnosis not present

## 2021-05-15 DIAGNOSIS — Z20822 Contact with and (suspected) exposure to covid-19: Secondary | ICD-10-CM | POA: Diagnosis not present

## 2021-05-23 ENCOUNTER — Other Ambulatory Visit: Payer: Self-pay | Admitting: Cardiology

## 2021-05-26 ENCOUNTER — Ambulatory Visit (INDEPENDENT_AMBULATORY_CARE_PROVIDER_SITE_OTHER): Payer: Medicare Other

## 2021-05-26 DIAGNOSIS — Z87891 Personal history of nicotine dependence: Secondary | ICD-10-CM | POA: Diagnosis not present

## 2021-05-28 ENCOUNTER — Telehealth: Payer: Self-pay | Admitting: Acute Care

## 2021-05-28 ENCOUNTER — Other Ambulatory Visit: Payer: Self-pay

## 2021-05-28 DIAGNOSIS — Z20822 Contact with and (suspected) exposure to covid-19: Secondary | ICD-10-CM | POA: Diagnosis not present

## 2021-05-28 DIAGNOSIS — Z87891 Personal history of nicotine dependence: Secondary | ICD-10-CM

## 2021-05-28 DIAGNOSIS — Z122 Encounter for screening for malignant neoplasm of respiratory organs: Secondary | ICD-10-CM

## 2021-05-28 NOTE — Telephone Encounter (Signed)
Contacted patient by phone. Follow up scan shows area of concern resolving.  NO suspicious findings for lung cancer.  New notation of pulmonary artery hypertension.  Patient states she sees cardiology and requests CT results be sent to him to discuss.  Patient acknowledged understanding and had no further questions.  Will return to yearly CT Chest scans.  Order placed for 2024.   ?

## 2021-05-29 ENCOUNTER — Encounter: Payer: Self-pay | Admitting: Cardiology

## 2021-05-29 ENCOUNTER — Telehealth: Payer: Self-pay | Admitting: Cardiology

## 2021-05-29 NOTE — Telephone Encounter (Signed)
Returned patient's call. She wants to discuss with Dr. Ellyn Hack her lung ct results. Patient reports that for 2 days she has chest pain she describes as pressure in the middle to left side of chest, sob (which is audible over the phone), and dizziness. Spoke with DOD Dr. Margaretann Loveless who recommended patient go to the ED. I called patient back and explained why she should go to the ED. She said she would have a friend drive her there. ?

## 2021-05-29 NOTE — Telephone Encounter (Signed)
Spoke with pt, made aware aortic calcification is not uncommon as we age and dr harding is not in the office today. She reports she wopuld like to be seen by someone for the dizziness and chest pain she is having occasionally. She is currently pain free and reports it comes and goes. Follow up scheduled with NP next week.  ?

## 2021-05-29 NOTE — Telephone Encounter (Signed)
Patient called to talk with Dr. Ellyn Hack and nurse about conversation from yesterday.    ?                                                                                               ?

## 2021-05-30 NOTE — Telephone Encounter (Signed)
I have looked at the CT scan findings.  There is nothing on the CT scan about cardiovascular issues that we would not avoid known about.  Aortic and coronary calcification were noted, but she had no significant coronary disease on cardiac catheterization prior to her surgery. ? ?We just continue to treat risk factors. ? ?Any more detailed discussion would probably best be done in a clinic visit. ? ?Rebecca Hew, MD ? ?

## 2021-05-31 NOTE — Progress Notes (Deleted)
Cardiology Clinic Note   Patient Name: Rebecca Carr Date of Encounter: 05/31/2021  Primary Care Provider:  Aurea Graff, PA-C (Inactive) Primary Cardiologist:  Glenetta Hew, MD  Patient Profile    Rebecca Carr   Past Medical History    Past Medical History:  Diagnosis Date   Antiphospholipid syndrome [facility])    Anxiety    Arthritis    Chronic kidney disease (CKD), active medical management without dialysis, stage 3 (moderate) ([place])    Clotting disorder ([place])    COPD (chronic obstructive pulmonary disease) ([place])    DES exposure in utero    Diverticulosis of colon    Dizziness    Environmental and seasonal allergies    GERD (gastroesophageal reflux disease)    H/O mitral valve prolapse-severe Barlow's type myxomatous change with severe MR 2021   First diagnosed in 2012, -> progressed to severe in 2021-now status post MVR.   Hashimoto's thyroiditis    Hiatal hernia    History of adenomatous polyp of colon    hyperplastic 2015  and tubular adenoma 2005   History of esophageal dilatation    of stricture   History of esophagitis    LA class , grade B and Gastritis   History of pituitary tumor    dx 1990's - asymptomatic /  per pt last scan 2005 approx.  resolved    Hyperlipidemia    Hypothyroidism    Leg pain, bilateral    Lupus (HCC)    Mild intermittent asthma    MTHFR mutation    OSA (obstructive sleep apnea)    non-compliant cpap   Prediabetes    S/P mitral valve repair 04/13/2019   Complex valvuloplasty including triangular resection of flail posterior leaflet + artificial Gore-tex neochords x6 and 34 mm Memo 4D ring annuloplasty   SUI (stress urinary incontinence, female)    VIN III (vulvar intraepithelial neoplasia III)    Wears glasses    Past Surgical History:  Procedure Laterality Date   BUBBLE STUDY  04/10/2019   Procedure: BUBBLE STUDY;  Surgeon: Sueanne Margarita, MD;  Location: [facility];  Service: Cardiovascular;;   CARDIAC EVENT  MONITOR  08/2016   Normal sinus rhythm with sinus tachycardia.  Average heart rate 91 bpm.  Occasional PVCs.  One 12 and another 6 beat run of wide-complex tachycardia.  2 morphology PVCs.PVCs noted, but not during longer episode of WCT.   CESAREAN SECTION  1994   CLIPPING OF ATRIAL APPENDAGE N/A 04/13/2019   Procedure: Clipping Of Atrial Appendage using AtriCure 45 MM AtriClip.;  Surgeon: Rexene Alberts, MD;  Location: [place];  Service: Open Heart Surgery;  Laterality: N/A;   CO2 LASER APPLICATION N/A 4/68/0321   Procedure: CO2 LASER APPLICATION ;  Surgeon: Everitt Amber, MD;  Location: [facility];  Service: Gynecology;  Laterality: N/A;   COLONOSCOPY, ESOPHAGOGASTRODUODENOSCOPY (EGD) AND ESOPHAGEAL DILATION  10-04-2013   LAPAROSCOPIC CHOLECYSTECTOMY  1995   LASER ABLATION CONDOLAMATA N/A 07/03/2015   Procedure: CO2 LASER OF VULVA ;  Surgeon: Everitt Amber, MD;  Location: [place];  Service: Gynecology;  Laterality: N/A;   MITRAL VALVE REPAIR N/A 04/13/2019   Procedure: MITRAL VALVE REPAIR (MVR) using Memo 4D 34 MM Mitral Valve.;  Surgeon: Rexene Alberts, MD;  Location: [place];  Service: Open Heart Surgery;  Laterality: N/A;   NM MYOVIEW LTD  10/2016   LOW RISK.  No ischemia or infarction.  EF 54%.   RIGHT/LEFT HEART  CATH AND CORONARY ANGIOGRAPHY N/A 04/10/2019   Procedure: RIGHT/LEFT HEART CATH AND CORONARY ANGIOGRAPHY;  Surgeon: Leonie Man, MD:: Angiographically normal coronary arteries.  Moderate-severe pulmonary hypertension with severe MR.  PAP-mean 67/27 mmHg - 44 mmHg, PCWP 26/62 mmHg-(V wave 62 mmHg).  RVP-EDP 66/14 mmHg - 22 mmHg, RAP mean 20 mmHg.  AoP-MAP 96/60 mmHg - 76 mmHg.  Fick CO-CI: 5.24-2.37. --> admitted for diuresis & MVR.   TEE WITHOUT CARDIOVERSION N/A 04/10/2019   Procedure: TRANSESOPHAGEAL ECHOCARDIOGRAM (TEE);  Surgeon: Sueanne Margarita, MD;  Location: [facility] ENDOSCOPY::  EF 60 to 65%.  No R WMA.  Mildly reduced RV function.  Severe LA dilation.   Severe RA dilation.  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  Severe MR with eccentric anterior directed jet.  PV flow show systolic reversal.  Mild to moderate TR.  Mild AoV sclerosis w/o AS   TEE WITHOUT CARDIOVERSION N/A 04/13/2019   Procedure: TRANSESOPHAGEAL ECHOCARDIOGRAM (TEE);  Surgeon: Rexene Alberts, MD;  Location: [facility] OR: EF > 65% Preop-myxomatous MV with severe prolapse -flail P2 leaflet w/ ruptured chordae.  Severe MR-anterior.  Pulm V flow reversal.--> Post-OP: EF 40-50%.  No R WMA.  Mild LV dilation.  MV now repaired.  Trivial/no MR.  No MS (peak 7 mmHg, mean 74mHg).  Annular ring intact - no leak   TRANSTHORACIC ECHOCARDIOGRAM  03/2019   EF 60 to 65%.  Elevated LAP and LVEDP.  GRII DD.  Severe LA dilation and moderate RA dilation.  Severe myxomatous proliferation of posterior MV leaflet with moderate calcification.  Anterior MV is mildly thickened.  Significant MVP of posterior leaflet at least moderate if not severe MR, directed towards IAS.  Mild MS-MVG of 5 mmHg.  Dilated IVC w/o Respiratory variability - RAP ~15 mmHg   TRANSTHORACIC ECHOCARDIOGRAM  04/09/2020   Normal EF 55 to 60%.  Normal wall motion.  Mild basal septal LVH, normal atrial sizes. Unable to assess diastolic function or PAP. s/p MVR with 34 mm annuloplasty ring and artificial neocord x 6.  Mild MR with no MS (mean gradient 4 mmHg).  No AI.  Mild aortic valve sclerosis...   TRANSTHORACIC ECHOCARDIOGRAM  09/10/2019   s/p MVR:  LVEF 50-55%.  Low normal.  Mild conc LVH.  Unable to assess DFxn.  Mildly elevated PA P with mildly dilated PA.  Moderate LA dilation.  Mild-moderate MR but no MS.  Mean MVG 3.5 mmHg at 79 bpm.  Prosthetic annuloplasty ring noted.  Mild aortic sclerosis with no stenosis.    TRANSTHORACIC ECHOCARDIOGRAM  04/09/2020   1 yr Post-OP MVR:  Normal EF 55 to 60%.  No RWMA.  Mild basal septal LVH.  Unable to assess diastolic function.  Unable to assess PA P.  S/p MVR w/ 34 mm annuloplasty ring  and artificial Gore-Tex neocord x6 --> Mild MR.  No MS.  Mean gradient 4 mmHg.  No AI.  Mild AoV sclerosis.    Allergies  Allergies  Allergen Reactions   Iodine Anaphylaxis   Iohexol Anaphylaxis     Desc: Patient states she is allergic to iodine and had a "code blue" incident after an injection for some type of imaging study.    Penicillins Rash    PCN IN LARGE DOSES. Did it involve swelling of the face/tongue/throat, SOB, or low BP? No Did it involve sudden or severe rash/hives, skin peeling, or any reaction on the inside of your mouth or nose? No Did you need to seek medical  attention at a hospital or doctor's office? No When did it last happen? More than 10 years ago    If all above answers are "NO", may proceed with cephalosporin use.     History of Present Illness    ***  Home Medications    Prior to Admission medications   Medication Sig Start Date End Date Taking? Authorizing Provider  Albuterol Sulfate (PROAIR RESPICLICK) 660 (90 Base) MCG/ACT AEPB Inhale into the lungs.    [provider]  amoxicillin (AMOXIL) 500 MG capsule Take 4 capsules 1 hour prior to dental appointment 02/12/21   Leonie Man, MD  aspirin EC 81 MG tablet Take 81 mg by mouth at bedtime. Reported on 07/16/2015    [provider]  atorvastatin (LIPITOR) 40 MG tablet Take 40 mg by mouth at bedtime.     [provider]  azelastine (ASTELIN) 0.1 % nasal spray Place 1 spray into both nostrils 2 (two) times daily. 04/08/20   [provider]  Cyanocobalamin (VITAMIN B-12) 5000 MCG LOZG Take 5,000 mcg by mouth daily.    [provider]  FLUoxetine (PROZAC) 40 MG capsule 1 capsule    [provider]  fluticasone (FLONASE) 50 MCG/ACT nasal spray Place 2 sprays into both nostrils at bedtime.     [provider]  furosemide (LASIX) 20 MG tablet Take 1 tablet (20 mg total) by mouth daily as needed. 07/02/20   Leonie Man, MD  hydroxychloroquine  (PLAQUENIL) 200 MG tablet 100 mg 2 (two) times daily. 03/21/20   [provider]  levothyroxine (SYNTHROID) 125 MCG tablet Take 125 mcg by mouth daily before breakfast.    [provider]  liothyronine (CYTOMEL) 5 MCG tablet Take 5 mcg by mouth daily. 07/07/17   [provider]  metoprolol tartrate (LOPRESSOR) 25 MG tablet TAKE 1/2 TABLET BY MOUTH EVERY MORNING AND 1 TABLET BY MOUTH EVERY EVENING. MAY TAKE EXTRA DOSE IF NEEDED 10/26/19   Leonie Man, MD  Multiple Vitamin (MULTIVITAMIN WITH MINERALS) TABS tablet Take 1 tablet by mouth daily. Multivitamin Essentials    [provider]  nicotine polacrilex (COMMIT) 2 MG lozenge Take 2 mg by mouth as needed for smoking cessation.    [provider]  omeprazole (PRILOSEC) 40 MG capsule Take 1 capsule (40 mg total) by mouth in the morning and at bedtime. 12/05/19   Zehr, Laban Emperor, PA-C  potassium chloride (KLOR-CON) 10 MEQ tablet Take 1 tablet (10 mEq total) by mouth daily. PATIENT MUST SCHEDULE ANNUAL APPOINTMENT FOR FUTURE REFILLS. LAST ATTEMPT 05/25/21   Leonie Man, MD    Family History    Family History  Problem Relation Age of Onset   Pancreatic cancer Father    Colon cancer Father    Heart attack Mother    Heart disease Mother    Heart attack Brother    COPD Brother    Dementia Brother    Colon cancer Maternal Grandfather    Rectal cancer Neg Hx    Stomach cancer Neg Hx    Esophageal cancer Neg Hx    She indicated that her mother is deceased. She indicated that her father is deceased. She indicated that her brother is deceased. She indicated that her maternal grandmother is deceased. She indicated that her maternal grandfather is deceased. She indicated that her paternal grandmother is deceased. She indicated that her paternal grandfather is deceased. She indicated that her son is alive. She indicated that the status of her  neg hx is unknown.  Social History    Social History    Socioeconomic History   Marital status: Married    Spouse name: John   Number of children: 1   Years of education: Not on file   Highest education level: Not on file  Occupational History   Occupation: CUSTOMER SERVICE    Employer: [employer]    Comment: Is about to retire   Occupation: Retired  Tobacco Use   Smoking status: Former    Packs/day: 1.00    Years: 48.00    Pack years: 48.00    Types: Cigarettes    Quit date: 2017    Years since quitting: 6.3   Smokeless tobacco: Never   Tobacco comments:    using nicorrette  Vaping Use   Vaping Use: Never used  Substance and Sexual Activity   Alcohol use: Yes    Alcohol/week: 2.0 standard drinks    Types: 2 Standard drinks or equivalent per week    Comment: Social - > is now essentially quit   Drug use: No   Sexual activity: Not on file  Other Topics Concern   Not on file  Social History Narrative   No routine exercise. Describes herself as "lazy "   Social Determinants of Health   Financial Resource Strain: Not on file  Food Insecurity: Not on file  Transportation Needs: Not on file  Physical Activity: Not on file  Stress: Not on file  Social Connections: Not on file  Intimate Partner Violence: Not on file     Review of Systems    General:  No chills, fever, night sweats or weight changes.  Cardiovascular:  No chest pain, dyspnea on exertion, edema, orthopnea, palpitations, paroxysmal nocturnal dyspnea. Dermatological: No rash, lesions/masses Respiratory: No cough, dyspnea Urologic: No hematuria, dysuria Abdominal:   No nausea, vomiting, diarrhea, bright red blood per rectum, melena, or hematemesis Neurologic:  No visual changes, wkns, changes in mental status. All other systems reviewed and are otherwise negative except as noted above.  Physical Exam    VS:  There were no vitals taken for this visit. , BMI There is no height or weight on file to calculate BMI. GEN: Well nourished, well developed, in  no acute distress. HEENT: normal. Neck: Supple, no JVD, carotid bruits, or masses. Cardiac: RRR, no murmurs, rubs, or gallops. No clubbing, cyanosis, edema.  Radials/DP/PT 2+ and equal bilaterally.  Respiratory:  Respirations regular and unlabored, clear to auscultation bilaterally. GI: Soft, nontender, nondistended, BS + x 4. MS: no deformity or atrophy. Skin: warm and dry, no rash. Neuro:  Strength and sensation are intact. Psych: Normal affect.  Accessory Clinical Findings    Recent Labs: No results found for requested labs within last 8760 hours.   Recent Lipid Panel No results found for: CHOL, TRIG, HDL, CHOLHDL, VLDL, LDLCALC, LDLDIRECT  ECG personally reviewed by me today- *** - No acute changes  Cardiac catheterization 04/10/2019 Hemodynamic findings consistent with MODERATE-SEVERE PULMONARY HYPERTENSION and SEVERE MITRAL VALVE REGURGITATION. There is no aortic valve stenosis. Angiographically normal coronary arteries   SUMMARY Angiographically Normal Coronary Arteries Severe mitral regurgitation by TEE and with PCWP V wave of 62 Severe secondary pulmonary hypertension secondary to mitral valve regurgitation: PA pressure 67/27 mmHg with mean 43 mmHg.  PCWP mean 33 mmHg.  RA mean 17 mmHg Concern for worsening ACUTE ON CHRONIC DIASTOLIC HEART FAILURE   RECOMMENDATION Given the severity of the pulmonary hypertension and mitral valve regurgitation, the best course  of action is to meet the patient for IV diuresis with plan for inpatient mitral valve repair.  I have discussed the patient with Dr. Aundra Dubin from advanced heart failure service was agreed to accept the patient on their service for IV diuresis. We will start IV Lasix 80 mg twice daily Continue current home dose beta-blocker and other home medications.  Blood pressure may be limited. CVTS consultation has been placed.     Glenetta Hew, MD  Diagnostic Dominance: Right Intervention   Chest CT  05/26/2021  FINDINGS: Cardiovascular: Atherosclerotic calcification of the aorta, aortic valve and coronary arteries. Enlarged pulmonic trunk. Heart is at the upper limits of normal in size. No pericardial effusion.   Mediastinum/Nodes: No pathologically enlarged mediastinal or axillary lymph nodes. Hilar regions are difficult to definitively evaluate without IV contrast. Esophagus is grossly unremarkable.   Lungs/Pleura: Mild centrilobular emphysema. Scattered pulmonary parenchymal scarring. Calcified granulomas. Previously seen pulmonary nodules have resolved in the interval. No suspicious pulmonary nodules. No pleural fluid. Airway is unremarkable.   Upper Abdomen: Visualized portions of the liver, adrenal glands, kidneys, spleen, pancreas and stomach are grossly unremarkable with exception a small hiatal hernia. Duodenal diverticulum is partially imaged. Cholecystectomy. No upper abdominal adenopathy.   Musculoskeletal: Degenerative changes in the spine. No worrisome lytic or sclerotic lesions.   IMPRESSION: 1. Lung-RADS 1, negative. Continue annual screening with low-dose chest CT without contrast in 12 months. 2. Aortic atherosclerosis (ICD10-I70.0). Coronary artery calcification. 3. Enlarged pulmonic trunk, indicative of pulmonary arterial hypertension. 4.  Emphysema (ICD10-J43.9).     Electronically Signed   By: Lorin Picket M.D.   On: 05/27/2021 08:48   Assessment & Plan   1.  Abnormal chest CT-noted aortic atherosclerosis and coronary calcification.  Chest CT reviewed.  Myoview and cardiac catheterization 2021 which showed normal coronaries.  Continue aspirin, atorvastatin, Heart healthy low-sodium diet-salty 6 given Increase physical activity as tolerated  Coronary artery calcification-denies recent episodes of arm neck back or chest discomfort.  Underwent Myoview and cardiac catheterization in 2021 which showed normal coronary anatomy. Continue aspirin,  atorvastatin, metoprolol Heart healthy low-sodium diet-salty 6 given Increase physical activity as tolerated   Hyperlipidemia-LDL*** Continue aspirin, atorvastatin Heart healthy low-sodium high-fiber diet Increase physical activity as tolerated Repeat fasting lipids and LFTs  Chronic diastolic CHF-no increased DOE or activity intolerance.  Underwent MVR.  Blood pressure remains well controlled with metoprolol. Continue furosemide, metoprolol Heart healthy low-sodium diet-salty 6 given Increase physical activity as tolerated Daily weights  Mitral valve prolapse-status post mitral valve repair 2021.  Follow-up echocardiogram should stable mitral valve. Repeat echocardiogram  Palpitations-has not noticed any further episodes. Continue metoprolol Continue to monitor  OSA-reports compliance with CPAP. Continue CPAP use Follows with pulmonary medicine  Disposition: Follow-up with Dr. Ellyn Hack in 1 year.    Jossie Ng. Mayah Urquidi NP-C    05/31/2021, 9:34 AM [place] [address] Office [phone] Fax [phone]  Notice: This dictation was prepared with Dragon dictation along with smaller phrase technology. Any transcriptional errors that result from this process are unintentional and may not be corrected upon review.  I spent***minutes examining this patient, reviewing medications, and using patient centered shared decision making involving her cardiac care.  Prior to her visit I spent greater than 20 minutes reviewing her past medical history,  medications, and prior cardiac tests.

## 2021-06-01 ENCOUNTER — Ambulatory Visit: Payer: Medicare Other | Admitting: General Practice

## 2021-06-01 NOTE — Telephone Encounter (Signed)
Spoke with patient and gave there Dr. Allison Quarry notes regarding CT scan findings. Patient has appointment with Thomasene Mohair this afternoon. ?

## 2021-06-02 ENCOUNTER — Ambulatory Visit: Payer: Medicare Other | Admitting: General Practice

## 2021-06-03 DIAGNOSIS — Z20822 Contact with and (suspected) exposure to covid-19: Secondary | ICD-10-CM | POA: Diagnosis not present

## 2021-06-04 DIAGNOSIS — N183 Chronic kidney disease, stage 3 unspecified: Secondary | ICD-10-CM | POA: Diagnosis not present

## 2021-06-04 DIAGNOSIS — K219 Gastro-esophageal reflux disease without esophagitis: Secondary | ICD-10-CM | POA: Diagnosis not present

## 2021-06-04 DIAGNOSIS — I4891 Unspecified atrial fibrillation: Secondary | ICD-10-CM | POA: Diagnosis not present

## 2021-06-04 DIAGNOSIS — F439 Reaction to severe stress, unspecified: Secondary | ICD-10-CM | POA: Diagnosis not present

## 2021-06-04 DIAGNOSIS — E039 Hypothyroidism, unspecified: Secondary | ICD-10-CM | POA: Diagnosis not present

## 2021-06-04 DIAGNOSIS — E782 Mixed hyperlipidemia: Secondary | ICD-10-CM | POA: Diagnosis not present

## 2021-06-08 DIAGNOSIS — Z20822 Contact with and (suspected) exposure to covid-19: Secondary | ICD-10-CM | POA: Diagnosis not present

## 2021-06-09 ENCOUNTER — Encounter: Payer: Self-pay | Admitting: General Practice

## 2021-06-15 DIAGNOSIS — Z20822 Contact with and (suspected) exposure to covid-19: Secondary | ICD-10-CM | POA: Diagnosis not present

## 2021-06-16 DIAGNOSIS — Z23 Encounter for immunization: Secondary | ICD-10-CM | POA: Diagnosis not present

## 2021-06-23 ENCOUNTER — Ambulatory Visit: Payer: Medicare Other | Admitting: Cardiology

## 2021-06-24 ENCOUNTER — Encounter: Payer: Self-pay | Admitting: Cardiology

## 2021-06-24 ENCOUNTER — Ambulatory Visit (INDEPENDENT_AMBULATORY_CARE_PROVIDER_SITE_OTHER): Payer: Medicare Other | Admitting: Cardiology

## 2021-06-24 VITALS — BP 132/94 | HR 67 | Ht 68.0 in | Wt 252.0 lb

## 2021-06-24 DIAGNOSIS — I2584 Coronary atherosclerosis due to calcified coronary lesion: Secondary | ICD-10-CM

## 2021-06-24 DIAGNOSIS — R002 Palpitations: Secondary | ICD-10-CM | POA: Diagnosis not present

## 2021-06-24 DIAGNOSIS — I2510 Atherosclerotic heart disease of native coronary artery without angina pectoris: Secondary | ICD-10-CM

## 2021-06-24 DIAGNOSIS — I340 Nonrheumatic mitral (valve) insufficiency: Secondary | ICD-10-CM

## 2021-06-24 DIAGNOSIS — I4891 Unspecified atrial fibrillation: Secondary | ICD-10-CM

## 2021-06-24 DIAGNOSIS — I5032 Chronic diastolic (congestive) heart failure: Secondary | ICD-10-CM

## 2021-06-24 DIAGNOSIS — Z9889 Other specified postprocedural states: Secondary | ICD-10-CM

## 2021-06-24 DIAGNOSIS — I9789 Other postprocedural complications and disorders of the circulatory system, not elsewhere classified: Secondary | ICD-10-CM

## 2021-06-24 DIAGNOSIS — G4733 Obstructive sleep apnea (adult) (pediatric): Secondary | ICD-10-CM | POA: Diagnosis not present

## 2021-06-24 DIAGNOSIS — I341 Nonrheumatic mitral (valve) prolapse: Secondary | ICD-10-CM | POA: Diagnosis not present

## 2021-06-24 MED ORDER — FUROSEMIDE 20 MG PO TABS: 20.0000 mg | ORAL_TABLET | Freq: Every day | ORAL | 3 refills | Status: AC

## 2021-06-24 NOTE — Progress Notes (Signed)
Primary Care Provider: Garth Bigness (Inactive) Cardiologist: Glenetta Hew, MD Electrophysiologist: None  Clinic Note: Chief Complaint  Patient presents with   Follow-up    Delayed annual follow-up-has questions about chest CT   Cardiac Valve Problem    Status post mitral repair for severe MVP/MR    ===================================  ASSESSMENT/PLAN   Problem List Items Addressed This Visit       Cardiology Problems   Severe mitral regurgitation due to severe cusp prolapse (Chronic)    Rapid regression of mitral valve disease with Barlow's type myxomatous changes noted.  She has severe MR with significant CHF at the time. She has done remarkably well since her surgery.  Follow-up echo so shown normal functioning valve no significant MR or MS.  Overall doing fairly well. She probably does not need a new echocardiogram at this point time with exertion of exercise and dyspnea.  I will hold off on checking an echocardiogram here as she is berry moved to [place] area and would likely be established with a cardiologist there.  I mentioned to her that the cardiologist would probably want to check an echocardiogram to have available in their system.  As such, I will hold off on having an echocardiogram checked here.       Relevant Medications   furosemide (LASIX) 20 MG tablet   Other Relevant Orders   EKG 12-Lead (Completed)   Chronic diastolic heart failure (HCC) (Chronic)    She had significant CHF prior to her surgery, now notably improved.  She does have edema which seems to be pretty well controlled with 20 mg of Lasix.  No real PND orthopnea although difficult to tell based on body habitus the need for CPAP.  For now we will simply continue beta-blocker.  Low threshold to titrate to 20 mg twice daily.  Neck step would be to add afterload reduction with ARB.  Change Lasix to standing dose with additional dose PRN.  With additional doses, she should take  potassium supplementation.       Relevant Medications   furosemide (LASIX) 20 MG tablet   Coronary artery calcification of native artery - Primary (Chronic)    She has now had 2 CT scans that suggest multivessel calcification.  I reassured her that she has had a negative Myoview/nonischemic and essentially normal coronary arteries by catheterization with similar findings on CT scan.  I do think she needs to be aggressive with lipid management.  Unfortunately do not have the results of her recent labs.  Aggressive risk factor modification, blood pressure, lipids and glycemic control.  Also increase exercise and weight loss.  Plan: 81 mg aspirin On atorvastatin 40 mg.  Targeting LDL less than 70 Continue metoprolol current dose however had low threshold to titrate up to 25 mg twice daily. Monitor BP, low threshold to titrate beta-blocker up versus addition of afterload reduction Continue current dose of furosemide and make a standing dose along with Klor-Con.      Relevant Medications   furosemide (LASIX) 20 MG tablet   Other Relevant Orders   EKG 12-Lead (Completed)   Postoperative atrial fibrillation (HCC) (Chronic)    No further episodes of A-fib since surgery.  As such, we have opted to avoid DOAC. She is on beta-blocker and aspirin.  If she were to have recurrence of A-fib, would need to switch over to [place].       Relevant Medications   furosemide (LASIX) 20 MG tablet   Other Relevant  Orders   EKG 12-Lead (Completed)     Other   Palpitations (Chronic)    Seems to have relatively infrequent palpitations.  She is on relatively low-dose metoprolol which could be titrated up if necessary.  She takes 12 mg in the morning and 25 mg in the evening.  We did not titrate up further because of fatigue issues in the past, but I think she may very well be tolerate whole 25 mg twice daily.    For now just continue as is, but if palpitations increase, would titrate up.       Relevant  Orders   EKG 12-Lead (Completed)   Obstructive sleep apnea (Chronic)    Not currently using CPAP--I truly think she would do a lot better if she were to use CPAP, she would have more energy and sleep better.  I recommended that when she gets established in [place] but she goes forward with getting reassessed and set up with a CPAP machine.       Relevant Orders   EKG 12-Lead (Completed)   S/P mitral valve repair (Chronic)    With a prosthetic ring on the mitral valve status post repair, she should still have backup SBE prophylaxis.  Currently has an amoxicillin prescription pending.      Relevant Orders   EKG 12-Lead (Completed)    ===================================  HPI:    Rebecca Carr is a 71 y.o. female with a PMH notable for Severe MVP-MR-s/p MVR along with Antiphospholipid Antibody Syndrome and COPD who presents today for delayed annual follow-up.  CARDIAC HISTORY Progressive MVP -> Severe /symptomatic MR noted in February 2021 --> identified as Barlow's Type Myxomatous Change Intra-Op 04/10/2019-TEE: EF 60 to 65%.  No R WMA.  Severe LA dilation.  Severe RA dilation.  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  Severe MR with eccentric anterior directed jet.  PV flow show systolic reversal. ; R&LHC - severely elevated PAP, PCP 33 mmHg with V wave of 62 mmHg from MR. - 04/10/3019: Admitted to [facility] following Pre-Op R&LHC / TEE ---> Aggressively diuresed with inotropic support -> urgent MVR 0/10/3265 -complicated MVR (Dr. Roxy Manns)  [place] Memo 4D ring Annuloplasty (34 mm), artificial Gore-Tex-neocord replacement x 6, LAA clipping.  Post-extubation acute respiratory failure - re-intubated --> IV diuresis, Inhaled NO --> post-op Afib - Rx with Amiodarone & Warfarin (BOTH subsequently stopped)=> no longer on amiodarone and warfarin. TTE 09/10/19: EF 50-55%.  Mild-Mod MR- no MS. Stable annuloplasty ring.  Other diagnosis: Anticardiolipin antibody syndrome and  COPD.  [name] was last seen on April 14, 2020 in follow-up from echocardiogram showing stable mitral valve repair with annuloplasty ring.  Only mild MR.  No MS.  Recent Hospitalizations: None  Reviewed  CV studies:    The following studies were reviewed today: (if available, images/films reviewed: From Epic Chart or Care Everywhere) Lung Cancer Screening Chest CT 11/19/2020:: Lung RADS 3S.  Aortic atherosclerosis in addition to left main and three-vessel CAD/calcification.  Mild diffuse bronchial wall thickening with mild centrilobular and paraseptal emphysema suggesting underlying COPD.  Cardiomegaly with left atrial dilation. LCS nodule follow-up Chest CT: Lung RADS 1: Aortic atherosclerosis, coronary calcification.  Enlarged pulmonary trunk.  Upper limit of normal heart with no pericardial effusion.  Emphysema.   Interval History:   Debroah Shuttleworth returns today for delayed annual follow-up concerned about her recent chest CT findings suggesting coronary artery disease.  She is doing relatively well.  She indicates  that she will be moving to [facility] (near [place]) in the beginning of June.  She wanted to be seen prior to moving.  She has a little bit of exertional dyspnea and little bit of palpitations that probably at her baseline.  She does feel somewhat tired when she wakes up but is noncompliant with CPAP.  She may have a little bit short of breath when she lays down, but wakes up more to urinate because of dyspnea.  She is pretty deconditioned and continues to have some exertional dyspnea, no exertional chest pain.  She says she has occasional palpitations off and on but not associated with any particular chest discomfort or dyspnea.  She has exertional dyspnea and dizziness when she gets short of breath.  She feels as though she is leaning to the left.  She also described having some strange vision issues with her eye-indicates that she had cataract surgery  on the right side not yet on the right.  Therefore her right eye is what she uses for depth perception and on occasion has some blurriness in her vision.  It does not really sound consistent with amaurosis fugax.  She has some intermittent chest wall tenderness and discomfort with certain movements but not associate with exertion.  She basically takes her Lasix every day, citing that if she does not, she will get significantly dyspneic and swollen.  CV Review of Symptoms (Summary) Cardiovascular ROS: positive for - chest pain, dyspnea on exertion, palpitations, shortness of breath, and lightheadedness or dizziness associated with shortness of breath.  Mild right-sided vision issues. negative for - loss of consciousness, paroxysmal nocturnal dyspnea, rapid heart rate, or syncope or near syncope, TIA/amaurosis fugax or claudication.  REVIEWED OF SYSTEMS   Review of Systems  Constitutional:  Positive for malaise/fatigue. Negative for weight loss.  HENT:  Negative for congestion.   Respiratory:  Positive for shortness of breath (Per HPI). Negative for cough and sputum production.   Cardiovascular:  Positive for chest pain (Chest wall pain from surgery).  Gastrointestinal:  Negative for blood in stool and melena.  Genitourinary:  Positive for frequency (Nocturia). Negative for hematuria.  Musculoskeletal:  Positive for back pain, joint pain and neck pain. Negative for myalgias.  Neurological:  Positive for dizziness (When she gets short of breath) and weakness (Deconditioned.).  Psychiatric/Behavioral:  Negative for depression and memory loss. The patient is nervous/anxious.   All other systems reviewed and are negative.  I have reviewed and (if needed) personally updated the patient's problem list, medications, allergies, past medical and surgical history, social and family history.   PAST MEDICAL HISTORY   Past Medical History:  Diagnosis Date   Antiphospholipid syndrome ([place])    Anxiety     Arthritis    Chronic kidney disease (CKD), active medical management without dialysis, stage 3 (moderate) (HCC)    Clotting disorder ([place])    COPD (chronic obstructive pulmonary disease) ([place])    DES exposure in utero    Diverticulosis of colon    Dizziness    Environmental and seasonal allergies    GERD (gastroesophageal reflux disease)    H/O mitral valve prolapse-severe Barlow's type myxomatous change with severe MR 2021   First diagnosed in 2012, -> progressed to severe in 2021-now status post MVR.   Hashimoto's thyroiditis    Hiatal hernia    History of adenomatous polyp of colon    hyperplastic 2015  and tubular adenoma 2005   History of esophageal dilatation  of stricture   History of esophagitis    LA class , grade B and Gastritis   History of pituitary tumor    dx 1990's - asymptomatic /  per pt last scan 2005 approx.  resolved    Hyperlipidemia    Hypothyroidism    Leg pain, bilateral    Lupus (HCC)    Mild intermittent asthma    MTHFR mutation    OSA (obstructive sleep apnea)    non-compliant cpap   Prediabetes    S/P mitral valve repair 04/13/2019   Complex valvuloplasty including triangular resection of flail posterior leaflet + artificial Gore-tex neochords x6 and 34 mm Memo 4D ring annuloplasty   SUI (stress urinary incontinence, female)    VIN III (vulvar intraepithelial neoplasia III)    Wears glasses     PAST SURGICAL HISTORY   Past Surgical History:  Procedure Laterality Date   BUBBLE STUDY  04/10/2019   Procedure: BUBBLE STUDY;  Surgeon: Sueanne Margarita, MD;  Location: [facility];  Service: Cardiovascular;;   CARDIAC EVENT MONITOR  08/2016   Normal sinus rhythm with sinus tachycardia.  Average heart rate 91 bpm.  Occasional PVCs.  One 12 and another 6 beat run of wide-complex tachycardia.  2 morphology PVCs.PVCs noted, but not during longer episode of WCT.   CESAREAN SECTION  1994   CLIPPING OF ATRIAL APPENDAGE N/A 04/13/2019   Procedure: Clipping  Of Atrial Appendage using AtriCure 45 MM AtriClip.;  Surgeon: Rexene Alberts, MD;  Location: [facility];  Service: Open Heart Surgery;  Laterality: N/A;   CO2 LASER APPLICATION N/A 08/14/2374   Procedure: CO2 LASER APPLICATION ;  Surgeon: Everitt Amber, MD;  Location: [facility];  Service: Gynecology;  Laterality: N/A;   COLONOSCOPY, ESOPHAGOGASTRODUODENOSCOPY (EGD) AND ESOPHAGEAL DILATION  10-04-2013   LAPAROSCOPIC CHOLECYSTECTOMY  1995   LASER ABLATION CONDOLAMATA N/A 07/03/2015   Procedure: CO2 LASER OF VULVA ;  Surgeon: Everitt Amber, MD;  Location: [facility];  Service: Gynecology;  Laterality: N/A;   MITRAL VALVE REPAIR N/A 04/13/2019   Procedure: MITRAL VALVE REPAIR (MVR) using Memo 4D 34 MM Mitral Valve.;  Surgeon: Rexene Alberts, MD;  Location: [facility];  Service: Open Heart Surgery;  Laterality: N/A;   NM MYOVIEW LTD  10/2016   LOW RISK.  No ischemia or infarction.  EF 54%.   RIGHT/LEFT HEART CATH AND CORONARY ANGIOGRAPHY N/A 04/10/2019   Procedure: RIGHT/LEFT HEART CATH AND CORONARY ANGIOGRAPHY;  Surgeon: Leonie Man, MD:: Angiographically normal coronary arteries.  Moderate-severe pulmonary hypertension with severe MR.  PAP-mean 67/27 mmHg - 44 mmHg, PCWP 26/62 mmHg-(V wave 62 mmHg).  RVP-EDP 66/14 mmHg - 22 mmHg, RAP mean 20 mmHg.  AoP-MAP 96/60 mmHg - 76 mmHg.  Fick CO-CI: 5.24-2.37. --> admitted for diuresis & MVR.   TEE WITHOUT CARDIOVERSION N/A 04/10/2019   Procedure: TRANSESOPHAGEAL ECHOCARDIOGRAM (TEE);  Surgeon: Sueanne Margarita, MD;  Location: [facility] END[facility]::  EF 60 to 65%.  No R WMA.  Mildly reduced RV function.  Severe LA dilation.  Severe RA dilation.  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  Severe MR with eccentric anterior directed jet.  PV flow show systolic reversal.  Mild to moderate TR.  Mild AoV sclerosis w/o AS   TEE WITHOUT CARDIOVERSION N/A 04/13/2019   Procedure: TRANSESOPHAGEAL ECHOCARDIOGRAM (TEE);  Surgeon: Rexene Alberts, MD;   Location: [facility] OR: EF > 65% Preop-myxomatous MV with severe prolapse -flail P2 leaflet w/ ruptured chordae.  Severe MR-anterior.  Pulm V flow reversal.--> Post-OP: EF 40-50%.  No R WMA.  Mild LV dilation.  MV now repaired.  Trivial/no MR.  No MS (peak 7 mmHg, mean 15mHg).  Annular ring intact - no leak   TRANSTHORACIC ECHOCARDIOGRAM  03/2019   EF 60 to 65%.  Elevated LAP and LVEDP.  GRII DD.  Severe LA dilation and moderate RA dilation.  Severe myxomatous proliferation of posterior MV leaflet with moderate calcification.  Anterior MV is mildly thickened.  Significant MVP of posterior leaflet at least moderate if not severe MR, directed towards IAS.  Mild MS-MVG of 5 mmHg.  Dilated IVC w/o Respiratory variability - RAP ~15 mmHg   TRANSTHORACIC ECHOCARDIOGRAM  04/09/2020   Normal EF 55 to 60%.  Normal wall motion.  Mild basal septal LVH, normal atrial sizes. Unable to assess diastolic function or PAP. s/p MVR with 34 mm annuloplasty ring and artificial neocord x 6.  Mild MR with no MS (mean gradient 4 mmHg).  No AI.  Mild aortic valve sclerosis...   TRANSTHORACIC ECHOCARDIOGRAM  09/10/2019   s/p MVR:  LVEF 50-55%.  Low normal.  Mild conc LVH.  Unable to assess DFxn.  Mildly elevated PA P with mildly dilated PA.  Moderate LA dilation.  Mild-moderate MR but no MS.  Mean MVG 3.5 mmHg at 79 bpm.  Prosthetic annuloplasty ring noted.  Mild aortic sclerosis with no stenosis.    TRANSTHORACIC ECHOCARDIOGRAM  04/09/2020   1 yr Post-OP MVR:  Normal EF 55 to 60%.  No RWMA.  Mild basal septal LVH.  Unable to assess diastolic function.  Unable to assess PA P.  S/p MVR w/ 34 mm annuloplasty ring and artificial Gore-Tex neocord x6 --> Mild MR.  No MS.  Mean gradient 4 mmHg.  No AI.  Mild AoV sclerosis.    Immunization History  Administered Date(s) Administered   H1N1 11/29/2007   Influenza, High Dose Seasonal PF 12/24/2015, 11/03/2016, 11/03/2016, 10/21/2017, 10/26/2018   Influenza-Unspecified 12/24/2015, 11/03/2016,  12/04/2019   PFIZER(Purple Top)SARS-COV-2 Vaccination 05/26/2019, 06/18/2019   Pneumococcal Conjugate-13 10/02/2015, 03/24/2018   Pneumococcal Polysaccharide-23 11/08/2013   Tdap 08/09/2011   Zoster, Live 10/21/2015    MEDICATIONS/ALLERGIES   Current Meds  Medication Sig   Albuterol Sulfate (PROAIR RESPICLICK) 1976(90 Base) MCG/ACT AEPB Inhale into the lungs.   amoxicillin (AMOXIL) 500 MG capsule Take 4 capsules 1 hour prior to dental appointment   aspirin EC 81 MG tablet Take 81 mg by mouth at bedtime. Reported on 07/16/2015   atorvastatin (LIPITOR) 40 MG tablet Take 40 mg by mouth at bedtime.    azelastine (ASTELIN) 0.1 % nasal spray Place 1 spray into both nostrils 2 (two) times daily.   Cyanocobalamin (VITAMIN B-12) 5000 MCG LOZG Take 5,000 mcg by mouth daily.   FLUoxetine (PROZAC) 40 MG capsule 1 capsule   fluticasone (FLONASE) 50 MCG/ACT nasal spray Place 2 sprays into both nostrils at bedtime.    hydroxychloroquine (PLAQUENIL) 200 MG tablet 100 mg 2 (two) times daily.   levothyroxine (SYNTHROID) 125 MCG tablet Take 125 mcg by mouth daily before breakfast.   liothyronine (CYTOMEL) 5 MCG tablet Take 5 mcg by mouth daily.   metoprolol tartrate (LOPRESSOR) 25 MG tablet TAKE 1/2 TABLET BY MOUTH EVERY MORNING AND 1 TABLET BY MOUTH EVERY EVENING. MAY TAKE EXTRA DOSE IF NEEDED   Multiple Vitamin (MULTIVITAMIN WITH MINERALS) TABS tablet Take 1 tablet by mouth daily. Multivitamin Essentials   nicotine polacrilex (COMMIT) 2 MG lozenge Take 2  mg by mouth as needed for smoking cessation.   omeprazole (PRILOSEC) 40 MG capsule Take 1 capsule (40 mg total) by mouth in the morning and at bedtime.   potassium chloride (KLOR-CON) 10 MEQ tablet Take 1 tablet (10 mEq total) by mouth daily. PATIENT MUST SCHEDULE ANNUAL APPOINTMENT FOR FUTURE REFILLS. LAST ATTEMPT   [DISCONTINUED] furosemide (LASIX) 20 MG tablet Take 1 tablet (20 mg total) by mouth daily as needed.    Allergies  Allergen Reactions    Iodine Anaphylaxis   Iohexol Anaphylaxis     Desc: Patient states she is allergic to iodine and had a "code blue" incident after an injection for some type of imaging study.    Penicillins Rash    PCN IN LARGE DOSES. Did it involve swelling of the face/tongue/throat, SOB, or low BP? No Did it involve sudden or severe rash/hives, skin peeling, or any reaction on the inside of your mouth or nose? No Did you need to seek medical attention at a hospital or doctor's office? No When did it last happen? More than 10 years ago    If all above answers are "NO", may proceed with cephalosporin use.     SOCIAL HISTORY/FAMILY HISTORY   Reviewed in Epic:  Pertinent findings:  Social History   Tobacco Use   Smoking status: Former    Packs/day: 1.00    Years: 48.00    Pack years: 48.00    Types: Cigarettes    Quit date: 2017    Years since quitting: 6.3   Smokeless tobacco: Never   Tobacco comments:    using nicorrette  Vaping Use   Vaping Use: Never used  Substance Use Topics   Alcohol use: Yes    Alcohol/week: 2.0 standard drinks    Types: 2 Standard drinks or equivalent per week    Comment: Social - > is now essentially quit   Drug use: No   Social History   Social History Narrative   No routine exercise. Describes herself as "lazy "    OBJCTIVE -PE, EKG, labs   Wt Readings from Last 3 Encounters:  06/24/21 252 lb (114.3 kg)  02/18/21 250 lb (113.4 kg)  01/08/21 250 lb (113.4 kg)    Physical Exam: BP (!) 132/94   Pulse 67   Ht '5\' 8"'$  (1.727 m)   Wt 252 lb (114.3 kg)   SpO2 96%   BMI 38.32 kg/m  Physical Exam Vitals reviewed.  Constitutional:      General: She is not in acute distress.    Appearance: Normal appearance. She is obese. She is not ill-appearing or toxic-appearing.     Comments: Essentially morbidly obese with BMI of 38-39 and significant CRF.  Well-groomed.  HENT:     Head: Normocephalic and atraumatic.  Neck:     Vascular: No carotid bruit or JVD.   Cardiovascular:     Rate and Rhythm: Normal rate. Rhythm regularly irregular. Occasional Extrasystoles are present.    Chest Wall: PMI is not displaced (Unable to palpate).     Pulses: Intact distal pulses. Decreased pulses (Diminished due to mild edema and body habitus.).     Heart sounds: S1 normal and S2 normal. Heart sounds are distant. No murmur heard.   No gallop.  Pulmonary:     Effort: Pulmonary effort is normal. No respiratory distress.     Breath sounds: Normal breath sounds. No wheezing, rhonchi or rales.  Chest:     Chest wall: Tenderness (Along the right sternal  border) present.  Musculoskeletal:        General: Swelling (Trivial ankle and pedal) present. Normal range of motion.     Cervical back: Normal range of motion and neck supple.  Skin:    General: Skin is warm and dry.  Neurological:     General: No focal deficit present.     Mental Status: She is alert and oriented to person, place, and time.     Gait: Gait normal.  Psychiatric:        Mood and Affect: Mood normal.        Behavior: Behavior normal.        Thought Content: Thought content normal.        Judgment: Judgment normal.    Adult ECG Report  Rate: 67\ ;  Rhythm: normal sinus rhythm and nonspecific ST and T wave changes. ;   Narrative Interpretation: Reviewed, relatively normal.  Recent Labs: Unfortunately, labs not available. No results found for: CHOL, HDL, LDLCALC, LDLDIRECT, TRIG, CHOLHDL Lab Results  Component Value Date   CREATININE 1.15 (H) 05/10/2019   BUN 22 05/10/2019   NA 143 05/10/2019   K 4.8 05/10/2019   CL 103 05/10/2019   CO2 24 05/10/2019      Latest Ref Rng & Units 05/10/2019   11:57 AM 04/24/2019    7:30 AM 04/21/2019    5:04 AM  CBC  WBC 3.4 - 10.8 x10E3/uL 10.0   16.2   15.5    Hemoglobin 11.1 - 15.9 g/dL 9.4   8.3   9.1    Hematocrit 34.0 - 46.6 % 30.1   26.7   29.1    Platelets 150 - 450 x10E3/uL 314   199   199      Lab Results  Component Value Date   HGBA1C 6.3  (H) 04/12/2019   Lab Results  Component Value Date   TSH 5.506 (H) 04/16/2019    ==================================================  COVID-19 Education: The signs and symptoms of COVID-19 w he has been ere discussed with the patient and how to seek care for testing (follow up with PCP or arrange E-visit).    I spent a total of 32 minutes with the patient spent in direct patient consultation.  Additional time spent with chart review  / charting (studies, outside notes, etc): 23 min Total Time: 55 min  Current medicines are reviewed at length with the patient today.  (+/- concerns) N/A  This visit occurred during the SARS-CoV-2 public health emergency.  Safety protocols were in place, including screening questions prior to the visit, additional usage of staff PPE, and extensive cleaning of exam room while observing appropriate contact time as indicated for disinfecting solutions.  Notice: This dictation was prepared with Dragon dictation along with smart phrase technology. Any transcriptional errors that result from this process are unintentional and may not be corrected upon review.  Studies Ordered:   Orders Placed This Encounter  Procedures   EKG 12-Lead   Meds ordered this encounter  Medications   furosemide (LASIX) 20 MG tablet    Sig: Take 1 tablet (20 mg total) by mouth daily. May take an extra 20 mg if needed for swelling    Dispense:  100 tablet    Refill:  3    Patient Instructions / Medication Changes & Studies & Tests Ordered   Patient Instructions  Medication Instructions:    May take an extra Lasix if needed *If you need a refill on your cardiac medications before your  next appointment, please call your pharmacy*   Lab Work: Not needed    Testing/Procedures:    When you establish  with a new cardiologist they  may  want to  do echo and lower extremities venous doppler   Follow-Up: At [facility], you and your health needs are our priority.  As  part of our continuing mission to provide you with exceptional heart care, we have created designated Provider Care Teams.  These Care Teams include your primary Cardiologist (physician) and Advanced Practice Providers (APPs -  Physician Assistants and Nurse Practitioners) who all work together to provide you with the care you need, when you need it.     Your next appointment:     As needed  The format for your next appointment:   In Person  Provider:   Glenetta Hew, MD      Glenetta Hew, M.D., M.S. Interventional Cardiologist   Pager # [phone] Phone # [phone] [address]. [place], [place] [address]   Thank you for choosing Heartcare at [facility]!!

## 2021-06-24 NOTE — Assessment & Plan Note (Signed)
Seems to have relatively infrequent palpitations.  She is on relatively low-dose metoprolol which could be titrated up if necessary.  She takes 12 mg in the morning and 25 mg in the evening.  We did not titrate up further because of fatigue issues in the past, but I think she may very well be tolerate whole 25 mg twice daily.   ? ?For now just continue as is, but if palpitations increase, would titrate up. ?

## 2021-06-24 NOTE — Assessment & Plan Note (Addendum)
Not currently using CPAP--I truly think she would do a lot better if she were to use CPAP, she would have more energy and sleep better. ? ?I recommended that when she gets established in [place] but she goes forward with getting reassessed and set up with a CPAP machine. ?

## 2021-06-24 NOTE — Assessment & Plan Note (Signed)
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  Currently has an amoxicillin prescription pending. ?

## 2021-06-24 NOTE — Patient Instructions (Addendum)
Medication Instructions:  ? ? May take an extra Lasix if needed ?*If you need a refill on your cardiac medications before your next appointment, please call your pharmacy* ? ? ?Lab Work: ?Not needed ? ? ? ?Testing/Procedures: ? ?  When you establish  with a new cardiologist they  may  want to  do echo and lower extremities venous doppler  ? ?Follow-Up: ?At [facility], you and your health needs are our priority.  As part of our continuing mission to provide you with exceptional heart care, we have created designated Provider Care Teams.  These Care Teams include your primary Cardiologist (physician) and Advanced Practice Providers (APPs -  Physician Assistants and Nurse Practitioners) who all work together to provide you with the care you need, when you need it. ? ?  ? ?Your next appointment:   ?  As needed ? ?The format for your next appointment:   ?In Person ? ?Provider:   ?Glenetta Hew, MD  ? ? ?

## 2021-06-24 NOTE — Assessment & Plan Note (Addendum)
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  She has severe MR with significant CHF at the time. ?She has done remarkably well since her surgery.  Follow-up echo so shown normal functioning valve no significant MR or MS. ? ?Overall doing fairly well. ?She probably does not need a new echocardiogram at this point time with exertion of exercise and dyspnea.  I will hold off on checking an echocardiogram here as she is berry moved to [place] area and would likely be established with a cardiologist there.  I mentioned to her that the cardiologist would probably want to check an echocardiogram to have available in their system.  As such, I will hold off on having an echocardiogram checked here. ?

## 2021-06-24 NOTE — Assessment & Plan Note (Signed)
No further episodes of A-fib since surgery.  As such, we have opted to avoid DOAC. ?She is on beta-blocker and aspirin.  If she were to have recurrence of A-fib, would need to switch over to [place]. ?

## 2021-06-25 NOTE — Assessment & Plan Note (Signed)
She has now had 2 CT scans that suggest multivessel calcification.  I reassured her that she has had a negative Myoview/nonischemic and essentially normal coronary arteries by catheterization with similar findings on CT scan. ? ?I do think she needs to be aggressive with lipid management.  Unfortunately do not have the results of her recent labs. ? ?Aggressive risk factor modification, blood pressure, lipids and glycemic control.  Also increase exercise and weight loss. ? ?Plan: ?? 81 mg aspirin ?? On atorvastatin 40 mg.  Targeting LDL less than 70 ?? Continue metoprolol current dose however had low threshold to titrate up to 25 mg twice daily. ?? Monitor BP, low threshold to titrate beta-blocker up versus addition of afterload reduction ?? Continue current dose of furosemide and make a standing dose along with Klor-Con. ?

## 2021-06-25 NOTE — Assessment & Plan Note (Signed)
She had significant CHF prior to her surgery, now notably improved.  She does have edema which seems to be pretty well controlled with 20 mg of Lasix.  No real PND orthopnea although difficult to tell based on body habitus the need for CPAP. ? ?For now we will simply continue beta-blocker.  Low threshold to titrate to 20 mg twice daily.  Neck step would be to add afterload reduction with ARB. ? ?Change Lasix to standing dose with additional dose PRN.  With additional doses, she should take potassium supplementation. ?

## 2021-08-31 DIAGNOSIS — F321 Major depressive disorder, single episode, moderate: Secondary | ICD-10-CM | POA: Diagnosis not present

## 2021-08-31 DIAGNOSIS — M79605 Pain in left leg: Secondary | ICD-10-CM | POA: Diagnosis not present

## 2021-08-31 DIAGNOSIS — I129 Hypertensive chronic kidney disease with stage 1 through stage 4 chronic kidney disease, or unspecified chronic kidney disease: Secondary | ICD-10-CM | POA: Diagnosis not present

## 2021-08-31 DIAGNOSIS — E063 Autoimmune thyroiditis: Secondary | ICD-10-CM | POA: Diagnosis not present

## 2021-08-31 DIAGNOSIS — I38 Endocarditis, valve unspecified: Secondary | ICD-10-CM | POA: Diagnosis not present

## 2021-08-31 DIAGNOSIS — C512 Malignant neoplasm of clitoris: Secondary | ICD-10-CM | POA: Diagnosis not present

## 2021-08-31 DIAGNOSIS — Z1389 Encounter for screening for other disorder: Secondary | ICD-10-CM | POA: Diagnosis not present

## 2021-08-31 DIAGNOSIS — M419 Scoliosis, unspecified: Secondary | ICD-10-CM | POA: Diagnosis not present

## 2021-08-31 DIAGNOSIS — Z86010 Personal history of colonic polyps: Secondary | ICD-10-CM | POA: Diagnosis not present

## 2021-08-31 DIAGNOSIS — M329 Systemic lupus erythematosus, unspecified: Secondary | ICD-10-CM | POA: Diagnosis not present

## 2021-08-31 DIAGNOSIS — R42 Dizziness and giddiness: Secondary | ICD-10-CM | POA: Diagnosis not present

## 2021-08-31 DIAGNOSIS — N183 Chronic kidney disease, stage 3 unspecified: Secondary | ICD-10-CM | POA: Diagnosis not present

## 2021-09-04 ENCOUNTER — Telehealth: Payer: Self-pay | Admitting: Cardiology

## 2021-09-04 NOTE — Telephone Encounter (Signed)
*  STAT* If patient is at the pharmacy, call can be transferred to refill team.   1. Which medications need to be refilled? (please list name of each medication and dose if known)   FLUoxetine (PROZAC) 40 MG capsule    metoprolol tartrate (LOPRESSOR) 25 MG tablet    potassium chloride (KLOR-CON) 10 MEQ tablet   2. Which pharmacy/location (including street and city if local pharmacy) is medication to be sent to? [id number] - [place], [place] - [address]  3. Do they need a 30 day or 90 day supply?  90 day

## 2021-09-08 MED ORDER — POTASSIUM CHLORIDE ER 10 MEQ PO TBCR: 10.0000 meq | EXTENDED_RELEASE_TABLET | Freq: Every day | ORAL | 0 refills | Status: AC

## 2021-09-08 MED ORDER — METOPROLOL TARTRATE 25 MG PO TABS: ORAL_TABLET | ORAL | 3 refills | Status: AC

## 2021-10-01 DIAGNOSIS — E782 Mixed hyperlipidemia: Secondary | ICD-10-CM | POA: Diagnosis not present

## 2021-10-01 DIAGNOSIS — R7301 Impaired fasting glucose: Secondary | ICD-10-CM | POA: Diagnosis not present

## 2021-10-01 DIAGNOSIS — M549 Dorsalgia, unspecified: Secondary | ICD-10-CM | POA: Diagnosis not present

## 2021-10-01 DIAGNOSIS — E034 Atrophy of thyroid (acquired): Secondary | ICD-10-CM | POA: Diagnosis not present

## 2021-10-01 DIAGNOSIS — R296 Repeated falls: Secondary | ICD-10-CM | POA: Diagnosis not present

## 2021-10-01 DIAGNOSIS — R202 Paresthesia of skin: Secondary | ICD-10-CM | POA: Diagnosis not present

## 2021-10-01 DIAGNOSIS — I129 Hypertensive chronic kidney disease with stage 1 through stage 4 chronic kidney disease, or unspecified chronic kidney disease: Secondary | ICD-10-CM | POA: Diagnosis not present

## 2021-10-01 DIAGNOSIS — G8929 Other chronic pain: Secondary | ICD-10-CM | POA: Diagnosis not present

## 2021-10-01 DIAGNOSIS — N183 Chronic kidney disease, stage 3 unspecified: Secondary | ICD-10-CM | POA: Diagnosis not present

## 2021-10-13 ENCOUNTER — Encounter: Payer: Self-pay | Admitting: Cardiology

## 2021-10-23 DIAGNOSIS — S6992XA Unspecified injury of left wrist, hand and finger(s), initial encounter: Secondary | ICD-10-CM | POA: Diagnosis not present

## 2021-10-23 DIAGNOSIS — M1612 Unilateral primary osteoarthritis, left hip: Secondary | ICD-10-CM | POA: Diagnosis not present

## 2021-10-23 DIAGNOSIS — S59202A Unspecified physeal fracture of lower end of radius, left arm, initial encounter for closed fracture: Secondary | ICD-10-CM | POA: Diagnosis not present

## 2021-10-23 DIAGNOSIS — N183 Chronic kidney disease, stage 3 unspecified: Secondary | ICD-10-CM | POA: Diagnosis not present

## 2021-10-23 DIAGNOSIS — M160 Bilateral primary osteoarthritis of hip: Secondary | ICD-10-CM | POA: Diagnosis not present

## 2021-10-23 DIAGNOSIS — Z79890 Hormone replacement therapy (postmenopausal): Secondary | ICD-10-CM | POA: Diagnosis not present

## 2021-10-23 DIAGNOSIS — S79912A Unspecified injury of left hip, initial encounter: Secondary | ICD-10-CM | POA: Diagnosis not present

## 2021-10-23 DIAGNOSIS — S3993XA Unspecified injury of pelvis, initial encounter: Secondary | ICD-10-CM | POA: Diagnosis not present

## 2021-10-23 DIAGNOSIS — S6292XA Unspecified fracture of left wrist and hand, initial encounter for closed fracture: Secondary | ICD-10-CM | POA: Diagnosis not present

## 2021-10-23 DIAGNOSIS — Z7982 Long term (current) use of aspirin: Secondary | ICD-10-CM | POA: Diagnosis not present

## 2021-10-23 DIAGNOSIS — E782 Mixed hyperlipidemia: Secondary | ICD-10-CM | POA: Diagnosis not present

## 2021-10-23 DIAGNOSIS — I129 Hypertensive chronic kidney disease with stage 1 through stage 4 chronic kidney disease, or unspecified chronic kidney disease: Secondary | ICD-10-CM | POA: Diagnosis not present

## 2021-10-23 DIAGNOSIS — S79911A Unspecified injury of right hip, initial encounter: Secondary | ICD-10-CM | POA: Diagnosis not present

## 2021-10-23 DIAGNOSIS — E063 Autoimmune thyroiditis: Secondary | ICD-10-CM | POA: Diagnosis not present

## 2021-10-29 DIAGNOSIS — G8929 Other chronic pain: Secondary | ICD-10-CM | POA: Diagnosis not present

## 2021-10-29 DIAGNOSIS — G609 Hereditary and idiopathic neuropathy, unspecified: Secondary | ICD-10-CM | POA: Diagnosis not present

## 2021-10-29 DIAGNOSIS — R200 Anesthesia of skin: Secondary | ICD-10-CM | POA: Diagnosis not present

## 2021-10-29 DIAGNOSIS — M5441 Lumbago with sciatica, right side: Secondary | ICD-10-CM | POA: Diagnosis not present

## 2021-11-03 DIAGNOSIS — S3993XA Unspecified injury of pelvis, initial encounter: Secondary | ICD-10-CM | POA: Diagnosis not present

## 2021-11-03 DIAGNOSIS — M545 Low back pain, unspecified: Secondary | ICD-10-CM | POA: Diagnosis not present

## 2021-11-03 DIAGNOSIS — S52392D Other fracture of shaft of radius, left arm, subsequent encounter for closed fracture with routine healing: Secondary | ICD-10-CM | POA: Diagnosis not present

## 2021-11-03 DIAGNOSIS — M160 Bilateral primary osteoarthritis of hip: Secondary | ICD-10-CM | POA: Diagnosis not present

## 2021-11-03 DIAGNOSIS — M4316 Spondylolisthesis, lumbar region: Secondary | ICD-10-CM | POA: Diagnosis not present

## 2021-11-03 DIAGNOSIS — M47816 Spondylosis without myelopathy or radiculopathy, lumbar region: Secondary | ICD-10-CM | POA: Diagnosis not present

## 2021-11-03 DIAGNOSIS — S52532A Colles' fracture of left radius, initial encounter for closed fracture: Secondary | ICD-10-CM | POA: Diagnosis not present

## 2021-11-03 DIAGNOSIS — S59202A Unspecified physeal fracture of lower end of radius, left arm, initial encounter for closed fracture: Secondary | ICD-10-CM | POA: Diagnosis not present

## 2021-11-03 DIAGNOSIS — S79919A Unspecified injury of unspecified hip, initial encounter: Secondary | ICD-10-CM | POA: Diagnosis not present

## 2021-11-13 DIAGNOSIS — M4726 Other spondylosis with radiculopathy, lumbar region: Secondary | ICD-10-CM | POA: Diagnosis not present

## 2021-11-13 DIAGNOSIS — G8929 Other chronic pain: Secondary | ICD-10-CM | POA: Diagnosis not present

## 2021-11-13 DIAGNOSIS — S32050A Wedge compression fracture of fifth lumbar vertebra, initial encounter for closed fracture: Secondary | ICD-10-CM | POA: Diagnosis not present

## 2021-11-13 DIAGNOSIS — M4856XA Collapsed vertebra, not elsewhere classified, lumbar region, initial encounter for fracture: Secondary | ICD-10-CM | POA: Diagnosis not present

## 2021-11-18 DIAGNOSIS — G629 Polyneuropathy, unspecified: Secondary | ICD-10-CM | POA: Diagnosis not present

## 2021-11-25 DIAGNOSIS — M8008XA Age-related osteoporosis with current pathological fracture, vertebra(e), initial encounter for fracture: Secondary | ICD-10-CM | POA: Diagnosis not present

## 2021-11-25 DIAGNOSIS — M4856XA Collapsed vertebra, not elsewhere classified, lumbar region, initial encounter for fracture: Secondary | ICD-10-CM | POA: Diagnosis not present

## 2021-12-07 DIAGNOSIS — Z23 Encounter for immunization: Secondary | ICD-10-CM | POA: Diagnosis not present

## 2021-12-10 DIAGNOSIS — S52502D Unspecified fracture of the lower end of left radius, subsequent encounter for closed fracture with routine healing: Secondary | ICD-10-CM | POA: Diagnosis not present

## 2021-12-10 DIAGNOSIS — S52532A Colles' fracture of left radius, initial encounter for closed fracture: Secondary | ICD-10-CM | POA: Diagnosis not present

## 2021-12-16 DIAGNOSIS — E039 Hypothyroidism, unspecified: Secondary | ICD-10-CM | POA: Diagnosis not present

## 2021-12-16 DIAGNOSIS — E7800 Pure hypercholesterolemia, unspecified: Secondary | ICD-10-CM | POA: Diagnosis not present

## 2021-12-16 DIAGNOSIS — M329 Systemic lupus erythematosus, unspecified: Secondary | ICD-10-CM | POA: Diagnosis not present

## 2021-12-16 DIAGNOSIS — N183 Chronic kidney disease, stage 3 unspecified: Secondary | ICD-10-CM | POA: Diagnosis not present

## 2021-12-16 DIAGNOSIS — F3342 Major depressive disorder, recurrent, in full remission: Secondary | ICD-10-CM | POA: Diagnosis not present

## 2021-12-16 DIAGNOSIS — K219 Gastro-esophageal reflux disease without esophagitis: Secondary | ICD-10-CM | POA: Diagnosis not present

## 2021-12-29 DIAGNOSIS — M8008XD Age-related osteoporosis with current pathological fracture, vertebra(e), subsequent encounter for fracture with routine healing: Secondary | ICD-10-CM | POA: Diagnosis not present

## 2022-01-07 DIAGNOSIS — S52532D Colles' fracture of left radius, subsequent encounter for closed fracture with routine healing: Secondary | ICD-10-CM | POA: Diagnosis not present

## 2022-01-13 DIAGNOSIS — R7989 Other specified abnormal findings of blood chemistry: Secondary | ICD-10-CM | POA: Diagnosis not present

## 2022-01-13 DIAGNOSIS — G609 Hereditary and idiopathic neuropathy, unspecified: Secondary | ICD-10-CM | POA: Diagnosis not present

## 2022-01-13 DIAGNOSIS — Z87898 Personal history of other specified conditions: Secondary | ICD-10-CM | POA: Diagnosis not present

## 2022-01-13 DIAGNOSIS — R251 Tremor, unspecified: Secondary | ICD-10-CM | POA: Diagnosis not present

## 2022-01-18 DIAGNOSIS — Z780 Asymptomatic menopausal state: Secondary | ICD-10-CM | POA: Diagnosis not present

## 2022-01-18 DIAGNOSIS — Z87310 Personal history of (healed) osteoporosis fracture: Secondary | ICD-10-CM | POA: Diagnosis not present

## 2022-01-18 DIAGNOSIS — E559 Vitamin D deficiency, unspecified: Secondary | ICD-10-CM | POA: Diagnosis not present

## 2022-01-18 DIAGNOSIS — Z8262 Family history of osteoporosis: Secondary | ICD-10-CM | POA: Diagnosis not present

## 2022-01-18 DIAGNOSIS — M810 Age-related osteoporosis without current pathological fracture: Secondary | ICD-10-CM | POA: Diagnosis not present

## 2022-02-02 DIAGNOSIS — D497 Neoplasm of unspecified behavior of endocrine glands and other parts of nervous system: Secondary | ICD-10-CM | POA: Diagnosis not present

## 2022-02-04 DIAGNOSIS — G9389 Other specified disorders of brain: Secondary | ICD-10-CM | POA: Diagnosis not present

## 2022-05-28 ENCOUNTER — Ambulatory Visit: Payer: Medicare Other
